# Patient Record
Sex: Female | Born: 1935
Health system: Southern US, Community
[De-identification: ages and names within clinical notes are randomized; demographics above are authoritative.]

## PROBLEM LIST (undated history)

## (undated) DIAGNOSIS — I4891 Unspecified atrial fibrillation: Secondary | ICD-10-CM

## (undated) DIAGNOSIS — M1990 Unspecified osteoarthritis, unspecified site: Secondary | ICD-10-CM

## (undated) DIAGNOSIS — I519 Heart disease, unspecified: Secondary | ICD-10-CM

## (undated) DIAGNOSIS — Z66 Do not resuscitate: Secondary | ICD-10-CM

## (undated) DIAGNOSIS — E119 Type 2 diabetes mellitus without complications: Secondary | ICD-10-CM

## (undated) DIAGNOSIS — R0600 Dyspnea, unspecified: Secondary | ICD-10-CM

## (undated) DIAGNOSIS — I2510 Atherosclerotic heart disease of native coronary artery without angina pectoris: Secondary | ICD-10-CM

## (undated) DIAGNOSIS — E039 Hypothyroidism, unspecified: Secondary | ICD-10-CM

## (undated) DIAGNOSIS — N189 Chronic kidney disease, unspecified: Secondary | ICD-10-CM

## (undated) DIAGNOSIS — K5792 Diverticulitis of intestine, part unspecified, without perforation or abscess without bleeding: Secondary | ICD-10-CM

## (undated) DIAGNOSIS — I499 Cardiac arrhythmia, unspecified: Secondary | ICD-10-CM

## (undated) DIAGNOSIS — I219 Acute myocardial infarction, unspecified: Secondary | ICD-10-CM

## (undated) DIAGNOSIS — I509 Heart failure, unspecified: Secondary | ICD-10-CM

## (undated) DIAGNOSIS — I10 Essential (primary) hypertension: Secondary | ICD-10-CM

## (undated) DIAGNOSIS — E079 Disorder of thyroid, unspecified: Secondary | ICD-10-CM

## (undated) DIAGNOSIS — Z515 Encounter for palliative care: Secondary | ICD-10-CM

## (undated) DIAGNOSIS — I480 Paroxysmal atrial fibrillation: Secondary | ICD-10-CM

## (undated) HISTORY — PX: TONSILLECTOMY: SUR1361

## (undated) HISTORY — DX: Heart failure, unspecified: I50.9

## (undated) HISTORY — DX: Diverticulitis of intestine, part unspecified, without perforation or abscess without bleeding: K57.92

## (undated) HISTORY — DX: Do not resuscitate: Z66

## (undated) HISTORY — DX: Encounter for palliative care: Z51.5

## (undated) HISTORY — DX: Type 2 diabetes mellitus without complications: E11.9

## (undated) HISTORY — DX: Essential (primary) hypertension: I10

## (undated) HISTORY — DX: Chronic kidney disease, unspecified: N18.9

## (undated) HISTORY — DX: Dyspnea, unspecified: R06.00

## (undated) HISTORY — DX: Disorder of thyroid, unspecified: E07.9

## (undated) HISTORY — DX: Unspecified atrial fibrillation: I48.91

## (undated) HISTORY — DX: Unspecified osteoarthritis, unspecified site: M19.90

---
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
## 2018-08-09 ENCOUNTER — Ambulatory Visit: Payer: Self-pay | Admitting: Family Medicine

## 2018-08-16 ENCOUNTER — Ambulatory Visit (INDEPENDENT_AMBULATORY_CARE_PROVIDER_SITE_OTHER): Payer: Medicare Other | Admitting: Family Medicine

## 2018-08-16 ENCOUNTER — Other Ambulatory Visit: Payer: Self-pay

## 2018-08-16 ENCOUNTER — Encounter: Payer: Self-pay | Admitting: General Practice

## 2018-08-16 ENCOUNTER — Encounter: Payer: Self-pay | Admitting: Family Medicine

## 2018-08-16 DIAGNOSIS — E785 Hyperlipidemia, unspecified: Secondary | ICD-10-CM

## 2018-08-16 DIAGNOSIS — I10 Essential (primary) hypertension: Secondary | ICD-10-CM

## 2018-08-16 DIAGNOSIS — I2510 Atherosclerotic heart disease of native coronary artery without angina pectoris: Secondary | ICD-10-CM

## 2018-08-16 DIAGNOSIS — E1159 Type 2 diabetes mellitus with other circulatory complications: Secondary | ICD-10-CM | POA: Insufficient documentation

## 2018-08-16 DIAGNOSIS — I252 Old myocardial infarction: Secondary | ICD-10-CM

## 2018-08-16 DIAGNOSIS — E1169 Type 2 diabetes mellitus with other specified complication: Secondary | ICD-10-CM

## 2018-08-16 DIAGNOSIS — E039 Hypothyroidism, unspecified: Secondary | ICD-10-CM

## 2018-08-16 HISTORY — DX: Hypothyroidism, unspecified: E03.9

## 2018-08-16 HISTORY — DX: Essential (primary) hypertension: I10

## 2018-08-16 HISTORY — DX: Old myocardial infarction: I25.2

## 2018-08-16 HISTORY — DX: Atherosclerotic heart disease of native coronary artery without angina pectoris: I25.10

## 2018-08-16 HISTORY — DX: Type 2 diabetes mellitus with other specified complication: E11.69

## 2018-08-16 HISTORY — DX: Type 2 diabetes mellitus with other circulatory complications: E11.59

## 2018-08-16 LAB — CBC WITH DIFFERENTIAL/PLATELET
Basophils Absolute: 0.1 10*3/uL (ref 0.0–0.1)
Basophils Relative: 0.8 % (ref 0.0–3.0)
Eosinophils Absolute: 0.2 10*3/uL (ref 0.0–0.7)
Eosinophils Relative: 3.1 % (ref 0.0–5.0)
HCT: 32 % — ABNORMAL LOW (ref 36.0–46.0)
Hemoglobin: 10.6 g/dL — ABNORMAL LOW (ref 12.0–15.0)
Lymphocytes Relative: 22.6 % (ref 12.0–46.0)
Lymphs Abs: 1.6 10*3/uL (ref 0.7–4.0)
MCHC: 33.2 g/dL (ref 30.0–36.0)
MCV: 91.6 fl (ref 78.0–100.0)
Monocytes Absolute: 0.6 10*3/uL (ref 0.1–1.0)
Monocytes Relative: 8.8 % (ref 3.0–12.0)
Neutro Abs: 4.6 10*3/uL (ref 1.4–7.7)
Neutrophils Relative %: 64.7 % (ref 43.0–77.0)
Platelets: 264 10*3/uL (ref 150.0–400.0)
RBC: 3.49 Mil/uL — ABNORMAL LOW (ref 3.87–5.11)
RDW: 13.4 % (ref 11.5–15.5)
WBC: 7.1 10*3/uL (ref 4.0–10.5)

## 2018-08-16 LAB — TSH: TSH: 2.18 u[IU]/mL (ref 0.35–4.50)

## 2018-08-16 LAB — HEMOGLOBIN A1C: Hgb A1c MFr Bld: 5.6 % (ref 4.6–6.5)

## 2018-08-16 NOTE — Progress Notes (Signed)
   Subjective:    Patient ID: Lauren Walker, female    DOB: 1929/10/12, 85 y.o.   MRN: [id number]  HPI New to establish.  In process of relocating from [name].  Hyperlipidemia- chronic problem, on Atorvastatin 40, Fenofibrate 160mg  daily.  Denies abd pain, N/V.  Hypothyroid- chronic problem, on Levothyroxine 163mcg daily.  Denies excessive fatigue, changes to skin/hair/nails.  DM- chronic problem, on Glimepiride 0.5 mg daily.  Last A1C was '5.6 or 5.7'.  Last done in March. No visual changes.  Denies symptomatic lows.  No numbness/tingling of hands/feet.  On ACE for renal protection.  UTD on eye exam (November)  HTN- chronic problem, on Coreg 6.25mg  BID, Lasix 20mg  daily, Hydralazine 50mg  BID, Imdur 30mg  daily, Quinapril 10mg  BID  Pt reports she has had multiple issues w/ finding the right combination of pills.  Denies CP, SOB above baseline, HAs, edema.  Hx of MI- pt is on ASA and eliquis.  On beta blocker, statin, ACE, Lasix.  Pt needs local Cards.   Review of Systems For ROS see HPI     Objective:   Physical Exam Vitals signs reviewed.  Constitutional:      General: She is not in acute distress.    Appearance: She is well-developed. She is obese.  HENT:     Head: Normocephalic and atraumatic.  Eyes:     Conjunctiva/sclera: Conjunctivae normal.     Pupils: Pupils are equal, round, and reactive to light.  Neck:     Musculoskeletal: Normal range of motion and neck supple.     Thyroid: No thyromegaly.  Cardiovascular:     Rate and Rhythm: Normal rate and regular rhythm.     Heart sounds: No murmur.     Comments: Distant heart sounds Pulmonary:     Effort: Pulmonary effort is normal. No respiratory distress.     Breath sounds: Normal breath sounds.  Abdominal:     General: There is no distension.     Palpations: Abdomen is soft.     Tenderness: There is no abdominal tenderness.  Musculoskeletal:     Right lower leg: Edema (1+ pitting edema) present.     Left lower leg: Edema  (1+ pitting edema) present.  Lymphadenopathy:     Cervical: No cervical adenopathy.  Skin:    General: Skin is warm and dry.  Neurological:     Mental Status: She is alert and oriented to person, place, and time.  Psychiatric:        Behavior: Behavior normal.           Assessment & Plan:

## 2018-08-16 NOTE — Assessment & Plan Note (Signed)
New to provider, ongoing for pt.  BP is adequately controlled today given her advanced age.  Check labs.  No anticipated med changes.  Will follow.

## 2018-08-16 NOTE — Patient Instructions (Signed)
Follow up in 3-4 months to recheck diabetes We'll notify you of your lab results and make any changes if needed No med changes at this time Continue to work on healthy diet- you can do it! We'll call you with your cardiology appt Call with any questions or concerns Welcome!  We're glad to have you! Stay Safe!!!

## 2018-08-16 NOTE — Assessment & Plan Note (Signed)
Chronic problem.  Pt is doing well on Lipitor 40mg  and Fenofibrate 160mg  daily.  Currently asymptomatic.  Check labs.  Adjust meds prn

## 2018-08-16 NOTE — Assessment & Plan Note (Signed)
New to provider, ongoing for pt.  Pt reports this is well controlled on 0.5mg  Glimepiride daily.  In fact, based on her reported A1C, she may not require meds at all.  UTD on eye exam.  On ACE for renal protection.  Foot exam done today.  Check labs.  Adjust meds prn

## 2018-08-16 NOTE — Assessment & Plan Note (Signed)
New to provider, ongoing for pt.  S/P MI.  On ASA and Eliquis.  On beta blocker and statin.  Needs local cards since she is relocating from [place].  Referral placed.  Pt currently asymptomatic.

## 2018-08-16 NOTE — Assessment & Plan Note (Signed)
New to provider, ongoing for pt.  Currently asymptomatic.  Check labs.  Adjust meds prn

## 2018-08-16 NOTE — Assessment & Plan Note (Signed)
New.  Pt is on ASA and Eliquis.  On beta blocker and statin.  Refer to local cards.  Pt expressed understanding and is in agreement w/ plan.

## 2018-08-17 LAB — BASIC METABOLIC PANEL
BUN: 55 mg/dL — ABNORMAL HIGH (ref 6–23)
CO2: 27 mEq/L (ref 19–32)
Calcium: 8.8 mg/dL (ref 8.4–10.5)
Chloride: 102 mEq/L (ref 96–112)
Creatinine, Ser: 2.23 mg/dL — ABNORMAL HIGH (ref 0.40–1.20)
GFR: 20.77 mL/min — ABNORMAL LOW (ref >60.00)
Glucose, Bld: 95 mg/dL (ref 70–99)
Potassium: 4.3 mEq/L (ref 3.5–5.1)
Sodium: 139 mEq/L (ref 135–145)

## 2018-08-17 LAB — HEPATIC FUNCTION PANEL
ALT: 12 U/L (ref 0–35)
AST: 19 U/L (ref 0–37)
Albumin: 3.6 g/dL (ref 3.5–5.2)
Alkaline Phosphatase: 20 U/L — ABNORMAL LOW (ref 39–117)
Bilirubin, Direct: 0.1 mg/dL (ref 0.0–0.3)
Total Bilirubin: 0.3 mg/dL (ref 0.2–1.2)
Total Protein: 5.8 g/dL — ABNORMAL LOW (ref 6.0–8.3)

## 2018-08-17 LAB — LIPID PANEL
Cholesterol: 107 mg/dL (ref 0–200)
HDL: 43 mg/dL (ref >39.00)
LDL Cholesterol: 40 mg/dL (ref 0–99)
NonHDL: 64.03
Total CHOL/HDL Ratio: 2
Triglycerides: 122 mg/dL (ref 0.0–149.0)
VLDL: 24.4 mg/dL (ref 0.0–40.0)

## 2018-08-17 NOTE — Progress Notes (Signed)
Called pt and lmovm to return call.    [place] for [facility] to Discuss results / PCP recommendations / Schedule patient.

## 2018-08-24 ENCOUNTER — Encounter: Payer: Self-pay | Admitting: General Practice

## 2018-08-24 ENCOUNTER — Other Ambulatory Visit: Payer: Self-pay | Admitting: Family Medicine

## 2018-08-24 DIAGNOSIS — R799 Abnormal finding of blood chemistry, unspecified: Secondary | ICD-10-CM

## 2018-08-24 DIAGNOSIS — R7989 Other specified abnormal findings of blood chemistry: Secondary | ICD-10-CM

## 2018-10-11 ENCOUNTER — Encounter: Payer: Self-pay | Admitting: Cardiology

## 2018-10-11 ENCOUNTER — Other Ambulatory Visit: Payer: Self-pay

## 2018-10-11 ENCOUNTER — Ambulatory Visit (INDEPENDENT_AMBULATORY_CARE_PROVIDER_SITE_OTHER): Payer: Medicare Other | Admitting: Cardiology

## 2018-10-11 VITALS — BP 130/74 | HR 55 | Ht 64.0 in | Wt 177.0 lb

## 2018-10-11 DIAGNOSIS — E1159 Type 2 diabetes mellitus with other circulatory complications: Secondary | ICD-10-CM | POA: Diagnosis not present

## 2018-10-11 DIAGNOSIS — I2510 Atherosclerotic heart disease of native coronary artery without angina pectoris: Secondary | ICD-10-CM | POA: Diagnosis not present

## 2018-10-11 DIAGNOSIS — I252 Old myocardial infarction: Secondary | ICD-10-CM

## 2018-10-11 DIAGNOSIS — I10 Essential (primary) hypertension: Secondary | ICD-10-CM | POA: Diagnosis not present

## 2018-10-11 DIAGNOSIS — I480 Paroxysmal atrial fibrillation: Secondary | ICD-10-CM

## 2018-10-11 NOTE — Progress Notes (Signed)
Cardiology Consultation:    Date:  10/11/2018   ID:  Lauren Walker, DOB March 05, 1929, MRN [id number]  PCP:  Midge Minium, MD  Cardiologist:  Jenne Campus, MD   Referring MD: Midge Minium, MD   Chief Complaint  Patient presents with  . History of MI    12/2014  . Coronary Artery Disease  . Edema    Bilateral legs, going on for years    History of Present Illness:    Lauren Walker is a 85 y.o. female who is being seen today for the evaluation of history of MI at the request of Midge Minium, MD.  In 2016 she went to vault for election she started having heartburn eventually few days later she went to her primary care physician she was told to have a heart attack, she was transferred to the hospital after that cardiac catheterization was done.  She was told to have occlusion of the 1 of the artery going to the bottom of his heart with collateralization.  No intervention was done since that time she seems to be doing fine however, she complained of having swelling of lower extremities.  No more chest pain/heartburn, does have some exertional shortness of breath.  Recently she relocated from [place] to area here.  She does have a dog to try to walk her dog on a regular basis.  She was also identified with kidney dysfunction.  She also some problem the kidney but now creatinine is more than quit smoking 30 years ago.  Does have hypertension does have diabetes as well as dyslipidemia.  Past Medical History:  Diagnosis Date  . Arrhythmia   . Arthritis   . Chronic kidney disease   . Diabetes mellitus without complication ([place])   . Diverticulitis   . Hypertension   . Thyroid disease     Past Surgical History:  Procedure Laterality Date  . APPENDECTOMY  1936    Current Medications: Current Meds  Medication Sig  . acetaminophen (TYLENOL) 650 MG CR tablet Take 650 mg by mouth every 8 (eight) hours as needed for pain.  Marland Kitchen apixaban (ELIQUIS) 2.5 MG TABS tablet  Take 2.5 mg by mouth 2 (two) times daily.  Marland Kitchen aspirin EC 81 MG tablet Take 81 mg by mouth daily.  Marland Kitchen atorvastatin (LIPITOR) 40 MG tablet Take 40 mg by mouth daily.  . Azelastine-Fluticasone 137-50 MCG/ACT SUSP Place 1 spray into the nose 2 (two) times daily as needed.  Marland Kitchen b complex vitamins capsule Take 1 capsule by mouth daily.  . Biotin 1 MG CAPS Take by mouth.  . carvedilol (COREG) 12.5 MG tablet Take 12.5 mg by mouth 2 (two) times daily with a meal.  . Cholecalciferol (VITAMIN D3) 50 MCG (2000 UT) TABS Take 1 capsule by mouth daily.  . fenofibrate 160 MG tablet Take 160 mg by mouth daily.  . Ferrous Sulfate (IRON) 325 (65 Fe) MG TABS Take 1 tablet by mouth daily.  . furosemide (LASIX) 20 MG tablet Take 20 mg by mouth.  . hydrALAZINE (APRESOLINE) 50 MG tablet Take 50 mg by mouth 2 (two) times a day.  . isosorbide mononitrate (IMDUR) 30 MG 24 hr tablet Take 30 mg by mouth daily.  Marland Kitchen levothyroxine (SYNTHROID) 150 MCG tablet Take 150 mcg by mouth daily before breakfast.  . magnesium oxide (MAG-OX) 400 MG tablet Take 400 mg by mouth daily.  . nitroGLYCERIN (NITROSTAT) 0.4 MG SL tablet Place 0.4 mg under the tongue every 5 (five) minutes  as needed for chest pain.  Marland Kitchen quinapril (ACCUPRIL) 10 MG tablet Take 10 mg by mouth 2 (two) times daily.  . vitamin C (ASCORBIC ACID) 500 MG tablet Take 500 mg by mouth daily.     Allergies:   Tape   Social History   Socioeconomic History  . Marital status: Married    Spouse name: Not on file  . Number of children: Not on file  . Years of education: Not on file  . Highest education level: Not on file  Occupational History  . Not on file  Social Needs  . Financial resource strain: Not on file  . Food insecurity    Worry: Not on file    Inability: Not on file  . Transportation needs    Medical: Not on file    Non-medical: Not on file  Tobacco Use  . Smoking status: Former Research scientist (life sciences)  . Smokeless tobacco: Never Used  Substance and Sexual Activity  .  Alcohol use: Yes    Comment: Very rare  . Drug use: Never  . Sexual activity: Not Currently  Lifestyle  . Physical activity    Days per week: Not on file    Minutes per session: Not on file  . Stress: Not on file  Relationships  . Social Herbalist on phone: Not on file    Gets together: Not on file    Attends religious service: Not on file    Active member of club or organization: Not on file    Attends meetings of clubs or organizations: Not on file    Relationship status: Not on file  Other Topics Concern  . Not on file  Social History Narrative  . Not on file     Family History: The patient's family history includes Arthritis in her daughter, father, mother, and son; Depression in her maternal aunt; Heart attack in her father; Heart disease in her father and mother; Hyperlipidemia in her maternal aunt; Hypertension in her maternal aunt and mother. ROS:   Please see the history of present illness.    All 14 point review of systems negative except as described per history of present illness.  EKGs/Labs/Other Studies Reviewed:    The following studies were reviewed today:     Recent Labs: 08/16/2018: ALT 12; BUN 55; Creatinine, Ser 2.23; Hemoglobin 10.6; Platelets 264.0; Potassium 4.3; Sodium 139; TSH 2.18  Recent Lipid Panel    Component Value Date/Time   CHOL 107 08/16/2018 1214   TRIG 122.0 08/16/2018 1214   HDL 43.00 08/16/2018 1214   CHOLHDL 2 08/16/2018 1214   VLDL 24.4 08/16/2018 1214   LDLCALC 40 08/16/2018 1214    Physical Exam:    VS:  BP 130/74   Pulse (!) 55   Ht 5\' 4"  (1.626 m)   Wt 177 lb (80.3 kg)   SpO2 98%   BMI 30.38 kg/m     Wt Readings from Last 3 Encounters:  10/11/18 177 lb (80.3 kg)  08/16/18 182 lb (82.6 kg)     GEN:  Well nourished, well developed in no acute distress HEENT: Normal NECK: No JVD; No carotid bruits LYMPHATICS: No lymphadenopathy CARDIAC: RRR, systolic ejection murmur grade 1/6 to 2/6 best heard at  the right upper portion of the sternum as well as holosystolic murmur best heard left border of the sternum grade 1/6 to 2/6., no rubs, no gallops RESPIRATORY:  Clear to auscultation without rales, wheezing or rhonchi  ABDOMEN: Soft, non-tender, non-distended  MUSCULOSKELETAL:  No edema; No deformity  SKIN: Warm and dry NEUROLOGIC:  Alert and oriented x 3 PSYCHIATRIC:  Normal affect   ASSESSMENT:    1. Coronary artery disease involving native coronary artery of native heart without angina pectoris   2. Essential hypertension   3. History of MI (myocardial infarction)   4. Type 2 diabetes mellitus with other circulatory complications (HCC)   5. Paroxysmal atrial fibrillation (HCC)    PLAN:    In order of problems listed above:  1. Coronary disease status post myocardial infarction 2016.  Will get EKG today, will do echocardiogram to assess left ventricle ejection fraction.  She is on appropriate medication which I will continue and change for now.  She is on aspirin.  And if she has no recent coronary event within last year aspirin is not indicated.  Will wait with decision regarding aspirin after we get echocardiogram.  I will not change any of her medication otherwise. 2. Essential hypertension blood pressure well controlled continue present management. 3. Type 2 diabetes recent hemoglobin A1c was less than 6.  Medication has been modified.  She is doing well. 4. Paroxysmal atrial fibrillation described to have rare palpitations.  She is anticoagulated which I will continue her chads 2 Vascor equals 4.  I will ask her to have an echocardiogram done to look also of the atrial size.  Based on that we will decide if we need to pursue a monitor to decide if we need to have some antiarrhythmic therapy.  However, overall she is not having any palpitations. 5. Heart murmur it looks like she does have aortic stenosis as well as mitral regurgitation.  Echocardiogram will be done to assess that    Medication Adjustments/Labs and Tests Ordered: Current medicines are reviewed at length with the patient today.  Concerns regarding medicines are outlined above.  No orders of the defined types were placed in this encounter.  No orders of the defined types were placed in this encounter.   Signed, Park Liter, MD, [facility]. 10/11/2018 11:06 AM    [place]

## 2018-10-11 NOTE — Patient Instructions (Signed)
Medication Instructions:  Your physician recommends that you continue on your current medications as directed. Please refer to the Current Medication list given to you today.  If you need a refill on your cardiac medications before your next appointment, please call your pharmacy.   Lab work: None.  If you have labs (blood work) drawn today and your tests are completely normal, you will receive your results only by: Marland Kitchen MyChart Message (if you have MyChart) OR . A paper copy in the mail If you have any lab test that is abnormal or we need to change your treatment, we will call you to review the results.  Testing/Procedures: Your physician has requested that you have an echocardiogram. Echocardiography is a painless test that uses sound waves to create images of your heart. It provides your doctor with information about the size and shape of your heart and how well your heart's chambers and valves are working. This procedure takes approximately one hour. There are no restrictions for this procedure.    Follow-Up: At [facility], you and your health needs are our priority.  As part of our continuing mission to provide you with exceptional heart care, we have created designated Provider Care Teams.  These Care Teams include your primary Cardiologist (physician) and Advanced Practice Providers (APPs -  Physician Assistants and Nurse Practitioners) who all work together to provide you with the care you need, when you need it. You will need a follow up appointment in 1 months.  Please call our office 2 months in advance to schedule this appointment.  You may see No primary care provider on file. or another member of our [employer] Provider Team in [place]: Shirlee More, MD . Jyl Heinz, MD  Any Other Special Instructions Will Be Listed Below (If Applicable).   Echocardiogram An echocardiogram is a procedure that uses painless sound waves (ultrasound) to produce an image of the  heart. Images from an echocardiogram can provide important information about:  Signs of coronary artery disease (CAD).  Aneurysm detection. An aneurysm is a weak or damaged part of an artery wall that bulges out from the normal force of blood pumping through the body.  Heart size and shape. Changes in the size or shape of the heart can be associated with certain conditions, including heart failure, aneurysm, and CAD.  Heart muscle function.  Heart valve function.  Signs of a past heart attack.  Fluid buildup around the heart.  Thickening of the heart muscle.  A tumor or infectious growth around the heart valves. Tell a health care provider about:  Any allergies you have.  All medicines you are taking, including vitamins, herbs, eye drops, creams, and over-the-counter medicines.  Any blood disorders you have.  Any surgeries you have had.  Any medical conditions you have.  Whether you are pregnant or may be pregnant. What are the risks? Generally, this is a safe procedure. However, problems may occur, including:  Allergic reaction to dye (contrast) that may be used during the procedure. What happens before the procedure? No specific preparation is needed. You may eat and drink normally. What happens during the procedure?   An IV tube may be inserted into one of your veins.  You may receive contrast through this tube. A contrast is an injection that improves the quality of the pictures from your heart.  A gel will be applied to your chest.  A wand-like tool (transducer) will be moved over your chest. The gel will help  to transmit the sound waves from the transducer.  The sound waves will harmlessly bounce off of your heart to allow the heart images to be captured in real-time motion. The images will be recorded on a computer. The procedure may vary among health care providers and hospitals. What happens after the procedure?  You may return to your normal, everyday  life, including diet, activities, and medicines, unless your health care provider tells you not to do that. Summary  An echocardiogram is a procedure that uses painless sound waves (ultrasound) to produce an image of the heart.  Images from an echocardiogram can provide important information about the size and shape of your heart, heart muscle function, heart valve function, and fluid buildup around your heart.  You do not need to do anything to prepare before this procedure. You may eat and drink normally.  After the echocardiogram is completed, you may return to your normal, everyday life, unless your health care provider tells you not to do that. This information is not intended to replace advice given to you by your health care provider. Make sure you discuss any questions you have with your health care provider. Document Released: 02/01/2000 Document Revised: 05/27/2018 Document Reviewed: 03/08/2016 Elsevier Patient Education  2020 [employer].  '

## 2018-10-12 ENCOUNTER — Ambulatory Visit (HOSPITAL_BASED_OUTPATIENT_CLINIC_OR_DEPARTMENT_OTHER)
Admission: RE | Admit: 2018-10-12 | Discharge: 2018-10-12 | Disposition: A | Discharge status: Home / Self Care | Payer: Medicare Other | Source: Ambulatory Visit | Attending: Cardiology | Admitting: Cardiology

## 2018-10-12 DIAGNOSIS — I480 Paroxysmal atrial fibrillation: Secondary | ICD-10-CM | POA: Insufficient documentation

## 2018-10-12 DIAGNOSIS — I252 Old myocardial infarction: Secondary | ICD-10-CM | POA: Diagnosis not present

## 2018-10-12 DIAGNOSIS — I2510 Atherosclerotic heart disease of native coronary artery without angina pectoris: Secondary | ICD-10-CM | POA: Insufficient documentation

## 2018-10-12 NOTE — Progress Notes (Signed)
  Echocardiogram 2D Echocardiogram has been performed.  Lauren Walker 10/12/2018, 11:55 AM

## 2018-11-04 ENCOUNTER — Ambulatory Visit (INDEPENDENT_AMBULATORY_CARE_PROVIDER_SITE_OTHER): Payer: Medicare Other | Admitting: Cardiology

## 2018-11-04 ENCOUNTER — Encounter: Payer: Self-pay | Admitting: Cardiology

## 2018-11-04 ENCOUNTER — Other Ambulatory Visit: Payer: Self-pay | Admitting: Nephrology

## 2018-11-04 ENCOUNTER — Other Ambulatory Visit: Payer: Self-pay

## 2018-11-04 VITALS — BP 170/62 | HR 63 | Ht 64.0 in | Wt 178.0 lb

## 2018-11-04 DIAGNOSIS — I340 Nonrheumatic mitral (valve) insufficiency: Secondary | ICD-10-CM

## 2018-11-04 DIAGNOSIS — I480 Paroxysmal atrial fibrillation: Secondary | ICD-10-CM | POA: Diagnosis not present

## 2018-11-04 DIAGNOSIS — I10 Essential (primary) hypertension: Secondary | ICD-10-CM | POA: Diagnosis not present

## 2018-11-04 DIAGNOSIS — N184 Chronic kidney disease, stage 4 (severe): Secondary | ICD-10-CM

## 2018-11-04 DIAGNOSIS — I2510 Atherosclerotic heart disease of native coronary artery without angina pectoris: Secondary | ICD-10-CM

## 2018-11-04 HISTORY — DX: Nonrheumatic mitral (valve) insufficiency: I34.0

## 2018-11-04 MED ORDER — HYDRALAZINE HCL 50 MG PO TABS: 50.0000 mg | ORAL_TABLET | Freq: Three times a day (TID) | ORAL | 2 refills | Status: DC

## 2018-11-04 NOTE — Patient Instructions (Signed)
Medication Instructions:  Your physician has recommended you make the following change in your medication:   Increase: Hydralazine to 50 mg three times daily.    If you need a refill on your cardiac medications before your next appointment, please call your pharmacy.   Lab work: None.  If you have labs (blood work) drawn today and your tests are completely normal, you will receive your results only by: Marland Kitchen MyChart Message (if you have MyChart) OR . A paper copy in the mail If you have any lab test that is abnormal or we need to change your treatment, we will call you to review the results.  Testing/Procedures: None  Follow-Up: At [facility], you and your health needs are our priority.  As part of our continuing mission to provide you with exceptional heart care, we have created designated Provider Care Teams.  These Care Teams include your primary Cardiologist (physician) and Advanced Practice Providers (APPs -  Physician Assistants and Nurse Practitioners) who all work together to provide you with the care you need, when you need it. You will need a follow up appointment in 3 months.  Please call our office 2 months in advance to schedule this appointment.  You may see No primary care provider on file. or another member of our [employer] Provider Team in [place]: Shirlee More, MD . Jyl Heinz, MD  Any Other Special Instructions Will Be Listed Below (If Applicable).

## 2018-11-04 NOTE — Progress Notes (Signed)
Cardiology Office Note:    Date:  11/04/2018   ID:  Lauren Walker, DOB May 04, 1929, MRN [id number]  PCP:  Midge Minium, MD  Cardiologist:  Jenne Campus, MD    Referring MD: Midge Minium, MD   Chief Complaint  Patient presents with  . Follow-up  Doing well  History of Present Illness:    Lauren Walker is a 85 y.o. female with history of coronary artery disease apparently years ago she did have a small non-STEMI cardiac catheterization was done she was told to have completely occluded artery with good collateralization no intervention has been done.  She is originally from [place].  She relocated to our area recently.  She would like to be established as a patient.  On last physical examination 2 heart murmurs were heard 1 aortic stenosis, mitral regurgitation.  Echocardiogram being done and she is following up today on results of this test.  Echocardiogram showed preserved left ventricle ejection fraction, she does have moderate to severe mitral regurgitation, also mild aortic stenosis.  Overall she seems to be doing well denies having any chest pain tightness squeezing pressure burning chest she does have a dog that she walks on a regular basis she has no difficulty doing it.  Past Medical History:  Diagnosis Date  . Arrhythmia   . Arthritis   . Chronic kidney disease   . Diabetes mellitus without complication ([place])   . Diverticulitis   . Hypertension   . Thyroid disease     Past Surgical History:  Procedure Laterality Date  . APPENDECTOMY  1936    Current Medications: Current Meds  Medication Sig  . acetaminophen (TYLENOL) 650 MG CR tablet Take 650 mg by mouth every 8 (eight) hours as needed for pain.  Marland Kitchen apixaban (ELIQUIS) 2.5 MG TABS tablet Take 2.5 mg by mouth 2 (two) times daily.  Marland Kitchen aspirin EC 81 MG tablet Take 81 mg by mouth daily.  Marland Kitchen atorvastatin (LIPITOR) 40 MG tablet Take 40 mg by mouth daily.  . Azelastine-Fluticasone 137-50 MCG/ACT SUSP Place 1  spray into the nose 2 (two) times daily as needed.  Marland Kitchen b complex vitamins capsule Take 1 capsule by mouth daily.  . Biotin 1 MG CAPS Take by mouth.  . carvedilol (COREG) 12.5 MG tablet Take 12.5 mg by mouth 2 (two) times daily with a meal.  . Cholecalciferol (VITAMIN D3) 50 MCG (2000 UT) TABS Take 1 capsule by mouth daily.  . fenofibrate 160 MG tablet Take 160 mg by mouth daily.  . Ferrous Sulfate (IRON) 325 (65 Fe) MG TABS Take 1 tablet by mouth daily.  . furosemide (LASIX) 20 MG tablet Take 20 mg by mouth.  . hydrALAZINE (APRESOLINE) 50 MG tablet Take 50 mg by mouth 2 (two) times a day.  . isosorbide mononitrate (IMDUR) 30 MG 24 hr tablet Take 30 mg by mouth daily.  Marland Kitchen levothyroxine (SYNTHROID) 150 MCG tablet Take 150 mcg by mouth daily before breakfast.  . magnesium oxide (MAG-OX) 400 MG tablet Take 400 mg by mouth daily.  . nitroGLYCERIN (NITROSTAT) 0.4 MG SL tablet Place 0.4 mg under the tongue every 5 (five) minutes as needed for chest pain.  Marland Kitchen quinapril (ACCUPRIL) 10 MG tablet Take 10 mg by mouth 2 (two) times daily.  . vitamin C (ASCORBIC ACID) 500 MG tablet Take 500 mg by mouth daily.     Allergies:   Tape   Social History   Socioeconomic History  . Marital status: Married  Spouse name: Not on file  . Number of children: Not on file  . Years of education: Not on file  . Highest education level: Not on file  Occupational History  . Not on file  Social Needs  . Financial resource strain: Not on file  . Food insecurity    Worry: Not on file    Inability: Not on file  . Transportation needs    Medical: Not on file    Non-medical: Not on file  Tobacco Use  . Smoking status: Former Research scientist (life sciences)  . Smokeless tobacco: Never Used  Substance and Sexual Activity  . Alcohol use: Yes    Comment: Very rare  . Drug use: Never  . Sexual activity: Not Currently  Lifestyle  . Physical activity    Days per week: Not on file    Minutes per session: Not on file  . Stress: Not on file   Relationships  . Social Herbalist on phone: Not on file    Gets together: Not on file    Attends religious service: Not on file    Active member of club or organization: Not on file    Attends meetings of clubs or organizations: Not on file    Relationship status: Not on file  Other Topics Concern  . Not on file  Social History Narrative  . Not on file     Family History: The patient's family history includes Arthritis in her daughter, father, mother, and son; Depression in her maternal aunt; Heart attack in her father; Heart disease in her father and mother; Hyperlipidemia in her maternal aunt; Hypertension in her maternal aunt and mother. ROS:   Please see the history of present illness.    All 14 point review of systems negative except as described per history of present illness  EKGs/Labs/Other Studies Reviewed:      Recent Labs: 08/16/2018: ALT 12; BUN 55; Creatinine, Ser 2.23; Hemoglobin 10.6; Platelets 264.0; Potassium 4.3; Sodium 139; TSH 2.18  Recent Lipid Panel    Component Value Date/Time   CHOL 107 08/16/2018 1214   TRIG 122.0 08/16/2018 1214   HDL 43.00 08/16/2018 1214   CHOLHDL 2 08/16/2018 1214   VLDL 24.4 08/16/2018 1214   LDLCALC 40 08/16/2018 1214    Physical Exam:    VS:  BP (!) 150/66   Pulse 63   Ht 5\' 4"  (1.626 m)   Wt 178 lb (80.7 kg)   SpO2 98%   BMI 30.55 kg/m     Wt Readings from Last 3 Encounters:  11/04/18 178 lb (80.7 kg)  10/11/18 177 lb (80.3 kg)  08/16/18 182 lb (82.6 kg)     GEN:  Well nourished, well developed in no acute distress HEENT: Normal NECK: No JVD; No carotid bruits LYMPHATICS: No lymphadenopathy CARDIAC: RRR, systolic ejection murmur grade 2/6 best heard at the right upper portion of the sternum, also holosystolic murmur grade 3/6 best heard at the apex, no rubs, no gallops RESPIRATORY:  Clear to auscultation without rales, wheezing or rhonchi  ABDOMEN: Soft, non-tender, non-distended MUSCULOSKELETAL:   No edema; No deformity  SKIN: Warm and dry LOWER EXTREMITIES: no swelling NEUROLOGIC:  Alert and oriented x 3 PSYCHIATRIC:  Normal affect   ASSESSMENT:    1. Coronary artery disease involving native coronary artery of native heart without angina pectoris   2. Essential hypertension   3. Paroxysmal atrial fibrillation (HCC)   4. Nonrheumatic mitral valve regurgitation    PLAN:  In order of problems listed above:  1. Coronary disease stable denies having any symptoms.  Echocardiogram showed preserved left ventricular ejection fraction. 2. Essential hypertension I will increase dose of hydralazine to 50 mg 3 times a day hoping that I will be able to reduce her blood pressure and reduce also mitral regurgitation. 3. Paroxysmal atrial fibrillation denies having any palpitations.  She is anticoagulated will continue. 4. Nonrheumatic mitral valve regurgitation which assessed to be moderate to severe.  She is shocked with those findings we had a long discussion about what to do with the situation she is reluctant to do any aggressive approach for now.  Therefore we will simply watch this carefully.  I see her back in my office in about 3 months at that time she may require another echocardiogram.   Medication Adjustments/Labs and Tests Ordered: Current medicines are reviewed at length with the patient today.  Concerns regarding medicines are outlined above.  No orders of the defined types were placed in this encounter.  Medication changes: No orders of the defined types were placed in this encounter.   Signed, Park Liter, MD, [facility] 11/04/2018 11:45 AM    [place]

## 2018-11-05 ENCOUNTER — Telehealth: Payer: Self-pay | Admitting: Family Medicine

## 2018-11-05 NOTE — Telephone Encounter (Signed)
Yes.  They need to get their Tdap from the pharmacy

## 2018-11-05 NOTE — Telephone Encounter (Signed)
Due to age and medicare injection needs to come from pharmacy correct

## 2018-11-05 NOTE — Telephone Encounter (Signed)
Pt has a new great grand baby she wanted to know if Dr. Birdie Riddle recommended she get the whooping cough vaccine. Pt is scheduled for her flu shot on Monday and she can be reached at the home #

## 2018-11-05 NOTE — Telephone Encounter (Signed)
Pt informed that she needs to get Tdap from pharmacy. Stated an understanding.

## 2018-11-08 ENCOUNTER — Ambulatory Visit (INDEPENDENT_AMBULATORY_CARE_PROVIDER_SITE_OTHER): Payer: Medicare Other

## 2018-11-08 ENCOUNTER — Other Ambulatory Visit: Payer: Self-pay

## 2018-11-08 DIAGNOSIS — Z23 Encounter for immunization: Secondary | ICD-10-CM | POA: Diagnosis not present

## 2018-11-12 ENCOUNTER — Telehealth: Payer: Self-pay | Admitting: Family Medicine

## 2018-11-12 MED ORDER — FLUTICASONE PROPIONATE 50 MCG/ACT NA SUSP: 2.0000 | Freq: Every day | NASAL | 6 refills | Status: DC

## 2018-11-12 NOTE — Telephone Encounter (Signed)
Medication filled to pharmacy as requested.  Pt was informed.

## 2018-11-12 NOTE — Telephone Encounter (Signed)
Ok for [facility]- 2 sprays each nostril daily.  Disp 1, 11 refills

## 2018-11-12 NOTE — Telephone Encounter (Signed)
Please advise 

## 2018-11-12 NOTE — Telephone Encounter (Signed)
Pt called in stating that she and Dr. Birdie Riddle talked about changing her nasal spray from the Azelastine Fluticasone to something else. Pt uses [facility] at [address]. Pt can be reached at the home #

## 2018-11-15 ENCOUNTER — Telehealth: Payer: Self-pay | Admitting: Family Medicine

## 2018-11-15 DIAGNOSIS — H269 Unspecified cataract: Secondary | ICD-10-CM

## 2018-11-15 NOTE — Addendum Note (Signed)
Addended by: Davis Gourd on: 11/15/2018 10:09 AM   Modules accepted: Orders

## 2018-11-15 NOTE — Telephone Encounter (Signed)
[facility] for referral.  No preference at this time

## 2018-11-15 NOTE — Telephone Encounter (Signed)
Patient would like a referral to a ophthalmologist for her Cataracts. Patient is new to the area.

## 2018-11-15 NOTE — Telephone Encounter (Signed)
This referral was placed today.  

## 2018-11-15 NOTE — Telephone Encounter (Signed)
[facility] for referral? And do you have a provider preference

## 2018-11-18 ENCOUNTER — Other Ambulatory Visit: Payer: Self-pay | Admitting: Family Medicine

## 2018-11-18 DIAGNOSIS — H269 Unspecified cataract: Secondary | ICD-10-CM

## 2018-11-23 ENCOUNTER — Ambulatory Visit
Admission: RE | Admit: 2018-11-23 | Discharge: 2018-11-23 | Disposition: A | Discharge status: Home / Self Care | Payer: Medicare Other | Source: Ambulatory Visit | Attending: Nephrology | Admitting: Nephrology

## 2018-11-23 DIAGNOSIS — N184 Chronic kidney disease, stage 4 (severe): Secondary | ICD-10-CM

## 2018-12-04 ENCOUNTER — Encounter (HOSPITAL_COMMUNITY)
Admission: RE | Disposition: A | Discharge status: Home / Self Care | Payer: Self-pay | Source: Ambulatory Visit | Attending: Ophthalmology

## 2018-12-04 ENCOUNTER — Inpatient Hospital Stay (HOSPITAL_COMMUNITY): Payer: Medicare Other | Admitting: Anesthesiology

## 2018-12-04 ENCOUNTER — Other Ambulatory Visit (HOSPITAL_COMMUNITY): Payer: Self-pay | Admitting: *Deleted

## 2018-12-04 ENCOUNTER — Ambulatory Visit (HOSPITAL_COMMUNITY)
Admission: RE | Admit: 2018-12-04 | Discharge: 2018-12-04 | Disposition: A | Discharge status: Home / Self Care | Payer: Medicare Other | Source: Ambulatory Visit | Attending: Ophthalmology | Admitting: Ophthalmology

## 2018-12-04 ENCOUNTER — Other Ambulatory Visit (HOSPITAL_COMMUNITY)
Admission: RE | Admit: 2018-12-04 | Discharge: 2018-12-04 | Disposition: A | Discharge status: Home / Self Care | Payer: Medicare Other | Source: Ambulatory Visit | Attending: Ophthalmology | Admitting: Ophthalmology

## 2018-12-04 ENCOUNTER — Encounter (HOSPITAL_COMMUNITY): Payer: Self-pay | Admitting: Anesthesiology

## 2018-12-04 DIAGNOSIS — I129 Hypertensive chronic kidney disease with stage 1 through stage 4 chronic kidney disease, or unspecified chronic kidney disease: Secondary | ICD-10-CM | POA: Insufficient documentation

## 2018-12-04 DIAGNOSIS — I252 Old myocardial infarction: Secondary | ICD-10-CM | POA: Insufficient documentation

## 2018-12-04 DIAGNOSIS — H4311 Vitreous hemorrhage, right eye: Secondary | ICD-10-CM | POA: Insufficient documentation

## 2018-12-04 DIAGNOSIS — E1122 Type 2 diabetes mellitus with diabetic chronic kidney disease: Secondary | ICD-10-CM | POA: Diagnosis not present

## 2018-12-04 DIAGNOSIS — N189 Chronic kidney disease, unspecified: Secondary | ICD-10-CM | POA: Diagnosis not present

## 2018-12-04 DIAGNOSIS — H3321 Serous retinal detachment, right eye: Secondary | ICD-10-CM | POA: Diagnosis not present

## 2018-12-04 DIAGNOSIS — I2510 Atherosclerotic heart disease of native coronary artery without angina pectoris: Secondary | ICD-10-CM | POA: Insufficient documentation

## 2018-12-04 DIAGNOSIS — Z794 Long term (current) use of insulin: Secondary | ICD-10-CM | POA: Insufficient documentation

## 2018-12-04 DIAGNOSIS — Z87891 Personal history of nicotine dependence: Secondary | ICD-10-CM | POA: Insufficient documentation

## 2018-12-04 DIAGNOSIS — Z20828 Contact with and (suspected) exposure to other viral communicable diseases: Secondary | ICD-10-CM | POA: Insufficient documentation

## 2018-12-04 HISTORY — DX: Acute myocardial infarction, unspecified: I21.9

## 2018-12-04 HISTORY — PX: PARS PLANA VITRECTOMY: SHX2166

## 2018-12-04 HISTORY — DX: Atherosclerotic heart disease of native coronary artery without angina pectoris: I25.10

## 2018-12-04 HISTORY — DX: Hypothyroidism, unspecified: E03.9

## 2018-12-04 LAB — POCT I-STAT, CHEM 8
BUN: 36 mg/dL — ABNORMAL HIGH (ref 8–23)
Calcium, Ion: 1.16 mmol/L (ref 1.15–1.40)
Chloride: 104 mmol/L (ref 98–111)
Creatinine, Ser: 2 mg/dL — ABNORMAL HIGH (ref 0.44–1.00)
Glucose, Bld: 141 mg/dL — ABNORMAL HIGH (ref 70–99)
HCT: 33 % — ABNORMAL LOW (ref 36.0–46.0)
Hemoglobin: 11.2 g/dL — ABNORMAL LOW (ref 12.0–15.0)
Potassium: 4.4 mmol/L (ref 3.5–5.1)
Sodium: 141 mmol/L (ref 135–145)
TCO2: 25 mmol/L (ref 22–32)

## 2018-12-04 LAB — SARS CORONAVIRUS 2 (TAT 6-24 HRS): SARS Coronavirus 2: NEGATIVE

## 2018-12-04 LAB — GLUCOSE, CAPILLARY: Glucose-Capillary: 135 mg/dL — ABNORMAL HIGH (ref 70–99)

## 2018-12-04 SURGERY — PARS PLANA VITRECTOMY 25 GAUGE FOR ENDOPHTHALMITIS
Anesthesia: Monitor Anesthesia Care | Site: Eye | Laterality: Right

## 2018-12-04 MED ORDER — BSS IO SOLN
INTRAOCULAR | Status: DC | PRN
  Administered 2018-12-04: 15 mL

## 2018-12-04 MED ORDER — FENTANYL CITRATE (PF) 250 MCG/5ML IJ SOLN
INTRAMUSCULAR | Status: AC
  Filled 2018-12-04: qty 5

## 2018-12-04 MED ORDER — TOBRAMYCIN-DEXAMETHASONE 0.3-0.1 % OP OINT
TOPICAL_OINTMENT | OPHTHALMIC | Status: DC | PRN
  Administered 2018-12-04: 1 via OPHTHALMIC

## 2018-12-04 MED ORDER — HYPROMELLOSE (GONIOSCOPIC) 2.5 % OP SOLN
OPHTHALMIC | Status: DC | PRN
  Administered 2018-12-04: 1 [drp] via OPHTHALMIC

## 2018-12-04 MED ORDER — CEFAZOLIN SUBCONJUNCTIVAL INJECTION 100 MG/0.5 ML
100.0000 mg | INJECTION | SUBCONJUNCTIVAL | Status: DC
  Filled 2018-12-04: qty 5

## 2018-12-04 MED ORDER — EPINEPHRINE PF 1 MG/ML IJ SOLN
INTRAOCULAR | Status: DC | PRN
  Administered 2018-12-04: .3 mL

## 2018-12-04 MED ORDER — LABETALOL HCL 5 MG/ML IV SOLN
INTRAVENOUS | Status: AC
  Filled 2018-12-04: qty 4

## 2018-12-04 MED ORDER — FENTANYL CITRATE (PF) 100 MCG/2ML IJ SOLN
INTRAMUSCULAR | Status: DC | PRN
  Administered 2018-12-04: 25 ug via INTRAVENOUS

## 2018-12-04 MED ORDER — LABETALOL HCL 5 MG/ML IV SOLN: 10.0000 mg | INTRAVENOUS | Status: DC | PRN

## 2018-12-04 MED ORDER — HYDROMORPHONE HCL 1 MG/ML IJ SOLN: 0.2500 mg | INTRAMUSCULAR | Status: DC | PRN

## 2018-12-04 MED ORDER — PROPOFOL 10 MG/ML IV BOLUS
INTRAVENOUS | Status: AC
  Filled 2018-12-04: qty 20

## 2018-12-04 MED ORDER — LABETALOL HCL 5 MG/ML IV SOLN
10.0000 mg | Freq: Once | INTRAVENOUS | Status: AC
  Administered 2018-12-04: 10 mg via INTRAVENOUS
  Filled 2018-12-04 (×2): qty 4

## 2018-12-04 MED ORDER — PHENYLEPHRINE HCL 2.5 % OP SOLN
1.0000 [drp] | OPHTHALMIC | Status: AC | PRN
  Administered 2018-12-04 (×3): 1 [drp] via OPHTHALMIC
  Filled 2018-12-04: qty 2

## 2018-12-04 MED ORDER — PROPARACAINE HCL 0.5 % OP SOLN
1.0000 [drp] | OPHTHALMIC | Status: AC | PRN
  Administered 2018-12-04 (×2): 1 [drp] via OPHTHALMIC
  Filled 2018-12-04: qty 15

## 2018-12-04 MED ORDER — ONDANSETRON HCL 4 MG/2ML IJ SOLN: 4.0000 mg | Freq: Once | INTRAMUSCULAR | Status: DC | PRN

## 2018-12-04 MED ORDER — DEXAMETHASONE SODIUM PHOSPHATE 10 MG/ML IJ SOLN
INTRAMUSCULAR | Status: AC
  Filled 2018-12-04: qty 1

## 2018-12-04 MED ORDER — GATIFLOXACIN 0.5 % OP SOLN
1.0000 [drp] | OPHTHALMIC | Status: AC | PRN
  Administered 2018-12-04 (×3): 1 [drp] via OPHTHALMIC
  Filled 2018-12-04: qty 2.5

## 2018-12-04 MED ORDER — PROPOFOL 10 MG/ML IV BOLUS
INTRAVENOUS | Status: DC | PRN
  Administered 2018-12-04: 30 mg via INTRAVENOUS

## 2018-12-04 MED ORDER — DEXAMETHASONE SODIUM PHOSPHATE 10 MG/ML IJ SOLN
INTRAMUSCULAR | Status: DC | PRN
  Administered 2018-12-04: 10 mg

## 2018-12-04 MED ORDER — ONDANSETRON HCL 4 MG/2ML IJ SOLN
INTRAMUSCULAR | Status: DC | PRN
  Administered 2018-12-04: 4 mg via INTRAVENOUS

## 2018-12-04 MED ORDER — CYCLOPENTOLATE HCL 1 % OP SOLN
1.0000 [drp] | OPHTHALMIC | Status: AC | PRN
  Administered 2018-12-04 (×3): 1 [drp] via OPHTHALMIC
  Filled 2018-12-04: qty 2

## 2018-12-04 MED ORDER — LABETALOL HCL 5 MG/ML IV SOLN
INTRAVENOUS | Status: DC | PRN
  Administered 2018-12-04 (×2): 5 mg via INTRAVENOUS

## 2018-12-04 MED ORDER — LIDOCAINE HCL 2 % IJ SOLN
INTRAMUSCULAR | Status: DC | PRN
  Administered 2018-12-04: 6 mL via RETROBULBAR

## 2018-12-04 MED ORDER — LIDOCAINE HCL (CARDIAC) PF 100 MG/5ML IV SOSY
PREFILLED_SYRINGE | INTRAVENOUS | Status: DC | PRN
  Administered 2018-12-04: 30 mg via INTRATRACHEAL

## 2018-12-04 MED ORDER — LABETALOL HCL 5 MG/ML IV SOLN
5.0000 mg | INTRAVENOUS | Status: DC | PRN
  Administered 2018-12-04: 5 mg via INTRAVENOUS

## 2018-12-04 MED ORDER — CEFAZOLIN SUBCONJUNCTIVAL INJECTION 100 MG/0.5 ML
INJECTION | SUBCONJUNCTIVAL | Status: DC | PRN
  Administered 2018-12-04: 100 mg via SUBCONJUNCTIVAL

## 2018-12-04 MED ORDER — HYDRALAZINE HCL 50 MG PO TABS
50.0000 mg | ORAL_TABLET | ORAL | Status: AC
  Administered 2018-12-04: 50 mg via ORAL
  Filled 2018-12-04: qty 1

## 2018-12-04 MED ORDER — MEPERIDINE HCL 25 MG/ML IJ SOLN: 6.2500 mg | INTRAMUSCULAR | Status: DC | PRN

## 2018-12-04 MED ORDER — SODIUM CHLORIDE 0.9 % IV SOLN
INTRAVENOUS | Status: DC
  Administered 2018-12-04 (×2): via INTRAVENOUS

## 2018-12-04 SURGICAL SUPPLY — 59 items
APPLICATOR COTTON TIP 6 STRL (MISCELLANEOUS) ×1 IMPLANT
APPLICATOR COTTON TIP 6IN STRL (MISCELLANEOUS) ×3
BAND WRIST GAS GREEN (MISCELLANEOUS) IMPLANT
BLADE MVR KNIFE 20G (BLADE) IMPLANT
CANNULA ANT CHAM MAIN (OPHTHALMIC RELATED) IMPLANT
CANNULA DUAL BORE 23G (CANNULA) IMPLANT
CANNULA DUALBORE 25G (CANNULA) IMPLANT
CANNULA VLV SOFT TIP 25GA (OPHTHALMIC) ×3 IMPLANT
CAUTERY EYE LOW TEMP 1300F FIN (OPHTHALMIC RELATED) IMPLANT
CLOSURE STERI-STRIP 1/2X4 (GAUZE/BANDAGES/DRESSINGS) ×1
CLSR STERI-STRIP ANTIMIC 1/2X4 (GAUZE/BANDAGES/DRESSINGS) ×2 IMPLANT
CORD BIPOLAR FORCEPS 12FT (ELECTRODE) IMPLANT
COVER MAYO STAND STRL (DRAPES) IMPLANT
COVER WAND RF STERILE (DRAPES) ×3 IMPLANT
DRAPE HALF SHEET 40X57 (DRAPES) ×3 IMPLANT
DRAPE INCISE 51X51 W/FILM STRL (DRAPES) IMPLANT
DRAPE RETRACTOR (MISCELLANEOUS) ×3 IMPLANT
ERASER HMR WETFIELD 23G BP (MISCELLANEOUS) IMPLANT
FORCEPS ECKARDT ILM 25G SERR (OPHTHALMIC RELATED) IMPLANT
FORCEPS GRIESHABER ILM 25G A (INSTRUMENTS) IMPLANT
GAS AUTO FILL CONSTEL (OPHTHALMIC) ×3
GAS AUTO FILL CONSTELLATION (OPHTHALMIC) ×1 IMPLANT
GAS WRIST BAND GREEN (MISCELLANEOUS)
GLOVE SURG SYN 7.5  E (GLOVE) ×4
GLOVE SURG SYN 7.5 E (GLOVE) ×2 IMPLANT
GOWN STRL REUS W/ TWL LRG LVL3 (GOWN DISPOSABLE) ×3 IMPLANT
GOWN STRL REUS W/TWL LRG LVL3 (GOWN DISPOSABLE) ×6
HANDLE PNEUMATIC FOR CONSTEL (OPHTHALMIC) IMPLANT
KIT BASIN OR (CUSTOM PROCEDURE TRAY) ×3 IMPLANT
KIT TURNOVER KIT B (KITS) ×3 IMPLANT
LENS BIOM SUPER VIEW SET DISP (MISCELLANEOUS) ×3 IMPLANT
MICROPICK 25G (MISCELLANEOUS)
NEEDLE 18GX1X1/2 (RX/OR ONLY) (NEEDLE) ×3 IMPLANT
NEEDLE 25GX 5/8IN NON SAFETY (NEEDLE) ×3 IMPLANT
NEEDLE FILTER BLUNT 18X 1/2SAF (NEEDLE) ×6
NEEDLE FILTER BLUNT 18X1 1/2 (NEEDLE) ×3 IMPLANT
NEEDLE HYPO 25GX1X1/2 BEV (NEEDLE) IMPLANT
NEEDLE HYPO 30X.5 LL (NEEDLE) ×12 IMPLANT
NEEDLE RETROBULBAR 25GX1.5 (NEEDLE) IMPLANT
NS IRRIG 1000ML POUR BTL (IV SOLUTION) ×3 IMPLANT
PACK VITRECTOMY CUSTOM (CUSTOM PROCEDURE TRAY) ×3 IMPLANT
PAD ARMBOARD 7.5X6 YLW CONV (MISCELLANEOUS) ×6 IMPLANT
PAK PIK VITRECTOMY CVS 25GA (OPHTHALMIC) ×3 IMPLANT
PENCIL BIPOLAR 25GA STR DISP (OPHTHALMIC RELATED) IMPLANT
PICK MICROPICK 25G (MISCELLANEOUS) IMPLANT
PROBE LASER ILLUM FLEX CVD 25G (OPHTHALMIC) IMPLANT
ROLLS DENTAL (MISCELLANEOUS) IMPLANT
SCRAPER DIAMOND 25GA (OPHTHALMIC RELATED) IMPLANT
SOL ANTI FOG 6CC (MISCELLANEOUS) ×1 IMPLANT
SOLUTION ANTI FOG 6CC (MISCELLANEOUS) ×2
STOPCOCK 4 WAY LG BORE MALE ST (IV SETS) IMPLANT
SUT VICRYL 7 0 TG140 8 (SUTURE) ×3 IMPLANT
SYR 10ML LL (SYRINGE) ×3 IMPLANT
SYR 20ML LL LF (SYRINGE) ×3 IMPLANT
SYR 5ML LL (SYRINGE) ×3 IMPLANT
SYR TB 1ML LUER SLIP (SYRINGE) ×9 IMPLANT
TOWEL GREEN STERILE FF (TOWEL DISPOSABLE) ×3 IMPLANT
VITCUTTER PED ULTRA 25G SHORT (OPHTHALMIC) ×3 IMPLANT
WATER STERILE IRR 1000ML POUR (IV SOLUTION) ×3 IMPLANT

## 2018-12-04 NOTE — Anesthesia Procedure Notes (Signed)
Procedure Name: MAC Date/Time: 12/04/2018 4:31 PM Performed by: Eligha Bridegroom, CRNA Pre-anesthesia Checklist: Patient identified, Emergency Drugs available, Suction available, Patient being monitored and Timeout performed Patient Re-evaluated:Patient Re-evaluated prior to induction Oxygen Delivery Method: Nasal cannula Preoxygenation: Pre-oxygenation with 100% oxygen

## 2018-12-04 NOTE — Transfer of Care (Signed)
Immediate Anesthesia Transfer of Care Note  Patient: Lauren Walker  Procedure(s) Performed: RETINAL DETACHMENT REPAIR PPV 25 GAUGE WITH ENDO LASER AIR/FLUID EXCHANGE SF6 GAS INJECTION (Right Eye)  Patient Location: PACU  Anesthesia Type:MAC  Level of Consciousness: awake, alert  and oriented  Airway & Oxygen Therapy: Patient Spontanous Breathing  Post-op Assessment: Report given to RN and Post -op Vital signs reviewed and stable  Post vital signs: Reviewed and stable  Last Vitals:  Vitals Value Taken Time  BP 207/91 12/04/18 1743  Temp    Pulse 65 12/04/18 1751  Resp 14 12/04/18 1751  SpO2 96 % 12/04/18 1751  Vitals shown include unvalidated device data.  Last Pain:  Vitals:   12/04/18 1522  TempSrc: Oral  PainSc: 0-No pain      Patients Stated Pain Goal: 3 (95/18/84 1660)  Complications: No apparent anesthesia complications

## 2018-12-04 NOTE — Discharge Instructions (Signed)
DO NOT SLEEP ON BACK, THE EYE PRESSURE CAN GO UP AND CAUSE VISION LOSS   SLEEP ON SIDE WITH NOSE TO PILLOW  DURING DAY KEEP FACE DOWN.  15 MINUTES EVERY 2 HOURS IT IS OK TO LOOK STRAIGHT AHEAD (USE BATHROOM, EAT, WALK, ETC.)  

## 2018-12-04 NOTE — Op Note (Signed)
Lilla Callejo 12/04/2018 Diagnosis: Retinal detachment with vitreous hemorrhage right eye  Procedure: [place] Vitrectomy, Endolaser, Fluid Gas Exchange and SF6 gas tamponade for Retinal Detachment repair right eye Operative Eye:  right eye  Surgeon: Royston Cowper Estimated Blood Loss: minimal Specimens for Pathology:  None Complications: none   The  patient was prepped and draped in the usual fashion for ocular surgery on the  right eye .  A lid speculum was placed.  Infusion line and trocar was placed at the 8 o'clock position approximately 3.5 mm from the surgical limbus.   The infusion line was allowed to run and then clamped when placed at the cannula opening. The line was inserted and secured to the drape with an adhesive strip.   Active trocars/cannula were placed at the 10 and 2 o'clock positions approximately 3.5 mm from the surgical limbus. The cannula was visualized in the vitreous cavity.  The light pipe and vitreous cutter were inserted into the vitreous cavity and a core vitrectomy was performed.  Care taken to remove the vitreous up to the vitreous base for 360 degrees.   There was notable hemorrhage along the posterior pole and a posterior vitreous detachment was noted. The hemorrhage was overlying the macula.  Hemorrhage was also noted temporally.  With careful peripheral vitrectomy a shallow temporal retinal detachment was noted with a single horseshoe tear at 9 o'clock.  Additionally small hemorrhages were noted in the periphery.    3 rows of endolaser were applied 360 degrees to the periphery with the aid of scleral depression.  Care was taken to apply laser around the horseshoe tear and peripheral hemorrhages.    A complete air-fluid exchange was performed.  The superior cannulas were sequentially removed with concommitant tamponade using a cotton tipped applicator and noted to be air tight.  The infusion line and trocar were removed and the sclerotomy was noted to  be air tight with normal intraocular pressure by digital palpapation.  Subconjunctival injections of Ancef and Dexamethasone 4mg /52ml was placed in the infero-medial quadrant.   The speculum and drapes were removed and the eye was patched with Polymixin/Bacitracin ophthalmic ointment. An eye shield was placed and the patient was transferred alert and conversant with stable vital signs to the post operative recovery area.  The patient tolerated the procedure well and no complications were noted.  Royston Cowper MD

## 2018-12-04 NOTE — Progress Notes (Signed)
Dr. Conrad [place] made aware of BP on arrival.

## 2018-12-04 NOTE — Anesthesia Preprocedure Evaluation (Signed)
Anesthesia Evaluation  Patient identified by MRN, date of birth, ID band Patient awake    Reviewed: Allergy & Precautions, NPO status , Patient's Chart, lab work & pertinent test results  Airway Mallampati: I  TM Distance: >3 FB Neck ROM: Full    Dental   Pulmonary former smoker,    Pulmonary exam normal        Cardiovascular hypertension, Pt. on medications + CAD and + Past MI  Normal cardiovascular exam     Neuro/Psych    GI/Hepatic   Endo/Other  diabetes, Type 2, Insulin Dependent  Renal/GU      Musculoskeletal   Abdominal   Peds  Hematology   Anesthesia Other Findings   Reproductive/Obstetrics                             Anesthesia Physical Anesthesia Plan  ASA: III  Anesthesia Plan: MAC   Post-op Pain Management:    Induction: Intravenous  PONV Risk Score and Plan: 2  Airway Management Planned: Nasal Cannula  Additional Equipment:   Intra-op Plan:   Post-operative Plan:   Informed Consent: I have reviewed the patients History and Physical, chart, labs and discussed the procedure including the risks, benefits and alternatives for the proposed anesthesia with the patient or authorized representative who has indicated his/her understanding and acceptance.       Plan Discussed with: Surgeon and CRNA  Anesthesia Plan Comments:         Anesthesia Quick Evaluation

## 2018-12-04 NOTE — Brief Op Note (Signed)
12/04/2018  5:40 PM  PATIENT:  Lauren Walker  85 y.o. female  PRE-OPERATIVE DIAGNOSIS:  VITREOUS HEMORRHAGE, OD  POST-OPERATIVE DIAGNOSIS:  Retinal detachment right eye with VITREOUS HEMORRHAGE, right eye  PROCEDURE:  Procedure(s): RETINAL DETACHMENT REPAIR WITH VITRECTOMY 25 GAUGE WITH ENDOLASER (Right), AND GAS TAMPONADE (SF6)  SURGEON:  Surgeon(s) and Role:    * Jalene Mullet, MD - Primary  PHYSICIAN ASSISTANT:   ASSISTANTS: none   ANESTHESIA:   local and MAC  EBL:  Minimal   BLOOD ADMINISTERED:none  DRAINS: none   LOCAL MEDICATIONS USED:  MARCAINE    and LIDOCAINE   SPECIMEN:  No Specimen  DISPOSITION OF SPECIMEN:  PATHOLOGY  COUNTS:  YES  TOURNIQUET:  * No tourniquets in log *  DICTATION: .Note written in EPIC  PLAN OF CARE: Discharge to home after PACU  PATIENT DISPOSITION:  PACU - hemodynamically stable.   Delay start of Pharmacological VTE agent (>24hrs) due to surgical blood loss or risk of bleeding: not applicable

## 2018-12-04 NOTE — H&P (Signed)
Date of examination:  12/03/18  Indication for surgery: Vitreous hemorrhage right eye  Pertinent past medical history:  Past Medical History:  Diagnosis Date  . Arrhythmia   . Arthritis   . Chronic kidney disease   . Coronary artery disease   . Diabetes mellitus without complication ([place])   . Diverticulitis   . Hypertension   . Hypothyroidism   . Myocardial infarction ([place])   . Thyroid disease     Pertinent ocular history:  Hypertensive retinopathy OS  Pertinent family history:  Family History  Problem Relation Age of Onset  . Arthritis Mother   . Heart disease Mother   . Hypertension Mother   . Arthritis Father   . Heart attack Father   . Heart disease Father   . Arthritis Daughter   . Arthritis Son   . Depression Maternal Aunt   . Hyperlipidemia Maternal Aunt   . Hypertension Maternal Aunt     General:  Healthy appearing patient in no distress.    Eyes:    Acuity OD 20/100  OS 20/25  cc  External: Within normal limits   *  Anterior segment: Within normal limits     Motility:     Fundus: Vitreous hemorrhage OD, copper wiring OS with AV nicking    Impression:Vitreous Hemorrahge right eye  Plan: Vitrectomy with endolaser

## 2018-12-06 ENCOUNTER — Encounter (HOSPITAL_COMMUNITY): Payer: Self-pay | Admitting: Ophthalmology

## 2018-12-06 NOTE — Anesthesia Postprocedure Evaluation (Addendum)
Anesthesia Post Note  Patient: Lauren Walker  Procedure(s) Performed: RETINAL DETACHMENT REPAIR PPV 25 GAUGE WITH ENDO LASER AIR/FLUID EXCHANGE SF6 GAS INJECTION (Right Eye)     Patient location during evaluation: PACU Anesthesia Type: MAC Level of consciousness: awake and alert Pain management: pain level controlled Vital Signs Assessment: post-procedure vital signs reviewed and stable Respiratory status: spontaneous breathing, nonlabored ventilation, respiratory function stable and patient connected to nasal cannula oxygen Cardiovascular status: stable and blood pressure returned to baseline Postop Assessment: no apparent nausea or vomiting Anesthetic complications: no    Last Vitals:  Vitals:   12/04/18 1800 12/04/18 1815  BP: (!) 177/38 (!) 161/46  Pulse: (!) 57 62  Resp: 20 17  Temp:    SpO2: 94% 95%    Last Pain:  Vitals:   12/04/18 1815  TempSrc:   PainSc: 0-No pain                 Kilah Drahos DAVID

## 2018-12-06 NOTE — Addendum Note (Signed)
Addendum  created 12/06/18 0806 by Lillia Abed, MD   Clinical Note Signed

## 2018-12-08 NOTE — Discharge Summary (Signed)
Lauren Walker                                                                               12/08/2018                                              Ophthalmology Discharge Summary                                           HPI: Recent surgery with need for overnight stay due to respiratory decline  Pertinent Medical History:   Active Ambulatory Problems    Diagnosis Date Noted  . Hyperlipidemia associated with type 2 diabetes mellitus ([name]) 08/16/2018  . Type 2 diabetes mellitus with other circulatory complications ([name]) 80/04/4915  . Hypothyroid 08/16/2018  . HTN (hypertension) 08/16/2018  . CAD (coronary artery disease) 08/16/2018  . History of MI (myocardial infarction) 08/16/2018  . Paroxysmal atrial fibrillation ([name]) 10/11/2018  . Mitral regurgitation moderate to severe based on echocardiogram in summer 2020 11/04/2018   Resolved Ambulatory Problems    Diagnosis Date Noted  . No Resolved Ambulatory Problems   Past Medical History:  Diagnosis Date  . Arrhythmia   . Arthritis   . Chronic kidney disease   . Coronary artery disease   . Diabetes mellitus without complication ([name])   . Diverticulitis   . Hypertension   . Hypothyroidism   . Myocardial infarction ([name])   . Thyroid disease     The patient was kept overnight for observation and discharged to the nursing home after retinal detachment repair requiring removal of gas and placement of silicone oil due to the patient's inability to position.    The patient is to continue Pred Forte QID and Ofloxacin QID.  We will see her for post-operative exam in our office.  She is to resume all meds prior to surgery at the nursing home.  Jalene Mullet

## 2018-12-10 ENCOUNTER — Other Ambulatory Visit: Payer: Self-pay | Admitting: General Practice

## 2018-12-10 MED ORDER — FUROSEMIDE 20 MG PO TABS: 20.0000 mg | ORAL_TABLET | Freq: Every day | ORAL | 0 refills | Status: DC

## 2018-12-16 ENCOUNTER — Ambulatory Visit: Payer: Medicare Other | Admitting: Family Medicine

## 2018-12-21 ENCOUNTER — Other Ambulatory Visit: Payer: Self-pay | Admitting: General Practice

## 2018-12-21 MED ORDER — FENOFIBRATE 160 MG PO TABS: 160.0000 mg | ORAL_TABLET | Freq: Every day | ORAL | 1 refills | Status: DC

## 2018-12-22 LAB — HM DIABETES EYE EXAM

## 2018-12-29 ENCOUNTER — Ambulatory Visit (INDEPENDENT_AMBULATORY_CARE_PROVIDER_SITE_OTHER): Payer: Medicare Other | Admitting: Family Medicine

## 2018-12-29 ENCOUNTER — Encounter: Payer: Self-pay | Admitting: Family Medicine

## 2018-12-29 ENCOUNTER — Other Ambulatory Visit: Payer: Self-pay

## 2018-12-29 VITALS — BP 121/78 | HR 78 | Temp 97.1°F | Resp 16 | Ht 64.0 in | Wt 173.5 lb

## 2018-12-29 DIAGNOSIS — E039 Hypothyroidism, unspecified: Secondary | ICD-10-CM | POA: Diagnosis not present

## 2018-12-29 DIAGNOSIS — N289 Disorder of kidney and ureter, unspecified: Secondary | ICD-10-CM | POA: Insufficient documentation

## 2018-12-29 DIAGNOSIS — E1169 Type 2 diabetes mellitus with other specified complication: Secondary | ICD-10-CM

## 2018-12-29 DIAGNOSIS — I2510 Atherosclerotic heart disease of native coronary artery without angina pectoris: Secondary | ICD-10-CM | POA: Diagnosis not present

## 2018-12-29 DIAGNOSIS — E785 Hyperlipidemia, unspecified: Secondary | ICD-10-CM

## 2018-12-29 DIAGNOSIS — I10 Essential (primary) hypertension: Secondary | ICD-10-CM | POA: Diagnosis not present

## 2018-12-29 DIAGNOSIS — E1159 Type 2 diabetes mellitus with other circulatory complications: Secondary | ICD-10-CM

## 2018-12-29 DIAGNOSIS — Z1231 Encounter for screening mammogram for malignant neoplasm of breast: Secondary | ICD-10-CM | POA: Diagnosis not present

## 2018-12-29 DIAGNOSIS — I480 Paroxysmal atrial fibrillation: Secondary | ICD-10-CM

## 2018-12-29 LAB — LIPID PANEL
Cholesterol: 109 mg/dL (ref 0–200)
HDL: 48.4 mg/dL (ref >39.00)
LDL Cholesterol: 30 mg/dL (ref 0–99)
NonHDL: 60.58
Total CHOL/HDL Ratio: 2
Triglycerides: 154 mg/dL — ABNORMAL HIGH (ref 0.0–149.0)
VLDL: 30.8 mg/dL (ref 0.0–40.0)

## 2018-12-29 LAB — HEPATIC FUNCTION PANEL
ALT: 12 U/L (ref 0–35)
AST: 20 U/L (ref 0–37)
Albumin: 3.7 g/dL (ref 3.5–5.2)
Alkaline Phosphatase: 23 U/L — ABNORMAL LOW (ref 39–117)
Bilirubin, Direct: 0.2 mg/dL (ref 0.0–0.3)
Total Bilirubin: 0.5 mg/dL (ref 0.2–1.2)
Total Protein: 6 g/dL (ref 6.0–8.3)

## 2018-12-29 LAB — CBC WITH DIFFERENTIAL/PLATELET
Basophils Absolute: 0 10*3/uL (ref 0.0–0.1)
Basophils Relative: 0.7 % (ref 0.0–3.0)
Eosinophils Absolute: 0.3 10*3/uL (ref 0.0–0.7)
Eosinophils Relative: 3.8 % (ref 0.0–5.0)
HCT: 31.3 % — ABNORMAL LOW (ref 36.0–46.0)
Hemoglobin: 10.6 g/dL — ABNORMAL LOW (ref 12.0–15.0)
Lymphocytes Relative: 20.4 % (ref 12.0–46.0)
Lymphs Abs: 1.4 10*3/uL (ref 0.7–4.0)
MCHC: 33.7 g/dL (ref 30.0–36.0)
MCV: 91.3 fl (ref 78.0–100.0)
Monocytes Absolute: 0.6 10*3/uL (ref 0.1–1.0)
Monocytes Relative: 7.9 % (ref 3.0–12.0)
Neutro Abs: 4.7 10*3/uL (ref 1.4–7.7)
Neutrophils Relative %: 67.2 % (ref 43.0–77.0)
Platelets: 246 10*3/uL (ref 150.0–400.0)
RBC: 3.43 Mil/uL — ABNORMAL LOW (ref 3.87–5.11)
RDW: 13.8 % (ref 11.5–15.5)
WBC: 7 10*3/uL (ref 4.0–10.5)

## 2018-12-29 LAB — BASIC METABOLIC PANEL
BUN: 54 mg/dL — ABNORMAL HIGH (ref 6–23)
CO2: 30 mEq/L (ref 19–32)
Calcium: 9.2 mg/dL (ref 8.4–10.5)
Chloride: 99 mEq/L (ref 96–112)
Creatinine, Ser: 2.13 mg/dL — ABNORMAL HIGH (ref 0.40–1.20)
GFR: 21.88 mL/min — ABNORMAL LOW (ref >60.00)
Glucose, Bld: 194 mg/dL — ABNORMAL HIGH (ref 70–99)
Potassium: 4.1 mEq/L (ref 3.5–5.1)
Sodium: 137 mEq/L (ref 135–145)

## 2018-12-29 LAB — HEMOGLOBIN A1C: Hgb A1c MFr Bld: 6.2 % (ref 4.6–6.5)

## 2018-12-29 LAB — TSH: TSH: 1.35 u[IU]/mL (ref 0.35–4.50)

## 2018-12-29 NOTE — Assessment & Plan Note (Signed)
Chronic problem.  Tolerating statin w/o difficulty.  Check labs.  Adjust meds prn  

## 2018-12-29 NOTE — Assessment & Plan Note (Signed)
Chronic problem.  Currently diet controlled.  UTD on foot exam, eye exam, and on ACE for renal protection.  Currently asymptomatic.  Check labs and adjust tx plan prn.

## 2018-12-29 NOTE — Assessment & Plan Note (Signed)
Chronic problem.  Well controlled.  Currently asymptomatic.  Check labs.  No anticipated med changes. 

## 2018-12-29 NOTE — Assessment & Plan Note (Signed)
Anticoagulated on Eliquis and rate controlled on Coreg.  Currently asymptomatic.

## 2018-12-29 NOTE — Assessment & Plan Note (Signed)
Chronic problem.  Currently asymptomatic.  Check labs.  Adjust meds prn  

## 2018-12-29 NOTE — Patient Instructions (Addendum)
Follow up in 6 months to recheck diabetes, BP, cholesterol We'll notify you of your lab results and make any changes if needed Keep up the good work!  You look great! We'll call you with your mammogram appt Call with any questions or concerns Stay Safe!  Stay Healthy!   Preventive Care 83 Years and Older, Female Preventive care refers to lifestyle choices and visits with your health care provider that can promote health and wellness. This includes:  A yearly physical exam. This is also called an annual well check.  Regular dental and eye exams.  Immunizations.  Screening for certain conditions.  Healthy lifestyle choices, such as diet and exercise. What can I expect for my preventive care visit? Physical exam Your health care provider will check:  Height and weight. These may be used to calculate body mass index (BMI), which is a measurement that tells if you are at a healthy weight.  Heart rate and blood pressure.  Your skin for abnormal spots. Counseling Your health care provider may ask you questions about:  Alcohol, tobacco, and drug use.  Emotional well-being.  Home and relationship well-being.  Sexual activity.  Eating habits.  History of falls.  Memory and ability to understand (cognition).  Work and work Statistician.  Pregnancy and menstrual history. What immunizations do I need?  Influenza (flu) vaccine  This is recommended every year. Tetanus, diphtheria, and pertussis (Tdap) vaccine  You may need a Td booster every 10 years. Varicella (chickenpox) vaccine  You may need this vaccine if you have not already been vaccinated. Zoster (shingles) vaccine  You may need this after age 833. Pneumococcal conjugate (PCV13) vaccine  One dose is recommended after age 83. Pneumococcal polysaccharide (PPSV23) vaccine  One dose is recommended after age 833. Measles, mumps, and rubella (MMR) vaccine  You may need at least one dose of MMR if you were born  in 1957 or later. You may also need a second dose. Meningococcal conjugate (MenACWY) vaccine  You may need this if you have certain conditions. Hepatitis A vaccine  You may need this if you have certain conditions or if you travel or work in places where you may be exposed to hepatitis A. Hepatitis B vaccine  You may need this if you have certain conditions or if you travel or work in places where you may be exposed to hepatitis B. Haemophilus influenzae type b (Hib) vaccine  You may need this if you have certain conditions. You may receive vaccines as individual doses or as more than one vaccine together in one shot (combination vaccines). Talk with your health care provider about the risks and benefits of combination vaccines. What tests do I need? Blood tests  Lipid and cholesterol levels. These may be checked every 5 years, or more frequently depending on your overall health.  Hepatitis C test.  Hepatitis B test. Screening  Lung cancer screening. You may have this screening every year starting at age 83 if you have a 30-pack-year history of smoking and currently smoke or have quit within the past 15 years.  Colorectal cancer screening. All adults should have this screening starting at age 83 and continuing until age 83. Your health care provider may recommend screening at age 83 if you are at increased risk. You will have tests every 1-10 years, depending on your results and the type of screening test.  Diabetes screening. This is done by checking your blood sugar (glucose) after you have not eaten for a while (fasting).  You may have this done every 1-3 years.  Mammogram. This may be done every 1-2 years. Talk with your health care provider about how often you should have regular mammograms.  BRCA-related cancer screening. This may be done if you have a family history of breast, ovarian, tubal, or peritoneal cancers. Other tests  Sexually transmitted disease (STD)  testing.  Bone density scan. This is done to screen for osteoporosis. You may have this done starting at age 83. Follow these instructions at home: Eating and drinking  Eat a diet that includes fresh fruits and vegetables, whole grains, lean protein, and low-fat dairy products. Limit your intake of foods with high amounts of sugar, saturated fats, and salt.  Take vitamin and mineral supplements as recommended by your health care provider.  Do not drink alcohol if your health care provider tells you not to drink.  If you drink alcohol: ? Limit how much you have to 0-1 drink a day. ? Be aware of how much alcohol is in your drink. In the [place] one drink equals one 12 oz bottle of beer (355 mL), one 5 oz glass of wine (148 mL), or one 1 oz glass of hard liquor (44 mL). Lifestyle  Take daily care of your teeth and gums.  Stay active. Exercise for at least 30 minutes on 5 or more days each week.  Do not use any products that contain nicotine or tobacco, such as cigarettes, e-cigarettes, and chewing tobacco. If you need help quitting, ask your health care provider.  If you are sexually active, practice safe sex. Use a condom or other form of protection in order to prevent STIs (sexually transmitted infections).  Talk with your health care provider about taking a low-dose aspirin or statin. What's next?  Go to your health care provider once a year for a well check visit.  Ask your health care provider how often you should have your eyes and teeth checked.  Stay up to date on all vaccines. This information is not intended to replace advice given to you by your health care provider. Make sure you discuss any questions you have with your health care provider. Document Released: 03/02/2015 Document Revised: 01/28/2018 Document Reviewed: 01/28/2018 Elsevier Patient Education  2020 [employer].

## 2018-12-29 NOTE — Assessment & Plan Note (Signed)
Chronic problem.  Following w/ Cardiology.  On ASA, beta blocker, ACE, statin.  Currently asymptomatic.  Will continue to follow.

## 2018-12-29 NOTE — Assessment & Plan Note (Signed)
Chronic problem.  Following w/ Dr Hollie Salk.  Will follow along and assist as able.

## 2018-12-29 NOTE — Progress Notes (Signed)
   Subjective:    Patient ID: Lauren Walker, female    DOB: December 25, 1929, 85 y.o.   MRN: [id number]  HPI Here today for MWV.  Risk Factors: CAD- chronic problem, on ASA, statin, beta blocker, Imdur. HTN- chronic problem, on Coreg 12.5mg  BID, Lasix 20mg  daily, Hydralazine 50mg  TID, Quinapril 20mg .  No CP, SOB above baseline, HAs, edema above baseline.. Hyperlipidemia- chronic problem, on Lipitor 40mg  daily and Fenofibrate 160mg  daily.  Denies abd pain, N/V. Hypothyroid- chronic problem, on Levothyroxine 150mcg daily.  Denies excessive fatigue.  No changes to skin/hair/nails. Afib- chronic problem, on Eliquis and rate controlled on Coreg.  Denies palpitations w/ rare exception. DM- chronic problem, UTD on foot exam, eye exam, and on ACE.  Denies symptomatic lows, numbness/tingling of hands/feet Renal insufficiency- following w/ Renal (Dr Hollie Salk) Physical Activity: walking regularly Fall Risk: low but using a cane to steady herself Depression: denies Hearing: normal to conversational tones, decreased to whispered voice ADL's: independent Cognitive: normal linear thought process, memory and attention intact Home Safety: safe at home, lives w/ husband.  Daughter lives locally Height, Weight, BMI, Visual Acuity: see vitals, vision 20/60 after cataract surgery Counseling: no need for colonoscopy (last done 2019), UTD on flu shot, pneumonia vaccines.  Due for mammo- order placed Labs Ordered: See A&P Care Plan: See A&P   Patient Care Team    Relationship Specialty Notifications Start End  Midge Minium, MD PCP - General Family Medicine  08/16/18   Park Liter, MD Consulting Physician Cardiology  12/29/18   Jalene Mullet, MD Consulting Physician Ophthalmology  12/29/18   Madelon Lips, MD Consulting Physician Nephrology  12/29/18     Review of Systems For ROS see HPI     Objective:   Physical Exam Vitals signs reviewed.  Constitutional:      General: She is not in  acute distress.    Appearance: Normal appearance. She is well-developed.  HENT:     Head: Normocephalic and atraumatic.  Eyes:     Conjunctiva/sclera: Conjunctivae normal.     Pupils: Pupils are equal, round, and reactive to light.  Neck:     Musculoskeletal: Normal range of motion and neck supple.     Thyroid: No thyromegaly.  Cardiovascular:     Rate and Rhythm: Normal rate and regular rhythm.     Heart sounds: Murmur (II-III/VI SEM) present.  Pulmonary:     Effort: Pulmonary effort is normal. No respiratory distress.     Breath sounds: Normal breath sounds.  Abdominal:     General: There is no distension.     Palpations: Abdomen is soft.     Tenderness: There is no abdominal tenderness.  Musculoskeletal:     Right lower leg: Edema (1+ pitting edema) present.     Left lower leg: Edema (1+ pitting edema) present.  Lymphadenopathy:     Cervical: No cervical adenopathy.  Skin:    General: Skin is warm and dry.  Neurological:     Mental Status: She is alert and oriented to person, place, and time.  Psychiatric:        Behavior: Behavior normal.           Assessment & Plan:

## 2018-12-30 ENCOUNTER — Encounter: Payer: Self-pay | Admitting: General Practice

## 2019-01-05 ENCOUNTER — Ambulatory Visit (INDEPENDENT_AMBULATORY_CARE_PROVIDER_SITE_OTHER): Payer: Medicare Other | Admitting: Podiatry

## 2019-01-05 ENCOUNTER — Encounter: Payer: Self-pay | Admitting: Podiatry

## 2019-01-05 ENCOUNTER — Other Ambulatory Visit: Payer: Self-pay

## 2019-01-05 VITALS — BP 161/63 | HR 67

## 2019-01-05 DIAGNOSIS — B351 Tinea unguium: Secondary | ICD-10-CM

## 2019-01-05 DIAGNOSIS — E119 Type 2 diabetes mellitus without complications: Secondary | ICD-10-CM | POA: Diagnosis not present

## 2019-01-05 DIAGNOSIS — M79675 Pain in left toe(s): Secondary | ICD-10-CM

## 2019-01-05 DIAGNOSIS — M2041 Other hammer toe(s) (acquired), right foot: Secondary | ICD-10-CM

## 2019-01-05 DIAGNOSIS — M2042 Other hammer toe(s) (acquired), left foot: Secondary | ICD-10-CM

## 2019-01-05 DIAGNOSIS — L84 Corns and callosities: Secondary | ICD-10-CM | POA: Diagnosis not present

## 2019-01-05 DIAGNOSIS — M2011 Hallux valgus (acquired), right foot: Secondary | ICD-10-CM | POA: Diagnosis not present

## 2019-01-05 DIAGNOSIS — M79674 Pain in right toe(s): Secondary | ICD-10-CM | POA: Diagnosis not present

## 2019-01-05 DIAGNOSIS — M2012 Hallux valgus (acquired), left foot: Secondary | ICD-10-CM

## 2019-01-05 NOTE — Patient Instructions (Signed)
Diabetes Mellitus and Foot Care Foot care is an important part of your health, especially when you have diabetes. Diabetes may cause you to have problems because of poor blood flow (circulation) to your feet and legs, which can cause your skin to:  Become thinner and drier.  Break more easily.  Heal more slowly.  Peel and crack. You may also have nerve damage (neuropathy) in your legs and feet, causing decreased feeling in them. This means that you may not notice minor injuries to your feet that could lead to more serious problems. Noticing and addressing any potential problems early is the best way to prevent future foot problems. How to care for your feet Foot hygiene  Wash your feet daily with warm water and mild soap. Do not use hot water. Then, pat your feet and the areas between your toes until they are completely dry. Do not soak your feet as this can dry your skin.  Trim your toenails straight across. Do not dig under them or around the cuticle. File the edges of your nails with an emery board or nail file.  Apply a moisturizing lotion or petroleum jelly to the skin on your feet and to dry, brittle toenails. Use lotion that does not contain alcohol and is unscented. Do not apply lotion between your toes. Shoes and socks  Wear clean socks or stockings every day. Make sure they are not too tight. Do not wear knee-high stockings since they may decrease blood flow to your legs.  Wear shoes that fit properly and have enough cushioning. Always look in your shoes before you put them on to be sure there are no objects inside.  To break in new shoes, wear them for just a few hours a day. This prevents injuries on your feet. Wounds, scrapes, corns, and calluses  Check your feet daily for blisters, cuts, bruises, sores, and redness. If you cannot see the bottom of your feet, use a mirror or ask someone for help.  Do not cut corns or calluses or try to remove them with medicine.  If you  find a minor scrape, cut, or break in the skin on your feet, keep it and the skin around it clean and dry. You may clean these areas with mild soap and water. Do not clean the area with peroxide, alcohol, or iodine.  If you have a wound, scrape, corn, or callus on your foot, look at it several times a day to make sure it is healing and not infected. Check for: ? Redness, swelling, or pain. ? Fluid or blood. ? Warmth. ? Pus or a bad smell. General instructions  Do not cross your legs. This may decrease blood flow to your feet.  Do not use heating pads or hot water bottles on your feet. They may burn your skin. If you have lost feeling in your feet or legs, you may not know this is happening until it is too late.  Protect your feet from hot and cold by wearing shoes, such as at the beach or on hot pavement.  Schedule a complete foot exam at least once a year (annually) or more often if you have foot problems. If you have foot problems, report any cuts, sores, or bruises to your health care provider immediately. Contact a health care provider if:  You have a medical condition that increases your risk of infection and you have any cuts, sores, or bruises on your feet.  You have an injury that is not   healing.  You have redness on your legs or feet.  You feel burning or tingling in your legs or feet.  You have pain or cramps in your legs and feet.  Your legs or feet are numb.  Your feet always feel cold.  You have pain around a toenail. Get help right away if:  You have a wound, scrape, corn, or callus on your foot and: ? You have pain, swelling, or redness that gets worse. ? You have fluid or blood coming from the wound, scrape, corn, or callus. ? Your wound, scrape, corn, or callus feels warm to the touch. ? You have pus or a bad smell coming from the wound, scrape, corn, or callus. ? You have a fever. ? You have a red line going up your leg. Summary  Check your feet every day  for cuts, sores, red spots, swelling, and blisters.  Moisturize feet and legs daily.  Wear shoes that fit properly and have enough cushioning.  If you have foot problems, report any cuts, sores, or bruises to your health care provider immediately.  Schedule a complete foot exam at least once a year (annually) or more often if you have foot problems. This information is not intended to replace advice given to you by your health care provider. Make sure you discuss any questions you have with your health care provider. Document Released: 02/01/2000 Document Revised: 03/18/2017 Document Reviewed: 03/07/2016 Elsevier Patient Education  2020 Elsevier Inc[employer]  

## 2019-01-10 ENCOUNTER — Other Ambulatory Visit: Payer: Self-pay

## 2019-01-10 ENCOUNTER — Ambulatory Visit (HOSPITAL_BASED_OUTPATIENT_CLINIC_OR_DEPARTMENT_OTHER)
Admission: RE | Admit: 2019-01-10 | Discharge: 2019-01-10 | Disposition: A | Discharge status: Home / Self Care | Payer: Medicare Other | Source: Ambulatory Visit | Attending: Family Medicine | Admitting: Family Medicine

## 2019-01-10 DIAGNOSIS — Z1231 Encounter for screening mammogram for malignant neoplasm of breast: Secondary | ICD-10-CM

## 2019-01-12 NOTE — Progress Notes (Signed)
Subjective: Lauren Walker presents today referred by Midge Minium, MD for diabetic foot evaluation.  Patient relates 25 year history of diabetes.  Patient denies any history of foot wounds.  Patient denies any history of numbness, tingling, burning, pins/needles sensations.  Today, patient c/o of painful, discolored, thick toenails which interfere with daily activities.  Pain is aggravated when wearing enclosed shoe gear.   Past Medical History:  Diagnosis Date  . Arrhythmia   . Arthritis   . Chronic kidney disease   . Coronary artery disease   . Diabetes mellitus without complication ([place])   . Diverticulitis   . Hypertension   . Hypothyroidism   . Myocardial infarction ([place])   . Thyroid disease     Patient Active Problem List   Diagnosis Date Noted  . Renal insufficiency 12/29/2018  . Mitral regurgitation moderate to severe based on echocardiogram in summer 2020 11/04/2018  . Paroxysmal atrial fibrillation ([place]) 10/11/2018  . Hyperlipidemia associated with type 2 diabetes mellitus ([place]) 08/16/2018  . Type 2 diabetes mellitus with other circulatory complications ([place]) 26/33/3545  . Hypothyroid 08/16/2018  . HTN (hypertension) 08/16/2018  . CAD (coronary artery disease) 08/16/2018  . History of MI (myocardial infarction) 08/16/2018    Past Surgical History:  Procedure Laterality Date  . APPENDECTOMY  1936  . PARS PLANA VITRECTOMY Right 12/04/2018   Procedure: RETINAL DETACHMENT REPAIR PPV 25 GAUGE WITH ENDO LASER AIR/FLUID EXCHANGE SF6 GAS INJECTION;  Surgeon: Jalene Mullet, MD;  Location: [facility];  Service: Ophthalmology;  Laterality: Right;  . TONSILLECTOMY      Current Outpatient Medications on File Prior to Visit  Medication Sig Dispense Refill  . acetaminophen (TYLENOL) 650 MG CR tablet Take 650 mg by mouth every 8 (eight) hours as needed for pain.    Marland Kitchen apixaban (ELIQUIS) 2.5 MG TABS tablet Take 2.5 mg by mouth 2 (two) times daily.    Marland Kitchen aspirin EC 81 MG  tablet Take 81 mg by mouth daily.    Marland Kitchen atorvastatin (LIPITOR) 40 MG tablet Take 40 mg by mouth daily.    . Azelastine-Fluticasone 137-50 MCG/ACT SUSP Place 1 spray into the nose 2 (two) times daily as needed.    Marland Kitchen b complex vitamins capsule Take 1 capsule by mouth daily.    . Biotin 1 MG CAPS Take by mouth.    . carvedilol (COREG) 12.5 MG tablet Take 12.5 mg by mouth 2 (two) times daily with a meal.    . Cholecalciferol (VITAMIN D3) 50 MCG (2000 UT) TABS Take 1 capsule by mouth daily.    . fenofibrate 160 MG tablet Take 1 tablet (160 mg total) by mouth daily. 90 tablet 1  . Ferrous Sulfate (IRON) 325 (65 Fe) MG TABS Take 1 tablet by mouth daily.    . fluticasone (FLONASE) 50 MCG/ACT nasal spray Place 2 sprays into both nostrils daily. 16 g 6  . furosemide (LASIX) 20 MG tablet Take 1 tablet (20 mg total) by mouth daily. 90 tablet 0  . hydrALAZINE (APRESOLINE) 50 MG tablet Take 1 tablet (50 mg total) by mouth 3 (three) times daily. 90 tablet 2  . isosorbide mononitrate (IMDUR) 30 MG 24 hr tablet Take 30 mg by mouth daily.    Marland Kitchen levothyroxine (SYNTHROID) 150 MCG tablet Take 150 mcg by mouth daily before breakfast.    . magnesium oxide (MAG-OX) 400 MG tablet Take 400 mg by mouth daily.    . nitroGLYCERIN (NITROSTAT) 0.4 MG SL tablet Place 0.4 mg under  the tongue every 5 (five) minutes as needed for chest pain.    . prednisoLONE acetate (PRED FORTE) 1 % ophthalmic suspension     . quinapril (ACCUPRIL) 20 MG tablet     . vitamin C (ASCORBIC ACID) 500 MG tablet Take 500 mg by mouth daily.     No current facility-administered medications on file prior to visit.      Allergies  Allergen Reactions  . Tape Itching and Rash    Social History   Occupational History  . Not on file  Tobacco Use  . Smoking status: Former Research scientist (life sciences)  . Smokeless tobacco: Never Used  Substance and Sexual Activity  . Alcohol use: Yes    Comment: Very rare  . Drug use: Never  . Sexual activity: Not Currently     Family History  Problem Relation Age of Onset  . Arthritis Mother   . Heart disease Mother   . Hypertension Mother   . Arthritis Father   . Heart attack Father   . Heart disease Father   . Arthritis Daughter   . Arthritis Son   . Depression Maternal Aunt   . Hyperlipidemia Maternal Aunt   . Hypertension Maternal Aunt     Immunization History  Administered Date(s) Administered  . Fluad Quad(high Dose 65+) 11/08/2018  . Pneumococcal Conjugate-13 12/29/2014  . Pneumococcal Polysaccharide-23 12/29/2015    Review of systems: Positive Findings in bold print.  Constitutional:  chills, fatigue, fever, sweats, weight change Communication: Optometrist, sign Ecologist, hand writing, iPad/Android device Head: headaches, head injury Eyes: changes in vision, eye pain, glaucoma, cataracts, macular degeneration, diplopia, glare,  light sensitivity, eyeglasses or contacts, blindness Ears nose mouth throat: hearing impaired, hearing aids,  ringing in ears, deaf, sign language,  vertigo, nosebleeds,  rhinitis,  cold sores, snoring, swollen glands Cardiovascular: HTN, edema, arrhythmia, pacemaker in place, defibrillator in place, chest pain/tightness, chronic anticoagulation, blood clot, heart failure, MI Peripheral Vascular: leg cramps, varicose veins, blood clots, lymphedema, varicosities Respiratory:  difficulty breathing, denies congestion, SOB, wheezing, cough, emphysema Gastrointestinal: change in appetite or weight, abdominal pain, constipation, diarrhea, nausea, vomiting, vomiting blood, change in bowel habits, abdominal pain, jaundice, rectal bleeding, hemorrhoids, GERD Genitourinary:  nocturia,  pain on urination, polyuria,  blood in urine, Foley catheter, urinary urgency, ESRD on hemodialysis Musculoskeletal: amputation, cramping, stiff joints, painful joints, decreased joint motion, fractures, OA, gout, hemiplegia, paraplegia, uses cane, wheelchair bound, uses walker, uses  rollator Skin: +changes in toenails, color change, dryness, itching, mole changes,  rash, wound(s) Neurological: headaches, numbness in feet, paresthesias in feet, burning in feet, fainting,  seizures, change in speech,  headaches, memory problems/poor historian, cerebral palsy, weakness, paralysis, CVA, TIA Endocrine: diabetes, hypothyroidism, hyperthyroidism,  goiter, dry mouth, flushing, heat intolerance,  cold intolerance,  excessive thirst, denies polyuria,  nocturia Hematological:  easy bleeding, excessive bleeding, easy bruising, enlarged lymph nodes, on long term blood thinner, history of past transusions Allergy/immunological:  hives, eczema, frequent infections, multiple drug allergies, seasonal allergies, transplant recipient, multiple food allergies Psychiatric:  anxiety, depression, mood disorder, suicidal ideations, hallucinations, insomnia  Objective: Vitals:   01/05/19 1602  BP: (!) 161/63  Pulse: 67   Vascular Examination: Capillary refill time immediate x 10 digits.  Dorsalis pedis pulses pulses 2/4 b/l.  Posterior tibial pulses 1/4 b/l.   Digital hair sparse x 10 digits.  Skin temperature gradient WNL b/l.  Edema noted b/l ankles. No increased warmth. No open wounds.   Dermatological Examination: Skin with normal turgor, texture and  tone b/l.  Toenails 1-5 b/l discolored, thick, dystrophic with subungual debris and pain with palpation to nailbeds due to thickness of nails.  Hyperkeratotic lesion medial aspect right 2nd digit. No erythema, no edema, no drainage, no flocculence noted.  Musculoskeletal: Muscle strength 5/5 to all LE muscle groups b/l.  Hammertoes 2-5 b/l.  HAV with bunion b/l.   Neurological: Sensation intact 5/5 b/l with 10 gram monofilament.  Vibratory sensation intact b/l.   Assessment: 1. Painful onychomycosis toenails 1-5 b/l  2. Corn right 2nd digit 3. HAV with bunion b/l 4. Hammertoes 2-5 b/l 5. NIDDM  Plan: 1. Discussed  diabetic foot care principles. Literature dispensed on today. 2. Toenails 1-5 b/l were debrided in length and girth without iatrogenic bleeding. 3. Corn right 2nd digit pared utilizing sterile scalpel blade without incident. 4. Patient to continue soft, supportive shoe gear b/l. 5. Patient to report any pedal injuries to medical professional immediately. 6. Follow up 3 months.  7. Patient/POA to call should there be a concern in the interim.

## 2019-01-20 ENCOUNTER — Other Ambulatory Visit: Payer: Self-pay | Admitting: Family Medicine

## 2019-01-20 DIAGNOSIS — R928 Other abnormal and inconclusive findings on diagnostic imaging of breast: Secondary | ICD-10-CM

## 2019-01-26 ENCOUNTER — Other Ambulatory Visit: Payer: Self-pay

## 2019-01-26 ENCOUNTER — Ambulatory Visit
Admission: RE | Admit: 2019-01-26 | Discharge: 2019-01-26 | Disposition: A | Discharge status: Home / Self Care | Payer: Medicare Other | Source: Ambulatory Visit | Attending: Family Medicine | Admitting: Family Medicine

## 2019-01-26 ENCOUNTER — Other Ambulatory Visit: Payer: Self-pay | Admitting: Family Medicine

## 2019-01-26 DIAGNOSIS — R928 Other abnormal and inconclusive findings on diagnostic imaging of breast: Secondary | ICD-10-CM

## 2019-01-26 DIAGNOSIS — N6320 Unspecified lump in the left breast, unspecified quadrant: Secondary | ICD-10-CM

## 2019-02-14 ENCOUNTER — Encounter: Payer: Self-pay | Admitting: Cardiology

## 2019-02-14 ENCOUNTER — Ambulatory Visit (INDEPENDENT_AMBULATORY_CARE_PROVIDER_SITE_OTHER): Payer: Medicare Other | Admitting: Cardiology

## 2019-02-14 ENCOUNTER — Other Ambulatory Visit: Payer: Self-pay

## 2019-02-14 VITALS — BP 160/56 | HR 62 | Ht 64.0 in | Wt 173.8 lb

## 2019-02-14 DIAGNOSIS — I340 Nonrheumatic mitral (valve) insufficiency: Secondary | ICD-10-CM

## 2019-02-14 DIAGNOSIS — I2510 Atherosclerotic heart disease of native coronary artery without angina pectoris: Secondary | ICD-10-CM

## 2019-02-14 DIAGNOSIS — I480 Paroxysmal atrial fibrillation: Secondary | ICD-10-CM | POA: Diagnosis not present

## 2019-02-14 DIAGNOSIS — I10 Essential (primary) hypertension: Secondary | ICD-10-CM

## 2019-02-14 DIAGNOSIS — E1159 Type 2 diabetes mellitus with other circulatory complications: Secondary | ICD-10-CM

## 2019-02-14 DIAGNOSIS — I350 Nonrheumatic aortic (valve) stenosis: Secondary | ICD-10-CM

## 2019-02-14 DIAGNOSIS — I252 Old myocardial infarction: Secondary | ICD-10-CM

## 2019-02-14 HISTORY — DX: Nonrheumatic aortic (valve) stenosis: I35.0

## 2019-02-14 MED ORDER — HYDRALAZINE HCL 50 MG PO TABS: 50.0000 mg | ORAL_TABLET | Freq: Three times a day (TID) | ORAL | 3 refills | Status: DC

## 2019-02-14 NOTE — Patient Instructions (Addendum)
Medication Instructions:  Your physician recommends that you continue on your current medications as directed. Please refer to the Current Medication list given to you today.  *If you need a refill on your cardiac medications before your next appointment, please call your pharmacy*  Lab Work: None today If you have labs (blood work) drawn today and your tests are completely normal, you will receive your results only by: Marland Kitchen MyChart Message (if you have MyChart) OR . A paper copy in the mail If you have any lab test that is abnormal or we need to change your treatment, we will call you to review the results.  Testing/Procedures: None today  Follow-Up: At [facility], you and your health needs are our priority.  As part of our continuing mission to provide you with exceptional heart care, we have created designated Provider Care Teams.  These Care Teams include your primary Cardiologist (physician) and Advanced Practice Providers (APPs -  Physician Assistants and Nurse Practitioners) who all work together to provide you with the care you need, when you need it.  Your next appointment:   1 week(s)  The format for your next appointment:   In Person  Provider:   You may see Dr. Agustin Cree or the following Advanced Practice Provider on your designated Care Team:    Laurann Montana, FNP    Other Instructions This visit will be for a follow up EKG.

## 2019-02-14 NOTE — Progress Notes (Signed)
Cardiology Office Note:    Date:  02/14/2019   ID:  Lauren Walker, DOB 05-May-1929, MRN [id number]  PCP:  Midge Minium, MD  Cardiologist:  Jenne Campus, MD    Referring MD: Midge Minium, MD   No chief complaint on file. Doing well  History of Present Illness:    Lauren Walker is a 85 y.o. female with history of aortic stenosis which is mild, also moderate to severe mitral regurgitation, remote coronary artery disease with myocardial infarction, essential hypertension.  Comes today to my office for follow-up overall seems to be doing well she walks around with a cane because of some dizziness.  No passing out.  Denies have any chest pain tightness squeezing pressure burning chest no more shortness of breath than usual.  Still have some swelling of lower extremities like before.  Past Medical History:  Diagnosis Date  . Arrhythmia   . Arthritis   . Chronic kidney disease   . Coronary artery disease   . Diabetes mellitus without complication ([place])   . Diverticulitis   . Hypertension   . Hypothyroidism   . Myocardial infarction ([place])   . Thyroid disease     Past Surgical History:  Procedure Laterality Date  . APPENDECTOMY  1936  . PARS PLANA VITRECTOMY Right 12/04/2018   Procedure: RETINAL DETACHMENT REPAIR PPV 25 GAUGE WITH ENDO LASER AIR/FLUID EXCHANGE SF6 GAS INJECTION;  Surgeon: Jalene Mullet, MD;  Location: [facility];  Service: Ophthalmology;  Laterality: Right;  . TONSILLECTOMY      Current Medications: Current Meds  Medication Sig  . acetaminophen (TYLENOL) 650 MG CR tablet Take 650 mg by mouth every 8 (eight) hours as needed for pain.  Marland Kitchen apixaban (ELIQUIS) 2.5 MG TABS tablet Take 2.5 mg by mouth 2 (two) times daily.  Marland Kitchen aspirin EC 81 MG tablet Take 81 mg by mouth daily.  Marland Kitchen atorvastatin (LIPITOR) 40 MG tablet Take 40 mg by mouth daily.  . Azelastine-Fluticasone 137-50 MCG/ACT SUSP Place 1 spray into the nose 2 (two) times daily as needed.  Marland Kitchen b  complex vitamins capsule Take 1 capsule by mouth daily.  . Biotin 1 MG CAPS Take by mouth.  . carvedilol (COREG) 12.5 MG tablet Take 12.5 mg by mouth 2 (two) times daily with a meal.  . Cholecalciferol (VITAMIN D3) 50 MCG (2000 UT) TABS Take 1 capsule by mouth daily.  . fenofibrate 160 MG tablet Take 1 tablet (160 mg total) by mouth daily.  . Ferrous Sulfate (IRON) 325 (65 Fe) MG TABS Take 1 tablet by mouth daily.  . fluticasone (FLONASE) 50 MCG/ACT nasal spray Place 2 sprays into both nostrils daily.  . furosemide (LASIX) 20 MG tablet Take 1 tablet (20 mg total) by mouth daily.  . hydrALAZINE (APRESOLINE) 50 MG tablet Take 1 tablet (50 mg total) by mouth 3 (three) times daily.  . isosorbide mononitrate (IMDUR) 30 MG 24 hr tablet Take 30 mg by mouth daily.  Marland Kitchen levothyroxine (SYNTHROID) 150 MCG tablet Take 150 mcg by mouth daily before breakfast.  . magnesium oxide (MAG-OX) 400 MG tablet Take 400 mg by mouth daily.  . nitroGLYCERIN (NITROSTAT) 0.4 MG SL tablet Place 0.4 mg under the tongue every 5 (five) minutes as needed for chest pain.  . prednisoLONE acetate (PRED FORTE) 1 % ophthalmic suspension   . quinapril (ACCUPRIL) 20 MG tablet   . vitamin C (ASCORBIC ACID) 500 MG tablet Take 500 mg by mouth daily.     Allergies:  Tape   Social History   Socioeconomic History  . Marital status: Married    Spouse name: Not on file  . Number of children: Not on file  . Years of education: Not on file  . Highest education level: Not on file  Occupational History  . Not on file  Tobacco Use  . Smoking status: Former Research scientist (life sciences)  . Smokeless tobacco: Never Used  Substance and Sexual Activity  . Alcohol use: Yes    Comment: Very rare  . Drug use: Never  . Sexual activity: Not Currently  Other Topics Concern  . Not on file  Social History Narrative  . Not on file   Social Determinants of Health   Financial Resource Strain:   . Difficulty of Paying Living Expenses: Not on file  Food  Insecurity:   . Worried About Charity fundraiser in the Last Year: Not on file  . Ran Out of Food in the Last Year: Not on file  Transportation Needs:   . Lack of Transportation (Medical): Not on file  . Lack of Transportation (Non-Medical): Not on file  Physical Activity:   . Days of Exercise per Week: Not on file  . Minutes of Exercise per Session: Not on file  Stress:   . Feeling of Stress : Not on file  Social Connections:   . Frequency of Communication with Friends and Family: Not on file  . Frequency of Social Gatherings with Friends and Family: Not on file  . Attends Religious Services: Not on file  . Active Member of Clubs or Organizations: Not on file  . Attends Archivist Meetings: Not on file  . Marital Status: Not on file     Family History: The patient's family history includes Arthritis in her daughter, father, mother, and son; Depression in her maternal aunt; Heart attack in her father; Heart disease in her father and mother; Hyperlipidemia in her maternal aunt; Hypertension in her maternal aunt and mother. ROS:   Please see the history of present illness.    All 14 point review of systems negative except as described per history of present illness  EKGs/Labs/Other Studies Reviewed:     EKG done today showed atrial fibrillation with ventricular rate of 116.  Some nonspecific ST segment changes. Recent Labs: 12/29/2018: ALT 12; BUN 54; Creatinine, Ser 2.13; Hemoglobin 10.6; Platelets 246.0; Potassium 4.1; Sodium 137; TSH 1.35  Recent Lipid Panel    Component Value Date/Time   CHOL 109 12/29/2018 1415   TRIG 154.0 (H) 12/29/2018 1415   HDL 48.40 12/29/2018 1415   CHOLHDL 2 12/29/2018 1415   VLDL 30.8 12/29/2018 1415   LDLCALC 30 12/29/2018 1415    Physical Exam:    VS:  BP (!) 160/56   Pulse 62   Ht 5\' 4"  (1.626 m)   Wt 173 lb 12.8 oz (78.8 kg)   SpO2 98%   BMI 29.83 kg/m     Wt Readings from Last 3 Encounters:  02/14/19 173 lb 12.8 oz  (78.8 kg)  12/29/18 173 lb 8 oz (78.7 kg)  11/04/18 178 lb (80.7 kg)     GEN:  Well nourished, well developed in no acute distress HEENT: Normal NECK: No JVD; No carotid bruits LYMPHATICS: No lymphadenopathy CARDIAC: Tachycardic, RRR, holosystolic murmur of mitral regurgitation which is 2/6 best heard at the apex, also systolic ejection murmur grade 1/6 best heard at the right upper portion of the sternum, no rubs, no gallops RESPIRATORY:  Clear to  auscultation without rales, wheezing or rhonchi  ABDOMEN: Soft, non-tender, non-distended MUSCULOSKELETAL:  No edema; No deformity  SKIN: Warm and dry LOWER EXTREMITIES: no swelling NEUROLOGIC:  Alert and oriented x 3 PSYCHIATRIC:  Normal affect   ASSESSMENT:    1. Nonrheumatic mitral valve regurgitation   2. Paroxysmal atrial fibrillation (HCC)   3. Type 2 diabetes mellitus with other circulatory complications ([place])   4. Essential hypertension   5. Coronary artery disease involving native coronary artery of native heart without angina pectoris   6. History of MI (myocardial infarction)   7. Nonrheumatic aortic valve stenosis    PLAN:    In order of problems listed above:  1. No rheumatic mitral valve regurgitation.  Last time and explained to her what the problem is she was surprised by this finding.  Will schedule her to have another echocardiogram so can look at the degree of mitral regurgitation and also try to determine the etiology of this phenomenon.  That will help [place] to judge what would be the best approach to it.  Today her blood pressure is elevated but she said at home many times after she takes medication she will feel very sleepy and drowsy.  Sometimes she tells me when she take blood pressure measurements during this time her blood pressure is 90/50.  Therefore, I will not increase any of her medications right now.  Again will await results of her echocardiogram.  I am concerned about swelling of lower extremities which is  about 1+.  But she said it is always that when she is not worried about it. 2. Paroxysmal atrial fibrillation: Today she is in tachycardia.  Will do EKG to confirm the rhythm.  We will continue Eliquis. 3. Essential hypertension elevation today.  But please look at discussion above.  I suspect she may have whitecoat hypertension and at home usually blood pressures on the lower side. 4. Coronary artery disease with remote history of MI.  No new findings.  No new symptoms. 5. Nonrheumatic aortic stenosis only mild.  6.  EKG done today show atrial fibrillation with ventricular rate of 116.  I will bring her back to the office next week to have another EKG done if EKG will still show atrial fibrillation with talk about potentially cardioverting her to sinus rhythm. Medication Adjustments/Labs and Tests Ordered: Current medicines are reviewed at length with the patient today.  Concerns regarding medicines are outlined above.  No orders of the defined types were placed in this encounter.  Medication changes: No orders of the defined types were placed in this encounter.   Signed, Park Liter, MD, [facility] 02/14/2019 2:11 PM    [place]

## 2019-02-16 ENCOUNTER — Other Ambulatory Visit: Payer: Self-pay

## 2019-02-16 ENCOUNTER — Ambulatory Visit (HOSPITAL_BASED_OUTPATIENT_CLINIC_OR_DEPARTMENT_OTHER)
Admission: RE | Admit: 2019-02-16 | Discharge: 2019-02-16 | Disposition: A | Discharge status: Home / Self Care | Payer: Medicare Other | Source: Ambulatory Visit | Attending: Cardiology | Admitting: Cardiology

## 2019-02-16 DIAGNOSIS — I480 Paroxysmal atrial fibrillation: Secondary | ICD-10-CM | POA: Diagnosis present

## 2019-02-16 NOTE — Progress Notes (Signed)
  Echocardiogram 2D Echocardiogram has been performed.  Lauren Walker 02/16/2019, 3:25 PM

## 2019-02-23 ENCOUNTER — Ambulatory Visit
Admission: RE | Admit: 2019-02-23 | Discharge: 2019-02-23 | Disposition: A | Discharge status: Home / Self Care | Payer: Medicare Other | Source: Ambulatory Visit | Attending: Family Medicine | Admitting: Family Medicine

## 2019-02-23 ENCOUNTER — Other Ambulatory Visit: Payer: Self-pay

## 2019-02-23 DIAGNOSIS — N6320 Unspecified lump in the left breast, unspecified quadrant: Secondary | ICD-10-CM

## 2019-02-25 ENCOUNTER — Ambulatory Visit: Payer: Medicare Other | Admitting: Cardiology

## 2019-02-25 ENCOUNTER — Ambulatory Visit: Payer: Medicare Other | Admitting: Family

## 2019-03-01 ENCOUNTER — Other Ambulatory Visit: Payer: Self-pay | Admitting: Family Medicine

## 2019-03-02 ENCOUNTER — Other Ambulatory Visit: Payer: Self-pay | Admitting: General Practice

## 2019-03-02 ENCOUNTER — Encounter: Payer: Self-pay | Admitting: Adult Health

## 2019-03-02 DIAGNOSIS — Z170 Estrogen receptor positive status [ER+]: Secondary | ICD-10-CM | POA: Insufficient documentation

## 2019-03-02 DIAGNOSIS — C50412 Malignant neoplasm of upper-outer quadrant of left female breast: Secondary | ICD-10-CM

## 2019-03-02 HISTORY — DX: Malignant neoplasm of upper-outer quadrant of left female breast: C50.412

## 2019-03-02 HISTORY — DX: Estrogen receptor positive status (ER+): Z17.0

## 2019-03-02 MED ORDER — CARVEDILOL 12.5 MG PO TABS: 12.5000 mg | ORAL_TABLET | Freq: Two times a day (BID) | ORAL | 1 refills | Status: DC

## 2019-03-02 MED ORDER — LEVOTHYROXINE SODIUM 150 MCG PO TABS: 150.0000 ug | ORAL_TABLET | Freq: Every day | ORAL | 1 refills | Status: DC

## 2019-03-02 MED ORDER — ISOSORBIDE MONONITRATE ER 30 MG PO TB24: 30.0000 mg | ORAL_TABLET | Freq: Every day | ORAL | 1 refills | Status: DC

## 2019-03-02 MED ORDER — ATORVASTATIN CALCIUM 40 MG PO TABS: 40.0000 mg | ORAL_TABLET | Freq: Every day | ORAL | 1 refills | Status: DC

## 2019-03-04 ENCOUNTER — Encounter: Payer: Self-pay | Admitting: Oncology

## 2019-03-04 ENCOUNTER — Other Ambulatory Visit: Payer: Self-pay | Admitting: Family Medicine

## 2019-03-04 ENCOUNTER — Telehealth: Payer: Self-pay | Admitting: Radiation Oncology

## 2019-03-04 ENCOUNTER — Telehealth: Payer: Self-pay | Admitting: Oncology

## 2019-03-04 NOTE — Telephone Encounter (Signed)
New Message:   LVM for patient to call back to schedule appt from referral received.

## 2019-03-04 NOTE — Telephone Encounter (Signed)
Received a new patient referral from Dr. Marlou Starks for a new dx of breast cancer. Lauren Walker has been cld and scheduled to see Dr. Jana Hakim on 1/25 at 4pm w/labs at 330pm. Pt has been made aware to arrive 15 minutes early. Letter mailed.

## 2019-03-07 ENCOUNTER — Telehealth: Payer: Self-pay | Admitting: Radiation Oncology

## 2019-03-07 NOTE — Telephone Encounter (Signed)
New message: ° ° °LVM for patient to return call to schedule appt from referral received. °

## 2019-03-10 ENCOUNTER — Encounter: Payer: Self-pay | Admitting: Cardiology

## 2019-03-10 ENCOUNTER — Ambulatory Visit (INDEPENDENT_AMBULATORY_CARE_PROVIDER_SITE_OTHER): Payer: Medicare Other | Admitting: Cardiology

## 2019-03-10 ENCOUNTER — Telehealth: Payer: Self-pay | Admitting: Radiation Oncology

## 2019-03-10 ENCOUNTER — Other Ambulatory Visit: Payer: Self-pay

## 2019-03-10 VITALS — BP 160/54 | HR 63 | Ht 64.0 in | Wt 180.0 lb

## 2019-03-10 DIAGNOSIS — I350 Nonrheumatic aortic (valve) stenosis: Secondary | ICD-10-CM

## 2019-03-10 DIAGNOSIS — I2510 Atherosclerotic heart disease of native coronary artery without angina pectoris: Secondary | ICD-10-CM

## 2019-03-10 DIAGNOSIS — E785 Hyperlipidemia, unspecified: Secondary | ICD-10-CM

## 2019-03-10 DIAGNOSIS — E1169 Type 2 diabetes mellitus with other specified complication: Secondary | ICD-10-CM | POA: Diagnosis not present

## 2019-03-10 DIAGNOSIS — I480 Paroxysmal atrial fibrillation: Secondary | ICD-10-CM

## 2019-03-10 DIAGNOSIS — I340 Nonrheumatic mitral (valve) insufficiency: Secondary | ICD-10-CM | POA: Diagnosis not present

## 2019-03-10 DIAGNOSIS — Z170 Estrogen receptor positive status [ER+]: Secondary | ICD-10-CM

## 2019-03-10 DIAGNOSIS — C50412 Malignant neoplasm of upper-outer quadrant of left female breast: Secondary | ICD-10-CM

## 2019-03-10 MED ORDER — APIXABAN 2.5 MG PO TABS: 2.5000 mg | ORAL_TABLET | Freq: Two times a day (BID) | ORAL | 3 refills | Status: DC

## 2019-03-10 NOTE — Telephone Encounter (Signed)
New message:   LVM with patient's spouse to return call to schedule appt from referral received.

## 2019-03-10 NOTE — Patient Instructions (Signed)
Medication Instructions:  Continue Eliquis *If you need a refill on your cardiac medications before your next appointment, please call your pharmacy*  Lab Work: none If you have labs (blood work) drawn today and your tests are completely normal, you will receive your results only by: Marland Kitchen MyChart Message (if you have MyChart) OR . A paper copy in the mail If you have any lab test that is abnormal or we need to change your treatment, we will call you to review the results.  Testing/Procedures: none  Follow-Up: At [facility], you and your health needs are our priority.  As part of our continuing mission to provide you with exceptional heart care, we have created designated Provider Care Teams.  These Care Teams include your primary Cardiologist (physician) and Advanced Practice Providers (APPs -  Physician Assistants and Nurse Practitioners) who all work together to provide you with the care you need, when you need it.  Your next appointment:   3 week(s)  The format for your next appointment:   Either In Person or Virtual  Provider:   Jenne Campus, MD  Other Instructions  Stay Well

## 2019-03-10 NOTE — Progress Notes (Signed)
Cardiology Office Note:    Date:  03/10/2019   ID:  Lauren Walker, DOB 11-14-1929, MRN [id number]  PCP:  Midge Minium, MD  Cardiologist:  Jenne Campus, MD    Referring MD: Midge Minium, MD   Chief Complaint  Patient presents with  . Follow-up    History of Present Illness:    Lauren Walker is a 86 y.o. female with past medical history significant for diabetes, remote coronary artery disease, mitral regurgitation which appears to be severe, mild aortic insufficiency, she comes today to my office to talk about her valve problem.  Overall she complained of having shortness of breath while walking there is no proximal nocturnal dyspnea.  She is rather living sedentary lifestyle.  She walks with a cane.  She did not notice any decrease recently in tolerance for exercise.  What complicate the situation a lot is the fact that she was discovered to have estrogen sensitive breast cancer and surgery is contemplated.  She comes today to my office to talk about it.  Past Medical History:  Diagnosis Date  . Arrhythmia   . Arthritis   . Chronic kidney disease   . Coronary artery disease   . Diabetes mellitus without complication ([place])   . Diverticulitis   . Hypertension   . Hypothyroidism   . Myocardial infarction ([place])   . Thyroid disease     Past Surgical History:  Procedure Laterality Date  . APPENDECTOMY  1936  . PARS PLANA VITRECTOMY Right 12/04/2018   Procedure: RETINAL DETACHMENT REPAIR PPV 25 GAUGE WITH ENDO LASER AIR/FLUID EXCHANGE SF6 GAS INJECTION;  Surgeon: Jalene Mullet, MD;  Location: [facility];  Service: Ophthalmology;  Laterality: Right;  . TONSILLECTOMY      Current Medications: Current Meds  Medication Sig  . acetaminophen (TYLENOL) 650 MG CR tablet Take 650 mg by mouth every 8 (eight) hours as needed for pain.  Marland Kitchen aspirin EC 81 MG tablet Take 81 mg by mouth daily.  Marland Kitchen atorvastatin (LIPITOR) 40 MG tablet Take 1 tablet (40 mg total) by mouth daily.  .  Azelastine-Fluticasone 137-50 MCG/ACT SUSP Place 1 spray into the nose 2 (two) times daily as needed.  Marland Kitchen b complex vitamins capsule Take 1 capsule by mouth daily.  . Biotin 1 MG CAPS Take by mouth.  . carvedilol (COREG) 12.5 MG tablet Take 1 tablet (12.5 mg total) by mouth 2 (two) times daily with a meal.  . Cholecalciferol (VITAMIN D3) 50 MCG (2000 UT) TABS Take 1 capsule by mouth daily.  . fenofibrate 160 MG tablet Take 1 tablet (160 mg total) by mouth daily.  . Ferrous Sulfate (IRON) 325 (65 Fe) MG TABS Take 1 tablet by mouth daily.  . fluticasone (FLONASE) 50 MCG/ACT nasal spray SPRAY 2 SPRAYS INTO EACH NOSTRIL EVERY DAY  . furosemide (LASIX) 20 MG tablet TAKE 1 TABLET BY MOUTH EVERY DAY  . hydrALAZINE (APRESOLINE) 50 MG tablet Take 1 tablet (50 mg total) by mouth 3 (three) times daily.  . isosorbide mononitrate (IMDUR) 30 MG 24 hr tablet Take 1 tablet (30 mg total) by mouth daily.  Marland Kitchen levothyroxine (SYNTHROID) 150 MCG tablet Take 1 tablet (150 mcg total) by mouth daily before breakfast.  . magnesium oxide (MAG-OX) 400 MG tablet Take 400 mg by mouth daily.  . nitroGLYCERIN (NITROSTAT) 0.4 MG SL tablet Place 0.4 mg under the tongue every 5 (five) minutes as needed for chest pain.  . prednisoLONE acetate (PRED FORTE) 1 % ophthalmic suspension   .  quinapril (ACCUPRIL) 20 MG tablet   . vitamin C (ASCORBIC ACID) 500 MG tablet Take 500 mg by mouth daily.  . [DISCONTINUED] apixaban (ELIQUIS) 2.5 MG TABS tablet Take 2.5 mg by mouth 2 (two) times daily.     Allergies:   Tape   Social History   Socioeconomic History  . Marital status: Married    Spouse name: Not on file  . Number of children: Not on file  . Years of education: Not on file  . Highest education level: Not on file  Occupational History  . Not on file  Tobacco Use  . Smoking status: Former Research scientist (life sciences)  . Smokeless tobacco: Never Used  Substance and Sexual Activity  . Alcohol use: Yes    Comment: Very rare  . Drug use: Never  .  Sexual activity: Not Currently  Other Topics Concern  . Not on file  Social History Narrative  . Not on file   Social Determinants of Health   Financial Resource Strain:   . Difficulty of Paying Living Expenses: Not on file  Food Insecurity:   . Worried About Charity fundraiser in the Last Year: Not on file  . Ran Out of Food in the Last Year: Not on file  Transportation Needs:   . Lack of Transportation (Medical): Not on file  . Lack of Transportation (Non-Medical): Not on file  Physical Activity:   . Days of Exercise per Week: Not on file  . Minutes of Exercise per Session: Not on file  Stress:   . Feeling of Stress : Not on file  Social Connections:   . Frequency of Communication with Friends and Family: Not on file  . Frequency of Social Gatherings with Friends and Family: Not on file  . Attends Religious Services: Not on file  . Active Member of Clubs or Organizations: Not on file  . Attends Archivist Meetings: Not on file  . Marital Status: Not on file     Family History: The patient's family history includes Arthritis in her daughter, father, mother, and son; Depression in her maternal aunt; Heart attack in her father; Heart disease in her father and mother; Hyperlipidemia in her maternal aunt; Hypertension in her maternal aunt and mother. ROS:   Please see the history of present illness.    All 14 point review of systems negative except as described per history of present illness  EKGs/Labs/Other Studies Reviewed:      Recent Labs: 12/29/2018: ALT 12; BUN 54; Creatinine, Ser 2.13; Hemoglobin 10.6; Platelets 246.0; Potassium 4.1; Sodium 137; TSH 1.35  Recent Lipid Panel    Component Value Date/Time   CHOL 109 12/29/2018 1415   TRIG 154.0 (H) 12/29/2018 1415   HDL 48.40 12/29/2018 1415   CHOLHDL 2 12/29/2018 1415   VLDL 30.8 12/29/2018 1415   LDLCALC 30 12/29/2018 1415    Physical Exam:    VS:  BP (!) 160/54 (BP Location: Left Arm, Patient  Position: Sitting, Cuff Size: Normal)   Pulse 63   Ht 5\' 4"  (1.626 m)   Wt 180 lb (81.6 kg)   SpO2 96%   BMI 30.90 kg/m     Wt Readings from Last 3 Encounters:  03/10/19 180 lb (81.6 kg)  02/14/19 173 lb 12.8 oz (78.8 kg)  12/29/18 173 lb 8 oz (78.7 kg)     GEN:  Well nourished, well developed in no acute distress HEENT: Normal NECK: No JVD; No carotid bruits LYMPHATICS: No lymphadenopathy CARDIAC: RRR,  systolic ejection murmur grade 2/6 best heard right upper portion of the sternum, there is also holosystolic murmur grade 2/6 best heard at left border of the sternum, no rubs, no gallops RESPIRATORY:  Clear to auscultation without rales, wheezing or rhonchi  ABDOMEN: Soft, non-tender, non-distended MUSCULOSKELETAL:  No edema; No deformity  SKIN: Warm and dry LOWER EXTREMITIES: no swelling NEUROLOGIC:  Alert and oriented x 3 PSYCHIATRIC:  Normal affect   ASSESSMENT:    1. Nonrheumatic mitral valve regurgitation   2. Hyperlipidemia associated with type 2 diabetes mellitus (HCC)   3. Paroxysmal atrial fibrillation ([place])   4. Coronary artery disease involving native coronary artery of native heart without angina pectoris   5. Nonrheumatic aortic valve stenosis   6. Malignant neoplasm of upper-outer quadrant of left breast in female, estrogen receptor positive ([place])    PLAN:    In order of problems listed above:  1. Nonrheumatic mitral valve regurgitation which appears to be severe.,  Left ventricle end-systolic diameter is only 3.2, left ventricle ejection fraction is normal, we were not able to assess pulmonary artery pressure secondary to lack of TR.  Her [place] Heart Association is difficult to judge because of her relatively sedentary lifestyle I guess she is probably 3.  We had a long discussion about what to do with the situation.  I talked to her about potentially surgery, I also talked to her about potentially mitral valve clip.  She said she would like to think it  over. 2. Breast cancer with some consideration for surgery.  Overall breast surgery is considered low risk surgery, second issue is type of anesthesia will get a use for the surgery.  If surgery will be done under local anesthesia with some sedation I think we can proceed with surgery with relatively acceptable risk.  However, if we talked about doing general spinal anesthesia she will be probably moderate risk from cardiac standpoint of view.  My understanding is this is estrogen sensitive breast cancer with her being 86 years old question I have is hormonal therapy option for her and what will be prognosis in this situation.  I will wait for response from oncology team. 3. History of coronary artery disease stable from that point review no new problems. 4. Type 2 diabetes followed by internal medicine team. 5. Dyslipidemia.  Stable   Medication Adjustments/Labs and Tests Ordered: Current medicines are reviewed at length with the patient today.  Concerns regarding medicines are outlined above.  No orders of the defined types were placed in this encounter.  Medication changes:  Meds ordered this encounter  Medications  . apixaban (ELIQUIS) 2.5 MG TABS tablet    Sig: Take 1 tablet (2.5 mg total) by mouth 2 (two) times daily.    Dispense:  180 tablet    Refill:  3    Signed, Park Liter, MD, [facility] 03/10/2019 1:51 PM    [place]

## 2019-03-11 ENCOUNTER — Telehealth: Payer: Self-pay | Admitting: Oncology

## 2019-03-11 ENCOUNTER — Encounter: Payer: Self-pay | Admitting: *Deleted

## 2019-03-11 NOTE — Progress Notes (Signed)
From: Lauren Walker  Sent: 03/11/2019  9:22 AM EST  To: Mauro Kaufmann, RN  Subject: AI vs surgery                   Pts cardiologist said she is high risk for cardiac event but cleared her to stop Eliquis. He asked about just putting her on AI due to heart and her age. Pts appt with Dr Jana Hakim is Monday. I'm holding her surgery orders until I hear back from you about which way she decides to go.   Thanks

## 2019-03-11 NOTE — Telephone Encounter (Signed)
Mrs. Kopecky cld wanting to r/s her new pt w/Dr. Jana Hakim from 1/25. Pt has been cld and rescheduled to see Dr. Jana Hakim on 1/27 at 4pm w/labs at 3:30pm. She's been made aware to arrive 15 minutes early.

## 2019-03-14 ENCOUNTER — Other Ambulatory Visit: Payer: Medicare Other

## 2019-03-14 ENCOUNTER — Ambulatory Visit: Payer: Medicare Other | Admitting: Oncology

## 2019-03-15 ENCOUNTER — Other Ambulatory Visit: Payer: Self-pay | Admitting: *Deleted

## 2019-03-15 DIAGNOSIS — Z170 Estrogen receptor positive status [ER+]: Secondary | ICD-10-CM

## 2019-03-15 DIAGNOSIS — C50412 Malignant neoplasm of upper-outer quadrant of left female breast: Secondary | ICD-10-CM

## 2019-03-15 NOTE — Progress Notes (Signed)
Lauren Walker  Telephone:([phone] Fax:([phone]     ID: MOKSHA DORGAN DOB: 84-Aug-1931  MR#: [id number]  HWT#:[id number]  Patient Care Team: Midge Minium, MD as PCP - General (Family Medicine) Park Liter, MD as Consulting Physician (Cardiology) Jalene Mullet, MD as Consulting Physician (Ophthalmology) Madelon Lips, MD as Consulting Physician (Nephrology) Mauro Kaufmann, RN as Oncology Nurse Navigator Rockwell Germany, RN as Oncology Nurse Navigator Phi Avans, Virgie Dad, MD as Consulting Physician (Oncology) Jovita Kussmaul, MD as Consulting Physician (General Surgery) Chauncey Cruel, MD OTHER MD:  CHIEF COMPLAINT: estrogen receptor positive breast cancer  CURRENT TREATMENT: Definitive surgery pending   HISTORY OF CURRENT ILLNESS: [name] had routine screening mammography on 01/10/2019 showing a possible abnormality in the left breast. She underwent left diagnostic mammography with tomography and left breast ultrasonography at [facility] on 01/26/2019 showing: breast density category B; there was a 1.1 cm left breast mass at 2 o'clock 8 cm from the nipple with irregular mixed echogenicity and internal blood flow.  There were two adjacent, oval masses measuring 1 cm and 1.2 cm; taking all 3 masses together totaled approximately 2 cm.  Left axilla is negative for adenopathy.  Accordingly on 02/23/2019 she proceeded to biopsy of the 2:00 o'clock left breast area in question. The pathology from this procedure ([id number]) showed: invasive ductal carcinoma with extracellular mucin, grade 2, with focal ductal carcinoma in situ. Prognostic indicators significant for: estrogen receptor, 100% positive and progesterone receptor, 100% positive, both with strong staining intensity. Proliferation marker Ki67 at 5%. HER2 negative by immunohistochemistry (1+).  The patient's subsequent history is as detailed below.   INTERVAL HISTORY: Lauren Walker was  evaluated in the breast cancer clinic on 03/16/2019 accompanied by her daughter Nunzio Cory. Her case was also presented at the multidisciplinary breast cancer conference 03/02/2019. At that time a preliminary plan was proposed: Breast conserving surgery with no sentinel lymph node sampling, no chemotherapy, consideration of adjuvant radiation, antiestrogens  The patient met with Dr. Marlou Starks on 03/09/2019.  He is waiting on cardiac clearance to proceed with surgery.  He suggested if there would be any delay we could start antiestrogens preop.   REVIEW OF SYSTEMS: There were no specific symptoms leading to the original mammogram, which was routinely scheduled. The patient denies unusual headaches, visual changes, nausea, vomiting, stiff neck, dizziness, or gait imbalance.  There have been no recent falls.  There has been no cough, phlegm production, or pleurisy, no chest pain or pressure, and no change in bowel or bladder habits. The patient denies fever, rash, bleeding, unexplained fatigue or unexplained weight loss.  She does housework but does not otherwise exercise regularly.  A detailed review of systems was otherwise entirely negative.   PAST MEDICAL HISTORY: Past Medical History:  Diagnosis Date  . Arrhythmia   . Arthritis   . Chronic kidney disease   . Coronary artery disease   . Diabetes mellitus without complication ([place])   . Diverticulitis   . Hypertension   . Hypothyroidism   . Myocardial infarction ([place])   . Thyroid disease     PAST SURGICAL HISTORY: Past Surgical History:  Procedure Laterality Date  . APPENDECTOMY  1936  . PARS PLANA VITRECTOMY Right 12/04/2018   Procedure: RETINAL DETACHMENT REPAIR PPV 25 GAUGE WITH ENDO LASER AIR/FLUID EXCHANGE SF6 GAS INJECTION;  Surgeon: Jalene Mullet, MD;  Location: [facility];  Service: Ophthalmology;  Laterality: Right;  . TONSILLECTOMY      FAMILY HISTORY: Family  History  Problem Relation Age of Onset  . Arthritis Mother   . Heart  disease Mother   . Hypertension Mother   . Arthritis Father   . Heart attack Father   . Heart disease Father   . Arthritis Daughter   . Arthritis Son   . Depression Maternal Aunt   . Hyperlipidemia Maternal Aunt   . Hypertension Maternal Aunt    The patient's father died at age 77 from cardiac problems.  The patient's mother died at age 103 from sepsis.  The patient had no brothers or sisters.  1 maternal cousin was diagnosed with breast cancer in her 52s.  There is no other breast ovarian pancreatic or prostate cancer in the family to the patient's knowledge  GYNECOLOGIC HISTORY:  No LMP recorded. Patient is postmenopausal. Menarche: 86 years old Age at first live birth: 86 years old [place] P 2 LMP age 54 Contraceptive oral contraceptives for approximately 2 years remotely, without complications HRT no  Hysterectomy? no BSO?  No   SOCIAL HISTORY: (updated 02/2019)  Gearlene worked as a Recruitment consultant.  Her husband Pilar Plate was a Psychiatrist.  They are both retired.  They have been married 31 years 84 as of January 2021.  At home is just the 2 of them.  They moved to this area from [place] to be closer to their daughter Nunzio Cory who works as a Environmental consultant for [employer].  Son Olivia Mackie lives in [place] and works in Land.  The patient has 3 grandchildren and 1 great grandchild.  She identifies herself as Presbyterian    ADVANCED DIRECTIVES: In the absence of any documents to the contrary her husband is her healthcare power of attorney   HEALTH MAINTENANCE: Social History   Tobacco Use  . Smoking status: Former Research scientist (life sciences)  . Smokeless tobacco: Never Used  Substance Use Topics  . Alcohol use: Yes    Comment: Very rare  . Drug use: Never     Colonoscopy: 2019  PAP: Remote  Bone density: Remote; "normal" by patient's account   Allergies  Allergen Reactions  . Tape Itching and Rash    Current Outpatient Medications  Medication Sig Dispense Refill  .  acetaminophen (TYLENOL) 650 MG CR tablet Take 650 mg by mouth every 8 (eight) hours as needed for pain.    Marland Kitchen apixaban (ELIQUIS) 2.5 MG TABS tablet Take 1 tablet (2.5 mg total) by mouth 2 (two) times daily. 180 tablet 3  . aspirin EC 81 MG tablet Take 81 mg by mouth daily.    Marland Kitchen atorvastatin (LIPITOR) 40 MG tablet Take 1 tablet (40 mg total) by mouth daily. 90 tablet 1  . Azelastine-Fluticasone 137-50 MCG/ACT SUSP Place 1 spray into the nose 2 (two) times daily as needed.    Marland Kitchen b complex vitamins capsule Take 1 capsule by mouth daily.    . Biotin 1 MG CAPS Take by mouth.    . carvedilol (COREG) 12.5 MG tablet Take 1 tablet (12.5 mg total) by mouth 2 (two) times daily with a meal. 180 tablet 1  . Cholecalciferol (VITAMIN D3) 50 MCG (2000 UT) TABS Take 1 capsule by mouth daily.    . fenofibrate 160 MG tablet Take 1 tablet (160 mg total) by mouth daily. 90 tablet 1  . Ferrous Sulfate (IRON) 325 (65 Fe) MG TABS Take 1 tablet by mouth daily.    . fluticasone (FLONASE) 50 MCG/ACT nasal spray SPRAY 2 SPRAYS INTO EACH NOSTRIL EVERY DAY 48 mL 2  .  furosemide (LASIX) 20 MG tablet TAKE 1 TABLET BY MOUTH EVERY DAY 90 tablet 0  . hydrALAZINE (APRESOLINE) 50 MG tablet Take 1 tablet (50 mg total) by mouth 3 (three) times daily. 270 tablet 3  . levothyroxine (SYNTHROID) 150 MCG tablet Take 1 tablet (150 mcg total) by mouth daily before breakfast. 90 tablet 1  . magnesium oxide (MAG-OX) 400 MG tablet Take 400 mg by mouth daily.    . nitroGLYCERIN (NITROSTAT) 0.4 MG SL tablet Place 0.4 mg under the tongue every 5 (five) minutes as needed for chest pain.    . prednisoLONE acetate (PRED FORTE) 1 % ophthalmic suspension     . quinapril (ACCUPRIL) 20 MG tablet     . vitamin C (ASCORBIC ACID) 500 MG tablet Take 500 mg by mouth daily.    . isosorbide mononitrate (IMDUR) 30 MG 24 hr tablet Take 1 tablet (30 mg total) by mouth daily. 90 tablet 1   No current facility-administered medications for this visit.     OBJECTIVE: Older white woman who appears stated age  20:   03/16/19 1530  BP: (!) 159/57  Pulse: 72  Resp: 17  Temp: 98.8 F (37.1 C)  SpO2: 96%     Body mass index is 30.86 kg/m.   Wt Readings from Last 3 Encounters:  03/16/19 179 lb 12.8 oz (81.6 kg)  03/10/19 180 lb (81.6 kg)  02/14/19 173 lb 12.8 oz (78.8 kg)      ECOG FS:1 - Symptomatic but completely ambulatory  Ocular: Sclerae unicteric, pupils round and equal Ear-nose-throat: Wearing a mask Lymphatic: No cervical or supraclavicular adenopathy Lungs no rales or rhonchi Heart regular rate and rhythm; 1/6 systolic murmur Abd soft, nontender, positive bowel sounds, no masses palpated MSK no focal spinal tenderness, no joint edema Neuro: non-focal, well-oriented, appropriate affect Breasts: The right breast is unremarkable.  The left breast is status post biopsy with a moderate ecchymosis but no palpable mass.  Both axillae are benign   LAB RESULTS:  CMP     Component Value Date/Time   NA 137 03/16/2019 1509   K 4.0 03/16/2019 1509   CL 101 03/16/2019 1509   CO2 28 03/16/2019 1509   GLUCOSE 162 (H) 03/16/2019 1509   BUN 43 (H) 03/16/2019 1509   CREATININE 2.07 (H) 03/16/2019 1509   CALCIUM 8.7 (L) 03/16/2019 1509   PROT 6.2 (L) 03/16/2019 1509   ALBUMIN 3.5 03/16/2019 1509   AST 22 03/16/2019 1509   ALT 11 03/16/2019 1509   ALKPHOS 28 (L) 03/16/2019 1509   BILITOT 0.4 03/16/2019 1509   GFRNONAA 21 (L) 03/16/2019 1509   GFRAA 24 (L) 03/16/2019 1509    No results found for: TOTALPROTELP, ALBUMINELP, A1GS, A2GS, BETS, BETA2SER, GAMS, MSPIKE, SPEI  Lab Results  Component Value Date   WBC 9.0 03/16/2019   NEUTROABS 6.1 03/16/2019   HGB 9.8 (L) 03/16/2019   HCT 30.6 (L) 03/16/2019   MCV 93.0 03/16/2019   PLT 280 03/16/2019    No results found for: LABCA2  No components found for: MPNTIR443  No results for input(s): INR in the last 168 hours.  No results found for: LABCA2  No results  found for: XVQ008  No results found for: QPY195  No results found for: KDT267  No results found for: CA2729  No components found for: HGQUANT  No results found for: CEA1 / No results found for: CEA1   No results found for: AFPTUMOR  No results found for: [place]  No  results found for: KPAFRELGTCHN, LAMBDASER, KAPLAMBRATIO (kappa/lambda light chains)  No results found for: HGBA, HGBA2QUANT, HGBFQUANT, HGBSQUAN (Hemoglobinopathy evaluation)   No results found for: LDH  No results found for: IRON, TIBC, IRONPCTSAT (Iron and TIBC)  No results found for: FERRITIN  Urinalysis No results found for: COLORURINE, APPEARANCEUR, LABSPEC, PHURINE, GLUCOSEU, HGBUR, BILIRUBINUR, KETONESUR, PROTEINUR, UROBILINOGEN, NITRITE, LEUKOCYTESUR   STUDIES: ECHOCARDIOGRAM COMPLETE  Result Date: 02/16/2019   ECHOCARDIOGRAM REPORT   Patient Name:   KEMORA PINARD Date of Exam: 02/16/2019 Medical Rec #:  [id number]      Height:       64.0 in Accession #:    [id number]     Weight:       173.8 lb Date of Birth:  25-Apr-1929      BSA:          1.84 m Patient Age:    9 years       BP:           160/56 mmHg Patient Gender: F              HR:           94 bpm. Exam Location:  [facility] Procedure: 2D Echo, Cardiac Doppler and Color Doppler Indications:    Cardiomyopathy-ischemic  History:        Patient has prior history of Echocardiogram examinations, most                 recent 10/12/2018. CAD and Previous Myocardial Infarction,                 Abnormal ECG; Risk Factors:Diabetes, Hypertension and                 Dyslipidemia.  Sonographer:    Cardell Peach RDCS (AE) Referring Phys: Gregory  1. Left ventricular ejection fraction, by visual estimation, is 60 to 65%. is mildly increased left ventricular hypertrophy.  2. Left ventricular diastolic parameters are indeterminate.  3. Mildly dilated left ventricular internal cavity size.  4. Global right ventricle has normal systolic  function.The right ventricular size is normal. No increase in right ventricular wall thickness.  5. Left atrial size was severely dilated.  6. Right atrial size was normal.  7. Mild mitral valve prolapse.  8. Severe mitral valve regurgitation. No evidence of mitral stenosis.  9. The tricuspid valve is normal in structure. 10. The aortic valve is normal in structure. Aortic valve regurgitation is not visualized. Mild aortic valve stenosis. 11. The pulmonic valve was normal in structure. Pulmonic valve regurgitation is not visualized. 12. There is dilatation of the ascending aorta measuring 38 mm. 13. Mildly elevated pulmonary artery systolic pressure. 14. The inferior vena cava is normal in size with greater than 50% respiratory variability, suggesting right atrial pressure of 3 mmHg. 15. Severe MR with jet directed posteriorly. Minimal prolaps of the middle segment of the anterior leaflet of the mitral valve noted A2. FINDINGS  Left Ventricle: Left ventricular ejection fraction, by visual estimation, is 60 to 65%. The left ventricle has normal function. The left ventricle has no regional wall motion abnormalities. The left ventricular internal cavity size was mildly dilated left ventricle. There is mildly increased left ventricular hypertrophy. Left ventricular diastolic parameters are indeterminate. Normal left atrial pressure. Right Ventricle: The right ventricular size is normal. No increase in right ventricular wall thickness. Global RV systolic function is has normal systolic function. The tricuspid regurgitant velocity is 2.94 m/s, and  with an assumed right atrial pressure  of 3 mmHg, the estimated right ventricular systolic pressure is mildly elevated at 37.6 mmHg. Left Atrium: Left atrial size was severely dilated. Right Atrium: Right atrial size was normal in size Pericardium: There is no evidence of pericardial effusion. Mitral Valve: The mitral valve is normal in structure. There is mild holosystolic  prolapse of of the mitral valve. Severe mitral valve regurgitation. No evidence of mitral valve stenosis by observation. Severe MR with jet directed posteriorly. Minimal prolaps of the middle segment of the anterior leaflet of the mitral valve noted A2. Tricuspid Valve: The tricuspid valve is normal in structure. Tricuspid valve regurgitation is not demonstrated. Aortic Valve: The aortic valve is normal in structure. Aortic valve regurgitation is not visualized. Mild aortic stenosis is present. Aortic valve mean gradient measures 13.6 mmHg. Aortic valve peak gradient measures 24.2 mmHg. Pulmonic Valve: The pulmonic valve was normal in structure. Pulmonic valve regurgitation is not visualized. Pulmonic regurgitation is not visualized. Aorta: The aortic root, ascending aorta and aortic arch are all structurally normal, with no evidence of dilitation or obstruction. There is dilatation of the ascending aorta measuring 38 mm. Venous: The inferior vena cava is normal in size with greater than 50% respiratory variability, suggesting right atrial pressure of 3 mmHg. IAS/Shunts: No atrial level shunt detected by color flow Doppler. There is no evidence of a patent foramen ovale. No ventricular septal defect is seen or detected. There is no evidence of an atrial septal defect.  LEFT VENTRICLE PLAX 2D LVIDd:         5.40 cm Diastology LVIDs:         3.21 cm LV e' lateral:   7.83 cm/s LV PW:         1.22 cm LV E/e' lateral: 13.9 LV IVS:        1.40 cm LV e' medial:    5.33 cm/s LV SV:         100 ml  LV E/e' medial:  20.5 LV SV Index:   52.32  RIGHT VENTRICLE             IVC RV Basal diam:  3.92 cm     IVC diam: 1.70 cm RV S prime:     19.00 cm/s TAPSE (M-mode): 2.6 cm LEFT ATRIUM           Index       RIGHT ATRIUM           Index LA diam:      4.30 cm 2.33 cm/m  RA Area:     24.50 cm LA Vol (A2C): 88.6 ml 48.07 ml/m RA Volume:   73.00 ml  39.61 ml/m  AORTIC VALVE AV Vmax:           246.20 cm/s AV Vmean:          172.400  cm/s AV VTI:            0.645 m AV Peak Grad:      24.2 mmHg AV Mean Grad:      13.6 mmHg LVOT Vmax:         59.60 cm/s LVOT Vmean:        45.800 cm/s LVOT VTI:          0.177 m LVOT/AV VTI ratio: 0.27  AORTA Ao Root diam: 3.00 cm Ao Asc diam:  3.80 cm MITRAL VALVE  TRICUSPID VALVE MV Area (PHT): 2.91 cm              TR Peak grad:   34.6 mmHg MV PHT:        75.69 msec            TR Vmax:        295.00 cm/s MV Decel Time: 261 msec MR Peak grad:    178.0 mmHg          SHUNTS MR Mean grad:    117.0 mmHg          Systemic VTI: 0.18 m MR Vmax:         667.00 cm/s MR Vmean:        515.0 cm/s MR PISA:         7.60 cm MR PISA Eff ROA: 115 mm MR PISA Radius:  1.10 cm MV E velocity: 109.00 cm/s 103 cm/s MV A velocity: 45.30 cm/s  70.3 cm/s MV E/A ratio:  2.41        1.5  Jenne Campus MD Electronically signed by Jenne Campus MD Signature Date/Time: 02/16/2019/9:40:53 PM    Final    MM CLIP PLACEMENT LEFT  Result Date: 02/23/2019 CLINICAL DATA:  Status post ultrasound-guided core biopsy of LEFT breast mass. EXAM: DIAGNOSTIC LEFT MAMMOGRAM POST ULTRASOUND BIOPSY COMPARISON:  01/26/2019 and earlier FINDINGS: Mammographic images were obtained following ultrasound guided biopsy of mass in the 2 o'clock location of the LEFT breast and placement of a ribbon shaped clip. The biopsy marking clip is in expected position at the site of biopsy. IMPRESSION: Appropriate positioning of the ribbon shaped biopsy marking clip at the site of biopsy in the UPPER-OUTER QUADRANT LEFT breast. Final Assessment: Post Procedure Mammograms for Marker Placement Electronically Signed   By: Nolon Nations M.D.   On: 02/23/2019 15:55   [place] LT BREAST BX W LOC DEV 1ST LESION IMG BX SPEC US GUIDE  Addendum Date: 02/24/2019   ADDENDUM REPORT: 02/24/2019 14:32 ADDENDUM: Pathology revealed GRADE II INVASIVE DUCTAL CARCINOMA WITH EXTRA CELLULAR MUCIN, FOCAL DUCTAL CARCINOMA IN SITU of the LEFT breast, 2 o'clock, ribbon  clip. This was found to be concordant by Dr. Nolon Nations. Pathology results were discussed with the patient by telephone. The patient reported doing well after the biopsy with tenderness at the site. Post biopsy instructions and care were reviewed and questions were answered. The patient was encouraged to call [facility] for any additional concerns. Surgical consultation has been arranged with Dr. Autumn Messing at [facility] on March 02, 2019. Pathology results reported by Stacie Acres, RN on 02/24/2019. Electronically Signed   By: Nolon Nations M.D.   On: 02/24/2019 14:32   Result Date: 02/24/2019 CLINICAL DATA:  Patient returns for ultrasound-guided core biopsy of LEFT breast mass. EXAM: ULTRASOUND GUIDED LEFT BREAST CORE NEEDLE BIOPSY COMPARISON:  Previous exam(s). FINDINGS: I met with the patient and we discussed the procedure of ultrasound-guided biopsy, including benefits and alternatives. We discussed the high likelihood of a successful procedure. We discussed the risks of the procedure, including infection, bleeding, tissue injury, clip migration, and inadequate sampling. Informed written consent was given. The usual time-out protocol was performed immediately prior to the procedure. Lesion quadrant: UPPER-OUTER QUADRANT LEFT breast Using sterile technique and 1% Lidocaine as local anesthetic, under direct ultrasound visualization, a 12 gauge spring-loaded device was used to perform biopsy of serpiginous mass in the 2 o'clock location of the LEFT breast 8 centimeters from  the nipple using a LATERAL to MEDIAL approach. Both components of the lesion were sampled as a single lesion. At the conclusion of the procedure a ribbon shaped tissue marker clip was deployed into the biopsy cavity. Follow up 2 view mammogram was performed and dictated separately. IMPRESSION: Ultrasound guided biopsy of LEFT breast mass. No apparent complications. Electronically Signed: By:  Nolon Nations M.D. On: 02/23/2019 15:42     ELIGIBLE FOR AVAILABLE RESEARCH PROTOCOL:no  ASSESSMENT: 86 y.o. [place] woman status post left breast biopsy for a clinical T1c N0, stage IA invasive ductal carcinoma, with extracellular mucin, grade 2, estrogen and progesterone receptor positive, HER-2 not amplified, with an MIB-1 of 5%  (1) anastrozole started 03/16/2019 given possible surgical delays  (2) breast conserving surgery with no sentinel lymph node sampling planned  (3) consider adjuvant radiation  PLAN: I met today with Chessica to review her new diagnosis. Specifically we discussed the biology of her breast cancer, its diagnosis, staging, treatment  options and prognosis. We first reviewed the fact that cancer is not one disease but more than 100 different diseases and that it is important to keep them separate-- otherwise when friends and relatives discuss their own cancer experiences with [place] confusion can result. Similarly we explained that if breast cancer spreads to the bone or liver, the patient would not have bone cancer or liver cancer, but breast cancer in the bone and breast cancer in the liver: one cancer in three places-- not 3 different cancers which otherwise would have to be treated in 3 different ways.  We discussed the difference between local and systemic therapy. In terms of loco-regional treatment, lumpectomy plus radiation is equivalent to mastectomy as far as survival is concerned. For this reason, and because the cosmetic results are generally superior, we recommend breast conserving surgery.  In addition because of her age and heart issues it would be ideal if this surgery could be done under local which should these days of course also includes a regional block.  She has been discussing this with Dr. Marlou Starks her surgeon  We then went over the rationale for systemic therapy. There is some risk that this cancer may have already spread to other parts of her  body. Patients frequently ask at this point about bone scans, CAT scans and PET scans to find out if they have occult breast cancer somewhere else. The problem is that in early stage disease we are much more likely to find false positives then true cancers and this would expose the patient to unnecessary procedures as well as unnecessary radiation. Scans cannot answer the question the patient really would like to know, which is whether she has microscopic disease elsewhere in her body. For those reasons we do not recommend them.  Of course we would proceed to aggressive evaluation of any symptoms that might suggest metastatic disease, but that is not the case here.  Next we  the options for systemic therapy which are anti-estrogens, anti-HER-2 immunotherapy, and chemotherapy. Harvest does not meet criteria for anti-HER-2 immunotherapy. She is a good candidate for anti-estrogens.  The question of chemotherapy is more complicated. Chemotherapy is most effective in rapidly growing, aggressive tumors. It is much less effective in mucinous, slow growing cancers, like Georgie 's. For that reason we are going to forego Oncotype testing and not plan on sentinel lymph node sampling as it would not affect the choice of systemic treatment.  Janeen is aware that if surgery is successful and she takes antiestrogens  for 5 years there is no survival disadvantage to avoiding radiation.  Because she wishes to postpone surgery until after her second dose of the [place] vaccine which will not be until 04/04/2019, we are starting anastrozole now.  This will also give her an indication of how she may tolerate this medication.  We did discuss the possible toxicities side effects and complications in detail.  She understands this drug does not cause any clotting or bleeding problems as compared with placebo.    Cherlyn has a good understanding of the overall plan. She agrees with it. She knows the goal of treatment in her case  is cure. She will call with any problems that may develop before her next visit here.  Total encounter time 65 minutes.Chauncey Cruel, MD   03/16/2019 4:44 PM Medical Oncology and Hematology [facility] [place], [place] [address] Tel. [phone]    Fax. [phone]   This document serves as a record of services personally performed by Lurline Del, MD. It was created on his behalf by Wilburn Mylar, a trained medical scribe. The creation of this record is based on the scribe's personal observations and the provider's statements to them.   I, Lurline Del MD, have reviewed the above documentation for accuracy and completeness, and I agree with the above.    *Total Encounter Time as defined by the Centers for Medicare and Medicaid Services includes, in addition to the face-to-face time of a patient visit (documented in the note above) non-face-to-face time: obtaining and reviewing outside history, ordering and reviewing medications, tests or procedures, care coordination (communications with other health care professionals or caregivers) and documentation in the medical record.

## 2019-03-16 ENCOUNTER — Other Ambulatory Visit: Payer: Self-pay

## 2019-03-16 ENCOUNTER — Encounter: Payer: Self-pay | Admitting: *Deleted

## 2019-03-16 ENCOUNTER — Inpatient Hospital Stay: Payer: Medicare Other | Attending: Oncology | Admitting: Oncology

## 2019-03-16 ENCOUNTER — Inpatient Hospital Stay: Payer: Medicare Other

## 2019-03-16 VITALS — BP 159/57 | HR 72 | Temp 98.8°F | Resp 17 | Ht 64.0 in | Wt 179.8 lb

## 2019-03-16 DIAGNOSIS — I340 Nonrheumatic mitral (valve) insufficiency: Secondary | ICD-10-CM | POA: Diagnosis not present

## 2019-03-16 DIAGNOSIS — N289 Disorder of kidney and ureter, unspecified: Secondary | ICD-10-CM

## 2019-03-16 DIAGNOSIS — I2510 Atherosclerotic heart disease of native coronary artery without angina pectoris: Secondary | ICD-10-CM

## 2019-03-16 DIAGNOSIS — E785 Hyperlipidemia, unspecified: Secondary | ICD-10-CM | POA: Diagnosis not present

## 2019-03-16 DIAGNOSIS — I480 Paroxysmal atrial fibrillation: Secondary | ICD-10-CM | POA: Insufficient documentation

## 2019-03-16 DIAGNOSIS — Z170 Estrogen receptor positive status [ER+]: Secondary | ICD-10-CM | POA: Insufficient documentation

## 2019-03-16 DIAGNOSIS — E038 Other specified hypothyroidism: Secondary | ICD-10-CM

## 2019-03-16 DIAGNOSIS — E1169 Type 2 diabetes mellitus with other specified complication: Secondary | ICD-10-CM

## 2019-03-16 DIAGNOSIS — N189 Chronic kidney disease, unspecified: Secondary | ICD-10-CM | POA: Diagnosis not present

## 2019-03-16 DIAGNOSIS — I252 Old myocardial infarction: Secondary | ICD-10-CM | POA: Diagnosis not present

## 2019-03-16 DIAGNOSIS — I158 Other secondary hypertension: Secondary | ICD-10-CM | POA: Insufficient documentation

## 2019-03-16 DIAGNOSIS — C50412 Malignant neoplasm of upper-outer quadrant of left female breast: Secondary | ICD-10-CM

## 2019-03-16 DIAGNOSIS — E1159 Type 2 diabetes mellitus with other circulatory complications: Secondary | ICD-10-CM

## 2019-03-16 LAB — CMP (CANCER CENTER ONLY)
ALT: 11 U/L (ref 0–44)
AST: 22 U/L (ref 15–41)
Albumin: 3.5 g/dL (ref 3.5–5.0)
Alkaline Phosphatase: 28 U/L — ABNORMAL LOW (ref 38–126)
Anion gap: 8 (ref 5–15)
BUN: 43 mg/dL — ABNORMAL HIGH (ref 8–23)
CO2: 28 mmol/L (ref 22–32)
Calcium: 8.7 mg/dL — ABNORMAL LOW (ref 8.9–10.3)
Chloride: 101 mmol/L (ref 98–111)
Creatinine: 2.07 mg/dL — ABNORMAL HIGH (ref 0.44–1.00)
GFR, Est AFR Am: 24 mL/min — ABNORMAL LOW (ref >60)
GFR, Estimated: 21 mL/min — ABNORMAL LOW (ref >60)
Glucose, Bld: 162 mg/dL — ABNORMAL HIGH (ref 70–99)
Potassium: 4 mmol/L (ref 3.5–5.1)
Sodium: 137 mmol/L (ref 135–145)
Total Bilirubin: 0.4 mg/dL (ref 0.3–1.2)
Total Protein: 6.2 g/dL — ABNORMAL LOW (ref 6.5–8.1)

## 2019-03-16 LAB — CBC WITH DIFFERENTIAL (CANCER CENTER ONLY)
Abs Immature Granulocytes: 0.03 10*3/uL (ref 0.00–0.07)
Basophils Absolute: 0.1 10*3/uL (ref 0.0–0.1)
Basophils Relative: 1 %
Eosinophils Absolute: 0.2 10*3/uL (ref 0.0–0.5)
Eosinophils Relative: 2 %
HCT: 30.6 % — ABNORMAL LOW (ref 36.0–46.0)
Hemoglobin: 9.8 g/dL — ABNORMAL LOW (ref 12.0–15.0)
Immature Granulocytes: 0 %
Lymphocytes Relative: 21 %
Lymphs Abs: 1.9 10*3/uL (ref 0.7–4.0)
MCH: 29.8 pg (ref 26.0–34.0)
MCHC: 32 g/dL (ref 30.0–36.0)
MCV: 93 fL (ref 80.0–100.0)
Monocytes Absolute: 0.8 10*3/uL (ref 0.1–1.0)
Monocytes Relative: 9 %
Neutro Abs: 6.1 10*3/uL (ref 1.7–7.7)
Neutrophils Relative %: 67 %
Platelet Count: 280 10*3/uL (ref 150–400)
RBC: 3.29 MIL/uL — ABNORMAL LOW (ref 3.87–5.11)
RDW: 14.2 % (ref 11.5–15.5)
WBC Count: 9 10*3/uL (ref 4.0–10.5)
nRBC: 0 % (ref 0.0–0.2)

## 2019-03-17 ENCOUNTER — Telehealth: Payer: Self-pay | Admitting: Oncology

## 2019-03-17 NOTE — Telephone Encounter (Signed)
I left a message regarding schedule  

## 2019-03-18 ENCOUNTER — Other Ambulatory Visit: Payer: Self-pay | Admitting: *Deleted

## 2019-03-18 MED ORDER — ANASTROZOLE 1 MG PO TABS: 1.0000 mg | ORAL_TABLET | Freq: Every day | ORAL | 3 refills | Status: DC

## 2019-03-26 ENCOUNTER — Other Ambulatory Visit: Payer: Self-pay | Admitting: Oncology

## 2019-04-07 ENCOUNTER — Encounter: Payer: Self-pay | Admitting: Cardiology

## 2019-04-07 ENCOUNTER — Telehealth (INDEPENDENT_AMBULATORY_CARE_PROVIDER_SITE_OTHER): Payer: Medicare Other | Admitting: Cardiology

## 2019-04-07 ENCOUNTER — Telehealth: Payer: Self-pay | Admitting: *Deleted

## 2019-04-07 ENCOUNTER — Other Ambulatory Visit: Payer: Self-pay

## 2019-04-07 VITALS — BP 100/57 | HR 50 | Ht 64.0 in | Wt 174.0 lb

## 2019-04-07 DIAGNOSIS — I340 Nonrheumatic mitral (valve) insufficiency: Secondary | ICD-10-CM | POA: Diagnosis not present

## 2019-04-07 DIAGNOSIS — I480 Paroxysmal atrial fibrillation: Secondary | ICD-10-CM

## 2019-04-07 DIAGNOSIS — I10 Essential (primary) hypertension: Secondary | ICD-10-CM

## 2019-04-07 DIAGNOSIS — I2510 Atherosclerotic heart disease of native coronary artery without angina pectoris: Secondary | ICD-10-CM

## 2019-04-07 DIAGNOSIS — E1159 Type 2 diabetes mellitus with other circulatory complications: Secondary | ICD-10-CM

## 2019-04-07 NOTE — Progress Notes (Signed)
Virtual Visit via Video Note   This visit type was conducted due to national recommendations for restrictions regarding the COVID-19 Pandemic (e.g. social distancing) in an effort to limit this patient's exposure and mitigate transmission in our community.  Due to her co-morbid illnesses, this patient is at least at moderate risk for complications without adequate follow up.  This format is felt to be most appropriate for this patient at this time.  All issues noted in this document were discussed and addressed.  A limited physical exam was performed with this format.  Please refer to the patient's chart for her consent to telehealth for [facility].  Evaluation Performed:  Follow-up visit  This visit type was conducted due to national recommendations for restrictions regarding the COVID-19 Pandemic (e.g. social distancing).  This format is felt to be most appropriate for this patient at this time.  All issues noted in this document were discussed and addressed.  No physical exam was performed (except for noted visual exam findings with Video Visits).  Please refer to the patient's chart (MyChart message for video visits and phone note for telephone visits) for the patient's consent to telehealth for [facility].  Date:  04/07/2019  ID: Frutoso Schatz, DOB [birth year], MRN [id number]   Patient Location: [place] [address]   Provider location:   [place] [facility]  PCP:  Midge Minium, MD  Cardiologist:  Jenne Campus, MD     Chief Complaint: Doing well  History of Present Illness:    Lauren Walker is a 86 y.o. female  who presents via audio/video conferencing for a telehealth visit today.  Past medical history significant for remote coronary artery disease, mitral regurgitation related to posterior leaflet mitral valve prolapse which is being assessed as severe, mild aortic stenosis, diabetes.  Recently she was diagnosed with malignant breast  cancer surgery is contemplated then she was sent to [place] for evaluation before the surgery.  Overall she is doing fair she says she is able to walk around and do things that she wants.  Described to have some shortness of breath but there is no significant changes within last year.  Also described to have some swelling of lower extremities but no significant changes within the year.  Denies having any palpitation, no tightness no pressure no squeezing no burning in the chest.  She does not have paroxysmal nocturnal dyspnea.   The patient does not have symptoms concerning for COVID-19 infection (fever, chills, cough, or new SHORTNESS OF BREATH).    Prior CV studies:   The following studies were reviewed today:  Echocardiogram from February 16, 2019 showed: 1. Left ventricular ejection fraction, by visual estimation, is 60 to  65%. is mildly increased left ventricular hypertrophy.  2. Left ventricular diastolic parameters are indeterminate.  3. Mildly dilated left ventricular internal cavity size.  4. Global right ventricle has normal systolic function.The right  ventricular size is normal. No increase in right ventricular wall  thickness.  5. Left atrial size was severely dilated.  6. Right atrial size was normal.  7. Mild mitral valve prolapse.  8. Severe mitral valve regurgitation. No evidence of mitral stenosis.  9. The tricuspid valve is normal in structure.  10. The aortic valve is normal in structure. Aortic valve regurgitation is  not visualized. Mild aortic valve stenosis.  11. The pulmonic valve was normal in structure. Pulmonic valve  regurgitation is not visualized.  12. There is dilatation of  the ascending aorta measuring 38 mm.  13. Mildly elevated pulmonary artery systolic pressure.  14. The inferior vena cava is normal in size with greater than 50%  respiratory variability, suggesting right atrial pressure of 3 mmHg.  15. Severe MR with jet directed posteriorly.  Minimal prolaps of the middle  segment of the anterior leaflet of the mitral valve noted A2.      Past Medical History:  Diagnosis Date  . Arrhythmia   . Arthritis   . Chronic kidney disease   . Coronary artery disease   . Diabetes mellitus without complication ([place])   . Diverticulitis   . Hypertension   . Hypothyroidism   . Myocardial infarction ([place])   . Thyroid disease     Past Surgical History:  Procedure Laterality Date  . APPENDECTOMY  1936  . PARS PLANA VITRECTOMY Right 12/04/2018   Procedure: RETINAL DETACHMENT REPAIR PPV 25 GAUGE WITH ENDO LASER AIR/FLUID EXCHANGE SF6 GAS INJECTION;  Surgeon: Jalene Mullet, MD;  Location: [facility];  Service: Ophthalmology;  Laterality: Right;  . TONSILLECTOMY       Current Meds  Medication Sig  . acetaminophen (TYLENOL) 650 MG CR tablet Take 650 mg by mouth every 8 (eight) hours as needed for pain.  Marland Kitchen anastrozole (ARIMIDEX) 1 MG tablet Take 1 tablet (1 mg total) by mouth daily.  Marland Kitchen apixaban (ELIQUIS) 2.5 MG TABS tablet Take 1 tablet (2.5 mg total) by mouth 2 (two) times daily.  Marland Kitchen aspirin EC 81 MG tablet Take 81 mg by mouth daily.  Marland Kitchen atorvastatin (LIPITOR) 40 MG tablet Take 1 tablet (40 mg total) by mouth daily.  . Azelastine-Fluticasone 137-50 MCG/ACT SUSP Place 1 spray into the nose 2 (two) times daily as needed.  Marland Kitchen b complex vitamins capsule Take 1 capsule by mouth daily.  . Biotin 1 MG CAPS Take by mouth.  . carvedilol (COREG) 12.5 MG tablet Take 1 tablet (12.5 mg total) by mouth 2 (two) times daily with a meal.  . Cholecalciferol (VITAMIN D3) 50 MCG (2000 UT) TABS Take 1 capsule by mouth daily.  . fenofibrate 160 MG tablet Take 1 tablet (160 mg total) by mouth daily.  . Ferrous Sulfate (IRON) 325 (65 Fe) MG TABS Take 1 tablet by mouth daily.  . fluticasone (FLONASE) 50 MCG/ACT nasal spray SPRAY 2 SPRAYS INTO EACH NOSTRIL EVERY DAY  . furosemide (LASIX) 20 MG tablet TAKE 1 TABLET BY MOUTH EVERY DAY  . hydrALAZINE (APRESOLINE) 50  MG tablet Take 1 tablet (50 mg total) by mouth 3 (three) times daily.  . isosorbide mononitrate (IMDUR) 30 MG 24 hr tablet Take 1 tablet (30 mg total) by mouth daily.  Marland Kitchen levothyroxine (SYNTHROID) 150 MCG tablet Take 1 tablet (150 mcg total) by mouth daily before breakfast.  . magnesium oxide (MAG-OX) 400 MG tablet Take 400 mg by mouth daily.  . nitroGLYCERIN (NITROSTAT) 0.4 MG SL tablet Place 0.4 mg under the tongue every 5 (five) minutes as needed for chest pain.  Marland Kitchen quinapril (ACCUPRIL) 20 MG tablet Take 20 mg by mouth daily.   . vitamin C (ASCORBIC ACID) 500 MG tablet Take 500 mg by mouth daily.      Family History: The patient's family history includes Arthritis in her daughter, father, mother, and son; Depression in her maternal aunt; Heart attack in her father; Heart disease in her father and mother; Hyperlipidemia in her maternal aunt; Hypertension in her maternal aunt and mother.   ROS:   Please see the history of  present illness.     All other systems reviewed and are negative.   Labs/Other Tests and Data Reviewed:     Recent Labs: 12/29/2018: TSH 1.35 03/16/2019: ALT 11; BUN 43; Creatinine 2.07; Hemoglobin 9.8; Platelet Count 280; Potassium 4.0; Sodium 137  Recent Lipid Panel    Component Value Date/Time   CHOL 109 12/29/2018 1415   TRIG 154.0 (H) 12/29/2018 1415   HDL 48.40 12/29/2018 1415   CHOLHDL 2 12/29/2018 1415   VLDL 30.8 12/29/2018 1415   LDLCALC 30 12/29/2018 1415      Exam:    Vital Signs:  BP (!) 100/57   Pulse (!) 50   Ht 5\' 4"  (1.626 m)   Wt 174 lb (78.9 kg)   BMI 29.87 kg/m     Wt Readings from Last 3 Encounters:  04/07/19 174 lb (78.9 kg)  03/16/19 179 lb 12.8 oz (81.6 kg)  03/10/19 180 lb (81.6 kg)     Well nourished, well developed in no acute distress. We talking over the video link.  She is at home and I am home in [place].  There is an icy weather today and that is why our office is closed.  Overall she seems to be doing well.  She  is cheerful and happy to be able to talk to me.  Denies have any symptoms.  Diagnosis for this visit:   1. Nonrheumatic mitral valve regurgitation   2. Essential hypertension   3. Type 2 diabetes mellitus with other circulatory complications ([place])   4. Coronary artery disease involving native coronary artery of native heart without angina pectoris   5. Paroxysmal atrial fibrillation ([facility])      ASSESSMENT & PLAN:    1.  Nonrheumatic mitral valve regurgitation moderate to severe based on latest estimation.  The mechanism is most likely posterior leaflet mitral valve prolapse, mild elevation of the pulmonary artery pressure of 37.6 mmHg.  Her [place] Heart Association is difficult to judge to look like she is 2/3.  We talked about options for this situation options include surgical intervention including open heart surgery, as well as potentially mitral valve clip.  She said she wants to think about this.  And she is more concerned about her breast cancer and mitral valve issue.  She said nothing change in her clinical status within the last 6 to 12 months, therefore she will not postpone any potential intervention until breast surgery will be done. 2.  Essential hypertension.  Blood pressure controlled continue present management.  She actually tells me that her blood pressure slightly low today.  110/60. 3.  Type 2 diabetes stable followed by antimedicine team. 4.  Coronary artery disease remote no recent problems. 5.  Paroxysmal atrial fibrillation anticoagulated which I will continue.  Her chads 2 vascular equals 5 6.  Preop evaluation for breast surgery.  It looks like surgery is planned under regional block, that should be acceptable from cardiac standpoint of view.  Obviously we need to pay special meticulous attention to her hemodynamics.  In terms of anticoagulation, anticoagulation can be held before surgery for about 24 to 48 hours.  Should be restarted as soon as possible  thereafter.Marland Kitchen  COVID-19 Education: The signs and symptoms of COVID-19 were discussed with the patient and how to seek care for testing (follow up with PCP or arrange E-visit).  The importance of social distancing was discussed today.  Patient Risk:   After full review of this patients clinical status, I feel that  they are at least moderate risk at this time.  Time:   Today, I have spent 5 minutes with the patient with telehealth technology discussing pt health issues.  I spent 22 minutes reviewing her chart before the visit.  Visit was finished at 11:30 AM.    Medication Adjustments/Labs and Tests Ordered: Current medicines are reviewed at length with the patient today.  Concerns regarding medicines are outlined above.  No orders of the defined types were placed in this encounter.  Medication changes: No orders of the defined types were placed in this encounter.    Disposition: Follow-up 3 months  Signed, Park Liter, MD, [facility] 04/07/2019 11:24 AM    [place]

## 2019-04-07 NOTE — Telephone Encounter (Signed)

## 2019-04-07 NOTE — Patient Instructions (Signed)

## 2019-04-08 ENCOUNTER — Ambulatory Visit: Payer: Medicare Other | Admitting: Podiatry

## 2019-04-11 ENCOUNTER — Encounter: Payer: Self-pay | Admitting: *Deleted

## 2019-04-21 ENCOUNTER — Encounter: Payer: Self-pay | Admitting: *Deleted

## 2019-04-29 ENCOUNTER — Ambulatory Visit (INDEPENDENT_AMBULATORY_CARE_PROVIDER_SITE_OTHER): Payer: Medicare Other | Admitting: Family Medicine

## 2019-04-29 ENCOUNTER — Encounter: Payer: Self-pay | Admitting: Family Medicine

## 2019-04-29 ENCOUNTER — Other Ambulatory Visit: Payer: Self-pay

## 2019-04-29 DIAGNOSIS — C50412 Malignant neoplasm of upper-outer quadrant of left female breast: Secondary | ICD-10-CM | POA: Diagnosis not present

## 2019-04-29 DIAGNOSIS — Z170 Estrogen receptor positive status [ER+]: Secondary | ICD-10-CM

## 2019-04-29 DIAGNOSIS — I2510 Atherosclerotic heart disease of native coronary artery without angina pectoris: Secondary | ICD-10-CM

## 2019-04-29 NOTE — Progress Notes (Signed)
I have discussed the procedure for the virtual visit with the patient who has given consent to proceed with assessment and treatment.   Pt unable to obtain vitals.   Jessica L Brodmerkel, CMA     

## 2019-04-29 NOTE — Progress Notes (Signed)
Virtual Visit via Video   I connected with patient on 04/29/19 at  3:00 PM EST by a video enabled telemedicine application and verified that I am speaking with the correct person using two identifiers.  Location patient: Home Location provider: [facility], Office Persons participating in the virtual visit: Patient, Provider, [place] (Jess B)  I discussed the limitations of evaluation and management by telemedicine and the availability of in person appointments. The patient expressed understanding and agreed to proceed.  Subjective:   HPI:   Surgery discussion- pt was dx'd w/ breast cancer in January.  She spoke to both the surgeon and the oncologist and they both felt that her breast cancer could be managed w/ anti-estrogen medication.  Cardiology feels that breast surgery would need to be done under local anesthesia.  Pt just wants to talk about the pros and cons of each option b/c she's not sure how to proceed  ROS:   See pertinent positives and negatives per HPI.  Patient Active Problem List   Diagnosis Date Noted  . Malignant neoplasm of upper-outer quadrant of left breast in female, estrogen receptor positive ([place]) 03/02/2019  . Aortic stenosis 02/14/2019  . Renal insufficiency 12/29/2018  . Mitral regurgitation moderate to severe based on echocardiogram in summer 2020 11/04/2018  . Paroxysmal atrial fibrillation ([place]) 10/11/2018  . Hyperlipidemia associated with type 2 diabetes mellitus ([place]) 08/16/2018  . Type 2 diabetes mellitus with other circulatory complications ([place]) 59/56/3875  . Hypothyroid 08/16/2018  . HTN (hypertension) 08/16/2018  . CAD (coronary artery disease) 08/16/2018  . History of MI (myocardial infarction) 08/16/2018    Social History   Tobacco Use  . Smoking status: Former Research scientist (life sciences)  . Smokeless tobacco: Never Used  Substance Use Topics  . Alcohol use: Yes    Comment: Very rare    Current Outpatient Medications:  .  acetaminophen (TYLENOL)  650 MG CR tablet, Take 650 mg by mouth every 8 (eight) hours as needed for pain., Disp: , Rfl:  .  anastrozole (ARIMIDEX) 1 MG tablet, Take 1 tablet (1 mg total) by mouth daily., Disp: 30 tablet, Rfl: 3 .  apixaban (ELIQUIS) 2.5 MG TABS tablet, Take 1 tablet (2.5 mg total) by mouth 2 (two) times daily., Disp: 180 tablet, Rfl: 3 .  aspirin EC 81 MG tablet, Take 81 mg by mouth daily., Disp: , Rfl:  .  atorvastatin (LIPITOR) 40 MG tablet, Take 1 tablet (40 mg total) by mouth daily., Disp: 90 tablet, Rfl: 1 .  Azelastine-Fluticasone 137-50 MCG/ACT SUSP, Place 1 spray into the nose 2 (two) times daily as needed., Disp: , Rfl:  .  b complex vitamins capsule, Take 1 capsule by mouth daily., Disp: , Rfl:  .  Biotin 1 MG CAPS, Take by mouth., Disp: , Rfl:  .  carvedilol (COREG) 12.5 MG tablet, Take 1 tablet (12.5 mg total) by mouth 2 (two) times daily with a meal., Disp: 180 tablet, Rfl: 1 .  Cholecalciferol (VITAMIN D3) 50 MCG (2000 UT) TABS, Take 1 capsule by mouth daily., Disp: , Rfl:  .  fenofibrate 160 MG tablet, Take 1 tablet (160 mg total) by mouth daily., Disp: 90 tablet, Rfl: 1 .  Ferrous Sulfate (IRON) 325 (65 Fe) MG TABS, Take 1 tablet by mouth daily., Disp: , Rfl:  .  fluticasone (FLONASE) 50 MCG/ACT nasal spray, SPRAY 2 SPRAYS INTO EACH NOSTRIL EVERY DAY, Disp: 48 mL, Rfl: 2 .  furosemide (LASIX) 20 MG tablet, TAKE 1 TABLET BY MOUTH EVERY  DAY, Disp: 90 tablet, Rfl: 0 .  hydrALAZINE (APRESOLINE) 50 MG tablet, Take 1 tablet (50 mg total) by mouth 3 (three) times daily., Disp: 270 tablet, Rfl: 3 .  isosorbide mononitrate (IMDUR) 30 MG 24 hr tablet, Take 1 tablet (30 mg total) by mouth daily., Disp: 90 tablet, Rfl: 1 .  levothyroxine (SYNTHROID) 150 MCG tablet, Take 1 tablet (150 mcg total) by mouth daily before breakfast., Disp: 90 tablet, Rfl: 1 .  magnesium oxide (MAG-OX) 400 MG tablet, Take 400 mg by mouth daily., Disp: , Rfl:  .  nitroGLYCERIN (NITROSTAT) 0.4 MG SL tablet, Place 0.4 mg under  the tongue every 5 (five) minutes as needed for chest pain., Disp: , Rfl:  .  quinapril (ACCUPRIL) 20 MG tablet, Take 20 mg by mouth daily. , Disp: , Rfl:  .  vitamin C (ASCORBIC ACID) 500 MG tablet, Take 500 mg by mouth daily., Disp: , Rfl:   Allergies  Allergen Reactions  . Tape Itching and Rash    Objective:   There were no vitals taken for this visit.  AAOx3, NAD NCAT, EOMI No obvious CN deficits Coloring WNL Pt is able to speak clearly, coherently without shortness of breath or increased work of breathing.  Thought process is linear.  Mood is appropriate.   Assessment and Plan:   Breast cancer- had long talk w/ pt and husband regarding her recent dx of breast cancer.  She states that oncology told her she could take an anti-estrogen medication and this would likely allow multiple years.  Given her multiple medical issues, if she has surgery it would have to be done under local anesthesia.  I told her that in order to make an informed decision she would want to know how they intended to sedate her, subsequently manage her pain, and ultimately what would her restrictions be and for how long.  We discussed the importance of quality over quantity of years at this point and she whole heartedly agreed.  I told her that if the surgery was going to pose unnecessary risk or impose restrictions on what she is able to do with her family, that medical management alone would be my recommendation.  Also, if medical management is able to get her 5 good years, that will take her to 77.  Pt is going to ask surgeon the questions above to move forward with her decision.  Offered to assist again in any way.  Total time spent w/ pt, 35 minutes and >50% spent counseling    Annye Asa, MD 04/29/2019

## 2019-05-16 ENCOUNTER — Other Ambulatory Visit: Payer: Self-pay | Admitting: *Deleted

## 2019-05-16 DIAGNOSIS — C50412 Malignant neoplasm of upper-outer quadrant of left female breast: Secondary | ICD-10-CM

## 2019-05-16 NOTE — Progress Notes (Signed)
[place]  [phone] [phone]     ID: Lauren Walker DOB: 1930/02/07  MR#: [id number]  SLP#:[id number]  Patient Care Team: Midge Minium, MD as PCP - General (Family Medicine) Walker Liter, MD as Consulting Physician (Cardiology) Jalene Mullet, MD as Consulting Physician (Ophthalmology) Madelon Lips, MD as Consulting Physician (Nephrology) Mauro Kaufmann, RN as Oncology Nurse Navigator Rockwell Germany, RN as Oncology Nurse Navigator Zeplin Aleshire, Virgie Dad, MD as Consulting Physician (Oncology) Jovita Kussmaul, MD as Consulting Physician (General Surgery) Chauncey Cruel, MD OTHER MD:  CHIEF COMPLAINT: estrogen receptor positive breast cancer  CURRENT TREATMENT: anastrozole; definitive surgery pending   INTERVAL HISTORY: Lauren Walker returns today for follow up of her estrogen receptor positive breast cancer accompanied by her granddaughter Lauren Walker.. She was evaluated in the breast cancer clinic on 03/16/2019.  She was started on anastrozole at that time due to anticipated surgical delays.  In fact the patient tells me that she would like to delay surgery as long as it is possible to do that.  Meanwhile she is tolerating tamoxifen with no hot flashes vaginal dryness or other concerns.  She obtains it under good price.   REVIEW OF SYSTEMS: Lauren Walker has had her Covid shots and did very well with them.  She is delighted with her first great grandchild, Lauren Walker's 43-monthold daughter.  The patient has felt more short of breath lately and more fatigued.  She has not had chest pain and has not fallen or fainted.  She is not aware of any bleeding though she has significant bruising (senile purpura).  A detailed review of systems was otherwise stable.   HISTORY OF CURRENT ILLNESS: From the original intake note:  [name] routine screening mammography on 01/10/2019 showing a possible abnormality in the left breast. She underwent left  diagnostic mammography with tomography and [facility] showing: breast density category B; there was a 1.1 cm left breast mass at 2 o'clock 8 cm from the nipple with irregular mixed echogenicity and internal blood flow.  There were two adjacent, oval masses measuring 1 cm and 1.2 cm; taking all 3 masses together totaled approximately 2 cm.  Left axilla is negative for adenopathy.  Accordingly on 02/23/2019 she proceeded to biopsy of the 2:00 o'clock left breast area in question. The pathology from this procedure ([id number]) showed: invasive ductal carcinoma with extracellular mucin, grade 2, with focal ductal carcinoma in situ. Prognostic indicators significant for: estrogen receptor, 100% positive and progesterone receptor, 100% positive, both with strong staining intensity. Proliferation marker Ki67 at 5%. HER2 negative by immunohistochemistry (1+).  The patient's subsequent history is as detailed below.   PAST MEDICAL HISTORY: Past Medical History:  Diagnosis Date  . Arrhythmia   . Arthritis   . Chronic kidney disease   . Coronary artery disease   . Diabetes mellitus without complication ([facility]   . Diverticulitis   . Hypertension   . Hypothyroidism   . [place]ocardial infarction ([facility]   . Thyroid disease     PAST SURGICAL HISTORY: Past Surgical History:  Procedure Laterality Date  . APPENDECTOMY  1936  . PARS PLANA VITRECTOMY Right 12/04/2018   Procedure: RETINAL DETACHMENT REPAIR PPV 25 GAUGE WITH ENDO LASER AIR/FLUID EXCHANGE SF6 GAS INJECTION;  Surgeon: PJalene Mullet MD;  [facility]  Service: Ophthalmology;  Laterality: Right;  . TONSILLECTOMY      FAMILY HISTORY: Family History  Problem Relation Age of Onset  .  Arthritis Mother   . Heart disease Mother   . Hypertension Mother   . Arthritis Father   . Heart attack Father   . Heart disease Father   . Arthritis Daughter   . Arthritis Son   . Depression Maternal Aunt   .  Hyperlipidemia Maternal Aunt   . Hypertension Maternal Aunt    The patient's father died at age 2 from cardiac problems.  The patient's mother died at age 56 from sepsis.  The patient had no brothers or sisters.  1 maternal cousin was diagnosed with breast cancer in her 63s.  There is no other breast ovarian pancreatic or prostate cancer in the family to the patient's knowledge   GYNECOLOGIC HISTORY:  No LMP recorded. Patient is postmenopausal. Menarche: 86 years old Age at first live birth: 86 years old [place] P 2 LMP age 74 Contraceptive oral contraceptives for approximately 2 years remotely, without complications HRT no  Hysterectomy? no BSO?  No   SOCIAL HISTORY: (updated 02/2019)  [name] worked as a Recruitment consultant.  Her husband Lauren Walker was a Psychiatrist.  They are both retired.  They have been married 9 years (as of January 2021).  At home is just the 2 of them.  They moved to this area from [place] to be closer to their daughter Lauren Walker who works as a Environmental consultant for [employer].  Son Lauren Walker lives in [place] and works in Land.  The patient has 3 grandchildren and 1 great grandchild.  She identifies herself as Lauren Walker    ADVANCED DIRECTIVES: In the absence of any documents to the contrary her husband is her healthcare power of attorney   HEALTH MAINTENANCE: Social History   Tobacco Use  . Smoking status: Former Research scientist (life sciences)  . Smokeless tobacco: Never Used  Substance Use Topics  . Alcohol use: Yes    Comment: Very rare  . Drug use: Never     Colonoscopy: 2019  PAP: Remote  Bone density: Remote; "normal" by patient's account   Allergies  Allergen Reactions  . Tape Itching and Rash    Current Outpatient Medications  Medication Sig Dispense Refill  . acetaminophen (TYLENOL) 650 MG CR tablet Take 650 mg by mouth every 8 (eight) hours as needed for pain.    Marland Kitchen anastrozole (ARIMIDEX) 1 MG tablet Take 1 tablet (1 mg total) by mouth daily. 30  tablet 3  . apixaban (ELIQUIS) 2.5 MG TABS tablet Take 1 tablet (2.5 mg total) by mouth 2 (two) times daily. 180 tablet 3  . aspirin EC 81 MG tablet Take 81 mg by mouth daily.    Marland Kitchen atorvastatin (LIPITOR) 40 MG tablet Take 1 tablet (40 mg total) by mouth daily. 90 tablet 1  . Azelastine-Fluticasone 137-50 MCG/ACT SUSP Place 1 spray into the nose 2 (two) times daily as needed.    Marland Kitchen b complex vitamins capsule Take 1 capsule by mouth daily.    . Biotin 1 MG CAPS Take by mouth.    . carvedilol (COREG) 12.5 MG tablet Take 1 tablet (12.5 mg total) by mouth 2 (two) times daily with a meal. 180 tablet 1  . Cholecalciferol (VITAMIN D3) 50 MCG (2000 UT) TABS Take 1 capsule by mouth daily.    . fenofibrate 160 MG tablet Take 1 tablet (160 mg total) by mouth daily. 90 tablet 1  . Ferrous Sulfate (IRON) 325 (65 Fe) MG TABS Take 1 tablet by mouth daily.    . fluticasone (FLONASE) 50 MCG/ACT nasal spray  SPRAY 2 SPRAYS INTO EACH NOSTRIL EVERY DAY 48 mL 2  . furosemide (LASIX) 20 MG tablet TAKE 1 TABLET BY MOUTH EVERY DAY 90 tablet 0  . hydrALAZINE (APRESOLINE) 50 MG tablet Take 1 tablet (50 mg total) by mouth 3 (three) times daily. 270 tablet 3  . isosorbide mononitrate (IMDUR) 30 MG 24 hr tablet Take 1 tablet (30 mg total) by mouth daily. 90 tablet 1  . levothyroxine (SYNTHROID) 150 MCG tablet Take 1 tablet (150 mcg total) by mouth daily before breakfast. 90 tablet 1  . magnesium oxide (MAG-OX) 400 MG tablet Take 400 mg by mouth daily.    . nitroGLYCERIN (NITROSTAT) 0.4 MG SL tablet Place 0.4 mg under the tongue every 5 (five) minutes as needed for chest pain.    Marland Kitchen quinapril (ACCUPRIL) 20 MG tablet Take 20 mg by mouth daily.     . vitamin C (ASCORBIC ACID) 500 MG tablet Take 500 mg by mouth daily.     No current facility-administered medications for this visit.    OBJECTIVE: white woman who appears stated age  78:   05/17/19 1524  BP: (!) 132/95  Pulse: 76  Resp: 18  Temp: 98.6 F (37 C)  SpO2:  96%     Body mass index is 30.11 kg/m.   Wt Readings from Last 3 Encounters:  05/17/19 175 lb 6.4 oz (79.6 kg)  04/07/19 174 lb (78.9 kg)  03/16/19 179 lb 12.8 oz (81.6 kg)      ECOG FS:1 - Symptomatic but completely ambulatory  Sclerae unicteric, EOMs intact Wearing a mask No cervical or supraclavicular adenopathy Lungs no rales or rhonchi Heart regular rate and rhythm Abd soft, nontender, positive bowel sounds MSK mild kyphosis but no focal spinal tenderness, no upper extremity lymphedema Neuro: nonfocal, well oriented, appropriate affect Breasts: I do not palpate a mass in either breast.  There are no nipple or skin changes of concern.  Both axillae are benign.   LAB RESULTS:  CMP     Component Value Date/Time   NA 138 05/17/2019 1453   K 4.2 05/17/2019 1453   CL 104 05/17/2019 1453   CO2 30 05/17/2019 1453   GLUCOSE 178 (H) 05/17/2019 1453   BUN 43 (H) 05/17/2019 1453   CREATININE 2.47 (H) 05/17/2019 1453   CALCIUM 8.8 (L) 05/17/2019 1453   PROT 5.9 (L) 05/17/2019 1453   ALBUMIN 3.3 (L) 05/17/2019 1453   AST 22 05/17/2019 1453   ALT 14 05/17/2019 1453   ALKPHOS 23 (L) 05/17/2019 1453   BILITOT 0.5 05/17/2019 1453   GFRNONAA 17 (L) 05/17/2019 1453   GFRAA 20 (L) 05/17/2019 1453    No results found for: TOTALPROTELP, ALBUMINELP, A1GS, A2GS, BETS, BETA2SER, GAMS, MSPIKE, SPEI  Lab Results  Component Value Date   WBC 6.6 05/17/2019   NEUTROABS 4.3 05/17/2019   HGB 9.4 (L) 05/17/2019   HCT 30.8 (L) 05/17/2019   MCV 97.5 05/17/2019   PLT 269 05/17/2019    No results found for: LABCA2  No components found for: TOIZTI458  No results for input(s): INR in the last 168 hours.  No results found for: LABCA2  No results found for: KDX833  No results found for: ASN053  No results found for: ZJQ734  No results found for: CA2729  No components found for: HGQUANT  No results found for: CEA1 / No results found for: CEA1  No results found for:  AFPTUMOR  No results found for: [place]  No results found for:  HGBA, HGBA2QUANT, HGBFQUANT, HGBSQUAN (Hemoglobinopathy evaluation)   No results found for: LDH  No results found for: IRON, TIBC, IRONPCTSAT (Iron and TIBC)  No results found for: FERRITIN  Urinalysis No results found for: COLORURINE, APPEARANCEUR, LABSPEC, PHURINE, GLUCOSEU, HGBUR, BILIRUBINUR, KETONESUR, PROTEINUR, UROBILINOGEN, NITRITE, LEUKOCYTESUR   STUDIES: No results found.   ELIGIBLE FOR AVAILABLE RESEARCH PROTOCOL:no  ASSESSMENT: 86 y.o. [place] woman status post left breast biopsy 02/23/2019 for a clinical T1c N0, stage IA invasive ductal carcinoma, with extracellular mucin, grade 2, estrogen and progesterone receptor positive, HER-2 not amplified, with an MIB-1 of 5%  (1) anastrozole started 03/16/2019   (2) breast conserving surgery if and when disease progression documented  (3) consider adjuvant radiation  (4) anemia of renal insufficiency  (a) to start Aranesp or bio similar 05/27/2019, repeated every 21 days   PLAN: Sundy is tolerating anastrozole remarkably well.  She would prefer to avoid surgery if at all possible.  She understands the only way we know of curing breast cancer does include surgery.  We also discussed the fact that in my experience patients initially do respond to antiestrogens but after a period of time the tumor will progress.  If and when she has surgery at that time she will be older than now and perhaps not in as good shape.  On the other hand she could have surgery under nerve block and local anesthesia quite safely.  After much discussion she is most interested in continuing on anastrozole and following the cancer in her breast.  She will have a left breast ultrasound mid June and see me shortly thereafter.  Quite aside from this her hemoglobin has been steadily dropping.  At this point she is symptomatic, not only fatigued but short of breath with minimal  exertion.  We discussed her chronic kidney failure which is currently stage IV.  That is doubtless the reason for her anemia.  She is agreeable to eat both supplementation.  I have written for this to start 05/27/2019 since her granddaughter can drive her here on Friday afternoons.  We will repeat this every 21 days and reassess when she returns to see me for breast cancer follow-up in June.  She understands the goal is not to normalize the hemoglobin but to bring it to between 10 and 11, at which level she should not have symptoms related to the hemoglobin.  She understands if the hemoglobin is raised much beyond 12 there is a possibility of increasing death which is the reason we do not do that.  She has a good understanding of this plan and agrees with it.  I am adding basic anemia labs to her first EPO dose on 04/09 and we will also check her iron stores and reticulocyte count serially.    She knows to call for any other issue that may develop before her return visit here.  Total encounter time 40 minutes.Chauncey Cruel, MD   05/17/2019 6:03 PM Medical Oncology and Hematology [facility] [address], [place] [address] Tel. [phone]    Fax. [phone]   This document serves as a record of services personally performed by Lurline Del, MD. It was created on his behalf by Wilburn Mylar, a trained medical scribe. The creation of this record is based on the scribe's personal observations and the provider's statements to them.   I, Lurline Del MD, have reviewed the above documentation for accuracy and completeness, and I agree with the above.   *  Total Encounter Time as defined by the Centers for Medicare and Medicaid Services includes, in addition to the face-to-face time of a patient visit (documented in the note above) non-face-to-face time: obtaining and reviewing outside history, ordering and reviewing medications, tests or procedures, care  coordination (communications with other health care professionals or caregivers) and documentation in the medical record.

## 2019-05-17 ENCOUNTER — Inpatient Hospital Stay: Payer: Medicare Other

## 2019-05-17 ENCOUNTER — Inpatient Hospital Stay: Payer: Medicare Other | Attending: Oncology | Admitting: Oncology

## 2019-05-17 ENCOUNTER — Other Ambulatory Visit: Payer: Self-pay

## 2019-05-17 VITALS — BP 132/95 | HR 76 | Temp 98.6°F | Resp 18 | Ht 64.0 in | Wt 175.4 lb

## 2019-05-17 DIAGNOSIS — Z87891 Personal history of nicotine dependence: Secondary | ICD-10-CM | POA: Insufficient documentation

## 2019-05-17 DIAGNOSIS — E1159 Type 2 diabetes mellitus with other circulatory complications: Secondary | ICD-10-CM

## 2019-05-17 DIAGNOSIS — Z170 Estrogen receptor positive status [ER+]: Secondary | ICD-10-CM

## 2019-05-17 DIAGNOSIS — Z79899 Other long term (current) drug therapy: Secondary | ICD-10-CM | POA: Diagnosis not present

## 2019-05-17 DIAGNOSIS — Z79811 Long term (current) use of aromatase inhibitors: Secondary | ICD-10-CM | POA: Insufficient documentation

## 2019-05-17 DIAGNOSIS — C50412 Malignant neoplasm of upper-outer quadrant of left female breast: Secondary | ICD-10-CM | POA: Diagnosis present

## 2019-05-17 DIAGNOSIS — E1122 Type 2 diabetes mellitus with diabetic chronic kidney disease: Secondary | ICD-10-CM | POA: Diagnosis not present

## 2019-05-17 DIAGNOSIS — N184 Chronic kidney disease, stage 4 (severe): Secondary | ICD-10-CM

## 2019-05-17 DIAGNOSIS — I129 Hypertensive chronic kidney disease with stage 1 through stage 4 chronic kidney disease, or unspecified chronic kidney disease: Secondary | ICD-10-CM | POA: Insufficient documentation

## 2019-05-17 DIAGNOSIS — I350 Nonrheumatic aortic (valve) stenosis: Secondary | ICD-10-CM

## 2019-05-17 DIAGNOSIS — E038 Other specified hypothyroidism: Secondary | ICD-10-CM | POA: Diagnosis not present

## 2019-05-17 DIAGNOSIS — E1169 Type 2 diabetes mellitus with other specified complication: Secondary | ICD-10-CM

## 2019-05-17 DIAGNOSIS — N189 Chronic kidney disease, unspecified: Secondary | ICD-10-CM | POA: Insufficient documentation

## 2019-05-17 DIAGNOSIS — D631 Anemia in chronic kidney disease: Secondary | ICD-10-CM

## 2019-05-17 DIAGNOSIS — N179 Acute kidney failure, unspecified: Secondary | ICD-10-CM

## 2019-05-17 DIAGNOSIS — E039 Hypothyroidism, unspecified: Secondary | ICD-10-CM | POA: Diagnosis not present

## 2019-05-17 DIAGNOSIS — Z7901 Long term (current) use of anticoagulants: Secondary | ICD-10-CM | POA: Insufficient documentation

## 2019-05-17 DIAGNOSIS — I252 Old myocardial infarction: Secondary | ICD-10-CM | POA: Diagnosis not present

## 2019-05-17 DIAGNOSIS — E785 Hyperlipidemia, unspecified: Secondary | ICD-10-CM

## 2019-05-17 DIAGNOSIS — I2510 Atherosclerotic heart disease of native coronary artery without angina pectoris: Secondary | ICD-10-CM | POA: Diagnosis not present

## 2019-05-17 HISTORY — DX: Chronic kidney disease, stage 4 (severe): N18.4

## 2019-05-17 HISTORY — DX: Acute kidney failure, unspecified: N18.9

## 2019-05-17 HISTORY — DX: Anemia in chronic kidney disease: D63.1

## 2019-05-17 HISTORY — DX: Acute kidney failure, unspecified: N17.9

## 2019-05-17 LAB — CBC WITH DIFFERENTIAL (CANCER CENTER ONLY)
Abs Immature Granulocytes: 0.02 10*3/uL (ref 0.00–0.07)
Basophils Absolute: 0 10*3/uL (ref 0.0–0.1)
Basophils Relative: 1 %
Eosinophils Absolute: 0.2 10*3/uL (ref 0.0–0.5)
Eosinophils Relative: 2 %
HCT: 30.8 % — ABNORMAL LOW (ref 36.0–46.0)
Hemoglobin: 9.4 g/dL — ABNORMAL LOW (ref 12.0–15.0)
Immature Granulocytes: 0 %
Lymphocytes Relative: 21 %
Lymphs Abs: 1.4 10*3/uL (ref 0.7–4.0)
MCH: 29.7 pg (ref 26.0–34.0)
MCHC: 30.5 g/dL (ref 30.0–36.0)
MCV: 97.5 fL (ref 80.0–100.0)
Monocytes Absolute: 0.6 10*3/uL (ref 0.1–1.0)
Monocytes Relative: 10 %
Neutro Abs: 4.3 10*3/uL (ref 1.7–7.7)
Neutrophils Relative %: 66 %
Platelet Count: 269 10*3/uL (ref 150–400)
RBC: 3.16 MIL/uL — ABNORMAL LOW (ref 3.87–5.11)
RDW: 14.3 % (ref 11.5–15.5)
WBC Count: 6.6 10*3/uL (ref 4.0–10.5)
nRBC: 0 % (ref 0.0–0.2)

## 2019-05-17 LAB — CMP (CANCER CENTER ONLY)
ALT: 14 U/L (ref 0–44)
AST: 22 U/L (ref 15–41)
Albumin: 3.3 g/dL — ABNORMAL LOW (ref 3.5–5.0)
Alkaline Phosphatase: 23 U/L — ABNORMAL LOW (ref 38–126)
Anion gap: 4 — ABNORMAL LOW (ref 5–15)
BUN: 43 mg/dL — ABNORMAL HIGH (ref 8–23)
CO2: 30 mmol/L (ref 22–32)
Calcium: 8.8 mg/dL — ABNORMAL LOW (ref 8.9–10.3)
Chloride: 104 mmol/L (ref 98–111)
Creatinine: 2.47 mg/dL — ABNORMAL HIGH (ref 0.44–1.00)
GFR, Est AFR Am: 20 mL/min — ABNORMAL LOW (ref >60)
GFR, Estimated: 17 mL/min — ABNORMAL LOW (ref >60)
Glucose, Bld: 178 mg/dL — ABNORMAL HIGH (ref 70–99)
Potassium: 4.2 mmol/L (ref 3.5–5.1)
Sodium: 138 mmol/L (ref 135–145)
Total Bilirubin: 0.5 mg/dL (ref 0.3–1.2)
Total Protein: 5.9 g/dL — ABNORMAL LOW (ref 6.5–8.1)

## 2019-05-18 ENCOUNTER — Telehealth: Payer: Self-pay | Admitting: Oncology

## 2019-05-18 ENCOUNTER — Encounter: Payer: Self-pay | Admitting: *Deleted

## 2019-05-18 NOTE — Telephone Encounter (Signed)
Scheduled appts per 3/30 los. Left voicemail with next appt date and time.

## 2019-05-24 ENCOUNTER — Encounter (HOSPITAL_COMMUNITY): Payer: Self-pay

## 2019-05-24 ENCOUNTER — Emergency Department (HOSPITAL_COMMUNITY): Payer: Medicare Other

## 2019-05-24 ENCOUNTER — Other Ambulatory Visit: Payer: Self-pay

## 2019-05-24 ENCOUNTER — Inpatient Hospital Stay (HOSPITAL_COMMUNITY)
Admission: EM | Admit: 2019-05-24 | Discharge: 2019-05-31 | DRG: 291 | Disposition: A | Discharge status: Home / Self Care | Payer: Medicare Other | Attending: Internal Medicine | Admitting: Internal Medicine

## 2019-05-24 DIAGNOSIS — R778 Other specified abnormalities of plasma proteins: Secondary | ICD-10-CM

## 2019-05-24 DIAGNOSIS — I130 Hypertensive heart and chronic kidney disease with heart failure and stage 1 through stage 4 chronic kidney disease, or unspecified chronic kidney disease: Principal | ICD-10-CM | POA: Diagnosis present

## 2019-05-24 DIAGNOSIS — J90 Pleural effusion, not elsewhere classified: Secondary | ICD-10-CM

## 2019-05-24 DIAGNOSIS — R7989 Other specified abnormal findings of blood chemistry: Secondary | ICD-10-CM

## 2019-05-24 DIAGNOSIS — I340 Nonrheumatic mitral (valve) insufficiency: Secondary | ICD-10-CM | POA: Diagnosis present

## 2019-05-24 DIAGNOSIS — E1122 Type 2 diabetes mellitus with diabetic chronic kidney disease: Secondary | ICD-10-CM | POA: Diagnosis present

## 2019-05-24 DIAGNOSIS — Z8249 Family history of ischemic heart disease and other diseases of the circulatory system: Secondary | ICD-10-CM

## 2019-05-24 DIAGNOSIS — I5043 Acute on chronic combined systolic (congestive) and diastolic (congestive) heart failure: Secondary | ICD-10-CM | POA: Diagnosis present

## 2019-05-24 DIAGNOSIS — Z79811 Long term (current) use of aromatase inhibitors: Secondary | ICD-10-CM

## 2019-05-24 DIAGNOSIS — Z79890 Hormone replacement therapy (postmenopausal): Secondary | ICD-10-CM

## 2019-05-24 DIAGNOSIS — I509 Heart failure, unspecified: Secondary | ICD-10-CM

## 2019-05-24 DIAGNOSIS — Z818 Family history of other mental and behavioral disorders: Secondary | ICD-10-CM

## 2019-05-24 DIAGNOSIS — E039 Hypothyroidism, unspecified: Secondary | ICD-10-CM | POA: Diagnosis present

## 2019-05-24 DIAGNOSIS — Z9889 Other specified postprocedural states: Secondary | ICD-10-CM

## 2019-05-24 DIAGNOSIS — I495 Sick sinus syndrome: Secondary | ICD-10-CM | POA: Diagnosis present

## 2019-05-24 DIAGNOSIS — I5031 Acute diastolic (congestive) heart failure: Secondary | ICD-10-CM

## 2019-05-24 DIAGNOSIS — N189 Chronic kidney disease, unspecified: Secondary | ICD-10-CM | POA: Diagnosis present

## 2019-05-24 DIAGNOSIS — E876 Hypokalemia: Secondary | ICD-10-CM | POA: Diagnosis present

## 2019-05-24 DIAGNOSIS — J918 Pleural effusion in other conditions classified elsewhere: Secondary | ICD-10-CM | POA: Diagnosis present

## 2019-05-24 DIAGNOSIS — R9431 Abnormal electrocardiogram [ECG] [EKG]: Secondary | ICD-10-CM | POA: Diagnosis present

## 2019-05-24 DIAGNOSIS — I252 Old myocardial infarction: Secondary | ICD-10-CM

## 2019-05-24 DIAGNOSIS — D631 Anemia in chronic kidney disease: Secondary | ICD-10-CM | POA: Diagnosis present

## 2019-05-24 DIAGNOSIS — N184 Chronic kidney disease, stage 4 (severe): Secondary | ICD-10-CM | POA: Diagnosis present

## 2019-05-24 DIAGNOSIS — C50412 Malignant neoplasm of upper-outer quadrant of left female breast: Secondary | ICD-10-CM

## 2019-05-24 DIAGNOSIS — Z87891 Personal history of nicotine dependence: Secondary | ICD-10-CM

## 2019-05-24 DIAGNOSIS — E785 Hyperlipidemia, unspecified: Secondary | ICD-10-CM | POA: Diagnosis present

## 2019-05-24 DIAGNOSIS — R600 Localized edema: Secondary | ICD-10-CM

## 2019-05-24 DIAGNOSIS — N179 Acute kidney failure, unspecified: Secondary | ICD-10-CM | POA: Diagnosis present

## 2019-05-24 DIAGNOSIS — Z7982 Long term (current) use of aspirin: Secondary | ICD-10-CM

## 2019-05-24 DIAGNOSIS — Z66 Do not resuscitate: Secondary | ICD-10-CM | POA: Diagnosis present

## 2019-05-24 DIAGNOSIS — Z8261 Family history of arthritis: Secondary | ICD-10-CM

## 2019-05-24 DIAGNOSIS — I480 Paroxysmal atrial fibrillation: Secondary | ICD-10-CM | POA: Diagnosis present

## 2019-05-24 DIAGNOSIS — I2510 Atherosclerotic heart disease of native coronary artery without angina pectoris: Secondary | ICD-10-CM | POA: Diagnosis present

## 2019-05-24 DIAGNOSIS — I214 Non-ST elevation (NSTEMI) myocardial infarction: Secondary | ICD-10-CM | POA: Diagnosis present

## 2019-05-24 DIAGNOSIS — R0602 Shortness of breath: Secondary | ICD-10-CM | POA: Diagnosis not present

## 2019-05-24 DIAGNOSIS — I10 Essential (primary) hypertension: Secondary | ICD-10-CM | POA: Diagnosis present

## 2019-05-24 DIAGNOSIS — Z20822 Contact with and (suspected) exposure to covid-19: Secondary | ICD-10-CM | POA: Diagnosis present

## 2019-05-24 DIAGNOSIS — R609 Edema, unspecified: Secondary | ICD-10-CM

## 2019-05-24 DIAGNOSIS — Z170 Estrogen receptor positive status [ER+]: Secondary | ICD-10-CM

## 2019-05-24 DIAGNOSIS — Z79899 Other long term (current) drug therapy: Secondary | ICD-10-CM

## 2019-05-24 DIAGNOSIS — I080 Rheumatic disorders of both mitral and aortic valves: Secondary | ICD-10-CM | POA: Diagnosis present

## 2019-05-24 DIAGNOSIS — Z7901 Long term (current) use of anticoagulants: Secondary | ICD-10-CM

## 2019-05-24 HISTORY — DX: Heart disease, unspecified: I51.9

## 2019-05-24 HISTORY — DX: Paroxysmal atrial fibrillation: I48.0

## 2019-05-24 LAB — CBC
HCT: 31.1 % — ABNORMAL LOW (ref 36.0–46.0)
Hemoglobin: 9.5 g/dL — ABNORMAL LOW (ref 12.0–15.0)
MCH: 30.1 pg (ref 26.0–34.0)
MCHC: 30.5 g/dL (ref 30.0–36.0)
MCV: 98.4 fL (ref 80.0–100.0)
Platelets: 267 10*3/uL (ref 150–400)
RBC: 3.16 MIL/uL — ABNORMAL LOW (ref 3.87–5.11)
RDW: 14 % (ref 11.5–15.5)
WBC: 7.2 10*3/uL (ref 4.0–10.5)
nRBC: 0 % (ref 0.0–0.2)

## 2019-05-24 LAB — BASIC METABOLIC PANEL
Anion gap: 6 (ref 5–15)
BUN: 53 mg/dL — ABNORMAL HIGH (ref 8–23)
CO2: 26 mmol/L (ref 22–32)
Calcium: 8.7 mg/dL — ABNORMAL LOW (ref 8.9–10.3)
Chloride: 106 mmol/L (ref 98–111)
Creatinine, Ser: 2.08 mg/dL — ABNORMAL HIGH (ref 0.44–1.00)
GFR calc Af Amer: 24 mL/min — ABNORMAL LOW (ref >60)
GFR calc non Af Amer: 21 mL/min — ABNORMAL LOW (ref >60)
Glucose, Bld: 171 mg/dL — ABNORMAL HIGH (ref 70–99)
Potassium: 4 mmol/L (ref 3.5–5.1)
Sodium: 138 mmol/L (ref 135–145)

## 2019-05-24 LAB — TROPONIN I (HIGH SENSITIVITY): Troponin I (High Sensitivity): 764 ng/L (ref <18)

## 2019-05-24 MED ORDER — SODIUM CHLORIDE 0.9% FLUSH
3.0000 mL | Freq: Once | INTRAVENOUS | Status: AC
  Administered 2019-05-25: 02:00:00 3 mL via INTRAVENOUS

## 2019-05-24 NOTE — ED Notes (Signed)
Date and time results received: 05/24/19  (use smartphrase ".now" to insert current time)  Test: Trop Critical Value: 764 Name of Provider Notified:

## 2019-05-24 NOTE — ED Triage Notes (Signed)
Pt arrives to ED w/ c/o SOB x 1 day. Pt denies chest pain.

## 2019-05-25 ENCOUNTER — Inpatient Hospital Stay (HOSPITAL_COMMUNITY): Payer: Medicare Other

## 2019-05-25 ENCOUNTER — Encounter (HOSPITAL_COMMUNITY): Payer: Self-pay | Admitting: Internal Medicine

## 2019-05-25 DIAGNOSIS — I480 Paroxysmal atrial fibrillation: Secondary | ICD-10-CM | POA: Diagnosis present

## 2019-05-25 DIAGNOSIS — I5031 Acute diastolic (congestive) heart failure: Secondary | ICD-10-CM

## 2019-05-25 DIAGNOSIS — J918 Pleural effusion in other conditions classified elsewhere: Secondary | ICD-10-CM | POA: Diagnosis present

## 2019-05-25 DIAGNOSIS — E039 Hypothyroidism, unspecified: Secondary | ICD-10-CM | POA: Diagnosis present

## 2019-05-25 DIAGNOSIS — I2510 Atherosclerotic heart disease of native coronary artery without angina pectoris: Secondary | ICD-10-CM | POA: Diagnosis present

## 2019-05-25 DIAGNOSIS — Z8249 Family history of ischemic heart disease and other diseases of the circulatory system: Secondary | ICD-10-CM | POA: Diagnosis not present

## 2019-05-25 DIAGNOSIS — R778 Other specified abnormalities of plasma proteins: Secondary | ICD-10-CM | POA: Diagnosis not present

## 2019-05-25 DIAGNOSIS — I080 Rheumatic disorders of both mitral and aortic valves: Secondary | ICD-10-CM | POA: Diagnosis present

## 2019-05-25 DIAGNOSIS — R0602 Shortness of breath: Secondary | ICD-10-CM | POA: Diagnosis present

## 2019-05-25 DIAGNOSIS — Z79890 Hormone replacement therapy (postmenopausal): Secondary | ICD-10-CM | POA: Diagnosis not present

## 2019-05-25 DIAGNOSIS — I25119 Atherosclerotic heart disease of native coronary artery with unspecified angina pectoris: Secondary | ICD-10-CM | POA: Diagnosis not present

## 2019-05-25 DIAGNOSIS — I214 Non-ST elevation (NSTEMI) myocardial infarction: Secondary | ICD-10-CM

## 2019-05-25 DIAGNOSIS — I5033 Acute on chronic diastolic (congestive) heart failure: Secondary | ICD-10-CM | POA: Diagnosis not present

## 2019-05-25 DIAGNOSIS — Z8261 Family history of arthritis: Secondary | ICD-10-CM | POA: Diagnosis not present

## 2019-05-25 DIAGNOSIS — I252 Old myocardial infarction: Secondary | ICD-10-CM | POA: Diagnosis not present

## 2019-05-25 DIAGNOSIS — N184 Chronic kidney disease, stage 4 (severe): Secondary | ICD-10-CM | POA: Diagnosis present

## 2019-05-25 DIAGNOSIS — D631 Anemia in chronic kidney disease: Secondary | ICD-10-CM | POA: Diagnosis present

## 2019-05-25 DIAGNOSIS — E785 Hyperlipidemia, unspecified: Secondary | ICD-10-CM | POA: Diagnosis present

## 2019-05-25 DIAGNOSIS — I495 Sick sinus syndrome: Secondary | ICD-10-CM | POA: Diagnosis not present

## 2019-05-25 DIAGNOSIS — Z818 Family history of other mental and behavioral disorders: Secondary | ICD-10-CM | POA: Diagnosis not present

## 2019-05-25 DIAGNOSIS — Z170 Estrogen receptor positive status [ER+]: Secondary | ICD-10-CM | POA: Diagnosis not present

## 2019-05-25 DIAGNOSIS — I5041 Acute combined systolic (congestive) and diastolic (congestive) heart failure: Secondary | ICD-10-CM | POA: Diagnosis not present

## 2019-05-25 DIAGNOSIS — Z87891 Personal history of nicotine dependence: Secondary | ICD-10-CM | POA: Diagnosis not present

## 2019-05-25 DIAGNOSIS — I5043 Acute on chronic combined systolic (congestive) and diastolic (congestive) heart failure: Secondary | ICD-10-CM | POA: Diagnosis present

## 2019-05-25 DIAGNOSIS — I509 Heart failure, unspecified: Secondary | ICD-10-CM | POA: Insufficient documentation

## 2019-05-25 DIAGNOSIS — Z7901 Long term (current) use of anticoagulants: Secondary | ICD-10-CM | POA: Diagnosis not present

## 2019-05-25 DIAGNOSIS — R9431 Abnormal electrocardiogram [ECG] [EKG]: Secondary | ICD-10-CM | POA: Diagnosis present

## 2019-05-25 DIAGNOSIS — E876 Hypokalemia: Secondary | ICD-10-CM | POA: Diagnosis present

## 2019-05-25 DIAGNOSIS — I10 Essential (primary) hypertension: Secondary | ICD-10-CM | POA: Diagnosis not present

## 2019-05-25 DIAGNOSIS — Z20822 Contact with and (suspected) exposure to covid-19: Secondary | ICD-10-CM | POA: Diagnosis present

## 2019-05-25 DIAGNOSIS — C50412 Malignant neoplasm of upper-outer quadrant of left female breast: Secondary | ICD-10-CM | POA: Diagnosis present

## 2019-05-25 DIAGNOSIS — Z66 Do not resuscitate: Secondary | ICD-10-CM | POA: Diagnosis present

## 2019-05-25 DIAGNOSIS — Z7982 Long term (current) use of aspirin: Secondary | ICD-10-CM | POA: Diagnosis not present

## 2019-05-25 DIAGNOSIS — I5021 Acute systolic (congestive) heart failure: Secondary | ICD-10-CM | POA: Diagnosis not present

## 2019-05-25 DIAGNOSIS — I130 Hypertensive heart and chronic kidney disease with heart failure and stage 1 through stage 4 chronic kidney disease, or unspecified chronic kidney disease: Secondary | ICD-10-CM | POA: Diagnosis present

## 2019-05-25 HISTORY — DX: Acute diastolic (congestive) heart failure: I50.31

## 2019-05-25 HISTORY — DX: Heart failure, unspecified: I50.9

## 2019-05-25 LAB — CBC WITH DIFFERENTIAL/PLATELET
Abs Immature Granulocytes: 0.02 10*3/uL (ref 0.00–0.07)
Basophils Absolute: 0 10*3/uL (ref 0.0–0.1)
Basophils Relative: 1 %
Eosinophils Absolute: 0.2 10*3/uL (ref 0.0–0.5)
Eosinophils Relative: 2 %
HCT: 30 % — ABNORMAL LOW (ref 36.0–46.0)
Hemoglobin: 9.4 g/dL — ABNORMAL LOW (ref 12.0–15.0)
Immature Granulocytes: 0 %
Lymphocytes Relative: 16 %
Lymphs Abs: 1.2 10*3/uL (ref 0.7–4.0)
MCH: 30.7 pg (ref 26.0–34.0)
MCHC: 31.3 g/dL (ref 30.0–36.0)
MCV: 98 fL (ref 80.0–100.0)
Monocytes Absolute: 0.7 10*3/uL (ref 0.1–1.0)
Monocytes Relative: 9 %
Neutro Abs: 5.4 10*3/uL (ref 1.7–7.7)
Neutrophils Relative %: 72 %
Platelets: 233 10*3/uL (ref 150–400)
RBC: 3.06 MIL/uL — ABNORMAL LOW (ref 3.87–5.11)
RDW: 14.1 % (ref 11.5–15.5)
WBC: 7.5 10*3/uL (ref 4.0–10.5)
nRBC: 0 % (ref 0.0–0.2)

## 2019-05-25 LAB — ECHOCARDIOGRAM COMPLETE
Height: 64 in
Weight: 2792 oz

## 2019-05-25 LAB — COMPREHENSIVE METABOLIC PANEL
ALT: 14 U/L (ref 0–44)
AST: 27 U/L (ref 15–41)
Albumin: 3.1 g/dL — ABNORMAL LOW (ref 3.5–5.0)
Alkaline Phosphatase: 18 U/L — ABNORMAL LOW (ref 38–126)
Anion gap: 12 (ref 5–15)
BUN: 51 mg/dL — ABNORMAL HIGH (ref 8–23)
CO2: 26 mmol/L (ref 22–32)
Calcium: 8.7 mg/dL — ABNORMAL LOW (ref 8.9–10.3)
Chloride: 103 mmol/L (ref 98–111)
Creatinine, Ser: 2.04 mg/dL — ABNORMAL HIGH (ref 0.44–1.00)
GFR calc Af Amer: 25 mL/min — ABNORMAL LOW (ref >60)
GFR calc non Af Amer: 21 mL/min — ABNORMAL LOW (ref >60)
Glucose, Bld: 130 mg/dL — ABNORMAL HIGH (ref 70–99)
Potassium: 4 mmol/L (ref 3.5–5.1)
Sodium: 141 mmol/L (ref 135–145)
Total Bilirubin: 0.5 mg/dL (ref 0.3–1.2)
Total Protein: 5.5 g/dL — ABNORMAL LOW (ref 6.5–8.1)

## 2019-05-25 LAB — TSH: TSH: 1.13 u[IU]/mL (ref 0.350–4.500)

## 2019-05-25 LAB — TROPONIN I (HIGH SENSITIVITY)
Troponin I (High Sensitivity): 858 ng/L (ref <18)
Troponin I (High Sensitivity): 702 ng/L (ref <18)
Troponin I (High Sensitivity): 637 ng/L (ref <18)
Troponin I (High Sensitivity): 576 ng/L (ref <18)

## 2019-05-25 LAB — CBG MONITORING, ED
Glucose-Capillary: 121 mg/dL — ABNORMAL HIGH (ref 70–99)
Glucose-Capillary: 142 mg/dL — ABNORMAL HIGH (ref 70–99)

## 2019-05-25 LAB — GLUCOSE, CAPILLARY
Glucose-Capillary: 108 mg/dL — ABNORMAL HIGH (ref 70–99)
Glucose-Capillary: 158 mg/dL — ABNORMAL HIGH (ref 70–99)

## 2019-05-25 LAB — SARS CORONAVIRUS 2 (TAT 6-24 HRS): SARS Coronavirus 2: NEGATIVE

## 2019-05-25 LAB — BRAIN NATRIURETIC PEPTIDE: B Natriuretic Peptide: 2812.1 pg/mL — ABNORMAL HIGH (ref 0.0–100.0)

## 2019-05-25 MED ORDER — FUROSEMIDE 10 MG/ML IJ SOLN
20.0000 mg | Freq: Two times a day (BID) | INTRAMUSCULAR | Status: DC
  Administered 2019-05-25 – 2019-05-26 (×3): 20 mg via INTRAVENOUS
  Filled 2019-05-25 (×3): qty 2

## 2019-05-25 MED ORDER — HYDRALAZINE HCL 50 MG PO TABS
50.0000 mg | ORAL_TABLET | Freq: Three times a day (TID) | ORAL | Status: DC
  Administered 2019-05-25 – 2019-05-28 (×10): 50 mg via ORAL
  Filled 2019-05-25 (×10): qty 1
  Filled 2019-05-25: qty 2

## 2019-05-25 MED ORDER — NITROGLYCERIN 2 % TD OINT
1.0000 [in_us] | TOPICAL_OINTMENT | Freq: Once | TRANSDERMAL | Status: AC
  Administered 2019-05-25: 1 [in_us] via TOPICAL
  Filled 2019-05-25: qty 1

## 2019-05-25 MED ORDER — ONDANSETRON HCL 4 MG PO TABS: 4.0000 mg | ORAL_TABLET | Freq: Four times a day (QID) | ORAL | Status: DC | PRN

## 2019-05-25 MED ORDER — ATORVASTATIN CALCIUM 40 MG PO TABS
40.0000 mg | ORAL_TABLET | Freq: Every day | ORAL | Status: DC
  Administered 2019-05-25 – 2019-05-30 (×6): 40 mg via ORAL
  Filled 2019-05-25 (×6): qty 1

## 2019-05-25 MED ORDER — ACETAMINOPHEN 650 MG RE SUPP: 650.0000 mg | Freq: Four times a day (QID) | RECTAL | Status: DC | PRN

## 2019-05-25 MED ORDER — CARVEDILOL 12.5 MG PO TABS
12.5000 mg | ORAL_TABLET | Freq: Two times a day (BID) | ORAL | Status: DC
  Administered 2019-05-25: 09:00:00 12.5 mg via ORAL
  Filled 2019-05-25: qty 1

## 2019-05-25 MED ORDER — ISOSORBIDE MONONITRATE ER 30 MG PO TB24
30.0000 mg | ORAL_TABLET | Freq: Every day | ORAL | Status: DC
  Administered 2019-05-25 – 2019-05-30 (×6): 30 mg via ORAL
  Filled 2019-05-25 (×6): qty 1

## 2019-05-25 MED ORDER — LEVOTHYROXINE SODIUM 75 MCG PO TABS
150.0000 ug | ORAL_TABLET | Freq: Every day | ORAL | Status: DC
  Administered 2019-05-25 – 2019-05-31 (×7): 150 ug via ORAL
  Filled 2019-05-25 (×6): qty 2

## 2019-05-25 MED ORDER — ASPIRIN EC 81 MG PO TBEC
81.0000 mg | DELAYED_RELEASE_TABLET | Freq: Every day | ORAL | Status: DC
  Administered 2019-05-25 – 2019-05-31 (×6): 81 mg via ORAL
  Filled 2019-05-25 (×6): qty 1

## 2019-05-25 MED ORDER — ANASTROZOLE 1 MG PO TABS
1.0000 mg | ORAL_TABLET | Freq: Every day | ORAL | Status: DC
  Administered 2019-05-25 – 2019-05-31 (×7): 1 mg via ORAL
  Filled 2019-05-25 (×8): qty 1

## 2019-05-25 MED ORDER — ACETAMINOPHEN 325 MG PO TABS
650.0000 mg | ORAL_TABLET | Freq: Four times a day (QID) | ORAL | Status: DC | PRN
  Administered 2019-05-29: 11:00:00 650 mg via ORAL
  Filled 2019-05-25: qty 2

## 2019-05-25 MED ORDER — APIXABAN 2.5 MG PO TABS
2.5000 mg | ORAL_TABLET | Freq: Two times a day (BID) | ORAL | Status: DC
  Administered 2019-05-25 – 2019-05-29 (×9): 2.5 mg via ORAL
  Filled 2019-05-25 (×10): qty 1

## 2019-05-25 MED ORDER — ASCORBIC ACID 500 MG PO TABS
500.0000 mg | ORAL_TABLET | Freq: Every day | ORAL | Status: DC
  Administered 2019-05-25 – 2019-05-31 (×7): 500 mg via ORAL
  Filled 2019-05-25 (×7): qty 1

## 2019-05-25 MED ORDER — NITROGLYCERIN 0.4 MG SL SUBL
0.4000 mg | SUBLINGUAL_TABLET | SUBLINGUAL | Status: DC | PRN
  Administered 2019-05-29 (×2): 0.4 mg via SUBLINGUAL
  Filled 2019-05-25 (×2): qty 1

## 2019-05-25 MED ORDER — FERROUS SULFATE 325 (65 FE) MG PO TABS
325.0000 mg | ORAL_TABLET | Freq: Every day | ORAL | Status: DC
  Administered 2019-05-25 – 2019-05-31 (×6): 325 mg via ORAL
  Filled 2019-05-25 (×6): qty 1

## 2019-05-25 MED ORDER — ONDANSETRON HCL 4 MG/2ML IJ SOLN: 4.0000 mg | Freq: Four times a day (QID) | INTRAMUSCULAR | Status: DC | PRN

## 2019-05-25 MED ORDER — FENOFIBRATE 160 MG PO TABS
160.0000 mg | ORAL_TABLET | Freq: Every day | ORAL | Status: DC
  Administered 2019-05-25 – 2019-05-29 (×5): 160 mg via ORAL
  Filled 2019-05-25 (×5): qty 1

## 2019-05-25 MED ORDER — BIOTIN 1 MG PO CAPS: 1.0000 mg | ORAL_CAPSULE | Freq: Every day | ORAL | Status: DC

## 2019-05-25 MED ORDER — ASPIRIN 81 MG PO CHEW
324.0000 mg | CHEWABLE_TABLET | Freq: Once | ORAL | Status: AC
  Administered 2019-05-25: 324 mg via ORAL
  Filled 2019-05-25: qty 4

## 2019-05-25 MED ORDER — INSULIN ASPART 100 UNIT/ML ~~LOC~~ SOLN
0.0000 [IU] | Freq: Three times a day (TID) | SUBCUTANEOUS | Status: DC
  Administered 2019-05-25 – 2019-05-26 (×3): 1 [IU] via SUBCUTANEOUS
  Administered 2019-05-26: 2 [IU] via SUBCUTANEOUS
  Administered 2019-05-26 – 2019-05-29 (×6): 1 [IU] via SUBCUTANEOUS

## 2019-05-25 MED ORDER — MAGNESIUM OXIDE 400 (241.3 MG) MG PO TABS
400.0000 mg | ORAL_TABLET | Freq: Every day | ORAL | Status: DC
  Administered 2019-05-25 – 2019-05-31 (×7): 400 mg via ORAL
  Filled 2019-05-25 (×7): qty 1

## 2019-05-25 MED ORDER — FUROSEMIDE 10 MG/ML IJ SOLN
40.0000 mg | Freq: Once | INTRAMUSCULAR | Status: AC
  Administered 2019-05-25: 40 mg via INTRAVENOUS
  Filled 2019-05-25: qty 4

## 2019-05-25 NOTE — Consult Note (Signed)
[facility] Consult Note   Primary Physician:   Midge Minium, MD           Primary Cardiologist:    Park Liter, MD  Reason for Consultation:  Shortness of breath  HPI:    Lauren Walker is an 86 year old female with a past medical history significant for breast cancer, HFpEF, remote CAD (cath 2016 - occluded vessel with collaterals, no intervention), paroxysmal atrial fibrillation, severe mitral regurgitation, mild AS, CKD, type 2 diabetes mellitus, hypertension, hyperlipidemia, and hypothyroidism who presents to the hospital with complaints of exertional shortness of breath.  She has also been noticing increasing swelling in both her legs.  She denies any fevers or chills.  She does not have any chest pain. She was recently started on Arimidex for her breast CA.  The labs were as follows: Potassium 4.0, glucose 171, creatinine 2.08, WBC 7.2, hemoglobin 9.5, hematocrit 31.1 and platelets 267.  The BNP was 2812.  The high-sensitivity troponins were 764 and 858.  The chest x-ray showed a moderate left-sided pleural effusion with suggested underlying atelectasis/infilterate.  The ECG showed normal sinus rhythm, ventricular rate 67 bpm, occasional PVCs, LVH and nonspecific T wave flattening.   Previous cardiac testing: Echocardiogram 02/16/2019 IMPRESSIONS  1. Left ventricular ejection fraction, by visual estimation, is 60 to  65%. is mildly increased left ventricular hypertrophy.  2. Left ventricular diastolic parameters are indeterminate.  3. Mildly dilated left ventricular internal cavity size.  4. Global right ventricle has normal systolic function.The right  ventricular size is normal. No increase in right ventricular wall  thickness.  5. Left atrial size was severely dilated.  6. Right atrial size was normal.  7. Mild mitral valve prolapse.  8. Severe mitral valve regurgitation. No evidence of mitral stenosis.  9. The tricuspid valve is normal in  structure.  10. The aortic valve is normal in structure. Aortic valve regurgitation is  not visualized. Mild aortic valve stenosis.  11. The pulmonic valve was normal in structure. Pulmonic valve  regurgitation is not visualized.  12. There is dilatation of the ascending aorta measuring 38 mm.  13. Mildly elevated pulmonary artery systolic pressure.  14. The inferior vena cava is normal in size with greater than 50%  respiratory variability, suggesting right atrial pressure of 3 mmHg.  15. Severe MR with jet directed posteriorly. Minimal prolaps of the middle  segment of the anterior leaflet of the mitral valve noted A2.    Home Medications Prior to Admission medications   Medication Sig Start Date End Date Taking? Authorizing Provider  acetaminophen (TYLENOL) 650 MG CR tablet Take 650 mg by mouth every 8 (eight) hours as needed for pain.    [provider]  anastrozole (ARIMIDEX) 1 MG tablet Take 1 tablet (1 mg total) by mouth daily. 03/18/19   Magrinat, Virgie Dad, MD  apixaban (ELIQUIS) 2.5 MG TABS tablet Take 1 tablet (2.5 mg total) by mouth 2 (two) times daily. 03/10/19   Loel Dubonnet, NP  aspirin EC 81 MG tablet Take 81 mg by mouth daily.    [provider]  atorvastatin (LIPITOR) 40 MG tablet Take 1 tablet (40 mg total) by mouth daily. 03/02/19   Midge Minium, MD  Azelastine-Fluticasone [phone] MCG/ACT SUSP Place 1 spray into the nose 2 (two) times daily as needed.    [provider]  b complex vitamins capsule Take 1 capsule by mouth daily.    [provider]  Biotin  1 MG CAPS Take by mouth.    [provider]  carvedilol (COREG) 12.5 MG tablet Take 1 tablet (12.5 mg total) by mouth 2 (two) times daily with a meal. 03/02/19   Midge Minium, MD  Cholecalciferol (VITAMIN D3) 50 MCG (2000 UT) TABS Take 1 capsule by mouth daily.    [provider]  fenofibrate 160 MG tablet Take 1 tablet (160 mg total) by mouth daily.  12/21/18   Midge Minium, MD  Ferrous Sulfate (IRON) 325 (65 Fe) MG TABS Take 1 tablet by mouth daily.    [provider]  fluticasone (FLONASE) 50 MCG/ACT nasal spray SPRAY 2 SPRAYS INTO EACH NOSTRIL EVERY DAY 03/04/19   Midge Minium, MD  furosemide (LASIX) 20 MG tablet TAKE 1 TABLET BY MOUTH EVERY DAY 03/01/19   Midge Minium, MD  hydrALAZINE (APRESOLINE) 50 MG tablet Take 1 tablet (50 mg total) by mouth 3 (three) times daily. 02/14/19   Park Liter, MD  isosorbide mononitrate (IMDUR) 30 MG 24 hr tablet Take 1 tablet (30 mg total) by mouth daily. 03/02/19   Midge Minium, MD  levothyroxine (SYNTHROID) 150 MCG tablet Take 1 tablet (150 mcg total) by mouth daily before breakfast. 03/02/19   Midge Minium, MD  magnesium oxide (MAG-OX) 400 MG tablet Take 400 mg by mouth daily.    [provider]  nitroGLYCERIN (NITROSTAT) 0.4 MG SL tablet Place 0.4 mg under the tongue every 5 (five) minutes as needed for chest pain.    [provider]  quinapril (ACCUPRIL) 20 MG tablet Take 20 mg by mouth daily.  12/23/18   [provider]  vitamin C (ASCORBIC ACID) 500 MG tablet Take 500 mg by mouth daily.    [provider]    Past Medical History: Past Medical History:  Diagnosis Date  . Arrhythmia   . Arthritis   . Chronic kidney disease   . Coronary artery disease   . Diabetes mellitus without complication ([place])   . Diverticulitis   . Hypertension   . Hypothyroidism   . Myocardial infarction ([place])   . Thyroid disease     Past Surgical History: Past Surgical History:  Procedure Laterality Date  . APPENDECTOMY  1936  . PARS PLANA VITRECTOMY Right 12/04/2018   Procedure: RETINAL DETACHMENT REPAIR PPV 25 GAUGE WITH ENDO LASER AIR/FLUID EXCHANGE SF6 GAS INJECTION;  Surgeon: Jalene Mullet, MD;  Location: [facility];  Service: Ophthalmology;  Laterality: Right;  . TONSILLECTOMY      Family History: Family History  Problem  Relation Age of Onset  . Arthritis Mother   . Heart disease Mother   . Hypertension Mother   . Arthritis Father   . Heart attack Father   . Heart disease Father   . Arthritis Daughter   . Arthritis Son   . Depression Maternal Aunt   . Hyperlipidemia Maternal Aunt   . Hypertension Maternal Aunt     Social History: Social History   Socioeconomic History  . Marital status: Married    Spouse name: Not on file  . Number of children: Not on file  . Years of education: Not on file  . Highest education level: Not on file  Occupational History  . Not on file  Tobacco Use  . Smoking status: Former Research scientist (life sciences)  . Smokeless tobacco: Never Used  Substance and Sexual Activity  . Alcohol use: Yes    Comment: Very rare  . Drug use: Never  .  Sexual activity: Not Currently  Other Topics Concern  . Not on file  Social History Narrative  . Not on file   Social Determinants of Health   Financial Resource Strain:   . Difficulty of Paying Living Expenses:   Food Insecurity:   . Worried About Charity fundraiser in the Last Year:   . Arboriculturist in the Last Year:   Transportation Needs:   . Film/video editor (Medical):   Marland Kitchen Lack of Transportation (Non-Medical):   Physical Activity:   . Days of Exercise per Week:   . Minutes of Exercise per Session:   Stress:   . Feeling of Stress :   Social Connections:   . Frequency of Communication with Friends and Family:   . Frequency of Social Gatherings with Friends and Family:   . Attends Religious Services:   . Active Member of Clubs or Organizations:   . Attends Archivist Meetings:   Marland Kitchen Marital Status:     Allergies:  Allergies  Allergen Reactions  . Tape Itching and Rash     Review of Systems: [y] = yes, [ ]  = no   . General: Weight gain [ ] ; Weight loss [ ] ; Anorexia [ ] ; Fatigue [ ] ; Fever [ ] ; Chills [ ] ; Weakness [ ]   . Cardiac: Chest pain/pressure [ ] ; Resting SOB [Y]; Exertional SOB [Y]; Orthopnea [ ] ;  Pedal Edema [Y]; Palpitations [ ] ; Syncope [ ] ; Presyncope [ ] ; Paroxysmal nocturnal dyspnea[ ]   . Pulmonary: Cough [ ] ; Wheezing[ ] ; Hemoptysis[ ] ; Sputum [ ] ; Snoring [ ]   . GI: Vomiting[ ] ; Dysphagia[ ] ; Melena[ ] ; Hematochezia [ ] ; Heartburn[ ] ; Abdominal pain [ ] ; Constipation [ ] ; Diarrhea [ ] ; BRBPR [ ]   . GU: Hematuria[ ] ; Dysuria [ ] ; Nocturia[ ]   . Vascular: Pain in legs with walking [ ] ; Pain in feet with lying flat [ ] ; Non-healing sores [ ] ; Stroke [ ] ; TIA [ ] ; Slurred speech [ ] ;  . Neuro: Headaches[ ] ; Vertigo[ ] ; Seizures[ ] ; Paresthesias[ ] ;Blurred vision [ ] ; Diplopia [ ] ; Vision changes [ ]   . Ortho/Skin: Arthritis [ ] ; Joint pain [ ] ; Muscle pain [ ] ; Joint swelling [ ] ; Back Pain [ ] ; Rash [ ]   . Psych: Depression[ ] ; Anxiety[ ]   . Heme: Bleeding problems [ ] ; Clotting disorders [ ] ; Anemia [ ]   . Endocrine: Diabetes [ ] ; Thyroid dysfunction[ ]      Objective:    Vital Signs:   Temp:  [97.9 F (36.6 C)] 97.9 F (36.6 C) (04/06 2034) Pulse Rate:  [64-73] 66 (04/07 0130) Resp:  [18-26] 26 (04/07 0130) BP: (171-200)/(65-113) 200/65 (04/07 0130) SpO2:  [90 %-99 %] 99 % (04/07 0130)    Weight change: There were no vitals filed for this visit.  Intake/Output:  No intake or output data in the 24 hours ending 05/25/19 0159    Physical Exam    General:  Well appearing. No resp difficulty HEENT: normal Neck: supple. JVP . Carotids 2+ bilat; no bruits. No lymphadenopathy or thyromegaly appreciated. Cor: PMI nondisplaced. Regular rate & rhythm. No rubs, gallops or murmurs. Lungs: Bilateral crackles Abdomen: soft, nontender, nondistended. No hepatosplenomegaly. No bruits or masses. Good bowel sounds. Extremities: 3+ Pitting edema of bilateral lower extremities Neuro: alert & orientedx3, cranial nerves grossly intact. moves all 4 extremities w/o difficulty.  Affect pleasant    EKG     The ECG (that I have reviewed personally) showed  normal sinus rhythm,  ventricular rate 67 bpm, occasional PVCs, LVH and nonspecific T wave flattening.  Labs   Basic Metabolic Panel: Recent Labs  Lab 05/24/19 2036  NA 138  K 4.0  CL 106  CO2 26  GLUCOSE 171*  BUN 53*  CREATININE 2.08*  CALCIUM 8.7*    Liver Function Tests: No results for input(s): AST, ALT, ALKPHOS, BILITOT, PROT, ALBUMIN in the last 168 hours. No results for input(s): LIPASE, AMYLASE in the last 168 hours. No results for input(s): AMMONIA in the last 168 hours.  CBC: Recent Labs  Lab 05/24/19 2036  WBC 7.2  HGB 9.5*  HCT 31.1*  MCV 98.4  PLT 267    Cardiac Enzymes: No results for input(s): CKTOTAL, CKMB, CKMBINDEX, TROPONINI in the last 168 hours.  BNP: BNP (last 3 results) No results for input(s): BNP in the last 8760 hours.  ProBNP (last 3 results) No results for input(s): PROBNP in the last 8760 hours.   CBG: No results for input(s): GLUCAP in the last 168 hours.  Coagulation Studies: No results for input(s): LABPROT, INR in the last 72 hours.   Imaging   DG Chest 2 View  Result Date: 05/24/2019 CLINICAL DATA:  Shortness of breath. EXAM: CHEST - 2 VIEW COMPARISON:  None. FINDINGS: Mild cardiomegaly is noted. Atherosclerosis of thoracic aorta is noted. No pneumothorax is noted. Right lung is clear. Moderate left pleural effusion is noted with probable underlying atelectasis or infiltrate. Bony thorax is unremarkable. IMPRESSION: Moderate left pleural effusion with probable underlying atelectasis or infiltrate. Aortic Atherosclerosis (ICD10-I70.0). Electronically Signed   By: Marijo Conception M.D.   On: 05/24/2019 20:55       Assessment/Plan   1. Shortness of breath / HFpEF Patient has had worsening symptoms of shortness of breath with increased leg swelling.  Her ECG does not show any ischemic changes.  The chest x-ray documents a left-sided pleural effusion.  The high-sensitivity troponin and BNP are elevated.  Clinically the patient appears to be  volume overloaded on exam.  -Daily weights -Strict I and Os -IV boluses of loop diuretics as needed -Maintain serum potassium >4.0 and magnesium >2.0 -Continue Hydralazine 50mg  TID -Continue Imdur 30mg  daily -Continue carvedilol 12.5 mg twice daily  2. Elevated troponin Likely due to demand ischemia from elevated LV filling pressures  -Serial ECGs -Continue ASA 81 mg daily -Continue atorvastatin 40 mg nightly -Carvedilol 12.5 mg BID -No IV heparin. Patient is on Eliquis  3. Paroxysmal atrial fibrillation  -Continue Eliquis   Meade Maw, MD  05/25/2019, 1:59 AM  Cardiology Overnight Team Please contact [facility] for night-coverage after hours (4p -7a ) and weekends on [email or web address]

## 2019-05-25 NOTE — ED Provider Notes (Signed)
[facility] EMERGENCY DEPARTMENT Provider Note   CSN: [id number] Arrival date & time: 05/24/19  2008     History Chief Complaint  Patient presents with  . Shortness of Breath    Lauren Walker is a 86 y.o. female.  Patient with history of DM, HTN, MI, CAD, PAF, mitral regurg, to ED with onset SOB earlier today that has been constant since it started. No chest pain, nausea, diaphoresis. The SOB is worse when walking or lying down. She reports this has happened episodically in the past but usually resolves quickly. She denies use of home oxygen, albuterol. She has a history of MI and reports today's symptoms are dissimilar to previous event. She has also recently been started on Arimidex for treatment of breast CA (02/2019, Magrinat). No fever, cough, vomiting, diarrhea.   The history is provided by the patient. No language interpreter was used.  Shortness of Breath Associated symptoms: no abdominal pain, no chest pain, no cough, no diaphoresis and no fever        Past Medical History:  Diagnosis Date  . Arrhythmia   . Arthritis   . Chronic kidney disease   . Coronary artery disease   . Diabetes mellitus without complication ([place])   . Diverticulitis   . Hypertension   . Hypothyroidism   . Myocardial infarction ([place])   . Thyroid disease     Patient Active Problem List   Diagnosis Date Noted  . CKD stage G4/A1, GFR 15-29 and albumin creatinine ratio <30 mg/g (H[facility]) 05/17/2019  . Anemia associated with stage 4 chronic renal failure ([place]) 05/17/2019  . Malignant neoplasm of upper-outer quadrant of left breast in female, estrogen receptor positive ([place]) 03/02/2019  . Aortic stenosis 02/14/2019  . Renal insufficiency 12/29/2018  . Mitral regurgitation moderate to severe based on echocardiogram in summer 2020 11/04/2018  . Paroxysmal atrial fibrillation ([place]) 10/11/2018  . Hyperlipidemia associated with type 2 diabetes mellitus ([place]) 08/16/2018  . Type 2  diabetes mellitus with other circulatory complications ([place]) 94/70/9628  . Hypothyroid 08/16/2018  . HTN (hypertension) 08/16/2018  . CAD (coronary artery disease) 08/16/2018  . History of MI (myocardial infarction) 08/16/2018    Past Surgical History:  Procedure Laterality Date  . APPENDECTOMY  1936  . PARS PLANA VITRECTOMY Right 12/04/2018   Procedure: RETINAL DETACHMENT REPAIR PPV 25 GAUGE WITH ENDO LASER AIR/FLUID EXCHANGE SF6 GAS INJECTION;  Surgeon: Jalene Mullet, MD;  Location: [facility];  Service: Ophthalmology;  Laterality: Right;  . TONSILLECTOMY       OB History   No obstetric history on file.     Family History  Problem Relation Age of Onset  . Arthritis Mother   . Heart disease Mother   . Hypertension Mother   . Arthritis Father   . Heart attack Father   . Heart disease Father   . Arthritis Daughter   . Arthritis Son   . Depression Maternal Aunt   . Hyperlipidemia Maternal Aunt   . Hypertension Maternal Aunt     Social History   Tobacco Use  . Smoking status: Former Research scientist (life sciences)  . Smokeless tobacco: Never Used  Substance Use Topics  . Alcohol use: Yes    Comment: Very rare  . Drug use: Never    Home Medications Prior to Admission medications   Medication Sig Start Date End Date Taking? Authorizing Provider  acetaminophen (TYLENOL) 650 MG CR tablet Take 650 mg by mouth every 8 (eight) hours as needed for pain.  [provider]  anastrozole (ARIMIDEX) 1 MG tablet Take 1 tablet (1 mg total) by mouth daily. 03/18/19   Magrinat, Virgie Dad, MD  apixaban (ELIQUIS) 2.5 MG TABS tablet Take 1 tablet (2.5 mg total) by mouth 2 (two) times daily. 03/10/19   Loel Dubonnet, NP  aspirin EC 81 MG tablet Take 81 mg by mouth daily.    [provider]  atorvastatin (LIPITOR) 40 MG tablet Take 1 tablet (40 mg total) by mouth daily. 03/02/19   Midge Minium, MD  Azelastine-Fluticasone [phone] MCG/ACT SUSP Place 1 spray into the nose 2 (two) times daily  as needed.    [provider]  b complex vitamins capsule Take 1 capsule by mouth daily.    [provider]  Biotin 1 MG CAPS Take by mouth.    [provider]  carvedilol (COREG) 12.5 MG tablet Take 1 tablet (12.5 mg total) by mouth 2 (two) times daily with a meal. 03/02/19   Midge Minium, MD  Cholecalciferol (VITAMIN D3) 50 MCG (2000 UT) TABS Take 1 capsule by mouth daily.    [provider]  fenofibrate 160 MG tablet Take 1 tablet (160 mg total) by mouth daily. 12/21/18   Midge Minium, MD  Ferrous Sulfate (IRON) 325 (65 Fe) MG TABS Take 1 tablet by mouth daily.    [provider]  fluticasone (FLONASE) 50 MCG/ACT nasal spray SPRAY 2 SPRAYS INTO EACH NOSTRIL EVERY DAY 03/04/19   Midge Minium, MD  furosemide (LASIX) 20 MG tablet TAKE 1 TABLET BY MOUTH EVERY DAY 03/01/19   Midge Minium, MD  hydrALAZINE (APRESOLINE) 50 MG tablet Take 1 tablet (50 mg total) by mouth 3 (three) times daily. 02/14/19   Park Liter, MD  isosorbide mononitrate (IMDUR) 30 MG 24 hr tablet Take 1 tablet (30 mg total) by mouth daily. 03/02/19   Midge Minium, MD  levothyroxine (SYNTHROID) 150 MCG tablet Take 1 tablet (150 mcg total) by mouth daily before breakfast. 03/02/19   Midge Minium, MD  magnesium oxide (MAG-OX) 400 MG tablet Take 400 mg by mouth daily.    [provider]  nitroGLYCERIN (NITROSTAT) 0.4 MG SL tablet Place 0.4 mg under the tongue every 5 (five) minutes as needed for chest pain.    [provider]  quinapril (ACCUPRIL) 20 MG tablet Take 20 mg by mouth daily.  12/23/18   [provider]  vitamin C (ASCORBIC ACID) 500 MG tablet Take 500 mg by mouth daily.    [provider]    Allergies    Tape  Review of Systems   Review of Systems  Constitutional: Negative for chills, diaphoresis and fever.  HENT: Negative.   Respiratory: Positive for shortness of breath. Negative for cough.     Cardiovascular: Positive for leg swelling. Negative for chest pain.  Gastrointestinal: Negative.  Negative for abdominal pain and nausea.  Musculoskeletal: Negative.  Negative for myalgias.  Skin: Negative.   Neurological: Negative.  Negative for weakness.    Physical Exam Updated Vital Signs BP (!) 200/65   Pulse 66   Temp 97.9 F (36.6 C) (Oral)   Resp (!) 26   SpO2 99%   Physical Exam Vitals and nursing note reviewed.  Constitutional:      General: She is not in acute distress.    Appearance: She is well-developed.  HENT:     Head: Normocephalic.  Cardiovascular:     Rate and Rhythm: Normal rate  and regular rhythm.  Pulmonary:     Effort: Pulmonary effort is normal. No tachypnea.     Breath sounds: Rales present. No wheezing.     Comments: Appears dyspneic. Difficult to complete sentences but in no respiratory distress. Overall decreased air movement. Chest:     Chest wall: No tenderness.  Abdominal:     General: Bowel sounds are normal.     Palpations: Abdomen is soft.     Tenderness: There is no abdominal tenderness. There is no guarding or rebound.  Musculoskeletal:        General: Normal range of motion.     Cervical back: Normal range of motion and neck supple.     Comments: Significant bilateral LE pitting edema with changes of venous stasis.   Skin:    General: Skin is warm and dry.     Findings: No rash.  Neurological:     Mental Status: She is alert and oriented to person, place, and time.     ED Results / Procedures / Treatments   Labs (all labs ordered are listed, but only abnormal results are displayed) Labs Reviewed  BASIC METABOLIC PANEL - Abnormal; Notable for the following components:      Result Value   Glucose, Bld 171 (*)    BUN 53 (*)    Creatinine, Ser 2.08 (*)    Calcium 8.7 (*)    GFR calc non Af Amer 21 (*)    GFR calc Af Amer 24 (*)    All other components within normal limits  CBC - Abnormal; Notable for the following  components:   RBC 3.16 (*)    Hemoglobin 9.5 (*)    HCT 31.1 (*)    All other components within normal limits  TROPONIN I (HIGH SENSITIVITY) - Abnormal; Notable for the following components:   Troponin I (High Sensitivity) 764 (*)    All other components within normal limits  TROPONIN I (HIGH SENSITIVITY) - Abnormal; Notable for the following components:   Troponin I (High Sensitivity) 858 (*)    All other components within normal limits  SARS CORONAVIRUS 2 (TAT 6-24 HRS)  BRAIN NATRIURETIC PEPTIDE    EKG EKG Interpretation  Date/Time:  Tuesday May 24 2019 20:32:21 EDT Ventricular Rate:  67 PR Interval:  138 QRS Duration: 110 QT Interval:  444 QTC Calculation: 469 R Axis:   13 Text Interpretation: Normal sinus rhythm Premature ventricular complexes Left ventricular hypertrophy with repolarization abnormality ( Cornell product ) Possible Lateral infarct , age undetermined Abnormal ECG No old tracing to compare Confirmed by Delora Fuel ([id number]) on 05/24/2019 11:33:21 PM   Radiology DG Chest 2 View  Result Date: 05/24/2019 CLINICAL DATA:  Shortness of breath. EXAM: CHEST - 2 VIEW COMPARISON:  None. FINDINGS: Mild cardiomegaly is noted. Atherosclerosis of thoracic aorta is noted. No pneumothorax is noted. Right lung is clear. Moderate left pleural effusion is noted with probable underlying atelectasis or infiltrate. Bony thorax is unremarkable. IMPRESSION: Moderate left pleural effusion with probable underlying atelectasis or infiltrate. Aortic Atherosclerosis (ICD10-I70.0). Electronically Signed   By: Marijo Conception M.D.   On: 05/24/2019 20:55    Procedures Procedures (including critical care time) CRITICAL CARE Performed by: Dewaine Oats   Total critical care time: 45 minutes  Critical care time was exclusive of separately billable procedures and treating other patients.  Critical care was necessary to treat or prevent imminent or life-threatening  deterioration.  Critical care was time spent personally by me on  the following activities: development of treatment plan with patient and/or surrogate as well as nursing, discussions with consultants, evaluation of patient's response to treatment, examination of patient, obtaining history from patient or surrogate, ordering and performing treatments and interventions, ordering and review of laboratory studies, ordering and review of radiographic studies, pulse oximetry and re-evaluation of patient's condition.  Medications Ordered in ED Medications  sodium chloride flush (NS) 0.9 % injection 3 mL (3 mLs Intravenous Given 05/25/19 0142)  furosemide (LASIX) injection 40 mg (40 mg Intravenous Given 05/25/19 0143)  aspirin chewable tablet 324 mg (324 mg Oral Given 05/25/19 0140)  nitroGLYCERIN (NITROGLYN) 2 % ointment 1 inch (1 inch Topical Given 05/25/19 0140)    ED Course  I have reviewed the triage vital signs and the nursing notes.  Pertinent labs & imaging results that were available during my care of the patient were reviewed by me and considered in my medical decision making (see chart for details).  Clinical Course as of May 24 144  Wed May 25, 2019  0040 Troponin I (High Sensitivity)(!!): 764 [SP]    Clinical Course User Index [SP] Alfredia Client, PA-C   MDM Rules/Calculators/A&P                      Patient to ED with SOB constant since earlier today. History of same.   Chart reviewed. Initial troponin 764 with uptrending delta of 882. All other resulted labs are c/w previous. EKG abnormal without available comparisons. Reviewed by Dr. Roxanne Mins and does not have evidence of ischemic changes. Cardiology consulted and feels the causes for elevated troponin are multifactorial. They will consult, advises admit to medicine.    The patient appears significantly fluid overloaded. O2 saturation 90% on room air, 99% on 2 L O2 via [place]. She is on 20 mg Lasix daily. IV 40 mg ordered. She is hypertensive  as well, reporting she has not taken her night time medications. Will place an inch of nitro paste, provide aspirin as well. Will continue to observe.   Hospitalist paged for admission.   Discussed with Dr. Hal Hope who accepts the patient to his service.   Final Clinical Impression(s) / ED Diagnoses Final diagnoses:  None   1. Elevated troponin 2. Dyspnea 3. Peripheral edema  Rx / DC Orders ED Discharge Orders    None       Dennie Bible 87/86/76 [id number]    Delora Fuel, MD 47/09/62 0730

## 2019-05-25 NOTE — ED Notes (Signed)
Lunch Tray Ordered @ 1053.  

## 2019-05-25 NOTE — Progress Notes (Signed)
PHARMACIST - PHYSICIAN ORDER COMMUNICATION  CONCERNING: P&T Medication Policy on Herbal Medications  DESCRIPTION:  This patient's order for:  Biotin  has been noted.  This product(s) is classified as an "herbal" or natural product. Due to a lack of definitive safety studies or FDA approval, nonstandard manufacturing practices, plus the potential risk of unknown drug-drug interactions while on inpatient medications, the Pharmacy and Therapeutics Committee does not permit the use of "herbal" or natural products of this type within [facility].   ACTION TAKEN: The pharmacy department is unable to verify this order at this time and your patient has been informed of this safety policy. Please reevaluate patient's clinical condition at discharge and address if the herbal or natural product(s) should be resumed at that time.  Sherlon Handing, PharmD, BCPS Please see amion for complete clinical pharmacist phone list 05/25/2019 5:37 AM

## 2019-05-25 NOTE — Progress Notes (Addendum)
Progress Note  Patient Name: Lauren Walker Date of Encounter: 05/25/2019  Primary Cardiologist: Jenne Campus, MD  Subjective   Reports excellent UOP thus far. Breathing slightly improved. Legs appear edematous which she reports looks better than yesterday. This all came about abruptly - on Monday she was babysitting her 66 month old great grandchild. Denies any chest pain. Reports this felt like a bandlike sensation around her chest restricting her breathing.  Inpatient Medications    Scheduled Meds: . anastrozole  1 mg Oral Daily  . apixaban  2.5 mg Oral BID  . vitamin C  500 mg Oral Daily  . aspirin EC  81 mg Oral Daily  . atorvastatin  40 mg Oral q1800  . fenofibrate  160 mg Oral Daily  . ferrous sulfate  325 mg Oral Daily  . furosemide  20 mg Intravenous Q12H  . hydrALAZINE  50 mg Oral TID  . insulin aspart  0-9 Units Subcutaneous TID WC  . isosorbide mononitrate  30 mg Oral Daily  . levothyroxine  150 mcg Oral QAC breakfast  . magnesium oxide  400 mg Oral Daily   Continuous Infusions:  PRN Meds: acetaminophen **OR** acetaminophen, nitroGLYCERIN, ondansetron **OR** ondansetron (ZOFRAN) IV   Vital Signs    Vitals:   05/25/19 1100 05/25/19 1115 05/25/19 1130 05/25/19 1150  BP: (!) 100/35   (!) 125/38  Pulse: (!) 42 (!) 44 (!) 43   Resp: 20 18 (!) 22   Temp:      TempSrc:      SpO2: 96% 93% 93%   Weight:      Height:        Intake/Output Summary (Last 24 hours) at 05/25/2019 1232 Last data filed at 05/25/2019 1030 Gross per 24 hour  Intake 120 ml  Output 400 ml  Net -280 ml   Last 3 Weights 05/25/2019 05/17/2019 04/07/2019  Weight (lbs) 175 lb 6.4 oz 175 lb 6.4 oz 174 lb  Weight (kg) 79.561 kg 79.561 kg 78.926 kg     Telemetry    NSR/SB with occasional PACs/PVCs, with brief sinus brady down to the upper 30s-low 40s around 11  - Personally Reviewed  ECG    10:44am - sinus bradycardia (P waves best seen in V1), one PVC, nonspecific STT changes -  Personally Reviewed  Physical Exam   GEN: No acute distress.  HEENT: Normocephalic, atraumatic, sclera non-icteric. Neck: No JVD or bruits. Cardiac: RRR 2/6 SEM at RUSB and also apex. No rubs or gallops.  Radials/DP/PT 1+ and equal bilaterally.  Respiratory: DIffusely diminished throughout, no wheezes, rales or rhonchi. Breathing is unlabored. GI: Soft, nontender, non-distended, BS +x 4. MS: kyphotic posture. Extremities: No clubbing or cyanosis. 2+ shiny bilateral pitting edema. Distal pedal pulses are 2+ and equal bilaterally. Neuro:  AAOx3. Follows commands. Psych:  Responds to questions appropriately with a normal affect.  Labs    High Sensitivity Troponin:   Recent Labs  Lab 05/24/19 2036 05/24/19 2231 05/25/19 0617 05/25/19 0918  TROPONINIHS 764* 858* 702* 637*      Cardiac EnzymesNo results for input(s): TROPONINI in the last 168 hours. No results for input(s): TROPIPOC in the last 168 hours.   Chemistry Recent Labs  Lab 05/24/19 2036 05/25/19 0617  NA 138 141  K 4.0 4.0  CL 106 103  CO2 26 26  GLUCOSE 171* 130*  BUN 53* 51*  CREATININE 2.08* 2.04*  CALCIUM 8.7* 8.7*  PROT  --  5.5*  ALBUMIN  --  3.1*  AST  --  27  ALT  --  14  ALKPHOS  --  18*  BILITOT  --  0.5  GFRNONAA 21* 21*  GFRAA 24* 25*  ANIONGAP 6 12     Hematology Recent Labs  Lab 05/24/19 2036 05/25/19 0617  WBC 7.2 7.5  RBC 3.16* 3.06*  HGB 9.5* 9.4*  HCT 31.1* 30.0*  MCV 98.4 98.0  MCH 30.1 30.7  MCHC 30.5 31.3  RDW 14.0 14.1  PLT 267 233    BNP Recent Labs  Lab 05/25/19 0135  BNP 2,812.1*     DDimer No results for input(s): DDIMER in the last 168 hours.   Radiology    DG Chest 2 View  Result Date: 05/24/2019 CLINICAL DATA:  Shortness of breath. EXAM: CHEST - 2 VIEW COMPARISON:  None. FINDINGS: Mild cardiomegaly is noted. Atherosclerosis of thoracic aorta is noted. No pneumothorax is noted. Right lung is clear. Moderate left pleural effusion is noted with probable  underlying atelectasis or infiltrate. Bony thorax is unremarkable. IMPRESSION: Moderate left pleural effusion with probable underlying atelectasis or infiltrate. Aortic Atherosclerosis (ICD10-I70.0). Electronically Signed   By: Marijo Conception M.D.   On: 05/24/2019 20:55    Cardiac Studies   1. No cath report available  2. 2D echo 02/16/19 1. Left ventricular ejection fraction, by visual estimation, is 60 to  65%. is mildly increased left ventricular hypertrophy.  2. Left ventricular diastolic parameters are indeterminate.  3. Mildly dilated left ventricular internal cavity size.  4. Global right ventricle has normal systolic function.The right  ventricular size is normal. No increase in right ventricular wall  thickness.  5. Left atrial size was severely dilated.  6. Right atrial size was normal.  7. Mild mitral valve prolapse.  8. Severe mitral valve regurgitation. No evidence of mitral stenosis.  9. The tricuspid valve is normal in structure.  10. The aortic valve is normal in structure. Aortic valve regurgitation is  not visualized. Mild aortic valve stenosis.  11. The pulmonic valve was normal in structure. Pulmonic valve  regurgitation is not visualized.  12. There is dilatation of the ascending aorta measuring 38 mm.  13. Mildly elevated pulmonary artery systolic pressure.  14. The inferior vena cava is normal in size with greater than 50%  respiratory variability, suggesting right atrial pressure of 3 mmHg.  15. Severe MR with jet directed posteriorly. Minimal prolaps of the middle  segment of the anterior leaflet of the mitral valve noted A2.   3. 2D echo pending this admission  Patient Profile     86 y.o. female with history of CAD (cath 2016 elsewhere, no report available, was told 1 occluded artery with collateral managed medically), paroxysmal atrial fibrillation, chronic diastolic CHF, severe mitral regurgitation, mild AS, CKD stage IV, anemia, type 2  diabetes mellitus, hypertension, hyperlipidemia, hypothyroidism, breast CA. She was seen by Dr. Agustin Cree via telemedicine in 03/2019 who discussed options for surgery for her mitral valve disease including open heart surgery vs MitraClip. She had recently been diagnosed with breast CA so wanted to postpone any intervention until breast cancer plan. Per oncology she has wished to defer surgery and utilize anastrazole instead. She has also recently been followed for anemia felt due to renal disease. She presented to [facility] with increasing SOB and edema. She was found to have elevated BNP, left sided pleural effusion and elevated troponins.  Assessment & Plan    1. Acute on chronic diastolic CHF with left pleural effusion -  appears volume overloaded on exam. She reports good UOP thus far. I/O's not accurate as she had issue with purewick leaking overnight. Would continue Lasix at present dosing and monitor renal function - will keep on conservative dose as BP allows given softer BP earlier today.  2. Severe mitral regurgitation with mild aortic stenosis - repeat echo planned, will discuss with MD.  3. Elevated troponin with known CAD by cath 2016 - initially felt due to demand ischemia from elevated LV filling pressures. Cannot exclude underlying ACS given degree of troponemia but trend is fairly flat. Patient is not ideal candidate for immediately pursuing invasive evaluation due to her renal insufficiency and anemia. She is on ASA, BB, statin and Imdur. Will review on context of the above with MD - for now she remains on Eliquis instead of heparin.  3. Paroxysmal atrial fibrillation, maintaining NSR but with sinus bradycardia in the upper 30s-low 40s this morning - she has received carvedilol this morning. She was asymptomatic with the bradycardia per her report. HR is presently in the 50s-60s with BP 125/38. Will hold further carvedilol dosing for now and trend.  4. Medical issues to include CKD stage IV,  anemia, breast cancer. Cr appears within baseline.  For questions or updates, please contact [place] Please consult [email or web address] for contact info under Cardiology/STEMI.  Signed, Charlie Pitter, PA-C 05/25/2019, 12:32 PM    Patient seen and examined with Melina Copa PA-C.  Agree as above, with the following exceptions and changes as noted below. Good response to lasix, feeling slightly better. Gen: NAD, CV: RRR, holosystolic murmur at apex and axilla, 2/6 SEM, Lungs: clear, Abd: soft, Extrem: Warm, well perfused, 2+ edema, Neuro/Psych: alert and oriented x 3, normal mood and affect. All available labs, radiology testing, previous records reviewed. Continue diuresis. Repeat echo ordered by primary service will be reviewed. Will consider TEE with structural team cardiologist next week after diuresis is optimized for evaluation of mitral valve regurgitation if needed for severity of MR and possibility of mtiral repair. Will discuss with patient what her wishes are in this regard.  [name]

## 2019-05-25 NOTE — ED Notes (Signed)
Adhikari MD made aware of pt. Heart rate in the 40s.

## 2019-05-25 NOTE — H&P (Signed)
History and Physical    Lauren Walker IRW:[id number] DOB: 02-17-1930 DOA: 05/24/2019  PCP: Midge Minium, MD  Patient coming from: Home.  Chief Complaint: Shortness of breath.  HPI: Lauren Walker is a 86 y.o. female with history of severe mitral regurgitation paroxysmal atrial fibrillation CAD diabetes mellitus chronic kidney disease stage IV hypothyroidism breast cancer anemia presents to the ER with complaint of shortness of breath.  Patient states she has been having increasing lower extremity edema over the last few weeks and became acutely short of breath yesterday.  Denies chest pain fever chills or productive cough.  Shortness of breath was present at rest increased on exertion.  ED Course: In the ER x-rays show moderate left pleural effusion with possible infiltrates.  High sensitive troponin was 760 4 repeat 1 was 858 BNP was 2800 hemoglobin 9.5 appears to be at baseline EKG shows normal sinus rhythm with PVCs Covid test was negative creatinine at baseline of 2.08.  Cardiology was consulted and patient was given Lasix 40 mg IV admitted for acute CHF with possible non-ST MI.  Review of Systems: As per HPI, rest all negative.   Past Medical History:  Diagnosis Date  . Arrhythmia   . Arthritis   . Chronic kidney disease   . Coronary artery disease   . Diabetes mellitus without complication ([place])   . Diverticulitis   . Hypertension   . Hypothyroidism   . Myocardial infarction ([place])   . Thyroid disease     Past Surgical History:  Procedure Laterality Date  . APPENDECTOMY  1936  . PARS PLANA VITRECTOMY Right 12/04/2018   Procedure: RETINAL DETACHMENT REPAIR PPV 25 GAUGE WITH ENDO LASER AIR/FLUID EXCHANGE SF6 GAS INJECTION;  Surgeon: Jalene Mullet, MD;  Location: [facility];  Service: Ophthalmology;  Laterality: Right;  . TONSILLECTOMY       reports that she has quit smoking. She has never used smokeless tobacco. She reports current alcohol use. She reports that she does  not use drugs.  Allergies  Allergen Reactions  . Tape Itching and Rash    Surgical tape from cyst removed    Family History  Problem Relation Age of Onset  . Arthritis Mother   . Heart disease Mother   . Hypertension Mother   . Arthritis Father   . Heart attack Father   . Heart disease Father   . Arthritis Daughter   . Arthritis Son   . Depression Maternal Aunt   . Hyperlipidemia Maternal Aunt   . Hypertension Maternal Aunt     Prior to Admission medications   Medication Sig Start Date End Date Taking? Authorizing Provider  acetaminophen (TYLENOL) 650 MG CR tablet Take 650 mg by mouth every 8 (eight) hours as needed for pain.   Yes [provider]  anastrozole (ARIMIDEX) 1 MG tablet Take 1 tablet (1 mg total) by mouth daily. 03/18/19  Yes Magrinat, Virgie Dad, MD  apixaban (ELIQUIS) 2.5 MG TABS tablet Take 1 tablet (2.5 mg total) by mouth 2 (two) times daily. 03/10/19  Yes Loel Dubonnet, NP  aspirin EC 81 MG tablet Take 81 mg by mouth daily.   Yes [provider]  atorvastatin (LIPITOR) 40 MG tablet Take 1 tablet (40 mg total) by mouth daily. 03/02/19  Yes Midge Minium, MD  Azelastine-Fluticasone [phone] MCG/ACT SUSP Place 1 spray into both nostrils 2 (two) times daily.    Yes [provider]  b complex vitamins capsule Take 1 capsule by  mouth daily.   Yes [provider]  Biotin 1 MG CAPS Take 1 mg by mouth daily.    Yes [provider]  carvedilol (COREG) 12.5 MG tablet Take 1 tablet (12.5 mg total) by mouth 2 (two) times daily with a meal. 03/02/19  Yes Midge Minium, MD  Cholecalciferol (VITAMIN D3) 50 MCG (2000 UT) TABS Take 1 capsule by mouth daily.   Yes [provider]  fenofibrate 160 MG tablet Take 1 tablet (160 mg total) by mouth daily. 12/21/18  Yes Midge Minium, MD  Ferrous Sulfate (IRON) 325 (65 Fe) MG TABS Take 1 tablet by mouth daily.   Yes [provider]  furosemide (LASIX) 20 MG  tablet TAKE 1 TABLET BY MOUTH EVERY DAY Patient taking differently: Take 20 mg by mouth daily.  03/01/19  Yes Midge Minium, MD  hydrALAZINE (APRESOLINE) 50 MG tablet Take 1 tablet (50 mg total) by mouth 3 (three) times daily. 02/14/19  Yes Park Liter, MD  isosorbide mononitrate (IMDUR) 30 MG 24 hr tablet Take 1 tablet (30 mg total) by mouth daily. 03/02/19  Yes Midge Minium, MD  levothyroxine (SYNTHROID) 150 MCG tablet Take 1 tablet (150 mcg total) by mouth daily before breakfast. 03/02/19  Yes Midge Minium, MD  magnesium oxide (MAG-OX) 400 MG tablet Take 400 mg by mouth daily.   Yes [provider]  nitroGLYCERIN (NITROSTAT) 0.4 MG SL tablet Place 0.4 mg under the tongue every 5 (five) minutes as needed for chest pain.   Yes [provider]  quinapril (ACCUPRIL) 20 MG tablet Take 20 mg by mouth daily.  12/23/18  Yes [provider]  vitamin C (ASCORBIC ACID) 500 MG tablet Take 500 mg by mouth daily.   Yes [provider]  fluticasone (FLONASE) 50 MCG/ACT nasal spray SPRAY 2 SPRAYS INTO EACH NOSTRIL EVERY DAY Patient not taking: Reported on 05/25/2019 03/04/19   Midge Minium, MD    Physical Exam: Constitutional: Moderately built and nourished. Vitals:   05/25/19 0230 05/25/19 0300 05/25/19 0400 05/25/19 0415  BP: (!) 188/58 (!) 182/69 (!) 166/64 (!) 167/26  Pulse: (!) 54 64 (!) 59 (!) 58  Resp: (!) 23 (!) 26 (!) 21 (!) 21  Temp:      TempSrc:      SpO2: 99% 98% 99% 99%   Eyes: Nonicteric no pallor. ENMT: No discharge from the ears eyes nose or mouth. Neck: No mass or.  No neck rigidity.  JVD elevated. Respiratory: No rhonchi or crepitations. Cardiovascular: S1-S2 heard. Abdomen: Soft nontender bowel sound present. Musculoskeletal: No edema. Skin: No rash.  Chronic skin changes. Neurologic: Alert awake oriented time place and person.  Moves all extremities. Psychiatric: Appears normal per normal affect.   Labs on  Admission: I have personally reviewed following labs and imaging studies  CBC: Recent Labs  Lab 05/24/19 2036  WBC 7.2  HGB 9.5*  HCT 31.1*  MCV 98.4  PLT 945   Basic Metabolic Panel: Recent Labs  Lab 05/24/19 2036  NA 138  K 4.0  CL 106  CO2 26  GLUCOSE 171*  BUN 53*  CREATININE 2.08*  CALCIUM 8.7*   GFR: Estimated Creatinine Clearance: 19.1 mL/min (A) (by C-G formula based on SCr of 2.08 mg/dL (H)). Liver Function Tests: No results for input(s): AST, ALT, ALKPHOS, BILITOT, PROT, ALBUMIN in the last 168 hours. No results for input(s): LIPASE, AMYLASE in the last 168 hours. No results for input(s): AMMONIA in  the last 168 hours. Coagulation Profile: No results for input(s): INR, PROTIME in the last 168 hours. Cardiac Enzymes: No results for input(s): CKTOTAL, CKMB, CKMBINDEX, TROPONINI in the last 168 hours. BNP (last 3 results) No results for input(s): PROBNP in the last 8760 hours. HbA1C: No results for input(s): HGBA1C in the last 72 hours. CBG: No results for input(s): GLUCAP in the last 168 hours. Lipid Profile: No results for input(s): CHOL, HDL, LDLCALC, TRIG, CHOLHDL, LDLDIRECT in the last 72 hours. Thyroid Function Tests: No results for input(s): TSH, T4TOTAL, FREET4, T3FREE, THYROIDAB in the last 72 hours. Anemia Panel: No results for input(s): VITAMINB12, FOLATE, FERRITIN, TIBC, IRON, RETICCTPCT in the last 72 hours. Urine analysis: No results found for: COLORURINE, APPEARANCEUR, LABSPEC, PHURINE, GLUCOSEU, HGBUR, BILIRUBINUR, KETONESUR, PROTEINUR, UROBILINOGEN, NITRITE, LEUKOCYTESUR Sepsis Labs: @LABRCNTIP (procalcitonin:4,lacticidven:4) )No results found for this or any previous visit (from the past 240 hour(s)).   Radiological Exams on Admission: DG Chest 2 View  Result Date: 05/24/2019 CLINICAL DATA:  Shortness of breath. EXAM: CHEST - 2 VIEW COMPARISON:  None. FINDINGS: Mild cardiomegaly is noted. Atherosclerosis of thoracic aorta is noted. No  pneumothorax is noted. Right lung is clear. Moderate left pleural effusion is noted with probable underlying atelectasis or infiltrate. Bony thorax is unremarkable. IMPRESSION: Moderate left pleural effusion with probable underlying atelectasis or infiltrate. Aortic Atherosclerosis (ICD10-I70.0). Electronically Signed   By: Marijo Conception M.D.   On: 05/24/2019 20:55    EKG: Independently reviewed.  Normal sinus rhythm with PVCs.  Assessment/Plan Principal Problem:   Acute CHF (congestive heart failure) (HCC) Active Problems:   Hypothyroid   HTN (hypertension)   CAD (coronary artery disease)   Mitral regurgitation moderate to severe based on echocardiogram in summer 2020   Malignant neoplasm of upper-outer quadrant of left breast in female, estrogen receptor positive (HCC)   CKD stage G4/A1, GFR 15-29 and albumin creatinine ratio <30 mg/g (HCC)   Anemia associated with stage 4 chronic renal failure (HCC)   Non-ST elevation MI (NSTEMI) ([place])    1. Acute CHF likely diastolic with history of severe mitral regurgitation last EF measured was in December 2020 over 60 to 65%.  Patient received 1 dose of Lasix 40 mg IV I have placed patient on 20 mg IV every 12.  Patient is Imdur and hydralazine.  Closely follow intake output metabolic panel. 2. Elevated troponin likely demand as per the cardiology.  Will trend cardiac markers patient is on antiplatelet agents Eliquis carvedilol and statins. 3. Hypertension uncontrolled likely contributing to symptoms.  Patient is presently on hydralazine and Imdur.  In addition patient is also on Coreg.  Follow blood pressure trends. 4. Diabetes mellitus type 2 we will keep patient on sliding scale coverage. 5. Breast cancer on anastrozole. 6. Hypothyroidism on Synthroid check TSH. 7. Chronic kidney disease stage IV creatinine appears to be at baseline. 8. Chronic anemia likely from renal disease appears to be at baseline.  Given that patient has acute CHF with  severe mitral vegetation with elevated troponin and chronic kidney disease will need close monitoring for any further deterioration of worsening and will need inpatient status.   DVT prophylaxis: Apixaban. Code Status: DNR as confirmed with patient. Family Communication: Discussed with patient. Disposition Plan: Home. Consults called: Cardiology. Admission status: Inpatient.   Rise Patience MD [facility] Pager [phone].  If 7PM-7AM, please contact night-coverage [email or web address] Password [email or web address]  05/25/2019, 5:30 AM

## 2019-05-25 NOTE — Evaluation (Signed)
Physical Therapy Evaluation Patient Details Name: Lauren Walker MRN: [id number] DOB: [birth year] Today's Date: 05/25/2019   History of Present Illness  Pt is 86 yo female who presented to [facility] with increasing SOB and edema. She was found to have elevated BNP, left sided pleural effusion and elevated troponins(which are trending downward).  Admitted for acute CHF with pleural effusion. PMHx: DMT2, Afib, CAD, CKD stage IV, Breast CA.  Clinical Impression  Pt admitted with above diagnosis. Pt presenting with mild deficits in mobility and endurance and strength.  She was able to ambulate and transfer with supervision but fatigued easily and cued for pursed lip breathing.  Her O2 sats did decrease with activity but remained >93% with short distance ambulation. Pt currently with functional limitations due to the deficits listed below (see PT Problem List). Pt will benefit from skilled PT to increase their independence and safety with mobility to allow discharge to the venue listed below.       Follow Up Recommendations No PT follow up    Equipment Recommendations  None recommended by PT    Recommendations for Other Services       Precautions / Restrictions Precautions Precautions: Other (comment) Precaution Comments: on RA watch O2 with activity Restrictions Weight Bearing Restrictions: No      Mobility  Bed Mobility Overal bed mobility: Modified Independent             General bed mobility comments: in chair at arrival  Transfers Overall transfer level: Needs assistance Equipment used: None Transfers: Sit to/from Stand Sit to Stand: Supervision            Ambulation/Gait Ambulation/Gait assistance: Supervision Gait Distance (Feet): 100 Feet Assistive device: Rolling walker (2 wheeled) Gait Pattern/deviations: Decreased stride length;Decreased stance time - right;Trendelenburg     General Gait Details: Pt reports R LE shorter than L LE and this is her typical gait  pattern other than fatigues easily  Stairs            Wheelchair Mobility    Modified Rankin (Stroke Patients Only)       Balance Overall balance assessment: Needs assistance   Sitting balance-Leahy Scale: Normal       Standing balance-Leahy Scale: Good                               Pertinent Vitals/Pain Pain Assessment: No/denies pain    Home Living Family/patient expects to be discharged to:: Private residence Living Arrangements: Spouse/significant other Available Help at Discharge: Family Type of Home: Apartment Home Access: Level entry     Home Layout: One level Home Equipment: Cane - quad      Prior Function Level of Independence: Independent with assistive device(s)         Comments: Pt takes quad cane out into community; ambulates with no AD; reports independence with ADL and spouse assists with IADL household chores.     Hand Dominance   Dominant Hand: Right    Extremity/Trunk Assessment   Upper Extremity Assessment Upper Extremity Assessment: Defer to OT evaluation    Lower Extremity Assessment Lower Extremity Assessment: Generalized weakness    Cervical / Trunk Assessment Cervical / Trunk Assessment: Normal  Communication   Communication: No difficulties  Cognition Arousal/Alertness: Awake/alert Behavior During Therapy: WFL for tasks assessed/performed Overall Cognitive Status: Within Functional Limits for tasks assessed  General Comments General comments (skin integrity, edema, etc.): On RA with sat97% rest and down to 93% with ambulation.  Pt was cued for pursed lip breathing.    Exercises     Assessment/Plan    PT Assessment Patient needs continued PT services  PT Problem List Decreased strength;Decreased mobility;Decreased activity tolerance;Decreased balance;Decreased knowledge of use of DME;Cardiopulmonary status limiting activity       PT  Treatment Interventions DME instruction;Therapeutic activities;Gait training;Therapeutic exercise;Patient/family education;Balance training;Functional mobility training    PT Goals (Current goals can be found in the Care Plan section)  Acute Rehab PT Goals Patient Stated Goal: to go home PT Goal Formulation: With patient/family Time For Goal Achievement: 06/08/19 Potential to Achieve Goals: Good    Frequency Min 3X/week   Barriers to discharge        Co-evaluation               AM-PAC PT "6 Clicks" Mobility  Outcome Measure Help needed turning from your back to your side while in a flat bed without using bedrails?: None Help needed moving from lying on your back to sitting on the side of a flat bed without using bedrails?: None Help needed moving to and from a bed to a chair (including a wheelchair)?: None Help needed standing up from a chair using your arms (e.g., wheelchair or bedside chair)?: None Help needed to walk in hospital room?: None Help needed climbing 3-5 steps with a railing? : A Little 6 Click Score: 23    End of Session Equipment Utilized During Treatment: Gait belt Activity Tolerance: Patient tolerated treatment well Patient left: in chair;with call bell/phone within reach;with family/visitor present(discussed elevating feet after she eats) Nurse Communication: Mobility status PT Visit Diagnosis: Other abnormalities of gait and mobility (R26.89)    Time: 1715-1735 PT Time Calculation (min) (ACUTE ONLY): 20 min   Charges:   PT Evaluation $PT Eval Low Complexity: 1 Low          Maggie Font, PT Acute Rehab Services Pager [phone] [facility] [phone] [facility] [place] 05/25/2019, 5:44 PM

## 2019-05-25 NOTE — ED Notes (Signed)
Attempted report 

## 2019-05-25 NOTE — Evaluation (Signed)
Occupational Therapy Evaluation Patient Details Name: Lauren Walker MRN: [id number] DOB: March 16, 1929 Today's Date: 05/25/2019    History of Present Illness Pt is an 86 yo female s/p SOB with BLE edema, moderate L pleural effusion adn elevated troponin. PMHx: DMT2, Afib, CAD, CKD stage IV, Breast CA.   Clinical Impression   Pt PTA: living at home with spouse and independent. Pt currently requiring no physical assist for ADL or mobility. Pt's O2 level desats to 89-90% on RA after 40' ambulation exertion. Pt performing pursed lip breathing >90% O2 and within 2 minutes 98% O2. RN aware. Pt's spouse appears supportive and can assist as needed. Pt does not require continued OT skilled services as pt at her functional baseline. No further OT skilled services required at this time. OT signing off.    Follow Up Recommendations  No OT follow up    Equipment Recommendations  None recommended by OT    Recommendations for Other Services       Precautions / Restrictions Precautions Precautions: Other (comment) Precaution Comments: on RA watch O2 with activity Restrictions Weight Bearing Restrictions: No      Mobility Bed Mobility Overal bed mobility: Modified Independent                Transfers Overall transfer level: Modified independent Equipment used: None                  Balance Overall balance assessment: Modified Independent                                         ADL either performed or assessed with clinical judgement   ADL Overall ADL's : At baseline                                       General ADL Comments: Pt performing own grooming at sink in standing; pt reports independently performing own toilet hygiene and ambulatory in room and hallway with no AD and no LOB episodes     Vision Baseline Vision/History: No visual deficits Patient Visual Report: No change from baseline Vision Assessment?: No apparent visual  deficits     Perception     Praxis      Pertinent Vitals/Pain       Hand Dominance Right   Extremity/Trunk Assessment Upper Extremity Assessment Upper Extremity Assessment: Generalized weakness       Cervical / Trunk Assessment Cervical / Trunk Assessment: Normal   Communication Communication Communication: No difficulties   Cognition Arousal/Alertness: Awake/alert Behavior During Therapy: WFL for tasks assessed/performed Overall Cognitive Status: Within Functional Limits for tasks assessed                                     General Comments  Pt's O2 level desats to 89-90% on RA after 40' ambulation exertion. Pt performing pursed lip breathing >90% O2 and within 2 minutes 98% O2.    Exercises     Shoulder Instructions      Home Living Family/patient expects to be discharged to:: Private residence Living Arrangements: Spouse/significant other Available Help at Discharge: Family Type of Home: Apartment Home Access: Level entry     Home Layout: One level     Bathroom  Shower/Tub: Teacher, early years/pre: Handicapped height     Home Equipment: Cane - quad          Prior Functioning/Environment Level of Independence: Independent with assistive device(s)        Comments: Pt takes quad cane out into community; ambulates with no AD; reports independence with ADL and spouse assists with IADL household chores.        OT Problem List:        OT Treatment/Interventions:      OT Goals(Current goals can be found in the care plan section) Acute Rehab OT Goals Patient Stated Goal: to go home  OT Frequency:     Barriers to D/C:            Co-evaluation              AM-PAC OT "6 Clicks" Daily Activity     Outcome Measure Help from another person eating meals?: None Help from another person taking care of personal grooming?: None Help from another person toileting, which includes using toliet, bedpan, or urinal?:  None Help from another person bathing (including washing, rinsing, drying)?: None Help from another person to put on and taking off regular upper body clothing?: None Help from another person to put on and taking off regular lower body clothing?: None 6 Click Score: 24   End of Session Equipment Utilized During Treatment: Gait belt Nurse Communication: Mobility status  Activity Tolerance: Patient tolerated treatment well Patient left: in chair;with call bell/phone within reach;with family/visitor present  OT Visit Diagnosis: Unsteadiness on feet (R26.81);Muscle weakness (generalized) (M62.81)                Time: 2035-5974 OT Time Calculation (min): 21 min Charges:  OT General Charges $OT Visit: 1 Visit OT Evaluation $OT Eval Moderate Complexity: 1 Mod  Jefferey Pica, OTR/L Acute Rehabilitation Services Pager: [phone] Office: [phone]   Eugenie Harewood C 05/25/2019, 4:52 PM

## 2019-05-25 NOTE — Progress Notes (Signed)
Patient is 86 year old female with history of severe mitral regurgitation, paroxysmal A. fib, coronary artery disease, CKD stage IV, diabetes type 2, hypothyroidism, breast cancer, chronic anemia who presents to the emergency room with complaint of shortness of breath.  She was also having increased bilateral lower extremity edema over few weeks and became acutely short of breath a day before admission.  On presentation, x-ray of the chest showed moderate left pleural effusion with possible infiltrates likely due to pulmonary edema.  Troponin was elevated.  Elevated BNP.  Cardiology consult for evaluation of acute congestive heart failure with possible non-STEMI. She was given a dose of IV Lasix 40 mg once and has been on 20 mg IV every 12 hours.  She has elevated troponin which could be from supply demand ischemia for acute CHF ,we will follow-up echocardiogram.  Her last echocardiogram on 01/2019 showed normal ejection fraction. EKG did not show any new ischemic changes.She is on anastrozole for breast cancer and follows with oncology,Dr Magrinat.  Her kidney function is at baseline. Home medications hydralazine, Imdur, aspirin and carvedilol restarted.  She is on Eliquis for her history of A. Fib. Cardiology following her. Patient seen and examined at the bedside this morning.  She appeared very comfortable, was not in any kind of distress. She was mildly bradycardiac,in Afib. Denies any chest pain or shortness of breath.  She has severe bilateral lower extremity edema.We have consulted PT/OT. Patient seen by Dr. Hal Hope this morning.  I agree with assessment and plan. I called her daughter on phone and discussed in detail on 05/25/2019

## 2019-05-25 NOTE — Progress Notes (Signed)
  Echocardiogram 2D Echocardiogram has been performed.  Jennette Dubin 05/25/2019, 4:06 PM

## 2019-05-25 NOTE — ED Notes (Signed)
Report given to [employer]

## 2019-05-26 ENCOUNTER — Telehealth: Payer: Self-pay | Admitting: Oncology

## 2019-05-26 DIAGNOSIS — I5041 Acute combined systolic (congestive) and diastolic (congestive) heart failure: Secondary | ICD-10-CM

## 2019-05-26 LAB — GLUCOSE, CAPILLARY
Glucose-Capillary: 123 mg/dL — ABNORMAL HIGH (ref 70–99)
Glucose-Capillary: 126 mg/dL — ABNORMAL HIGH (ref 70–99)
Glucose-Capillary: 182 mg/dL — ABNORMAL HIGH (ref 70–99)

## 2019-05-26 LAB — HEMOGLOBIN A1C
Hgb A1c MFr Bld: 5.3 % (ref 4.8–5.6)
Mean Plasma Glucose: 105 mg/dL

## 2019-05-26 LAB — BASIC METABOLIC PANEL
Anion gap: 10 (ref 5–15)
BUN: 54 mg/dL — ABNORMAL HIGH (ref 8–23)
CO2: 28 mmol/L (ref 22–32)
Calcium: 8.5 mg/dL — ABNORMAL LOW (ref 8.9–10.3)
Chloride: 102 mmol/L (ref 98–111)
Creatinine, Ser: 2.22 mg/dL — ABNORMAL HIGH (ref 0.44–1.00)
GFR calc Af Amer: 22 mL/min — ABNORMAL LOW (ref >60)
GFR calc non Af Amer: 19 mL/min — ABNORMAL LOW (ref >60)
Glucose, Bld: 112 mg/dL — ABNORMAL HIGH (ref 70–99)
Potassium: 4 mmol/L (ref 3.5–5.1)
Sodium: 140 mmol/L (ref 135–145)

## 2019-05-26 MED ORDER — FUROSEMIDE 10 MG/ML IJ SOLN
40.0000 mg | Freq: Two times a day (BID) | INTRAMUSCULAR | Status: DC
  Administered 2019-05-26 – 2019-05-29 (×6): 40 mg via INTRAVENOUS
  Filled 2019-05-26 (×6): qty 4

## 2019-05-26 NOTE — Progress Notes (Signed)
PROGRESS NOTE    Lauren Walker  UMP:[id number] DOB: 02/24/1929 DOA: 05/24/2019 PCP: Midge Minium, MD   Brief Narrative: Patient is 86 year old female with history of severe mitral regurgitation, paroxysmal A. fib, coronary artery disease, CKD stage IV, diabetes type 2, hypothyroidism, breast cancer, chronic anemia who presents to the emergency room with complaint of shortness of breath.  She was also having increased bilateral lower extremity edema over few weeks and became acutely short of breath a day before admission.  On presentation, x-ray of the chest showed moderate left pleural effusion with possible infiltrates likely due to pulmonary edema.  Troponin was elevated.  Elevated BNP.  Cardiology consulted for evaluation of acute congestive heart failure with possible non-STEMI. She was started on IV Lasix.  Assessment & Plan:   Principal Problem:   Acute CHF (congestive heart failure) (HCC) Active Problems:   Hypothyroid   HTN (hypertension)   CAD (coronary artery disease)   Mitral regurgitation moderate to severe based on echocardiogram in summer 2020   Malignant neoplasm of upper-outer quadrant of left breast in female, estrogen receptor positive ([place])   CKD stage G4/A1, GFR 15-29 and albumin creatinine ratio <30 mg/g (HCC)   Anemia associated with stage 4 chronic renal failure (HCC)   Non-ST elevation MI (NSTEMI) (HCC)   Acute systolic CHF: Presented with volume overload, bilateral lower extremity edema, shortness of breath, chest pain.  Echocardiogram done here showed ejection fraction of 40 to 45%, regional wall motion abnormality in the left ventricle, moderate LVH. Currently on IV Lasix 40 mg IV twice daily.  Cardiology following.  Monitor input/output, daily weight  Moderate/severe mitral regurgitation: As per echo.  Management as per cardiology.  Chest pain/elevated troponin/history of coronary artery disease: Chest pain has resolved.  EKG did not show any ischemic  changes.  Elevated troponin could be associated with congestive heart failure but echo has shown regional wall motion abnormality in the left ventricle.   On aspirin at home.  CKD stage IIIb: Baseline creatinine ranges from 2-2.5.  Currently kidney function at baseline.  Monitor closely.  Hypertension: Currently blood pressure stable.  Continue current medications.  She is on hydralazine, Imdur, carvedilol.  Paroxysmal A. fib: On Eliquis for anticoagulation.  Currently rate is controlled.  On carvedilol.  Left breast cancer: Estrogen receptor positive.  Follows with Dr. Jana Hakim.  On anastrozole.  Hyperlipidemia: On Lipitor  Hypothyroidism: On Synthyroid  Normocytic anemia: Most likely associated with CKD.  Currently hemoglobin stable.                 DVT prophylaxis:Eliquis Code Status: DNR Family Communication: Discussed with daughter on phone on 05/25/2019 Disposition Plan: Patient is from home.  Needs cardiology clearance/PT evaluation before discharge. On IV lasix.  Expect discharge planning to home when she is ready   Consultants: Cardiology  Procedures: None  Antimicrobials:  Anti-infectives (From admission, onward)   None      Subjective: Patient seen and examined at the bedside this morning.  Hemodynamically stable.  Comfortable.  Sitting on the chair.Has severe bilateral lower extremity edema.  Denies any shortness of breath or chest pain.  Objective: Vitals:   05/25/19 2012 05/26/19 0050 05/26/19 0500 05/26/19 0525  BP: (!) 152/47 (!) 114/46  (!) 135/51  Pulse: 62 78  72  Resp: 20 19  18   Temp: 98.2 F (36.8 C) 97.7 F (36.5 C)  98.1 F (36.7 C)  TempSrc: Oral Oral  Oral  SpO2: 95% 95%  94%  Weight:   79.2 kg   Height:        Intake/Output Summary (Last 24 hours) at 05/26/2019 0723 Last data filed at 05/26/2019 0523 Gross per 24 hour  Intake 840 ml  Output 1100 ml  Net -260 ml   Filed Weights   05/25/19 0600 05/25/19 1451 05/26/19 0500    Weight: 79.6 kg 79.2 kg 79.2 kg    Examination:  General exam: Very pleasant elderly female  Respiratory system: Mildly diminished air entry in bilateral bases, no crackles or wheezes Cardiovascular system: S1 & S2 heard, RRR.  Pansystolic murmur Gastrointestinal system: Abdomen is nondistended, soft and nontender. No organomegaly or masses felt. Normal bowel sounds heard. Central nervous system: Alert and oriented. No focal neurological deficits. Extremities: 3-4+ bilateral lower extremity edema, no clubbing ,no cyanosis Skin: No rashes, lesions or ulcers,no icterus ,no pallor  Data Reviewed: I have personally reviewed following labs and imaging studies  CBC: Recent Labs  Lab 05/24/19 2036 05/25/19 0617  WBC 7.2 7.5  NEUTROABS  --  5.4  HGB 9.5* 9.4*  HCT 31.1* 30.0*  MCV 98.4 98.0  PLT 267 562   Basic Metabolic Panel: Recent Labs  Lab 05/24/19 2036 05/25/19 0617 05/26/19 0457  NA 138 141 140  K 4.0 4.0 4.0  CL 106 103 102  CO2 26 26 28   GLUCOSE 171* 130* 112*  BUN 53* 51* 54*  CREATININE 2.08* 2.04* 2.22*  CALCIUM 8.7* 8.7* 8.5*   GFR: Estimated Creatinine Clearance: 17.8 mL/min (A) (by C-G formula based on SCr of 2.22 mg/dL (H)). Liver Function Tests: Recent Labs  Lab 05/25/19 0617  AST 27  ALT 14  ALKPHOS 18*  BILITOT 0.5  PROT 5.5*  ALBUMIN 3.1*   No results for input(s): LIPASE, AMYLASE in the last 168 hours. No results for input(s): AMMONIA in the last 168 hours. Coagulation Profile: No results for input(s): INR, PROTIME in the last 168 hours. Cardiac Enzymes: No results for input(s): CKTOTAL, CKMB, CKMBINDEX, TROPONINI in the last 168 hours. BNP (last 3 results) No results for input(s): PROBNP in the last 8760 hours. HbA1C: Recent Labs    05/25/19 0617  HGBA1C 5.3   CBG: Recent Labs  Lab 05/25/19 0809 05/25/19 1337 05/25/19 1632 05/25/19 2109 05/26/19 0529  GLUCAP 121* 142* 108* 158* 123*   Lipid Profile: No results for  input(s): CHOL, HDL, LDLCALC, TRIG, CHOLHDL, LDLDIRECT in the last 72 hours. Thyroid Function Tests: Recent Labs    05/25/19 0617  TSH 1.130   Anemia Panel: No results for input(s): VITAMINB12, FOLATE, FERRITIN, TIBC, IRON, RETICCTPCT in the last 72 hours. Sepsis Labs: No results for input(s): PROCALCITON, LATICACIDVEN in the last 168 hours.  Recent Results (from the past 240 hour(s))  SARS CORONAVIRUS 2 (TAT 6-24 HRS) Nasopharyngeal Nasopharyngeal Swab     Status: None   Collection Time: 05/25/19  1:35 AM   Specimen: Nasopharyngeal Swab  Result Value Ref Range Status   SARS Coronavirus 2 NEGATIVE NEGATIVE Final    Comment: (NOTE) SARS-CoV-2 target nucleic acids are NOT DETECTED. The SARS-CoV-2 RNA is generally detectable in upper and lower respiratory specimens during the acute phase of infection. Negative results do not preclude SARS-CoV-2 infection, do not rule out co-infections with other pathogens, and should not be used as the sole basis for treatment or other patient management decisions. Negative results must be combined with clinical observations, patient history, and epidemiological information. The expected result is Negative. Fact Sheet for Patients: SugarRoll.be Fact Sheet for  Healthcare Providers: https://www.woods-mathews.com/ This test is not yet approved or cleared by the [place] and  has been authorized for detection and/or diagnosis of SARS-CoV-2 by FDA under an Emergency Use Authorization (EUA). This EUA will remain  in effect (meaning this test can be used) for the duration of the COVID-19 declaration under Section 56 4(b)(1) of the Act, 21 U.S.C. section 360bbb-3(b)(1), unless the authorization is terminated or revoked sooner. Performed at [facility], [place] [address], [place], [place] [address]          Radiology Studies: DG Chest 2 View  Result Date: 05/24/2019 CLINICAL DATA:  Shortness  of breath. EXAM: CHEST - 2 VIEW COMPARISON:  None. FINDINGS: Mild cardiomegaly is noted. Atherosclerosis of thoracic aorta is noted. No pneumothorax is noted. Right lung is clear. Moderate left pleural effusion is noted with probable underlying atelectasis or infiltrate. Bony thorax is unremarkable. IMPRESSION: Moderate left pleural effusion with probable underlying atelectasis or infiltrate. Aortic Atherosclerosis (ICD10-I70.0). Electronically Signed   By: Marijo Conception M.D.   On: 05/24/2019 20:55   ECHOCARDIOGRAM COMPLETE  Result Date: 05/25/2019    ECHOCARDIOGRAM REPORT   Patient Name:   Frutoso Schatz Date of Exam: 05/25/2019 Medical Rec #:  [id number]      Height:       64.0 in Accession #:    [id number]     Weight:       175.4 lb Date of Birth:  1930/02/17      BSA:          1.850 m Patient Age:    67 years       BP:           165/58 mmHg Patient Gender: F              HR:           59 bpm. Exam Location:  Inpatient Procedure: 2D Echo Indications:    Congestive Heart Failure I50.31  History:        Patient has prior history of Echocardiogram examinations, most                 recent 02/16/2019.  Sonographer:    Mikki Santee RDCS (AE) Referring Phys: [id number] Meshach Perry IMPRESSIONS  1. Left ventricular ejection fraction, by estimation, is 45 to 50%. The left ventricle has mildly decreased function. The left ventricle demonstrates regional wall motion abnormalities (see scoring diagram/findings for description). There is moderate left ventricular hypertrophy. Left ventricular diastolic parameters are indeterminate. There is moderate hypokinesis of the left ventricular, entire inferior wall.  2. Right ventricular systolic function is normal. The right ventricular size is normal. There is normal pulmonary artery systolic pressure.  3. Left atrial size was severely dilated.  4. Large pleural effusion in the left lateral region.  5. The mitral valve is grossly normal. Moderate to severe mitral valve  regurgitation.  6. The aortic valve is tricuspid. Aortic valve regurgitation is not visualized. Mild to moderate aortic valve stenosis. Aortic valve area, by VTI measures 1.49 cm. Aortic valve mean gradient measures 10.4 mmHg. Aortic valve Vmax measures 2.17 m/s.  7. The inferior vena cava is dilated in size with >50% respiratory variability, suggesting right atrial pressure of 8 mmHg. Comparison(s): 02/16/19: LVEF 60-65%, severe MR, severe LAE. FINDINGS  Left Ventricle: Left ventricular ejection fraction, by estimation, is 45 to 50%. The left ventricle has mildly decreased function. The left ventricle demonstrates regional wall motion abnormalities. Moderate hypokinesis  of the left ventricular, entire inferior wall. The left ventricular internal cavity size was normal in size. There is moderate left ventricular hypertrophy. Left ventricular diastolic parameters are indeterminate. Right Ventricle: The right ventricular size is normal. No increase in right ventricular wall thickness. Right ventricular systolic function is normal. There is normal pulmonary artery systolic pressure. The tricuspid regurgitant velocity is 2.29 m/s, and  with an assumed right atrial pressure of 8 mmHg, the estimated right ventricular systolic pressure is 53.6 mmHg. Left Atrium: Left atrial size was severely dilated. Right Atrium: Right atrial size was normal in size. Pericardium: There is no evidence of pericardial effusion. Mitral Valve: The mitral valve is grossly normal. Moderate to severe mitral valve regurgitation, with posteriorly-directed jet. Tricuspid Valve: The tricuspid valve is grossly normal. Tricuspid valve regurgitation is trivial. Aortic Valve: The aortic valve is tricuspid. Aortic valve regurgitation is not visualized. Mild to moderate aortic stenosis is present. Aortic valve mean gradient measures 10.4 mmHg. Aortic valve peak gradient measures 18.8 mmHg. Aortic valve area, by VTI measures 1.49 cm. Pulmonic Valve: The  pulmonic valve was grossly normal. Pulmonic valve regurgitation is trivial. Aorta: The aortic root and ascending aorta are structurally normal, with no evidence of dilitation. Venous: The inferior vena cava is dilated in size with greater than 50% respiratory variability, suggesting right atrial pressure of 8 mmHg. IAS/Shunts: No atrial level shunt detected by color flow Doppler. Additional Comments: There is a large pleural effusion in the left lateral region.  LEFT VENTRICLE PLAX 2D LVIDd:         4.60 cm  Diastology LVIDs:         3.80 cm  LV e' lateral:   6.97 cm/s LV PW:         1.30 cm  LV E/e' lateral: 12.6 LV IVS:        1.30 cm  LV e' medial:    4.79 cm/s LVOT diam:     2.30 cm  LV E/e' medial:  18.3 LV SV:         85 LV SV Index:   46 LVOT Area:     4.15 cm  RIGHT VENTRICLE RV S prime:     14.50 cm/s TAPSE (M-mode): 1.8 cm LEFT ATRIUM              Index       RIGHT ATRIUM           Index LA diam:        4.00 cm  2.16 cm/m  RA Area:     17.60 cm LA Vol (A2C):   114.0 ml 61.61 ml/m RA Volume:   40.30 ml  21.78 ml/m LA Vol (A4C):   67.8 ml  36.64 ml/m LA Biplane Vol: 94.1 ml  50.86 ml/m  AORTIC VALVE AV Area (Vmax):    1.36 cm AV Area (Vmean):   1.33 cm AV Area (VTI):     1.49 cm AV Vmax:           216.80 cm/s AV Vmean:          151.600 cm/s AV VTI:            0.571 m AV Peak Grad:      18.8 mmHg AV Mean Grad:      10.4 mmHg LVOT Vmax:         70.80 cm/s LVOT Vmean:        48.400 cm/s LVOT VTI:  0.204 m LVOT/AV VTI ratio: 0.36  AORTA Ao Root diam: 3.20 cm MITRAL VALVE               TRICUSPID VALVE MV Area (PHT): 3.65 cm    TR Peak grad:   21.0 mmHg MV Decel Time: 208 msec    TR Vmax:        229.00 cm/s MR Peak grad: 103.2 mmHg MR Vmax:      508.00 cm/s  SHUNTS MV E velocity: 87.50 cm/s  Systemic VTI:  0.20 m MV A velocity: 33.40 cm/s  Systemic Diam: 2.30 cm MV E/A ratio:  2.62 Lyman Bishop MD Electronically signed by Lyman Bishop MD Signature Date/Time: 05/25/2019/4:51:06 PM    Final          Scheduled Meds: . anastrozole  1 mg Oral Daily  . apixaban  2.5 mg Oral BID  . vitamin C  500 mg Oral Daily  . aspirin EC  81 mg Oral Daily  . atorvastatin  40 mg Oral q1800  . fenofibrate  160 mg Oral Daily  . ferrous sulfate  325 mg Oral Daily  . furosemide  20 mg Intravenous Q12H  . hydrALAZINE  50 mg Oral TID  . insulin aspart  0-9 Units Subcutaneous TID WC  . isosorbide mononitrate  30 mg Oral Daily  . levothyroxine  150 mcg Oral QAC breakfast  . magnesium oxide  400 mg Oral Daily   Continuous Infusions:   LOS: 1 day    Time spent:35 mins. More than 50% of that time was spent in counseling and/or coordination of care.      Shelly Coss, MD [facility] P4/09/2019, 7:23 AM

## 2019-05-26 NOTE — Telephone Encounter (Signed)
Cancelled and called pt per 4/8 sch msg to reschedule appt

## 2019-05-26 NOTE — Progress Notes (Addendum)
Progress Note  Patient Name: Lauren Walker Date of Encounter: 05/26/2019  Primary Cardiologist: Jenne Campus, MD  Subjective   Doing well today. Reports good UO despite poor documentation. Continues to have 3+ pitting BLE. BP improved today therefore will challenge with increased IV Lasix dosing. Need to watch creatinine closely   Inpatient Medications    Scheduled Meds:  anastrozole  1 mg Oral Daily   apixaban  2.5 mg Oral BID   vitamin C  500 mg Oral Daily   aspirin EC  81 mg Oral Daily   atorvastatin  40 mg Oral q1800   fenofibrate  160 mg Oral Daily   ferrous sulfate  325 mg Oral Daily   furosemide  20 mg Intravenous Q12H   hydrALAZINE  50 mg Oral TID   insulin aspart  0-9 Units Subcutaneous TID WC   isosorbide mononitrate  30 mg Oral Daily   levothyroxine  150 mcg Oral QAC breakfast   magnesium oxide  400 mg Oral Daily   Continuous Infusions:  PRN Meds: acetaminophen **OR** acetaminophen, nitroGLYCERIN, ondansetron **OR** ondansetron (ZOFRAN) IV   Vital Signs    Vitals:   05/26/19 0500 05/26/19 0525 05/26/19 0853 05/26/19 0919  BP:  (!) 135/51 (!) 136/44 (!) 151/39  Pulse:  72 60 (!) 59  Resp:  18 20 16   Temp:  98.1 F (36.7 C) 98.4 F (36.9 C) 98.5 F (36.9 C)  TempSrc:  Oral Oral Oral  SpO2:  94% 97% 98%  Weight: 79.2 kg     Height:        Intake/Output Summary (Last 24 hours) at 05/26/2019 1004 Last data filed at 05/26/2019 0523 Gross per 24 hour  Intake 840 ml  Output 1100 ml  Net -260 ml   Filed Weights   05/25/19 0600 05/25/19 1451 05/26/19 0500  Weight: 79.6 kg 79.2 kg 79.2 kg    Physical Exam   General: Well developed, well nourished, NAD Neck: Negative for carotid bruits. No JVD Lungs: Diminished in LLL. No wheezes, rales, or rhonchi. Breathing is unlabored. Cardiovascular: RRR with S1 S2. + murmur Abdomen: Soft, non-tender, non-distended. No obvious abdominal masses. Extremities: 3+BLE  edema. Radial pulses 2+  bilaterally Neuro: Alert and oriented. No focal deficits. No facial asymmetry. MAE spontaneously. Psych: Responds to questions appropriately with normal affect.    Labs    Chemistry Recent Labs  Lab 05/24/19 2036 05/25/19 0617 05/26/19 0457  NA 138 141 140  K 4.0 4.0 4.0  CL 106 103 102  CO2 26 26 28   GLUCOSE 171* 130* 112*  BUN 53* 51* 54*  CREATININE 2.08* 2.04* 2.22*  CALCIUM 8.7* 8.7* 8.5*  PROT  --  5.5*  --   ALBUMIN  --  3.1*  --   AST  --  27  --   ALT  --  14  --   ALKPHOS  --  18*  --   BILITOT  --  0.5  --   GFRNONAA 21* 21* 19*  GFRAA 24* 25* 22*  ANIONGAP 6 12 10      Hematology Recent Labs  Lab 05/24/19 2036 05/25/19 0617  WBC 7.2 7.5  RBC 3.16* 3.06*  HGB 9.5* 9.4*  HCT 31.1* 30.0*  MCV 98.4 98.0  MCH 30.1 30.7  MCHC 30.5 31.3  RDW 14.0 14.1  PLT 267 233    Cardiac EnzymesNo results for input(s): TROPONINI in the last 168 hours. No results for input(s): TROPIPOC in the last 168  hours.   BNP Recent Labs  Lab 05/25/19 0135  BNP 2,812.1*     DDimer No results for input(s): DDIMER in the last 168 hours.   Radiology    DG Chest 2 View  Result Date: 05/24/2019 CLINICAL DATA:  Shortness of breath. EXAM: CHEST - 2 VIEW COMPARISON:  None. FINDINGS: Mild cardiomegaly is noted. Atherosclerosis of thoracic aorta is noted. No pneumothorax is noted. Right lung is clear. Moderate left pleural effusion is noted with probable underlying atelectasis or infiltrate. Bony thorax is unremarkable. IMPRESSION: Moderate left pleural effusion with probable underlying atelectasis or infiltrate. Aortic Atherosclerosis (ICD10-I70.0). Electronically Signed   By: Marijo Conception M.D.   On: 05/24/2019 20:55   ECHOCARDIOGRAM COMPLETE  Result Date: 05/25/2019    ECHOCARDIOGRAM REPORT   Patient Name:   Lauren Walker Date of Exam: 05/25/2019 Medical Rec #:  [id number]      Height:       64.0 in Accession #:    [id number]     Weight:       175.4 lb Date of Birth:  [birth year]       BSA:          1.850 m Patient Age:    86 years       BP:           165/58 mmHg Patient Gender: F              HR:           59 bpm. Exam Location:  Inpatient Procedure: 2D Echo Indications:    Congestive Heart Failure I50.31  History:        Patient has prior history of Echocardiogram examinations, most                 recent 02/16/2019.  Sonographer:    Mikki Santee RDCS (AE) Referring Phys: [id number] AMRIT ADHIKARI IMPRESSIONS  1. Left ventricular ejection fraction, by estimation, is 45 to 50%. The left ventricle has mildly decreased function. The left ventricle demonstrates regional wall motion abnormalities (see scoring diagram/findings for description). There is moderate left ventricular hypertrophy. Left ventricular diastolic parameters are indeterminate. There is moderate hypokinesis of the left ventricular, entire inferior wall.  2. Right ventricular systolic function is normal. The right ventricular size is normal. There is normal pulmonary artery systolic pressure.  3. Left atrial size was severely dilated.  4. Large pleural effusion in the left lateral region.  5. The mitral valve is grossly normal. Moderate to severe mitral valve regurgitation.  6. The aortic valve is tricuspid. Aortic valve regurgitation is not visualized. Mild to moderate aortic valve stenosis. Aortic valve area, by VTI measures 1.49 cm. Aortic valve mean gradient measures 10.4 mmHg. Aortic valve Vmax measures 2.17 m/s.  7. The inferior vena cava is dilated in size with >50% respiratory variability, suggesting right atrial pressure of 8 mmHg. Comparison(s): 02/16/19: LVEF 60-65%, severe MR, severe LAE. FINDINGS  Left Ventricle: Left ventricular ejection fraction, by estimation, is 45 to 50%. The left ventricle has mildly decreased function. The left ventricle demonstrates regional wall motion abnormalities. Moderate hypokinesis of the left ventricular, entire inferior wall. The left ventricular internal cavity size was normal in  size. There is moderate left ventricular hypertrophy. Left ventricular diastolic parameters are indeterminate. Right Ventricle: The right ventricular size is normal. No increase in right ventricular wall thickness. Right ventricular systolic function is normal. There is normal pulmonary artery systolic pressure. The tricuspid regurgitant velocity is 2.29 m/s,  and  with an assumed right atrial pressure of 8 mmHg, the estimated right ventricular systolic pressure is 02.4 mmHg. Left Atrium: Left atrial size was severely dilated. Right Atrium: Right atrial size was normal in size. Pericardium: There is no evidence of pericardial effusion. Mitral Valve: The mitral valve is grossly normal. Moderate to severe mitral valve regurgitation, with posteriorly-directed jet. Tricuspid Valve: The tricuspid valve is grossly normal. Tricuspid valve regurgitation is trivial. Aortic Valve: The aortic valve is tricuspid. Aortic valve regurgitation is not visualized. Mild to moderate aortic stenosis is present. Aortic valve mean gradient measures 10.4 mmHg. Aortic valve peak gradient measures 18.8 mmHg. Aortic valve area, by VTI measures 1.49 cm. Pulmonic Valve: The pulmonic valve was grossly normal. Pulmonic valve regurgitation is trivial. Aorta: The aortic root and ascending aorta are structurally normal, with no evidence of dilitation. Venous: The inferior vena cava is dilated in size with greater than 50% respiratory variability, suggesting right atrial pressure of 8 mmHg. IAS/Shunts: No atrial level shunt detected by color flow Doppler. Additional Comments: There is a large pleural effusion in the left lateral region.  LEFT VENTRICLE PLAX 2D LVIDd:         4.60 cm  Diastology LVIDs:         3.80 cm  LV e' lateral:   6.97 cm/s LV PW:         1.30 cm  LV E/e' lateral: 12.6 LV IVS:        1.30 cm  LV e' medial:    4.79 cm/s LVOT diam:     2.30 cm  LV E/e' medial:  18.3 LV SV:         85 LV SV Index:   46 LVOT Area:     4.15 cm  RIGHT  VENTRICLE RV S prime:     14.50 cm/s TAPSE (M-mode): 1.8 cm LEFT ATRIUM              Index       RIGHT ATRIUM           Index LA diam:        4.00 cm  2.16 cm/m  RA Area:     17.60 cm LA Vol (A2C):   114.0 ml 61.61 ml/m RA Volume:   40.30 ml  21.78 ml/m LA Vol (A4C):   67.8 ml  36.64 ml/m LA Biplane Vol: 94.1 ml  50.86 ml/m  AORTIC VALVE AV Area (Vmax):    1.36 cm AV Area (Vmean):   1.33 cm AV Area (VTI):     1.49 cm AV Vmax:           216.80 cm/s AV Vmean:          151.600 cm/s AV VTI:            0.571 m AV Peak Grad:      18.8 mmHg AV Mean Grad:      10.4 mmHg LVOT Vmax:         70.80 cm/s LVOT Vmean:        48.400 cm/s LVOT VTI:          0.204 m LVOT/AV VTI ratio: 0.36  AORTA Ao Root diam: 3.20 cm MITRAL VALVE               TRICUSPID VALVE MV Area (PHT): 3.65 cm    TR Peak grad:   21.0 mmHg MV Decel Time: 208 msec    TR Vmax:  229.00 cm/s MR Peak grad: 103.2 mmHg MR Vmax:      508.00 cm/s  SHUNTS MV E velocity: 87.50 cm/s  Systemic VTI:  0.20 m MV A velocity: 33.40 cm/s  Systemic Diam: 2.30 cm MV E/A ratio:  2.62 Lyman Bishop MD Electronically signed by Lyman Bishop MD Signature Date/Time: 05/25/2019/4:51:06 PM    Final    Telemetry    05/26/19 NSR with episodes of ST, SB - Personally Reviewed  ECG    No new tracing as of 05/26/19- Personally Reviewed  Cardiac Studies   Echo 05/25/19:  1. Left ventricular ejection fraction, by estimation, is 45 to 50%. The  left ventricle has mildly decreased function. The left ventricle  demonstrates regional wall motion abnormalities (see scoring  diagram/findings for description). There is moderate  left ventricular hypertrophy. Left ventricular diastolic parameters are  indeterminate. There is moderate hypokinesis of the left ventricular,  entire inferior wall.  2. Right ventricular systolic function is normal. The right ventricular  size is normal. There is normal pulmonary artery systolic pressure.  3. Left atrial size was severely  dilated.  4. Large pleural effusion in the left lateral region.  5. The mitral valve is grossly normal. Moderate to severe mitral valve  regurgitation.  6. The aortic valve is tricuspid. Aortic valve regurgitation is not  visualized. Mild to moderate aortic valve stenosis. Aortic valve area, by  VTI measures 1.49 cm. Aortic valve mean gradient measures 10.4 mmHg.  Aortic valve Vmax measures 2.17 m/s.  7. The inferior vena cava is dilated in size with >50% respiratory  variability, suggesting right atrial pressure of 8 mmHg.   Comparison(s): 02/16/19: LVEF 60-65%, severe MR, severe LAE.   Echo 02/16/2019:  1. Left ventricular ejection fraction, by visual estimation, is 60 to  65%. is mildly increased left ventricular hypertrophy.  2. Left ventricular diastolic parameters are indeterminate.  3. Mildly dilated left ventricular internal cavity size.  4. Global right ventricle has normal systolic function.The right  ventricular size is normal. No increase in right ventricular wall  thickness.  5. Left atrial size was severely dilated.  6. Right atrial size was normal.  7. Mild mitral valve prolapse.  8. Severe mitral valve regurgitation. No evidence of mitral stenosis.  9. The tricuspid valve is normal in structure.  10. The aortic valve is normal in structure. Aortic valve regurgitation is  not visualized. Mild aortic valve stenosis.  11. The pulmonic valve was normal in structure. Pulmonic valve  regurgitation is not visualized.  12. There is dilatation of the ascending aorta measuring 38 mm.  13. Mildly elevated pulmonary artery systolic pressure.  14. The inferior vena cava is normal in size with greater than 50%  respiratory variability, suggesting right atrial pressure of 3 mmHg.  15. Severe MR with jet directed posteriorly. Minimal prolaps of the middle  segment of the anterior leaflet of the mitral valve noted A2.   Patient Profile     86 y.o. female with  history of CAD (cath 2016 elsewhere, no report available, was told 1 occluded artery with collateral managed medically),paroxysmal atrial fibrillation, chronic diastolic CHF,severe mitral regurgitation,mild AS,CKD stage IV, anemia,type 2 diabetes mellitus,hypertension, hyperlipidemia, hypothyroidism, breast CA. She was seen by Dr. Agustin Cree via telemedicine in 03/2019 who discussed options for surgery for her mitral valve disease including open heart surgery vs MitraClip. She had recently been diagnosed with breast CA so wanted to postpone any intervention until breast cancer plan. Per oncology she has wished to defer  surgery and utilize anastrazole instead. She has also recently been followed for anemia felt due to renal disease. She presented to [facility] with increasing SOB and edema. She was found to have elevated BNP, left sided pleural effusion and elevated troponins.  Assessment & Plan    1. Acute on chronic diastolic CHF with left pleural effusion: -Continues to have significant evidence of fluid volume overload on exam  -Will increase Lasix today given stable BP and 3+ BLE on exam  -Weight, 174lb today>>only down 1lb since admission  -I&O, net negative 293ml>>appear to not be very accurate  -Denies SOB, lungs diminished in LLL   2. Severe mitral regurgitation with mild aortic stenosis: -Repeat echocardiogram 05/25/19 with moderate to severe MR, mild to moderate AI -Plan is to consider TEE with structural team cardiologist after diuresis is optimized for evaluation of mitral valve regurgitation with potential MitraClip   3. Elevated troponin with known CAD: -Initial elevation felt to be secondary to demand ischemia from elevated LV filling pressures however cannot exclude underlying ACS given degree of elevation>>trend is however flat ([id number]) -Continue ASA, BB, statin and Imdur -On Eliquis, not on heparin gtt  -No chest pain   3. Paroxysmal atrial fibrillation with episodes  of PACs/PVCs: -Carvedilol held yesterday in the setting of SB>>continues to have some episodes of ST -Continue to hold carvedilol dosing for now and trend>>to discuss with MD   4, CKD Stage IV: -Creatinine today, 2.22 up from 2.04 yesterday  -Will increase Lasix in the setting of significant fluid volume overload. If creatinine continues to rise, will back down on dosing  -BMET in AM    Other hospital issues per primary team include: -Anemia, breast cancer  Signed, Kathyrn Drown NP-C [place] Pager: [phone] 05/26/2019, 10:04 AM     For questions or updates, please contact   Please consult [email or web address] for contact info under Cardiology/STEMI.  Patient seen and examined with Kathyrn Drown NP-C.  Agree as above, with the following exceptions and changes as noted below.  Discussed her care in detail with her daughter and husband present in the room.  All questions answered to the best my ability. Gen: NAD, CV: RRR, loud 3/6 holosystolic murmur across the precordium, probable concomitant 2/6 systolic ejection quality murmur heard better at the upper portion of the right and left sternal border, though somewhat obscured by the holosystolic murmur lungs: Bibasilar crackles, Abd: soft, Extrem: Warm, well perfused, 3+ bilateral edema to the knee, Neuro/Psych: alert and oriented x 3, normal mood and affect. All available labs, radiology testing, previous records reviewed.  We discussed continued diuresis with a relative Lasix challenge today.  We will watch her renal function closely.  We also discussed after diuresis reevaluating the mitral regurgitation for severity and any further work-up.  If she feels well, continued work-up of mitral valve can be done as an outpatient with a structural consult.  [name], MD 05/26/19 11:49 AM

## 2019-05-26 NOTE — Plan of Care (Signed)
  Problem: Education: Goal: Knowledge of General Education information will improve Description: Including pain rating scale, medication(s)/side effects and non-pharmacologic comfort measures Outcome: Progressing   Problem: Education: Goal: Ability to demonstrate management of disease process will improve Outcome: Progressing Goal: Ability to verbalize understanding of medication therapies will improve Outcome: Progressing Goal: Individualized Educational Video(s) Outcome: Progressing   Problem: Activity: Goal: Capacity to carry out activities will improve Outcome: Progressing   Problem: Cardiac: Goal: Ability to achieve and maintain adequate cardiopulmonary perfusion will improve Outcome: Progressing   Problem: Education: Goal: Knowledge of General Education information will improve Description: Including pain rating scale, medication(s)/side effects and non-pharmacologic comfort measures Outcome: Progressing   Problem: Health Behavior/Discharge Planning: Goal: Ability to manage health-related needs will improve Outcome: Progressing   Problem: Clinical Measurements: Goal: Ability to maintain clinical measurements within normal limits will improve Outcome: Progressing Goal: Will remain free from infection Outcome: Progressing Goal: Diagnostic test results will improve Outcome: Progressing Goal: Respiratory complications will improve Outcome: Progressing Goal: Cardiovascular complication will be avoided Outcome: Progressing   Problem: Activity: Goal: Risk for activity intolerance will decrease Outcome: Progressing   Problem: Nutrition: Goal: Adequate nutrition will be maintained Outcome: Progressing   Problem: Coping: Goal: Level of anxiety will decrease Outcome: Progressing   Problem: Elimination: Goal: Will not experience complications related to bowel motility Outcome: Progressing Goal: Will not experience complications related to urinary retention Outcome:  Progressing   Problem: Pain Managment: Goal: General experience of comfort will improve Outcome: Progressing   Problem: Safety: Goal: Ability to remain free from injury will improve Outcome: Progressing   Problem: Skin Integrity: Goal: Risk for impaired skin integrity will decrease Outcome: Progressing

## 2019-05-27 ENCOUNTER — Inpatient Hospital Stay: Payer: Medicare Other

## 2019-05-27 ENCOUNTER — Inpatient Hospital Stay (HOSPITAL_COMMUNITY): Payer: Medicare Other

## 2019-05-27 HISTORY — PX: IR THORACENTESIS ASP PLEURAL SPACE W/IMG GUIDE: IMG5380

## 2019-05-27 LAB — CBC WITH DIFFERENTIAL/PLATELET
Abs Immature Granulocytes: 0.02 10*3/uL (ref 0.00–0.07)
Basophils Absolute: 0 10*3/uL (ref 0.0–0.1)
Basophils Relative: 1 %
Eosinophils Absolute: 0.2 10*3/uL (ref 0.0–0.5)
Eosinophils Relative: 3 %
HCT: 27.2 % — ABNORMAL LOW (ref 36.0–46.0)
Hemoglobin: 8.6 g/dL — ABNORMAL LOW (ref 12.0–15.0)
Immature Granulocytes: 0 %
Lymphocytes Relative: 25 %
Lymphs Abs: 1.6 10*3/uL (ref 0.7–4.0)
MCH: 30.3 pg (ref 26.0–34.0)
MCHC: 31.6 g/dL (ref 30.0–36.0)
MCV: 95.8 fL (ref 80.0–100.0)
Monocytes Absolute: 0.7 10*3/uL (ref 0.1–1.0)
Monocytes Relative: 11 %
Neutro Abs: 3.9 10*3/uL (ref 1.7–7.7)
Neutrophils Relative %: 60 %
Platelets: 250 10*3/uL (ref 150–400)
RBC: 2.84 MIL/uL — ABNORMAL LOW (ref 3.87–5.11)
RDW: 14 % (ref 11.5–15.5)
WBC: 6.3 10*3/uL (ref 4.0–10.5)
nRBC: 0 % (ref 0.0–0.2)

## 2019-05-27 LAB — BASIC METABOLIC PANEL
Anion gap: 11 (ref 5–15)
BUN: 55 mg/dL — ABNORMAL HIGH (ref 8–23)
CO2: 29 mmol/L (ref 22–32)
Calcium: 8.7 mg/dL — ABNORMAL LOW (ref 8.9–10.3)
Chloride: 100 mmol/L (ref 98–111)
Creatinine, Ser: 2.22 mg/dL — ABNORMAL HIGH (ref 0.44–1.00)
GFR calc Af Amer: 22 mL/min — ABNORMAL LOW (ref >60)
GFR calc non Af Amer: 19 mL/min — ABNORMAL LOW (ref >60)
Glucose, Bld: 123 mg/dL — ABNORMAL HIGH (ref 70–99)
Potassium: 3.8 mmol/L (ref 3.5–5.1)
Sodium: 140 mmol/L (ref 135–145)

## 2019-05-27 LAB — GLUCOSE, CAPILLARY
Glucose-Capillary: 121 mg/dL — ABNORMAL HIGH (ref 70–99)
Glucose-Capillary: 111 mg/dL — ABNORMAL HIGH (ref 70–99)
Glucose-Capillary: 217 mg/dL — ABNORMAL HIGH (ref 70–99)
Glucose-Capillary: 138 mg/dL — ABNORMAL HIGH (ref 70–99)
Glucose-Capillary: 136 mg/dL — ABNORMAL HIGH (ref 70–99)

## 2019-05-27 LAB — TROPONIN I (HIGH SENSITIVITY)
Troponin I (High Sensitivity): 235 ng/L (ref <18)
Troponin I (High Sensitivity): 254 ng/L (ref <18)

## 2019-05-27 MED ORDER — LIDOCAINE HCL 1 % IJ SOLN
INTRAMUSCULAR | Status: DC | PRN
  Administered 2019-05-27: 10 mL

## 2019-05-27 MED ORDER — LIDOCAINE HCL 1 % IJ SOLN
INTRAMUSCULAR | Status: AC
  Filled 2019-05-27: qty 20

## 2019-05-27 NOTE — Progress Notes (Signed)
PT Cancellation Note  Patient Details Name: Lauren Walker MRN: [id number] DOB: 11-27-1929   Cancelled Treatment:    Reason Eval/Treat Not Completed: Other (comment) Pt just returned from thoracentesis.  She declined PT at this time.  Encouraged pt to walk with nursing when able and PT would f/u as able. Maggie Font, PT Acute Rehab Services Pager [phone] [facility] [phone]   Karlton Lemon 05/27/2019, 2:12 PM

## 2019-05-27 NOTE — Procedures (Signed)
PROCEDURE SUMMARY:  Successful US guided therapeutic left thoracentesis. Yielded 550 mL of yellow fluid. Pt tolerated procedure well. No immediate complications.  Specimen was not sent for labs. CXR ordered.  EBL < 5 mL  Docia Barrier PA-C 05/27/2019 1:30 PM

## 2019-05-27 NOTE — Progress Notes (Signed)
PROGRESS NOTE    KENDRA GRISSETT  ZOX:[id number] DOB: Sep 04, 1929 DOA: 05/24/2019 PCP: Midge Minium, MD   Brief Narrative: Patient is 86 year old female with history of severe mitral regurgitation, paroxysmal A. fib, coronary artery disease, CKD stage IV, diabetes type 2, hypothyroidism, breast cancer, chronic anemia who presents to the emergency room with complaint of shortness of breath.  She was also having increased bilateral lower extremity edema over few weeks and became acutely short of breath a day before admission.  On presentation, x-ray of the chest showed moderate left pleural effusion with possible infiltrates likely due to pulmonary edema.  Troponin was elevated.  Elevated BNP.  Cardiology consulted for evaluation of acute congestive heart failure with possible non-STEMI.  She has been started on IV Lasix.  Cardiology planning for TEE, possible  ischemic work-up.  Assessment & Plan:   Principal Problem:   Acute CHF (congestive heart failure) (HCC) Active Problems:   Hypothyroid   HTN (hypertension)   CAD (coronary artery disease)   Mitral regurgitation moderate to severe based on echocardiogram in summer 2020   Malignant neoplasm of upper-outer quadrant of left breast in female, estrogen receptor positive ([place])   CKD stage G4/A1, GFR 15-29 and albumin creatinine ratio <30 mg/g (HCC)   Anemia associated with stage 4 chronic renal failure (HCC)   Non-ST elevation MI (NSTEMI) (HCC)   Acute systolic CHF: Presented with volume overload, bilateral lower extremity edema, shortness of breath, chest pain.  Echocardiogram done here showed ejection fraction of 40 to 45%, regional wall motion abnormality in the left ventricle, moderate LVH. Currently on IV Lasix 40 mg IV twice daily.  Cardiology following.  Monitor input/output, daily weight.  Moderate/severe mitral regurgitation: As per echo.  .  Cardiology planning for TEE.  Chest pain/elevated troponin/history of coronary  artery disease: EKG did not show any ischemic changes.  Elevated troponin could be associated with congestive heart failure but echo has shown regional wall motion abnormality in the left ventricle.   On aspirin at home.  Left-sided pleural effusion :she is complaining of lower chest tightness on both sides today.  Chest x-ray had shown moderate left-sided pleural effusion.  I have requested for US thoracentesis.  CKD stage IIIb: Baseline creatinine ranges from 2-2.5.  Currently kidney function at baseline.  Monitor closely.  Hypertension: Currently blood pressure stable.  Continue current medications.  She is on hydralazine, Imdur, carvedilol.  Paroxysmal A. fib: On Eliquis for anticoagulation.  Currently rate is controlled.  On carvedilol.  Left breast cancer: Estrogen receptor positive.  Follows with Dr. Jana Hakim.  On anastrozole.  Hyperlipidemia: On Lipitor  Hypothyroidism: On Synthyroid  Normocytic anemia: Most likely associated with CKD.  Currently hemoglobin stable.                 DVT prophylaxis:Eliquis Code Status: DNR Family Communication: Discussed with husband at bedside on  05/27/2019 Disposition Plan: Patient is from home.  Needs cardiology clearance before discharge.  Cardiology planning for TEE/possible ischemic work-up.   Consultants: Cardiology  Procedures: None  Antimicrobials:  Anti-infectives (From admission, onward)   None      Subjective: Patient seen and examined at the bedside this morning.  Sitting in the chair.  She is uncomfortable today because she was experiencing lower chest /upper abdominal tightness which extends to both sides.  Lungs were clear to auscultation.  Lower extremity edema has improved.  Objective: Vitals:   05/26/19 2100 05/27/19 0104 05/27/19 0413 05/27/19 0755  BP:  Marland Kitchen)  136/49 (!) 105/38 (!) 156/47  Pulse:  72 70 73  Resp:  18 19 17   Temp:  98.1 F (36.7 C) 98.6 F (37 C) 97.7 F (36.5 C)  TempSrc:  Oral Oral  Oral  SpO2: 98% 100% 98% 97%  Weight:  78.7 kg    Height:        Intake/Output Summary (Last 24 hours) at 05/27/2019 1146 Last data filed at 05/27/2019 0905 Gross per 24 hour  Intake 840 ml  Output 1900 ml  Net -1060 ml   Filed Weights   05/25/19 1451 05/26/19 0500 05/27/19 0104  Weight: 79.2 kg 79.2 kg 78.7 kg    Examination:   General exam: Not in apparent distress, slightly uncomfortable due to lower chest discomfort. Respiratory system: Bilateral equal air entry, normal vesicular breath sounds, no wheezes or crackles  Cardiovascular system: S1 & S2 heard, RRR. No JVD, murmurs, rubs, gallops or clicks. Gastrointestinal system: Abdomen is nondistended, soft and nontender. No organomegaly or masses felt. Normal bowel sounds heard. Central nervous system: Alert and oriented. No focal neurological deficits. Extremities: No edema, no clubbing ,no cyanosis Skin: No rashes, lesions or ulcers,no icterus ,no pallor    Data Reviewed: I have personally reviewed following labs and imaging studies  CBC: Recent Labs  Lab 05/24/19 2036 05/25/19 0617 05/27/19 0421  WBC 7.2 7.5 6.3  NEUTROABS  --  5.4 3.9  HGB 9.5* 9.4* 8.6*  HCT 31.1* 30.0* 27.2*  MCV 98.4 98.0 95.8  PLT 267 233 425   Basic Metabolic Panel: Recent Labs  Lab 05/24/19 2036 05/25/19 0617 05/26/19 0457 05/27/19 0421  NA 138 141 140 140  K 4.0 4.0 4.0 3.8  CL 106 103 102 100  CO2 26 26 28 29   GLUCOSE 171* 130* 112* 123*  BUN 53* 51* 54* 55*  CREATININE 2.08* 2.04* 2.22* 2.22*  CALCIUM 8.7* 8.7* 8.5* 8.7*   GFR: Estimated Creatinine Clearance: 17.8 mL/min (A) (by C-G formula based on SCr of 2.22 mg/dL (H)). Liver Function Tests: Recent Labs  Lab 05/25/19 0617  AST 27  ALT 14  ALKPHOS 18*  BILITOT 0.5  PROT 5.5*  ALBUMIN 3.1*   No results for input(s): LIPASE, AMYLASE in the last 168 hours. No results for input(s): AMMONIA in the last 168 hours. Coagulation Profile: No results for input(s): INR,  PROTIME in the last 168 hours. Cardiac Enzymes: No results for input(s): CKTOTAL, CKMB, CKMBINDEX, TROPONINI in the last 168 hours. BNP (last 3 results) No results for input(s): PROBNP in the last 8760 hours. HbA1C: Recent Labs    05/25/19 0617  HGBA1C 5.3   CBG: Recent Labs  Lab 05/26/19 0529 05/26/19 1158 05/26/19 1633 05/26/19 2119 05/27/19 0607  GLUCAP 123* 126* 182* 121* 111*   Lipid Profile: No results for input(s): CHOL, HDL, LDLCALC, TRIG, CHOLHDL, LDLDIRECT in the last 72 hours. Thyroid Function Tests: Recent Labs    05/25/19 0617  TSH 1.130   Anemia Panel: No results for input(s): VITAMINB12, FOLATE, FERRITIN, TIBC, IRON, RETICCTPCT in the last 72 hours. Sepsis Labs: No results for input(s): PROCALCITON, LATICACIDVEN in the last 168 hours.  Recent Results (from the past 240 hour(s))  SARS CORONAVIRUS 2 (TAT 6-24 HRS) Nasopharyngeal Nasopharyngeal Swab     Status: None   Collection Time: 05/25/19  1:35 AM   Specimen: Nasopharyngeal Swab  Result Value Ref Range Status   SARS Coronavirus 2 NEGATIVE NEGATIVE Final    Comment: (NOTE) SARS-CoV-2 target nucleic acids are NOT DETECTED. The  SARS-CoV-2 RNA is generally detectable in upper and lower respiratory specimens during the acute phase of infection. Negative results do not preclude SARS-CoV-2 infection, do not rule out co-infections with other pathogens, and should not be used as the sole basis for treatment or other patient management decisions. Negative results must be combined with clinical observations, patient history, and epidemiological information. The expected result is Negative. Fact Sheet for Patients: SugarRoll.be Fact Sheet for Healthcare Providers: https://www.woods-mathews.com/ This test is not yet approved or cleared by the [place] FDA and  has been authorized for detection and/or diagnosis of SARS-CoV-2 by FDA under an Emergency Use  Authorization (EUA). This EUA will remain  in effect (meaning this test can be used) for the duration of the COVID-19 declaration under Section 56 4(b)(1) of the Act, 21 U.S.C. section 360bbb-3(b)(1), unless the authorization is terminated or revoked sooner. Performed at [facility], [place] [address], [place], [place] [address]          Radiology Studies: ECHOCARDIOGRAM COMPLETE  Result Date: 05/25/2019    ECHOCARDIOGRAM REPORT   Patient Name:   Frutoso Schatz Date of Exam: 05/25/2019 Medical Rec #:  [id number]      Height:       64.0 in Accession #:    [id number]     Weight:       175.4 lb Date of Birth:  Apr 19, 1929      BSA:          1.850 m Patient Age:    25 years       BP:           165/58 mmHg Patient Gender: F              HR:           59 bpm. Exam Location:  Inpatient Procedure: 2D Echo Indications:    Congestive Heart Failure I50.31  History:        Patient has prior history of Echocardiogram examinations, most                 recent 02/16/2019.  Sonographer:    Mikki Santee RDCS (AE) Referring Phys: [id number] Zakya Halabi IMPRESSIONS  1. Left ventricular ejection fraction, by estimation, is 45 to 50%. The left ventricle has mildly decreased function. The left ventricle demonstrates regional wall motion abnormalities (see scoring diagram/findings for description). There is moderate left ventricular hypertrophy. Left ventricular diastolic parameters are indeterminate. There is moderate hypokinesis of the left ventricular, entire inferior wall.  2. Right ventricular systolic function is normal. The right ventricular size is normal. There is normal pulmonary artery systolic pressure.  3. Left atrial size was severely dilated.  4. Large pleural effusion in the left lateral region.  5. The mitral valve is grossly normal. Moderate to severe mitral valve regurgitation.  6. The aortic valve is tricuspid. Aortic valve regurgitation is not visualized. Mild to moderate aortic valve stenosis.  Aortic valve area, by VTI measures 1.49 cm. Aortic valve mean gradient measures 10.4 mmHg. Aortic valve Vmax measures 2.17 m/s.  7. The inferior vena cava is dilated in size with >50% respiratory variability, suggesting right atrial pressure of 8 mmHg. Comparison(s): 02/16/19: LVEF 60-65%, severe MR, severe LAE. FINDINGS  Left Ventricle: Left ventricular ejection fraction, by estimation, is 45 to 50%. The left ventricle has mildly decreased function. The left ventricle demonstrates regional wall motion abnormalities. Moderate hypokinesis of the left ventricular, entire inferior wall. The left ventricular internal cavity size was  normal in size. There is moderate left ventricular hypertrophy. Left ventricular diastolic parameters are indeterminate. Right Ventricle: The right ventricular size is normal. No increase in right ventricular wall thickness. Right ventricular systolic function is normal. There is normal pulmonary artery systolic pressure. The tricuspid regurgitant velocity is 2.29 m/s, and  with an assumed right atrial pressure of 8 mmHg, the estimated right ventricular systolic pressure is 63.0 mmHg. Left Atrium: Left atrial size was severely dilated. Right Atrium: Right atrial size was normal in size. Pericardium: There is no evidence of pericardial effusion. Mitral Valve: The mitral valve is grossly normal. Moderate to severe mitral valve regurgitation, with posteriorly-directed jet. Tricuspid Valve: The tricuspid valve is grossly normal. Tricuspid valve regurgitation is trivial. Aortic Valve: The aortic valve is tricuspid. Aortic valve regurgitation is not visualized. Mild to moderate aortic stenosis is present. Aortic valve mean gradient measures 10.4 mmHg. Aortic valve peak gradient measures 18.8 mmHg. Aortic valve area, by VTI measures 1.49 cm. Pulmonic Valve: The pulmonic valve was grossly normal. Pulmonic valve regurgitation is trivial. Aorta: The aortic root and ascending aorta are structurally  normal, with no evidence of dilitation. Venous: The inferior vena cava is dilated in size with greater than 50% respiratory variability, suggesting right atrial pressure of 8 mmHg. IAS/Shunts: No atrial level shunt detected by color flow Doppler. Additional Comments: There is a large pleural effusion in the left lateral region.  LEFT VENTRICLE PLAX 2D LVIDd:         4.60 cm  Diastology LVIDs:         3.80 cm  LV e' lateral:   6.97 cm/s LV PW:         1.30 cm  LV E/e' lateral: 12.6 LV IVS:        1.30 cm  LV e' medial:    4.79 cm/s LVOT diam:     2.30 cm  LV E/e' medial:  18.3 LV SV:         85 LV SV Index:   46 LVOT Area:     4.15 cm  RIGHT VENTRICLE RV S prime:     14.50 cm/s TAPSE (M-mode): 1.8 cm LEFT ATRIUM              Index       RIGHT ATRIUM           Index LA diam:        4.00 cm  2.16 cm/m  RA Area:     17.60 cm LA Vol (A2C):   114.0 ml 61.61 ml/m RA Volume:   40.30 ml  21.78 ml/m LA Vol (A4C):   67.8 ml  36.64 ml/m LA Biplane Vol: 94.1 ml  50.86 ml/m  AORTIC VALVE AV Area (Vmax):    1.36 cm AV Area (Vmean):   1.33 cm AV Area (VTI):     1.49 cm AV Vmax:           216.80 cm/s AV Vmean:          151.600 cm/s AV VTI:            0.571 m AV Peak Grad:      18.8 mmHg AV Mean Grad:      10.4 mmHg LVOT Vmax:         70.80 cm/s LVOT Vmean:        48.400 cm/s LVOT VTI:          0.204 m LVOT/AV VTI ratio: 0.36  AORTA Ao Root diam: 3.20 cm  MITRAL VALVE               TRICUSPID VALVE MV Area (PHT): 3.65 cm    TR Peak grad:   21.0 mmHg MV Decel Time: 208 msec    TR Vmax:        229.00 cm/s MR Peak grad: 103.2 mmHg MR Vmax:      508.00 cm/s  SHUNTS MV E velocity: 87.50 cm/s  Systemic VTI:  0.20 m MV A velocity: 33.40 cm/s  Systemic Diam: 2.30 cm MV E/A ratio:  2.62 Lyman Bishop MD Electronically signed by Lyman Bishop MD Signature Date/Time: 05/25/2019/4:51:06 PM    Final         Scheduled Meds: . anastrozole  1 mg Oral Daily  . apixaban  2.5 mg Oral BID  . vitamin C  500 mg Oral Daily  . aspirin EC   81 mg Oral Daily  . atorvastatin  40 mg Oral q1800  . fenofibrate  160 mg Oral Daily  . ferrous sulfate  325 mg Oral Daily  . furosemide  40 mg Intravenous Q12H  . hydrALAZINE  50 mg Oral TID  . insulin aspart  0-9 Units Subcutaneous TID WC  . isosorbide mononitrate  30 mg Oral Daily  . levothyroxine  150 mcg Oral QAC breakfast  . magnesium oxide  400 mg Oral Daily   Continuous Infusions:   LOS: 2 days    Time spent:35 mins. More than 50% of that time was spent in counseling and/or coordination of care.      Shelly Coss, MD [facility] P4/10/2019, 11:46 AM

## 2019-05-27 NOTE — Progress Notes (Addendum)
Progress Note  Patient Name: Frutoso Schatz Date of Encounter: 05/27/2019  Primary Cardiologist: Jenne Campus, MD  Subjective   Reports feeling of a "bandlike sensation" under her bilateral breast area. NO associated symptoms.  Fluid status appears more stable today.  Creatinine stable.  Inpatient Medications    Scheduled Meds: . anastrozole  1 mg Oral Daily  . apixaban  2.5 mg Oral BID  . vitamin C  500 mg Oral Daily  . aspirin EC  81 mg Oral Daily  . atorvastatin  40 mg Oral q1800  . fenofibrate  160 mg Oral Daily  . ferrous sulfate  325 mg Oral Daily  . furosemide  40 mg Intravenous Q12H  . hydrALAZINE  50 mg Oral TID  . insulin aspart  0-9 Units Subcutaneous TID WC  . isosorbide mononitrate  30 mg Oral Daily  . levothyroxine  150 mcg Oral QAC breakfast  . magnesium oxide  400 mg Oral Daily   Continuous Infusions:  PRN Meds: acetaminophen **OR** acetaminophen, nitroGLYCERIN, ondansetron **OR** ondansetron (ZOFRAN) IV   Vital Signs    Vitals:   05/26/19 2100 05/27/19 0104 05/27/19 0413 05/27/19 0755  BP:  (!) 136/49 (!) 105/38 (!) 156/47  Pulse:  72 70 73  Resp:  18 19 17   Temp:  98.1 F (36.7 C) 98.6 F (37 C) 97.7 F (36.5 C)  TempSrc:  Oral Oral Oral  SpO2: 98% 100% 98% 97%  Weight:  78.7 kg    Height:        Intake/Output Summary (Last 24 hours) at 05/27/2019 0912 Last data filed at 05/27/2019 0905 Gross per 24 hour  Intake 960 ml  Output 1900 ml  Net -940 ml   Filed Weights   05/25/19 1451 05/26/19 0500 05/27/19 0104  Weight: 79.2 kg 79.2 kg 78.7 kg    Physical Exam   General: Elderly, NAD Neck: Negative for carotid bruits. No JVD Lungs:Clear to ausculation bilaterally. No wheezes, rales, or rhonchi. Breathing is unlabored. Cardiovascular: RRR with S1 S2. + murmurs Abdomen: Soft, non-tender, non-distended. No obvious abdominal masses. Extremities: 2-3+ BLE edema. Radial pulses 2+ bilaterally Neuro: Alert and oriented. No focal  deficits. No facial asymmetry. MAE spontaneously. Psych: Responds to questions appropriately with normal affect.    Labs    Chemistry Recent Labs  Lab 05/25/19 0617 05/26/19 0457 05/27/19 0421  NA 141 140 140  K 4.0 4.0 3.8  CL 103 102 100  CO2 26 28 29   GLUCOSE 130* 112* 123*  BUN 51* 54* 55*  CREATININE 2.04* 2.22* 2.22*  CALCIUM 8.7* 8.5* 8.7*  PROT 5.5*  --   --   ALBUMIN 3.1*  --   --   AST 27  --   --   ALT 14  --   --   ALKPHOS 18*  --   --   BILITOT 0.5  --   --   GFRNONAA 21* 19* 19*  GFRAA 25* 22* 22*  ANIONGAP 12 10 11      Hematology Recent Labs  Lab 05/24/19 2036 05/25/19 0617 05/27/19 0421  WBC 7.2 7.5 6.3  RBC 3.16* 3.06* 2.84*  HGB 9.5* 9.4* 8.6*  HCT 31.1* 30.0* 27.2*  MCV 98.4 98.0 95.8  MCH 30.1 30.7 30.3  MCHC 30.5 31.3 31.6  RDW 14.0 14.1 14.0  PLT 267 233 250    Cardiac EnzymesNo results for input(s): TROPONINI in the last 168 hours. No results for input(s): TROPIPOC in the last 168 hours.  BNP Recent Labs  Lab 05/25/19 0135  BNP 2,812.1*     DDimer No results for input(s): DDIMER in the last 168 hours.   Radiology    ECHOCARDIOGRAM COMPLETE  Result Date: 05/25/2019    ECHOCARDIOGRAM REPORT   Patient Name:   Frutoso Schatz Date of Exam: 05/25/2019 Medical Rec #:  [id number]      Height:       64.0 in Accession #:    [id number]     Weight:       175.4 lb Date of Birth:  [birth year]      BSA:          1.850 m Patient Age:    8 years       BP:           165/58 mmHg Patient Gender: F              HR:           59 bpm. Exam Location:  Inpatient Procedure: 2D Echo Indications:    Congestive Heart Failure I50.31  History:        Patient has prior history of Echocardiogram examinations, most                 recent 02/16/2019.  Sonographer:    Mikki Santee RDCS (AE) Referring Phys: [id number] AMRIT ADHIKARI IMPRESSIONS  1. Left ventricular ejection fraction, by estimation, is 45 to 50%. The left ventricle has mildly decreased function. The left  ventricle demonstrates regional wall motion abnormalities (see scoring diagram/findings for description). There is moderate left ventricular hypertrophy. Left ventricular diastolic parameters are indeterminate. There is moderate hypokinesis of the left ventricular, entire inferior wall.  2. Right ventricular systolic function is normal. The right ventricular size is normal. There is normal pulmonary artery systolic pressure.  3. Left atrial size was severely dilated.  4. Large pleural effusion in the left lateral region.  5. The mitral valve is grossly normal. Moderate to severe mitral valve regurgitation.  6. The aortic valve is tricuspid. Aortic valve regurgitation is not visualized. Mild to moderate aortic valve stenosis. Aortic valve area, by VTI measures 1.49 cm. Aortic valve mean gradient measures 10.4 mmHg. Aortic valve Vmax measures 2.17 m/s.  7. The inferior vena cava is dilated in size with >50% respiratory variability, suggesting right atrial pressure of 8 mmHg. Comparison(s): 02/16/19: LVEF 60-65%, severe MR, severe LAE. FINDINGS  Left Ventricle: Left ventricular ejection fraction, by estimation, is 45 to 50%. The left ventricle has mildly decreased function. The left ventricle demonstrates regional wall motion abnormalities. Moderate hypokinesis of the left ventricular, entire inferior wall. The left ventricular internal cavity size was normal in size. There is moderate left ventricular hypertrophy. Left ventricular diastolic parameters are indeterminate. Right Ventricle: The right ventricular size is normal. No increase in right ventricular wall thickness. Right ventricular systolic function is normal. There is normal pulmonary artery systolic pressure. The tricuspid regurgitant velocity is 2.29 m/s, and  with an assumed right atrial pressure of 8 mmHg, the estimated right ventricular systolic pressure is 41.6 mmHg. Left Atrium: Left atrial size was severely dilated. Right Atrium: Right atrial size  was normal in size. Pericardium: There is no evidence of pericardial effusion. Mitral Valve: The mitral valve is grossly normal. Moderate to severe mitral valve regurgitation, with posteriorly-directed jet. Tricuspid Valve: The tricuspid valve is grossly normal. Tricuspid valve regurgitation is trivial. Aortic Valve: The aortic valve is tricuspid. Aortic valve regurgitation is not visualized. Mild to moderate  aortic stenosis is present. Aortic valve mean gradient measures 10.4 mmHg. Aortic valve peak gradient measures 18.8 mmHg. Aortic valve area, by VTI measures 1.49 cm. Pulmonic Valve: The pulmonic valve was grossly normal. Pulmonic valve regurgitation is trivial. Aorta: The aortic root and ascending aorta are structurally normal, with no evidence of dilitation. Venous: The inferior vena cava is dilated in size with greater than 50% respiratory variability, suggesting right atrial pressure of 8 mmHg. IAS/Shunts: No atrial level shunt detected by color flow Doppler. Additional Comments: There is a large pleural effusion in the left lateral region.  LEFT VENTRICLE PLAX 2D LVIDd:         4.60 cm  Diastology LVIDs:         3.80 cm  LV e' lateral:   6.97 cm/s LV PW:         1.30 cm  LV E/e' lateral: 12.6 LV IVS:        1.30 cm  LV e' medial:    4.79 cm/s LVOT diam:     2.30 cm  LV E/e' medial:  18.3 LV SV:         85 LV SV Index:   46 LVOT Area:     4.15 cm  RIGHT VENTRICLE RV S prime:     14.50 cm/s TAPSE (M-mode): 1.8 cm LEFT ATRIUM              Index       RIGHT ATRIUM           Index LA diam:        4.00 cm  2.16 cm/m  RA Area:     17.60 cm LA Vol (A2C):   114.0 ml 61.61 ml/m RA Volume:   40.30 ml  21.78 ml/m LA Vol (A4C):   67.8 ml  36.64 ml/m LA Biplane Vol: 94.1 ml  50.86 ml/m  AORTIC VALVE AV Area (Vmax):    1.36 cm AV Area (Vmean):   1.33 cm AV Area (VTI):     1.49 cm AV Vmax:           216.80 cm/s AV Vmean:          151.600 cm/s AV VTI:            0.571 m AV Peak Grad:      18.8 mmHg AV Mean Grad:       10.4 mmHg LVOT Vmax:         70.80 cm/s LVOT Vmean:        48.400 cm/s LVOT VTI:          0.204 m LVOT/AV VTI ratio: 0.36  AORTA Ao Root diam: 3.20 cm MITRAL VALVE               TRICUSPID VALVE MV Area (PHT): 3.65 cm    TR Peak grad:   21.0 mmHg MV Decel Time: 208 msec    TR Vmax:        229.00 cm/s MR Peak grad: 103.2 mmHg MR Vmax:      508.00 cm/s  SHUNTS MV E velocity: 87.50 cm/s  Systemic VTI:  0.20 m MV A velocity: 33.40 cm/s  Systemic Diam: 2.30 cm MV E/A ratio:  2.62 Lyman Bishop MD Electronically signed by Lyman Bishop MD Signature Date/Time: 05/25/2019/4:51:06 PM    Final    Telemetry    NSR- Personally Reviewed  ECG    No new tracing as of 05/27/2019- Personally Reviewed  Cardiac Studies   Echo 05/25/19:  1. Left ventricular ejection fraction, by estimation, is 45 to 50%. The  left ventricle has mildly decreased function. The left ventricle  demonstrates regional wall motion abnormalities (see scoring  diagram/findings for description). There is moderate  left ventricular hypertrophy. Left ventricular diastolic parameters are  indeterminate. There is moderate hypokinesis of the left ventricular,  entire inferior wall.  2. Right ventricular systolic function is normal. The right ventricular  size is normal. There is normal pulmonary artery systolic pressure.  3. Left atrial size was severely dilated.  4. Large pleural effusion in the left lateral region.  5. The mitral valve is grossly normal. Moderate to severe mitral valve  regurgitation.  6. The aortic valve is tricuspid. Aortic valve regurgitation is not  visualized. Mild to moderate aortic valve stenosis. Aortic valve area, by  VTI measures 1.49 cm. Aortic valve mean gradient measures 10.4 mmHg.  Aortic valve Vmax measures 2.17 m/s.  7. The inferior vena cava is dilated in size with >50% respiratory  variability, suggesting right atrial pressure of 8 mmHg.   Comparison(s): 02/16/19: LVEF 60-65%, severe MR,  severe LAE.   Echo 02/16/2019:  1. Left ventricular ejection fraction, by visual estimation, is 60 to  65%. is mildly increased left ventricular hypertrophy.  2. Left ventricular diastolic parameters are indeterminate.  3. Mildly dilated left ventricular internal cavity size.  4. Global right ventricle has normal systolic function.The right  ventricular size is normal. No increase in right ventricular wall  thickness.  5. Left atrial size was severely dilated.  6. Right atrial size was normal.  7. Mild mitral valve prolapse.  8. Severe mitral valve regurgitation. No evidence of mitral stenosis.  9. The tricuspid valve is normal in structure.  10. The aortic valve is normal in structure. Aortic valve regurgitation is  not visualized. Mild aortic valve stenosis.  11. The pulmonic valve was normal in structure. Pulmonic valve  regurgitation is not visualized.  12. There is dilatation of the ascending aorta measuring 38 mm.  13. Mildly elevated pulmonary artery systolic pressure.  14. The inferior vena cava is normal in size with greater than 50%  respiratory variability, suggesting right atrial pressure of 3 mmHg.  15. Severe MR with jet directed posteriorly. Minimal prolaps of the middle  segment of the anterior leaflet of the mitral valve noted A2.   Patient Profile     86 y.o. female with history of CAD (cath 2016 elsewhere, no report available, was told 1 occluded artery with collateral managed medically),paroxysmal atrial fibrillation,chronic diastolic CHF,severe mitral regurgitation,mild AS,CKDstage IV,anemia,type 2 diabetes mellitus,hypertension, hyperlipidemia, hypothyroidism, breast CA. She was seen by Dr. Agustin Cree via telemedicine in 03/2019 who discussed options for surgery for her mitral valve disease including open heart surgery vs MitraClip. She had recently been diagnosed with breast CA so wanted to postpone any intervention until breast cancer plan. Per  oncology she has wished to defer surgery and utilize anastrazole instead. She has also recently been followed for anemia felt due to renal disease. She presented to [facility] with increasing SOB and edema. She was found to have elevated BNP, left sided pleural effusion and elevated troponins.  Assessment & Plan    1. Acute on chronic diastolic CHF with left pleural effusion: -Continues to have significant evidence of fluid volume overload on exam  -Continue with IV Lasix at current dose given stable BP and 3+ BLE on exam  -Weight, 173lb today>>only down 1lb since yesterday -I&O, net negative 1.2L -Denies SOB  2. Severe  mitral regurgitation with mild aortic stenosis: -Repeat echocardiogram 05/25/19 with moderate to severe MR, mild to moderate AI  3. Elevated troponin with known CAD: -Initial elevation felt to be secondary to demand ischemia from elevated LV filling pressures however cannot exclude underlying ACS given degree of elevation>>trend is however flat ([id number]) -Does have reports of a bandlike sensation around her bilateral lower breast which she reports was initially present upon presentation however improved and now has returned -We will obtain EKG, HST -May benefit from further ischemic work-up while inpatient -Continue ASA, BB, statin and Imdur -On Eliquis, not on heparin gtt   3. Paroxysmal atrial fibrillation with episodes of PACs/PVCs: -Carvedilol held yesterday in the setting of SB>>continues to have some episodes of ST -Continue to hold carvedilol dosing for now and trend>>to discuss with MD   4, CKD Stage IV: -Creatinine today, 2.22 stable from yesterday 05/26/2019 with creatinine of 2.22 -Continue current dose of her Lasix -BMET in AM   Signed, Kathyrn Drown NP-C [place] Pager: [phone] 05/27/2019, 9:12 AM     For questions or updates, please contact   Please consult [email or web address] for contact info under Cardiology/STEMI.  Patient seen and examined  with JMcDaniel NPC.  Agree as above, with the following exceptions and changes as noted below. Feels better today. Gen: NAD, CV: RRR, holosystolic murmur across precordium, Lungs: clear, Abd: soft, Extrem: Warm, well perfused, 3+ edema, Neuro/Psych: alert and oriented x 3, normal mood and affect. All available labs, radiology testing, previous records reviewed. Continues to diurese appropriately. Ted hose needed ongoing, chronic LE edema. Will reassess need for ischemic workup and mitral valve evaluation with patient over weekend when patient is better diuresed. Discussed in detail with patient and husband today.   [name], MD

## 2019-05-28 ENCOUNTER — Encounter (HOSPITAL_COMMUNITY): Payer: Self-pay | Admitting: Internal Medicine

## 2019-05-28 DIAGNOSIS — I495 Sick sinus syndrome: Secondary | ICD-10-CM

## 2019-05-28 DIAGNOSIS — I480 Paroxysmal atrial fibrillation: Secondary | ICD-10-CM

## 2019-05-28 LAB — GLUCOSE, CAPILLARY
Glucose-Capillary: 136 mg/dL — ABNORMAL HIGH (ref 70–99)
Glucose-Capillary: 105 mg/dL — ABNORMAL HIGH (ref 70–99)
Glucose-Capillary: 138 mg/dL — ABNORMAL HIGH (ref 70–99)
Glucose-Capillary: 114 mg/dL — ABNORMAL HIGH (ref 70–99)

## 2019-05-28 LAB — BASIC METABOLIC PANEL
Anion gap: 14 (ref 5–15)
BUN: 60 mg/dL — ABNORMAL HIGH (ref 8–23)
CO2: 29 mmol/L (ref 22–32)
Calcium: 9.2 mg/dL (ref 8.9–10.3)
Chloride: 96 mmol/L — ABNORMAL LOW (ref 98–111)
Creatinine, Ser: 2.37 mg/dL — ABNORMAL HIGH (ref 0.44–1.00)
GFR calc Af Amer: 21 mL/min — ABNORMAL LOW (ref >60)
GFR calc non Af Amer: 18 mL/min — ABNORMAL LOW (ref >60)
Glucose, Bld: 137 mg/dL — ABNORMAL HIGH (ref 70–99)
Potassium: 4 mmol/L (ref 3.5–5.1)
Sodium: 139 mmol/L (ref 135–145)

## 2019-05-28 MED ORDER — HYDRALAZINE HCL 25 MG PO TABS
25.0000 mg | ORAL_TABLET | Freq: Three times a day (TID) | ORAL | Status: DC
  Administered 2019-05-28 – 2019-05-29 (×3): 25 mg via ORAL
  Filled 2019-05-28 (×3): qty 1

## 2019-05-28 MED ORDER — AMIODARONE HCL 200 MG PO TABS
200.0000 mg | ORAL_TABLET | Freq: Two times a day (BID) | ORAL | Status: DC
  Administered 2019-05-28 – 2019-05-31 (×6): 200 mg via ORAL
  Filled 2019-05-28 (×6): qty 1

## 2019-05-28 MED ORDER — METOPROLOL TARTRATE 5 MG/5ML IV SOLN
5.0000 mg | INTRAVENOUS | Status: AC | PRN
  Administered 2019-05-28 (×2): 5 mg via INTRAVENOUS
  Filled 2019-05-28 (×2): qty 5

## 2019-05-28 NOTE — Progress Notes (Signed)
Progress Note  Patient Name: Lauren Walker Date of Encounter: 05/28/2019  Primary Cardiologist: Jenne Campus, MD  Subjective   Rapid atrial fibrillation overnight, received IV metoprolol.  6 second conversion pause this morning at 9:15 am while I was standing in the room. She described it as a feeling "over her whole body, face, feet, legs" could not describe the sensation further.    Inpatient Medications    Scheduled Meds: . anastrozole  1 mg Oral Daily  . apixaban  2.5 mg Oral BID  . vitamin C  500 mg Oral Daily  . aspirin EC  81 mg Oral Daily  . atorvastatin  40 mg Oral q1800  . fenofibrate  160 mg Oral Daily  . ferrous sulfate  325 mg Oral Daily  . furosemide  40 mg Intravenous Q12H  . hydrALAZINE  25 mg Oral TID  . insulin aspart  0-9 Units Subcutaneous TID WC  . isosorbide mononitrate  30 mg Oral Daily  . levothyroxine  150 mcg Oral QAC breakfast  . magnesium oxide  400 mg Oral Daily   Continuous Infusions:  PRN Meds: acetaminophen **OR** acetaminophen, lidocaine, metoprolol tartrate, nitroGLYCERIN, ondansetron **OR** ondansetron (ZOFRAN) IV   Vital Signs    Vitals:   05/27/19 2007 05/28/19 0120 05/28/19 0544 05/28/19 0903  BP: 137/64 (!) 165/50 (!) 94/56 107/65  Pulse: 66 71 88   Resp: 17 19 20    Temp: 98.1 F (36.7 C) 98.1 F (36.7 C) 98.2 F (36.8 C)   TempSrc: Oral Oral Oral   SpO2: 98% 97% 98% 100%  Weight:      Height:        Intake/Output Summary (Last 24 hours) at 05/28/2019 1047 Last data filed at 05/28/2019 0807 Gross per 24 hour  Intake 480 ml  Output 1250 ml  Net -770 ml   Filed Weights   05/25/19 1451 05/26/19 0500 05/27/19 0104  Weight: 79.2 kg 79.2 kg 78.7 kg    Physical Exam   GEN: No acute distress.   Neck: No significant JVD Cardiac: RRR, holosystolic murmur across precordium.  Respiratory: bibasilar faint crackles GI: Soft, nontender, non-distended  MS: dependent edema below the mid shin, 3+; No  deformity. Neuro:  Nonfocal  Psych: Normal affect    Labs    Chemistry Recent Labs  Lab 05/25/19 0617 05/25/19 0617 05/26/19 0457 05/27/19 0421 05/28/19 0424  NA 141   < > 140 140 139  K 4.0   < > 4.0 3.8 4.0  CL 103   < > 102 100 96*  CO2 26   < > 28 29 29   GLUCOSE 130*   < > 112* 123* 137*  BUN 51*   < > 54* 55* 60*  CREATININE 2.04*   < > 2.22* 2.22* 2.37*  CALCIUM 8.7*   < > 8.5* 8.7* 9.2  PROT 5.5*  --   --   --   --   ALBUMIN 3.1*  --   --   --   --   AST 27  --   --   --   --   ALT 14  --   --   --   --   ALKPHOS 18*  --   --   --   --   BILITOT 0.5  --   --   --   --   GFRNONAA 21*   < > 19* 19* 18*  GFRAA 25*   < > 22* 22*  21*  ANIONGAP 12   < > 10 11 14    < > = values in this interval not displayed.     Hematology Recent Labs  Lab 05/24/19 2036 05/25/19 0617 05/27/19 0421  WBC 7.2 7.5 6.3  RBC 3.16* 3.06* 2.84*  HGB 9.5* 9.4* 8.6*  HCT 31.1* 30.0* 27.2*  MCV 98.4 98.0 95.8  MCH 30.1 30.7 30.3  MCHC 30.5 31.3 31.6  RDW 14.0 14.1 14.0  PLT 267 233 250    Cardiac EnzymesNo results for input(s): TROPONINI in the last 168 hours. No results for input(s): TROPIPOC in the last 168 hours.   BNP Recent Labs  Lab 05/25/19 0135  BNP 2,812.1*     DDimer No results for input(s): DDIMER in the last 168 hours.   Radiology    DG Chest 1 View  Result Date: 05/27/2019 CLINICAL DATA:  Status post left thoracentesis EXAM: CHEST  1 VIEW COMPARISON:  05/24/2019 FINDINGS: Interval thoracentesis has been performed on the left with near complete reduction of the left-sided pleural effusion. No pneumothorax is noted. No focal infiltrate is seen. Cardiac shadow is stable. Aortic calcifications are again noted. IMPRESSION: Resolution of left pleural effusion.  No pneumothorax is noted. Electronically Signed   By: Inez Catalina M.D.   On: 05/27/2019 13:48   IR THORACENTESIS ASP PLEURAL SPACE W/IMG GUIDE  Result Date: 05/27/2019 INDICATION: Patient with history of  congestive heart failure, now with left pleural effusion. Request is made for therapeutic thoracentesis. EXAM: ULTRASOUND GUIDED THERAPEUTIC THORACENTESIS MEDICATIONS: 10 mL 1% lidocaine COMPLICATIONS: None immediate. PROCEDURE: An ultrasound guided thoracentesis was thoroughly discussed with the patient and questions answered. The benefits, risks, alternatives and complications were also discussed. The patient understands and wishes to proceed with the procedure. Written consent was obtained. Ultrasound was performed to localize and mark an adequate pocket of fluid in the left chest. The area was then prepped and draped in the normal sterile fashion. 1% Lidocaine was used for local anesthesia. Under ultrasound guidance a 6 Fr Safe-T-Centesis catheter was introduced. Thoracentesis was performed. The catheter was removed and a dressing applied. FINDINGS: A total of approximately 550 mL of yellow fluid was removed. IMPRESSION: Successful ultrasound guided therapeutic thoracentesis yielding 550 mL of pleural fluid. Read by: Brynda Greathouse PA-C Electronically Signed   By: Jerilynn Mages.  Shick M.D.   On: 05/27/2019 13:58   Telemetry    NSR- Personally Reviewed  ECG    No new tracing as of 05/27/2019- Personally Reviewed  Cardiac Studies   Echo 05/25/19:  1. Left ventricular ejection fraction, by estimation, is 45 to 50%. The  left ventricle has mildly decreased function. The left ventricle  demonstrates regional wall motion abnormalities (see scoring  diagram/findings for description). There is moderate  left ventricular hypertrophy. Left ventricular diastolic parameters are  indeterminate. There is moderate hypokinesis of the left ventricular,  entire inferior wall.  2. Right ventricular systolic function is normal. The right ventricular  size is normal. There is normal pulmonary artery systolic pressure.  3. Left atrial size was severely dilated.  4. Large pleural effusion in the left lateral region.  5.  The mitral valve is grossly normal. Moderate to severe mitral valve  regurgitation.  6. The aortic valve is tricuspid. Aortic valve regurgitation is not  visualized. Mild to moderate aortic valve stenosis. Aortic valve area, by  VTI measures 1.49 cm. Aortic valve mean gradient measures 10.4 mmHg.  Aortic valve Vmax measures 2.17 m/s.  7. The inferior vena cava  is dilated in size with >50% respiratory  variability, suggesting right atrial pressure of 8 mmHg.   Comparison(s): 02/16/19: LVEF 60-65%, severe MR, severe LAE.   Echo 02/16/2019:  1. Left ventricular ejection fraction, by visual estimation, is 60 to  65%. is mildly increased left ventricular hypertrophy.  2. Left ventricular diastolic parameters are indeterminate.  3. Mildly dilated left ventricular internal cavity size.  4. Global right ventricle has normal systolic function.The right  ventricular size is normal. No increase in right ventricular wall  thickness.  5. Left atrial size was severely dilated.  6. Right atrial size was normal.  7. Mild mitral valve prolapse.  8. Severe mitral valve regurgitation. No evidence of mitral stenosis.  9. The tricuspid valve is normal in structure.  10. The aortic valve is normal in structure. Aortic valve regurgitation is  not visualized. Mild aortic valve stenosis.  11. The pulmonic valve was normal in structure. Pulmonic valve  regurgitation is not visualized.  12. There is dilatation of the ascending aorta measuring 38 mm.  13. Mildly elevated pulmonary artery systolic pressure.  14. The inferior vena cava is normal in size with greater than 50%  respiratory variability, suggesting right atrial pressure of 3 mmHg.  15. Severe MR with jet directed posteriorly. Minimal prolaps of the middle  segment of the anterior leaflet of the mitral valve noted A2.   Patient Profile     86 y.o. female with history of CAD (cath 2016 elsewhere, no report available, was told 1  occluded artery with collateral managed medically),paroxysmal atrial fibrillation,chronic diastolic CHF,severe mitral regurgitation,mild AS,CKDstage IV,anemia,type 2 diabetes mellitus,hypertension, hyperlipidemia, hypothyroidism, breast CA. She was seen by Dr. Agustin Cree via telemedicine in 03/2019 who discussed options for surgery for her mitral valve disease including open heart surgery vs MitraClip. She had recently been diagnosed with breast CA so wanted to postpone any intervention until breast cancer plan. Per oncology she has wished to defer surgery and utilize anastrazole instead. She has also recently been followed for anemia felt due to renal disease. She presented to [facility] with increasing SOB and edema. She was found to have elevated BNP, left sided pleural effusion and elevated troponins.  Assessment & Plan    Symptomatic conversion pause, 6 seconds Tachy brady syndrome Paroxysmal atrial fibrillation - rapid atrial fibrillation while off of carvedilol due to sinus bradycardia, with long conversion pause that was symptomatic. - Will discuss with EP.  - discussed with patient that pacemaker may be indicated for quality of life as she is a functional 86 yo female, to avoid syncope - further discussions per EP.  - will discuss starting AAD to maintain sinus rhythm, with structural heart disease, and renal dysfunction, amiodarone likely best option.  - no syncope at home but symptomatic hypotension with antihypertensive therapy, see below.  Acute on chronic diastolic CHF with left pleural effusion: S/p thoracentesis with 550 cc out yesterday. Breathing has improved except during rapid afib. Cr: 2.37 UOP yesterday: 2.05 L Weight: 175>174>174>173 lb Infusions: n/a Net negative for admission: -2 L Diuresis plan: Continue IV lasix 40 mg BID today, will transition to oral tomorrow with renal function worsening.   Severe mitral regurgitation with mild aortic stenosis: -Repeat TTE 05/25/19  with moderate to severe MR, mild to moderate AI - appears to have primary mitral valve regurgitation. We have briefly discussed a workup for mitraclip, she is undecided on pursuing this, will discuss further over days to come.  - if TEE performed, consider TEE with structural  imager for mitraclip evaluation.  - she tells me at her age she does not want an open surgery if a candidate.  Elevated troponin with known CAD: -Initial elevation felt to be secondary to demand ischemia from elevated LV filling pressures however cannot exclude underlying ACS given degree of elevation>>trend is however flat ([id number]) -Does have reports of a bandlike sensation around her bilateral lower breast  -We will obtain EKG, HST -May benefit from further ischemic work-up while inpatient, patient has chosen medical management in the past. No significant chest pain this admission. Should consider R/LHC next week when diuresed if patient is amenable. Renal function somewhat prohibitive given patient's preference for no dialysis.  -Continue ASA, BB, statin and Imdur -On Eliquis, not on heparin gtt   CKD Stage IV: -Creatinine today, 2.37 -Continue current dose of her Lasix, she is improving, renal function marginally worse than yesterday, will need to convert to oral tomorrow.  -BMET in AM daily  [name], MD 05/28/19 11:11 AM

## 2019-05-28 NOTE — Progress Notes (Signed)
Notified [facility] that pt's HR is sustained in the 130's/140's; provider ordered 5mg  metoprolol IV PRN which reduced HR to 110's/120's. Pt not exhibiting any signs of distress; BP and oxygen saturations stable, no report of chest pain or palpitations.

## 2019-05-28 NOTE — Progress Notes (Signed)
PROGRESS NOTE    Lauren Walker  VQM:[id number] DOB: 31-Dec-1929 DOA: 05/24/2019 PCP: Midge Minium, MD   Brief Narrative: Patient is 86 year old female with history of severe mitral regurgitation, paroxysmal A. fib, coronary artery disease, CKD stage IV, diabetes type 2, hypothyroidism, breast cancer, chronic anemia who presents to the emergency room with complaint of shortness of breath.  She was also having increased bilateral lower extremity edema over few weeks and became acutely short of breath a day before admission.  On presentation, x-ray of the chest showed moderate left pleural effusion with possible infiltrates likely due to pulmonary edema.  Troponin was elevated.  Elevated BNP.  Cardiology consulted for evaluation of acute congestive heart failure with possible non-STEMI.  She has been started on IV Lasix.  Cardiology planning for EP consultation,TEE for mitral valve evaluation, possible  ischemic work-up.  Assessment & Plan:   Principal Problem:   Acute CHF (congestive heart failure) (HCC) Active Problems:   Hypothyroid   HTN (hypertension)   CAD (coronary artery disease)   Mitral regurgitation moderate to severe based on echocardiogram in summer 2020   Malignant neoplasm of upper-outer quadrant of left breast in female, estrogen receptor positive ([place])   CKD stage G4/A1, GFR 15-29 and albumin creatinine ratio <30 mg/g (HCC)   Anemia associated with stage 4 chronic renal failure (HCC)   Non-ST elevation MI (NSTEMI) (HCC)   Acute systolic CHF: Presented with volume overload, bilateral lower extremity edema, shortness of breath, chest pain.  Echocardiogram done here showed ejection fraction of 40 to 45%, regional wall motion abnormality in the left ventricle, moderate LVH. Currently on IV Lasix 40 mg IV twice daily.  Cardiology following.  Monitor input/output, daily weight.  Moderate/severe mitral regurgitation: As per echo.   Cardiology planning for TEE?  Chest  pain/elevated troponin/history of coronary artery disease: EKG did not show any ischemic changes.  Elevated troponin could be associated with congestive heart failure but echo has shown regional wall motion abnormality in the left ventricle.   On aspirin at home.  Cardiology thinks he might need ischemic work-up.  Left-sided pleural effusion :She was complaining of lower chest tightness on both sides today.  Chest x-ray had shown moderate left-sided pleural effusion. S/P US thoracentesis with removal of 550 mL of yellow fluid.  CKD stage IIIb: Baseline creatinine ranges from 2-2.5.  Currently kidney function at baseline.  Monitor closely.  Hypertension: Currently blood pressure stable.  Continue current medications.  She is on hydralazine, Imdur, carvedilol.  Paroxysmal A. fib: On Eliquis for anticoagulation.  Currently rate is controlled. She was on carvedilol at home which is on hold due to concern for bradycardia/pauses..  Cardiology consulting EP.  Concern for tachy brady syndrome  Left breast cancer: Estrogen receptor positive.  Follows with Dr. Jana Hakim.  On anastrozole.  Hyperlipidemia: On Lipitor  Hypothyroidism: On Synthyroid  Normocytic anemia: Most likely associated with CKD.  Currently hemoglobin stable.                 DVT prophylaxis:Eliquis Code Status: DNR Family Communication: Discussed with husband at bedside on  05/27/2019 Disposition Plan: Patient is from home.  Needs cardiology clearance before discharge.  Cardiology planning for TEE/possible ischemic work-up/EP consultation   Consultants: Cardiology  Procedures: None  Antimicrobials:  Anti-infectives (From admission, onward)   None      Subjective: Patient seen and examined at the bedside this morning.  She feels much better today.  Sitting in the chair.  Lower extremity  edema have improved.  The tightness of the lower chest yesterday has resolved.  Objective: Vitals:   05/27/19 2000 05/27/19  2007 05/28/19 0120 05/28/19 0544  BP:  137/64 (!) 165/50 (!) 94/56  Pulse:  66 71 88  Resp: 18 17 19 20   Temp:  98.1 F (36.7 C) 98.1 F (36.7 C) 98.2 F (36.8 C)  TempSrc:  Oral Oral Oral  SpO2:  98% 97% 98%  Weight:      Height:        Intake/Output Summary (Last 24 hours) at 05/28/2019 0741 Last data filed at 05/28/2019 0542 Gross per 24 hour  Intake 240 ml  Output 2050 ml  Net -1810 ml   Filed Weights   05/25/19 1451 05/26/19 0500 05/27/19 0104  Weight: 79.2 kg 79.2 kg 78.7 kg    Examination:   General exam: Comfortable Respiratory system: Bilateral equal air entry, normal vesicular breath sounds, no wheezes or crackles  Cardiovascular system:Afib. No JVD, murmurs, rubs, gallops or clicks. Gastrointestinal system: Abdomen is nondistended, soft and nontender. No organomegaly or masses felt. Normal bowel sounds heard. Central nervous system: Alert and oriented. No focal neurological deficits. Extremities: 2-3+ bilateral lower extremity edema, no clubbing ,no cyanosis Skin: No rashes, lesions or ulcers,no icterus ,no pallor    Data Reviewed: I have personally reviewed following labs and imaging studies  CBC: Recent Labs  Lab 05/24/19 2036 05/25/19 0617 05/27/19 0421  WBC 7.2 7.5 6.3  NEUTROABS  --  5.4 3.9  HGB 9.5* 9.4* 8.6*  HCT 31.1* 30.0* 27.2*  MCV 98.4 98.0 95.8  PLT 267 233 161   Basic Metabolic Panel: Recent Labs  Lab 05/24/19 2036 05/25/19 0617 05/26/19 0457 05/27/19 0421 05/28/19 0424  NA 138 141 140 140 139  K 4.0 4.0 4.0 3.8 4.0  CL 106 103 102 100 96*  CO2 26 26 28 29 29   GLUCOSE 171* 130* 112* 123* 137*  BUN 53* 51* 54* 55* 60*  CREATININE 2.08* 2.04* 2.22* 2.22* 2.37*  CALCIUM 8.7* 8.7* 8.5* 8.7* 9.2   GFR: Estimated Creatinine Clearance: 16.7 mL/min (A) (by C-G formula based on SCr of 2.37 mg/dL (H)). Liver Function Tests: Recent Labs  Lab 05/25/19 0617  AST 27  ALT 14  ALKPHOS 18*  BILITOT 0.5  PROT 5.5*  ALBUMIN 3.1*     No results for input(s): LIPASE, AMYLASE in the last 168 hours. No results for input(s): AMMONIA in the last 168 hours. Coagulation Profile: No results for input(s): INR, PROTIME in the last 168 hours. Cardiac Enzymes: No results for input(s): CKTOTAL, CKMB, CKMBINDEX, TROPONINI in the last 168 hours. BNP (last 3 results) No results for input(s): PROBNP in the last 8760 hours. HbA1C: No results for input(s): HGBA1C in the last 72 hours. CBG: Recent Labs  Lab 05/27/19 0607 05/27/19 1419 05/27/19 1647 05/27/19 2112 05/28/19 0626  GLUCAP 111* 217* 138* 136* 136*   Lipid Profile: No results for input(s): CHOL, HDL, LDLCALC, TRIG, CHOLHDL, LDLDIRECT in the last 72 hours. Thyroid Function Tests: No results for input(s): TSH, T4TOTAL, FREET4, T3FREE, THYROIDAB in the last 72 hours. Anemia Panel: No results for input(s): VITAMINB12, FOLATE, FERRITIN, TIBC, IRON, RETICCTPCT in the last 72 hours. Sepsis Labs: No results for input(s): PROCALCITON, LATICACIDVEN in the last 168 hours.  Recent Results (from the past 240 hour(s))  SARS CORONAVIRUS 2 (TAT 6-24 HRS) Nasopharyngeal Nasopharyngeal Swab     Status: None   Collection Time: 05/25/19  1:35 AM   Specimen: Nasopharyngeal Swab  Result Value Ref Range Status   SARS Coronavirus 2 NEGATIVE NEGATIVE Final    Comment: (NOTE) SARS-CoV-2 target nucleic acids are NOT DETECTED. The SARS-CoV-2 RNA is generally detectable in upper and lower respiratory specimens during the acute phase of infection. Negative results do not preclude SARS-CoV-2 infection, do not rule out co-infections with other pathogens, and should not be used as the sole basis for treatment or other patient management decisions. Negative results must be combined with clinical observations, patient history, and epidemiological information. The expected result is Negative. Fact Sheet for Patients: SugarRoll.be Fact Sheet for Healthcare  Providers: https://www.woods-mathews.com/ This test is not yet approved or cleared by the [place] FDA and  has been authorized for detection and/or diagnosis of SARS-CoV-2 by FDA under an Emergency Use Authorization (EUA). This EUA will remain  in effect (meaning this test can be used) for the duration of the COVID-19 declaration under Section 56 4(b)(1) of the Act, 21 U.S.C. section 360bbb-3(b)(1), unless the authorization is terminated or revoked sooner. Performed at [facility], [address], [place], [place] [address]          Radiology Studies: DG Chest 1 View  Result Date: 05/27/2019 CLINICAL DATA:  Status post left thoracentesis EXAM: CHEST  1 VIEW COMPARISON:  05/24/2019 FINDINGS: Interval thoracentesis has been performed on the left with near complete reduction of the left-sided pleural effusion. No pneumothorax is noted. No focal infiltrate is seen. Cardiac shadow is stable. Aortic calcifications are again noted. IMPRESSION: Resolution of left pleural effusion.  No pneumothorax is noted. Electronically Signed   By: Inez Catalina M.D.   On: 05/27/2019 13:48   IR THORACENTESIS ASP PLEURAL SPACE W/IMG GUIDE  Result Date: 05/27/2019 INDICATION: Patient with history of congestive heart failure, now with left pleural effusion. Request is made for therapeutic thoracentesis. EXAM: ULTRASOUND GUIDED THERAPEUTIC THORACENTESIS MEDICATIONS: 10 mL 1% lidocaine COMPLICATIONS: None immediate. PROCEDURE: An ultrasound guided thoracentesis was thoroughly discussed with the patient and questions answered. The benefits, risks, alternatives and complications were also discussed. The patient understands and wishes to proceed with the procedure. Written consent was obtained. Ultrasound was performed to localize and mark an adequate pocket of fluid in the left chest. The area was then prepped and draped in the normal sterile fashion. 1% Lidocaine was used for local anesthesia.  Under ultrasound guidance a 6 Fr Safe-T-Centesis catheter was introduced. Thoracentesis was performed. The catheter was removed and a dressing applied. FINDINGS: A total of approximately 550 mL of yellow fluid was removed. IMPRESSION: Successful ultrasound guided therapeutic thoracentesis yielding 550 mL of pleural fluid. Read by: Brynda Greathouse PA-C Electronically Signed   By: Jerilynn Mages.  Shick M.D.   On: 05/27/2019 13:58        Scheduled Meds: . anastrozole  1 mg Oral Daily  . apixaban  2.5 mg Oral BID  . vitamin C  500 mg Oral Daily  . aspirin EC  81 mg Oral Daily  . atorvastatin  40 mg Oral q1800  . fenofibrate  160 mg Oral Daily  . ferrous sulfate  325 mg Oral Daily  . furosemide  40 mg Intravenous Q12H  . hydrALAZINE  50 mg Oral TID  . insulin aspart  0-9 Units Subcutaneous TID WC  . isosorbide mononitrate  30 mg Oral Daily  . levothyroxine  150 mcg Oral QAC breakfast  . magnesium oxide  400 mg Oral Daily   Continuous Infusions:   LOS: 3 days    Time spent:35 mins.  More than 50% of that time was spent in counseling and/or coordination of care.      Shelly Coss, MD [facility] P4/11/2019, 7:41 AM

## 2019-05-28 NOTE — Consult Note (Signed)
ELECTROPHYSIOLOGY CONSULT NOTE    Primary Care Physician: Midge Minium, MD Referring Physician:   Dr Margaretann Loveless  Admit Date: 05/24/2019  Reason for consultation:  Post termination pause  Lauren Walker is a 86 y.o. female with a h/o severe mitral regurgitation, paroxysmal atrial fibrillation, DM, HTN, breast CA, CAD, and hypothyroidism, who presented with worsening SOB.  This occurs in the setting of newly depressed EF, severe mitral regurgitation and atrial fibrillation. She has been managed with diuresis and cardiology has been following closely. She has had intermittent atrial fibrillation while in the hospital.  This am, she had a 6 second pause with presyncope.  This occurred shortly after receiving IV lopressor for afib with RVR.  She had subsequently had sinus rhythm. She denies any prior history of similar symptoms to suggest prior pauses.  She denies dizziness, presyncope or syncope in the past.    Past Medical History:  Diagnosis Date  . Arthritis   . Chronic kidney disease   . Chronic systolic dysfunction of left ventricle   . Coronary artery disease   . Diabetes mellitus without complication ([place])   . Diverticulitis   . Hypertension   . Hypothyroidism   . Myocardial infarction ([place])   . Paroxysmal atrial fibrillation (HCC)   . Thyroid disease    Past Surgical History:  Procedure Laterality Date  . APPENDECTOMY  1936  . IR THORACENTESIS ASP PLEURAL SPACE W/IMG GUIDE  05/27/2019  . PARS PLANA VITRECTOMY Right 12/04/2018   Procedure: RETINAL DETACHMENT REPAIR PPV 25 GAUGE WITH ENDO LASER AIR/FLUID EXCHANGE SF6 GAS INJECTION;  Surgeon: Jalene Mullet, MD;  Location: [facility];  Service: Ophthalmology;  Laterality: Right;  . TONSILLECTOMY      . amiodarone  200 mg Oral BID  . anastrozole  1 mg Oral Daily  . apixaban  2.5 mg Oral BID  . vitamin C  500 mg Oral Daily  . aspirin EC  81 mg Oral Daily  . atorvastatin  40 mg Oral q1800  . fenofibrate  160 mg Oral Daily  .  ferrous sulfate  325 mg Oral Daily  . furosemide  40 mg Intravenous Q12H  . hydrALAZINE  25 mg Oral TID  . insulin aspart  0-9 Units Subcutaneous TID WC  . isosorbide mononitrate  30 mg Oral Daily  . levothyroxine  150 mcg Oral QAC breakfast  . magnesium oxide  400 mg Oral Daily     Allergies  Allergen Reactions  . Tape Itching and Rash    Surgical tape from cyst removed    Social History   Socioeconomic History  . Marital status: Married    Spouse name: Not on file  . Number of children: Not on file  . Years of education: Not on file  . Highest education level: Not on file  Occupational History  . Not on file  Tobacco Use  . Smoking status: Former Research scientist (life sciences)  . Smokeless tobacco: Never Used  Substance and Sexual Activity  . Alcohol use: Yes    Comment: Very rare  . Drug use: Not Currently  . Sexual activity: Not Currently  Other Topics Concern  . Not on file  Social History Narrative  . Not on file   Social Determinants of Health   Financial Resource Strain:   . Difficulty of Paying Living Expenses:   Food Insecurity:   . Worried About Charity fundraiser in the Last Year:   . [place] in the Last Year:  Transportation Needs:   . Film/video editor (Medical):   Marland Kitchen Lack of Transportation (Non-Medical):   Physical Activity:   . Days of Exercise per Week:   . Minutes of Exercise per Session:   Stress:   . Feeling of Stress :   Social Connections:   . Frequency of Communication with Friends and Family:   . Frequency of Social Gatherings with Friends and Family:   . Attends Religious Services:   . Active Member of Clubs or Organizations:   . Attends Archivist Meetings:   Marland Kitchen Marital Status:   Intimate Partner Violence:   . Fear of Current or Ex-Partner:   . Emotionally Abused:   Marland Kitchen Physically Abused:   . Sexually Abused:     Family History  Problem Relation Age of Onset  . Arthritis Mother   . Heart disease Mother   . Hypertension  Mother   . Arthritis Father   . Heart attack Father   . Heart disease Father   . Arthritis Daughter   . Arthritis Son   . Depression Maternal Aunt   . Hyperlipidemia Maternal Aunt   . Hypertension Maternal Aunt     ROS- All systems are reviewed and negative except as per the HPI above  Physical Exam: Telemetry:  afib with RVR and subsequent post termination pause of 6 seconds noted,  No other prolonged pauses or brady events are noted. Vitals:   05/28/19 1143 05/28/19 1619 05/28/19 1637 05/28/19 1956  BP:  123/76  (!) 139/52  Pulse:  (!) 121 (!) 103 67  Resp:  18  18  Temp:  98.4 F (36.9 C)  98.9 F (37.2 C)  TempSrc:  Oral  Oral  SpO2: 99% 98%  99%  Weight:      Height:        GEN- The patient is well appearing, alert and oriented x 3 today.   Head- normocephalic, atraumatic Eyes-  Sclera clear, conjunctiva pink Ears- hearing intact Oropharynx- clear Neck- supple  Lungs-  normal work of breathing Heart- Regular rate and rhythm  GI- soft  Extremities- no clubbing, cyanosis, or edema MS- diffuse atrophy Skin- no rash or lesion Psych- euthymic mood, full affect Neuro- strength and sensation are intact  EKG- from 05/27/19 reveals sinus with frequent atrial ectopy  Labs:   Lab Results  Component Value Date   WBC 6.3 05/27/2019   HGB 8.6 (L) 05/27/2019   HCT 27.2 (L) 05/27/2019   MCV 95.8 05/27/2019   PLT 250 05/27/2019    Recent Labs  Lab 05/25/19 0617 05/26/19 0457 05/28/19 0424  NA 141   < > 139  K 4.0   < > 4.0  CL 103   < > 96*  CO2 26   < > 29  BUN 51*   < > 60*  CREATININE 2.04*   < > 2.37*  CALCIUM 8.7*   < > 9.2  PROT 5.5*  --   --   BILITOT 0.5  --   --   ALKPHOS 18*  --   --   ALT 14  --   --   AST 27  --   --   GLUCOSE 130*   < > 137*   < > = values in this interval not displayed.    Echo:  EF 45%, moderate to severe MR  ASSESSMENT AND PLAN:   1. Post termination pause with presyncope Occurred in the setting afib with RVR and  receiving IV  metoprolol No prior episodes by history Given her advanced age, we should pursue a conservative approach. If we can maintain sinus rhythm, then hopefully we can avoid RVR and post termination pauses.  If pauses continue then we will consider PPM as the next option  2. Paroxysmal atrial fibrillation Likely secondary to severe MR chads2vasc score is at least 7.  She is on eliquis for stroke prevention Start amiodarone 200mg  BID at this time.  I have spoken with Dr Margaretann Loveless at length.  We agree that she really does not have any other AAD options. Hopefully she will remain in sinus with amiodarone. Will need to follow LFTs, TFTs closely after discharge   Cardiology to follow over the weekend Dr Lovena Le will follow-up on Monday.  Thompson Grayer, MD 05/28/2019  7:58 PM

## 2019-05-29 ENCOUNTER — Inpatient Hospital Stay (HOSPITAL_COMMUNITY): Payer: Medicare Other

## 2019-05-29 LAB — CBC WITH DIFFERENTIAL/PLATELET
Abs Immature Granulocytes: 0.01 10*3/uL (ref 0.00–0.07)
Basophils Absolute: 0 10*3/uL (ref 0.0–0.1)
Basophils Relative: 1 %
Eosinophils Absolute: 0.2 10*3/uL (ref 0.0–0.5)
Eosinophils Relative: 4 %
HCT: 25.9 % — ABNORMAL LOW (ref 36.0–46.0)
Hemoglobin: 8.2 g/dL — ABNORMAL LOW (ref 12.0–15.0)
Immature Granulocytes: 0 %
Lymphocytes Relative: 28 %
Lymphs Abs: 1.5 10*3/uL (ref 0.7–4.0)
MCH: 30 pg (ref 26.0–34.0)
MCHC: 31.7 g/dL (ref 30.0–36.0)
MCV: 94.9 fL (ref 80.0–100.0)
Monocytes Absolute: 0.7 10*3/uL (ref 0.1–1.0)
Monocytes Relative: 13 %
Neutro Abs: 2.8 10*3/uL (ref 1.7–7.7)
Neutrophils Relative %: 54 %
Platelets: 222 10*3/uL (ref 150–400)
RBC: 2.73 MIL/uL — ABNORMAL LOW (ref 3.87–5.11)
RDW: 14.1 % (ref 11.5–15.5)
WBC: 5.2 10*3/uL (ref 4.0–10.5)
nRBC: 0 % (ref 0.0–0.2)

## 2019-05-29 LAB — BASIC METABOLIC PANEL WITH GFR
Anion gap: 11 (ref 5–15)
BUN: 60 mg/dL — ABNORMAL HIGH (ref 8–23)
CO2: 30 mmol/L (ref 22–32)
Calcium: 8.6 mg/dL — ABNORMAL LOW (ref 8.9–10.3)
Chloride: 97 mmol/L — ABNORMAL LOW (ref 98–111)
Creatinine, Ser: 2.32 mg/dL — ABNORMAL HIGH (ref 0.44–1.00)
GFR calc Af Amer: 21 mL/min — ABNORMAL LOW
GFR calc non Af Amer: 18 mL/min — ABNORMAL LOW
Glucose, Bld: 111 mg/dL — ABNORMAL HIGH (ref 70–99)
Potassium: 3.4 mmol/L — ABNORMAL LOW (ref 3.5–5.1)
Sodium: 138 mmol/L (ref 135–145)

## 2019-05-29 LAB — MAGNESIUM: Magnesium: 2.1 mg/dL (ref 1.7–2.4)

## 2019-05-29 LAB — GLUCOSE, CAPILLARY
Glucose-Capillary: 101 mg/dL — ABNORMAL HIGH (ref 70–99)
Glucose-Capillary: 131 mg/dL — ABNORMAL HIGH (ref 70–99)
Glucose-Capillary: 127 mg/dL — ABNORMAL HIGH (ref 70–99)
Glucose-Capillary: 224 mg/dL — ABNORMAL HIGH (ref 70–99)

## 2019-05-29 LAB — HEPARIN LEVEL (UNFRACTIONATED): Heparin Unfractionated: >2.2 IU/mL — ABNORMAL HIGH (ref 0.30–0.70)

## 2019-05-29 LAB — APTT: aPTT: 29 seconds (ref 24–36)

## 2019-05-29 MED ORDER — SODIUM CHLORIDE 0.9 % IV SOLN
INTRAVENOUS | Status: DC | PRN
  Administered 2019-05-29 – 2019-05-30 (×2): 250 mL via INTRAVENOUS

## 2019-05-29 MED ORDER — POTASSIUM CHLORIDE CRYS ER 20 MEQ PO TBCR
40.0000 meq | EXTENDED_RELEASE_TABLET | Freq: Once | ORAL | Status: AC
  Administered 2019-05-29: 09:00:00 40 meq via ORAL
  Filled 2019-05-29: qty 2

## 2019-05-29 MED ORDER — SODIUM CHLORIDE 0.9% FLUSH
3.0000 mL | Freq: Two times a day (BID) | INTRAVENOUS | Status: DC
  Administered 2019-05-29 – 2019-05-30 (×2): 3 mL via INTRAVENOUS

## 2019-05-29 MED ORDER — FUROSEMIDE 10 MG/ML IJ SOLN: 40.0000 mg | Freq: Every day | INTRAMUSCULAR | Status: DC

## 2019-05-29 MED ORDER — HEPARIN (PORCINE) 25000 UT/250ML-% IV SOLN
850.0000 [IU]/h | INTRAVENOUS | Status: DC
  Administered 2019-05-29 – 2019-05-31 (×2): 850 [IU]/h via INTRAVENOUS
  Filled 2019-05-29 (×2): qty 250

## 2019-05-29 MED ORDER — INSULIN ASPART 100 UNIT/ML ~~LOC~~ SOLN
0.0000 [IU] | Freq: Three times a day (TID) | SUBCUTANEOUS | Status: DC
  Administered 2019-05-30: 2 [IU] via SUBCUTANEOUS
  Administered 2019-05-30: 1 [IU] via SUBCUTANEOUS

## 2019-05-29 NOTE — Progress Notes (Addendum)
PROGRESS NOTE    Lauren Walker  LPF:[id number] DOB: 18-Nov-1929 DOA: 05/24/2019 PCP: Midge Minium, MD   Brief Narrative: Patient is 86 year old female with history of severe mitral regurgitation, paroxysmal A. fib, coronary artery disease, CKD stage IV, diabetes type 2, hypothyroidism, breast cancer, chronic anemia who presents to the emergency room with complaint of shortness of breath.  She was also having increased bilateral lower extremity edema over few weeks and became acutely short of breath a day before admission.  On presentation, x-ray of the chest showed moderate left pleural effusion with possible infiltrates likely due to pulmonary edema.  Troponin was elevated.  Elevated BNP.  Cardiology consulted for evaluation of acute congestive heart failure with possible non-STEMI.  She has been started on IV Lasix.  Cardiology planning for EP consultation,TEE for mitral valve evaluation, possible  ischemic work-up.  Assessment & Plan:   Principal Problem:   Acute CHF (congestive heart failure) (HCC) Active Problems:   Hypothyroid   HTN (hypertension)   CAD (coronary artery disease)   Mitral regurgitation moderate to severe based on echocardiogram in summer 2020   Malignant neoplasm of upper-outer quadrant of left breast in female, estrogen receptor positive ([place])   CKD stage G4/A1, GFR 15-29 and albumin creatinine ratio <30 mg/g (HCC)   Anemia associated with stage 4 chronic renal failure (HCC)   Non-ST elevation MI (NSTEMI) (HCC)   Acute systolic CHF: Presented with volume overload, bilateral lower extremity edema, shortness of breath, chest pain.  Echocardiogram done here showed ejection fraction of 40 to 45%, regional wall motion abnormality in the left ventricle, moderate LVH. Currently on IV Lasix 40 mg IV twice daily.  Cardiology following.  Monitor input/output, daily weight.  Moderate/severe mitral regurgitation: As per echo.   Cardiology following  Chest pain/elevated  troponin/history of coronary artery disease: EKG did not show any ischemic changes.  Elevated troponin could be associated with congestive heart failure but echo has shown regional wall motion abnormality in the left ventricle.   On aspirin at home.  Cardiology planning for ischemic work-up.  Left-sided pleural effusion :She was complaining of lower chest tightness on both sides today.  Chest x-ray had shown moderate left-sided pleural effusion. S/P US thoracentesis with removal of 550 mL of yellow fluid.  CKD stage IIIb: Baseline creatinine ranges from 2-2.5.  Currently kidney function at baseline.  Monitor closely.  Hypertension: Currently blood pressure stable.  Continue current medications.  She is on hydralazine, Imdur.  Paroxysmal A. fib: On Eliquis for anticoagulation.  Currently rate is controlled. She was on carvedilol at home which is on hold due to concern for bradycardia/pauses..  EP consulted.  Started on amiodarone.  This morning she was in normal sinus rhythm.  Left breast cancer: Estrogen receptor positive.  Follows with Dr. Jana Hakim.  On anastrozole.  Hyperlipidemia: On Lipitor  Hypothyroidism: On Synthyroid  Normocytic anemia: Most likely associated with CKD.  Currently hemoglobin stable.   Hypokalemia: Supplemented  Prolonged QTC: QTC of 509.  We will continue to monitor.           DVT prophylaxis:Eliquis Code Status: DNR Family Communication: Discussed with husband at bedside on  05/27/2019 Disposition Plan: Patient is from home.  Needs cardiology clearance before discharge.  Cardiology planning for TEE/possible ischemic work-up.  Consultants: Cardiology  Procedures: None  Antimicrobials:  Anti-infectives (From admission, onward)   None      Subjective:  Patient seen and examined at the bedside this morning.  Hemodynamically stable.  Sitting on the chair.  At 6:00 this morning, she had some chest tightness on the left side which has resolved.  EKG was  done this morning which did not show any ischemic changes.  Looks comfortable during my evaluation.  Objective: Vitals:   05/28/19 1637 05/28/19 1956 05/29/19 0450 05/29/19 0646  BP:  (!) 139/52 (!) 138/41 (!) 148/54  Pulse: (!) 103 67 72 73  Resp:  18 20   Temp:  98.9 F (37.2 C) 98.1 F (36.7 C) 98.3 F (36.8 C)  TempSrc:  Oral Oral Oral  SpO2:  99% 97% 97%  Weight:   75 kg   Height:        Intake/Output Summary (Last 24 hours) at 05/29/2019 0738 Last data filed at 05/29/2019 0451 Gross per 24 hour  Intake 960 ml  Output 2650 ml  Net -1690 ml   Filed Weights   05/26/19 0500 05/27/19 0104 05/29/19 0450  Weight: 79.2 kg 78.7 kg 75 kg    Examination:  General exam: Comfortable, pleasant elderly female Respiratory system: Bilateral equal air entry, normal vesicular breath sounds, no wheezes or crackles  Cardiovascular system: S1 & S2 heard, RRR. No JVD, murmurs, rubs, gallops or clicks. Gastrointestinal system: Abdomen is nondistended, soft and nontender. No organomegaly or masses felt. Normal bowel sounds heard. Central nervous system: Alert and oriented. No focal neurological deficits. Extremities:1-2+ bilateral lower extremity edema, no clubbing ,no cyanosis, distal peripheral pulses palpable. Skin: No rashes, lesions or ulcers,no icterus ,no pallor      Data Reviewed: I have personally reviewed following labs and imaging studies  CBC: Recent Labs  Lab 05/24/19 2036 05/25/19 0617 05/27/19 0421 05/29/19 0409  WBC 7.2 7.5 6.3 5.2  NEUTROABS  --  5.4 3.9 2.8  HGB 9.5* 9.4* 8.6* 8.2*  HCT 31.1* 30.0* 27.2* 25.9*  MCV 98.4 98.0 95.8 94.9  PLT 267 233 250 175   Basic Metabolic Panel: Recent Labs  Lab 05/25/19 0617 05/26/19 0457 05/27/19 0421 05/28/19 0424 05/29/19 0409  NA 141 140 140 139 138  K 4.0 4.0 3.8 4.0 3.4*  CL 103 102 100 96* 97*  CO2 26 28 29 29 30   GLUCOSE 130* 112* 123* 137* 111*  BUN 51* 54* 55* 60* 60*  CREATININE 2.04* 2.22* 2.22*  2.37* 2.32*  CALCIUM 8.7* 8.5* 8.7* 9.2 8.6*  MG  --   --   --   --  2.1   GFR: Estimated Creatinine Clearance: 16.6 mL/min (A) (by C-G formula based on SCr of 2.32 mg/dL (H)). Liver Function Tests: Recent Labs  Lab 05/25/19 0617  AST 27  ALT 14  ALKPHOS 18*  BILITOT 0.5  PROT 5.5*  ALBUMIN 3.1*   No results for input(s): LIPASE, AMYLASE in the last 168 hours. No results for input(s): AMMONIA in the last 168 hours. Coagulation Profile: No results for input(s): INR, PROTIME in the last 168 hours. Cardiac Enzymes: No results for input(s): CKTOTAL, CKMB, CKMBINDEX, TROPONINI in the last 168 hours. BNP (last 3 results) No results for input(s): PROBNP in the last 8760 hours. HbA1C: No results for input(s): HGBA1C in the last 72 hours. CBG: Recent Labs  Lab 05/28/19 0626 05/28/19 1114 05/28/19 1748 05/28/19 2116 05/29/19 0617  GLUCAP 136* 105* 138* 114* 101*   Lipid Profile: No results for input(s): CHOL, HDL, LDLCALC, TRIG, CHOLHDL, LDLDIRECT in the last 72 hours. Thyroid Function Tests: No results for input(s): TSH, T4TOTAL, FREET4, T3FREE, THYROIDAB in the last 72 hours. Anemia Panel: No  results for input(s): VITAMINB12, FOLATE, FERRITIN, TIBC, IRON, RETICCTPCT in the last 72 hours. Sepsis Labs: No results for input(s): PROCALCITON, LATICACIDVEN in the last 168 hours.  Recent Results (from the past 240 hour(s))  SARS CORONAVIRUS 2 (TAT 6-24 HRS) Nasopharyngeal Nasopharyngeal Swab     Status: None   Collection Time: 05/25/19  1:35 AM   Specimen: Nasopharyngeal Swab  Result Value Ref Range Status   SARS Coronavirus 2 NEGATIVE NEGATIVE Final    Comment: (NOTE) SARS-CoV-2 target nucleic acids are NOT DETECTED. The SARS-CoV-2 RNA is generally detectable in upper and lower respiratory specimens during the acute phase of infection. Negative results do not preclude SARS-CoV-2 infection, do not rule out co-infections with other pathogens, and should not be used as  the sole basis for treatment or other patient management decisions. Negative results must be combined with clinical observations, patient history, and epidemiological information. The expected result is Negative. Fact Sheet for Patients: SugarRoll.be Fact Sheet for Healthcare Providers: https://www.woods-mathews.com/ This test is not yet approved or cleared by the [place] FDA and  has been authorized for detection and/or diagnosis of SARS-CoV-2 by FDA under an Emergency Use Authorization (EUA). This EUA will remain  in effect (meaning this test can be used) for the duration of the COVID-19 declaration under Section 56 4(b)(1) of the Act, 21 U.S.C. section 360bbb-3(b)(1), unless the authorization is terminated or revoked sooner. Performed at [facility], [place] [address], [place], [place] [address]          Radiology Studies: DG Chest 1 View  Result Date: 05/27/2019 CLINICAL DATA:  Status post left thoracentesis EXAM: CHEST  1 VIEW COMPARISON:  05/24/2019 FINDINGS: Interval thoracentesis has been performed on the left with near complete reduction of the left-sided pleural effusion. No pneumothorax is noted. No focal infiltrate is seen. Cardiac shadow is stable. Aortic calcifications are again noted. IMPRESSION: Resolution of left pleural effusion.  No pneumothorax is noted. Electronically Signed   By: Inez Catalina M.D.   On: 05/27/2019 13:48   IR THORACENTESIS ASP PLEURAL SPACE W/IMG GUIDE  Result Date: 05/27/2019 INDICATION: Patient with history of congestive heart failure, now with left pleural effusion. Request is made for therapeutic thoracentesis. EXAM: ULTRASOUND GUIDED THERAPEUTIC THORACENTESIS MEDICATIONS: 10 mL 1% lidocaine COMPLICATIONS: None immediate. PROCEDURE: An ultrasound guided thoracentesis was thoroughly discussed with the patient and questions answered. The benefits, risks, alternatives and complications were also  discussed. The patient understands and wishes to proceed with the procedure. Written consent was obtained. Ultrasound was performed to localize and mark an adequate pocket of fluid in the left chest. The area was then prepped and draped in the normal sterile fashion. 1% Lidocaine was used for local anesthesia. Under ultrasound guidance a 6 Fr Safe-T-Centesis catheter was introduced. Thoracentesis was performed. The catheter was removed and a dressing applied. FINDINGS: A total of approximately 550 mL of yellow fluid was removed. IMPRESSION: Successful ultrasound guided therapeutic thoracentesis yielding 550 mL of pleural fluid. Read by: Brynda Greathouse PA-C Electronically Signed   By: Jerilynn Mages.  Shick M.D.   On: 05/27/2019 13:58        Scheduled Meds: . amiodarone  200 mg Oral BID  . anastrozole  1 mg Oral Daily  . apixaban  2.5 mg Oral BID  . vitamin C  500 mg Oral Daily  . aspirin EC  81 mg Oral Daily  . atorvastatin  40 mg Oral q1800  . fenofibrate  160 mg Oral Daily  . ferrous sulfate  325 mg Oral Daily  . furosemide  40 mg Intravenous Q12H  . hydrALAZINE  25 mg Oral TID  . insulin aspart  0-9 Units Subcutaneous TID WC  . isosorbide mononitrate  30 mg Oral Daily  . levothyroxine  150 mcg Oral QAC breakfast  . magnesium oxide  400 mg Oral Daily   Continuous Infusions:   LOS: 4 days    Time spent:35 mins. More than 50% of that time was spent in counseling and/or coordination of care.      Shelly Coss, MD [facility] P4/12/2019, 7:38 AM

## 2019-05-29 NOTE — Progress Notes (Addendum)
ANTICOAGULATION CONSULT NOTE - Initial Consult  Pharmacy Consult for Heparin Indication: chest pain/ACS  Allergies  Allergen Reactions  . Tape Itching and Rash    Surgical tape from cyst removed    Patient Measurements: Height: 5\' 4"  (162.6 cm) Weight: 75 kg (165 lb 6.4 oz)(scale b) IBW/kg (Calculated) : 54.7 Heparin Dosing Weight: 70.4  Vital Signs: Temp: 98.4 F (36.9 C) (04/11 1135) Temp Source: Oral (04/11 1135) BP: 121/59 (04/11 1149) Pulse Rate: 73 (04/11 0646)  Labs: Recent Labs    05/27/19 0421 05/27/19 0940 05/27/19 1255 05/28/19 0424 05/29/19 0409  HGB 8.6*  --   --   --  8.2*  HCT 27.2*  --   --   --  25.9*  PLT 250  --   --   --  222  CREATININE 2.22*  --   --  2.37* 2.32*  TROPONINIHS  --  235* 254*  --   --     Estimated Creatinine Clearance: 16.6 mL/min (A) (by C-G formula based on SCr of 2.32 mg/dL (H)).   Medical History: Past Medical History:  Diagnosis Date  . Arthritis   . Chronic kidney disease   . Chronic systolic dysfunction of left ventricle   . Coronary artery disease   . Diabetes mellitus without complication ([place])   . Diverticulitis   . Hypertension   . Hypothyroidism   . Myocardial infarction ([place])   . Paroxysmal atrial fibrillation (HCC)   . Thyroid disease     Medications:  Scheduled:  . amiodarone  200 mg Oral BID  . anastrozole  1 mg Oral Daily  . apixaban  2.5 mg Oral BID  . vitamin C  500 mg Oral Daily  . aspirin EC  81 mg Oral Daily  . atorvastatin  40 mg Oral q1800  . ferrous sulfate  325 mg Oral Daily  . [START ON 05/30/2019] furosemide  40 mg Intravenous Daily  . insulin aspart  0-9 Units Subcutaneous TID WC  . isosorbide mononitrate  30 mg Oral Daily  . levothyroxine  150 mcg Oral QAC breakfast  . magnesium oxide  400 mg Oral Daily  . sodium chloride flush  3 mL Intravenous Q12H   Infusions:    Assessment: Pt is a 86 y/o female with PMH of severe MR, PAF, CAD, DM, CKD-IV, breast cancer, anemia who  presents with increasing SOB and edema. Patient was taking apixaban for Afib, which was resumed in the hospital. However, plan is now to take patient for cardiac catherization  Will need to dose based on aptt since patient had recent apixaban exposure. Hg/Hct low but stable at 8.2. Plts WNLS. Last dose of apixaban 4/11 @ 0912.  Goal of Therapy:  Heparin level 0.3-0.7 units/ml aPTT 66-102 seconds Monitor platelets by anticoagulation protocol: Yes   Plan:  Start heparin infusion at 850 units/hr @ 2100 Check aPTT in 8 hours and daily while on heparin Monitor daily HL Continue to monitor H&H and platelets Watch for signs/symptoms of bleeding  Sherren Kerns, PharmD PGY1 Acute Care Pharmacy Resident 05/29/2019,2:59 PM

## 2019-05-29 NOTE — Progress Notes (Signed)
Pt stated 'I have chest pain that wasn't there before. Pinching pain on the left."  EKG done(see result),  BP (!) 148/54   Pulse 73   Temp 98.3 F (36.8 C) (Oral)   Resp 20   Ht 5\' 4"  (1.626 m)   Wt 75 kg Comment: scale b  SpO2 97%   BMI 28.39 kg/m   1 nitro given. After few min pt stated pain was relieved. Page cardio Dr Rolly Pancake. Will monitor.

## 2019-05-29 NOTE — Progress Notes (Addendum)
Progress Note  Patient Name: Lauren Walker Date of Encounter: 05/29/2019  Primary Cardiologist: Jenne Campus, MD  Subjective   Continues to have witnessed symptomatic pauses. Baseline rhythm appeared to be sinus tachycardia today.   Inpatient Medications    Scheduled Meds: . amiodarone  200 mg Oral BID  . anastrozole  1 mg Oral Daily  . apixaban  2.5 mg Oral BID  . vitamin C  500 mg Oral Daily  . aspirin EC  81 mg Oral Daily  . atorvastatin  40 mg Oral q1800  . ferrous sulfate  325 mg Oral Daily  . furosemide  40 mg Intravenous Q12H  . hydrALAZINE  25 mg Oral TID  . insulin aspart  0-9 Units Subcutaneous TID WC  . isosorbide mononitrate  30 mg Oral Daily  . levothyroxine  150 mcg Oral QAC breakfast  . magnesium oxide  400 mg Oral Daily   Continuous Infusions:  PRN Meds: acetaminophen **OR** acetaminophen, lidocaine, nitroGLYCERIN, ondansetron **OR** ondansetron (ZOFRAN) IV   Vital Signs    Vitals:   05/29/19 0450 05/29/19 0646 05/29/19 1135 05/29/19 1149  BP: (!) 138/41 (!) 148/54 130/64 (!) 121/59  Pulse: 72 73    Resp: 20     Temp: 98.1 F (36.7 C) 98.3 F (36.8 C) 98.4 F (36.9 C)   TempSrc: Oral Oral Oral   SpO2: 97% 97% 96%   Weight: 75 kg     Height:        Intake/Output Summary (Last 24 hours) at 05/29/2019 1303 Last data filed at 05/29/2019 1055 Gross per 24 hour  Intake 480 ml  Output 3200 ml  Net -2720 ml   Filed Weights   05/26/19 0500 05/27/19 0104 05/29/19 0450  Weight: 79.2 kg 78.7 kg 75 kg    Physical Exam   GEN: No acute distress.   Neck: No JVD Cardiac: RRR, 3/6 HSM across precordium, no rubs, or gallops.   Respiratory: Clear to auscultation bilaterally. GI: Soft, nontender, non-distended  MS: trace edema, TED hose on; No deformity. Neuro:  Nonfocal  Psych: Normal affect   Labs    Chemistry Recent Labs  Lab 05/25/19 0617 05/26/19 0457 05/27/19 0421 05/28/19 0424 05/29/19 0409  NA 141   < > 140 139 138  K  4.0   < > 3.8 4.0 3.4*  CL 103   < > 100 96* 97*  CO2 26   < > 29 29 30   GLUCOSE 130*   < > 123* 137* 111*  BUN 51*   < > 55* 60* 60*  CREATININE 2.04*   < > 2.22* 2.37* 2.32*  CALCIUM 8.7*   < > 8.7* 9.2 8.6*  PROT 5.5*  --   --   --   --   ALBUMIN 3.1*  --   --   --   --   AST 27  --   --   --   --   ALT 14  --   --   --   --   ALKPHOS 18*  --   --   --   --   BILITOT 0.5  --   --   --   --   GFRNONAA 21*   < > 19* 18* 18*  GFRAA 25*   < > 22* 21* 21*  ANIONGAP 12   < > 11 14 11    < > = values in this interval not displayed.     Hematology Recent  Labs  Lab 05/25/19 0617 05/27/19 0421 05/29/19 0409  WBC 7.5 6.3 5.2  RBC 3.06* 2.84* 2.73*  HGB 9.4* 8.6* 8.2*  HCT 30.0* 27.2* 25.9*  MCV 98.0 95.8 94.9  MCH 30.7 30.3 30.0  MCHC 31.3 31.6 31.7  RDW 14.1 14.0 14.1  PLT 233 250 222    Cardiac EnzymesNo results for input(s): TROPONINI in the last 168 hours. No results for input(s): TROPIPOC in the last 168 hours.   BNP Recent Labs  Lab 05/25/19 0135  BNP 2,812.1*     DDimer No results for input(s): DDIMER in the last 168 hours.   Radiology    DG Chest 1 View  Result Date: 05/27/2019 CLINICAL DATA:  Status post left thoracentesis EXAM: CHEST  1 VIEW COMPARISON:  05/24/2019 FINDINGS: Interval thoracentesis has been performed on the left with near complete reduction of the left-sided pleural effusion. No pneumothorax is noted. No focal infiltrate is seen. Cardiac shadow is stable. Aortic calcifications are again noted. IMPRESSION: Resolution of left pleural effusion.  No pneumothorax is noted. Electronically Signed   By: Inez Catalina M.D.   On: 05/27/2019 13:48   IR THORACENTESIS ASP PLEURAL SPACE W/IMG GUIDE  Result Date: 05/27/2019 INDICATION: Patient with history of congestive heart failure, now with left pleural effusion. Request is made for therapeutic thoracentesis. EXAM: ULTRASOUND GUIDED THERAPEUTIC THORACENTESIS MEDICATIONS: 10 mL 1% lidocaine COMPLICATIONS: None  immediate. PROCEDURE: An ultrasound guided thoracentesis was thoroughly discussed with the patient and questions answered. The benefits, risks, alternatives and complications were also discussed. The patient understands and wishes to proceed with the procedure. Written consent was obtained. Ultrasound was performed to localize and mark an adequate pocket of fluid in the left chest. The area was then prepped and draped in the normal sterile fashion. 1% Lidocaine was used for local anesthesia. Under ultrasound guidance a 6 Fr Safe-T-Centesis catheter was introduced. Thoracentesis was performed. The catheter was removed and a dressing applied. FINDINGS: A total of approximately 550 mL of yellow fluid was removed. IMPRESSION: Successful ultrasound guided therapeutic thoracentesis yielding 550 mL of pleural fluid. Read by: Brynda Greathouse PA-C Electronically Signed   By: Jerilynn Mages.  Shick M.D.   On: 05/27/2019 13:58   Telemetry    SR, afib, sinus tachycardia.  Pauses of 3.5 seconds. - Personally Reviewed   ECG    Sinus tachycardia rate 114 bpm- Personally Reviewed  Cardiac Studies   Echo 05/25/19:  1. Left ventricular ejection fraction, by estimation, is 45 to 50%. The  left ventricle has mildly decreased function. The left ventricle  demonstrates regional wall motion abnormalities (see scoring  diagram/findings for description). There is moderate  left ventricular hypertrophy. Left ventricular diastolic parameters are  indeterminate. There is moderate hypokinesis of the left ventricular,  entire inferior wall.  2. Right ventricular systolic function is normal. The right ventricular  size is normal. There is normal pulmonary artery systolic pressure.  3. Left atrial size was severely dilated.  4. Large pleural effusion in the left lateral region.  5. The mitral valve is grossly normal. Moderate to severe mitral valve  regurgitation.  6. The aortic valve is tricuspid. Aortic valve regurgitation is  not  visualized. Mild to moderate aortic valve stenosis. Aortic valve area, by  VTI measures 1.49 cm. Aortic valve mean gradient measures 10.4 mmHg.  Aortic valve Vmax measures 2.17 m/s.  7. The inferior vena cava is dilated in size with >50% respiratory  variability, suggesting right atrial pressure of 8 mmHg.   Comparison(s):  02/16/19: LVEF 60-65%, severe MR, severe LAE.   Echo 02/16/2019:  1. Left ventricular ejection fraction, by visual estimation, is 60 to  65%. is mildly increased left ventricular hypertrophy.  2. Left ventricular diastolic parameters are indeterminate.  3. Mildly dilated left ventricular internal cavity size.  4. Global right ventricle has normal systolic function.The right  ventricular size is normal. No increase in right ventricular wall  thickness.  5. Left atrial size was severely dilated.  6. Right atrial size was normal.  7. Mild mitral valve prolapse.  8. Severe mitral valve regurgitation. No evidence of mitral stenosis.  9. The tricuspid valve is normal in structure.  10. The aortic valve is normal in structure. Aortic valve regurgitation is  not visualized. Mild aortic valve stenosis.  11. The pulmonic valve was normal in structure. Pulmonic valve  regurgitation is not visualized.  12. There is dilatation of the ascending aorta measuring 38 mm.  13. Mildly elevated pulmonary artery systolic pressure.  14. The inferior vena cava is normal in size with greater than 50%  respiratory variability, suggesting right atrial pressure of 3 mmHg.  15. Severe MR with jet directed posteriorly. Minimal prolaps of the middle  segment of the anterior leaflet of the mitral valve noted A2.   Patient Profile     86 y.o. female with history of CAD (cath 2016 elsewhere, no report available, was told 1 occluded artery with collateral managed medically),paroxysmal atrial fibrillation,chronic diastolic CHF,severe mitral regurgitation,mild AS,CKDstage  IV,anemia,type 2 diabetes mellitus,hypertension, hyperlipidemia, hypothyroidism, breast CA. She was seen by Dr. Agustin Cree via telemedicine in 03/2019 who discussed options for surgery for her mitral valve disease including open heart surgery vs MitraClip. She had recently been diagnosed with breast CA so wanted to postpone any intervention until breast cancer plan. Per oncology she has wished to defer surgery and utilize anastrazole instead. She has also recently been followed for anemia felt due to renal disease. She presented to [facility] with increasing SOB and edema. She was found to have elevated BNP, left sided pleural effusion and elevated troponins.  Assessment & Plan    Symptomatic post termination pause, 6 seconds Tachy brady syndrome Paroxysmal atrial fibrillation - rapid atrial fibrillation while off of carvedilol, due to sinus bradycardia, with long post termination pauses that are symptomatic.  EP has seen. - now on amiodarone 200 mg BID to attempt to suppress afib and avoid conversion pauses.  - Today, apparent sinus tachycardia with intermittent symptomatic bradycardia and pauses of 3.5 seconds.   Acute on chronic diastolic CHF with left pleural effusion: S/p thoracentesis with 550 cc out 4/9. Recurrence of band like chest pain. Repeat cxr shows small left pleural effusion.  Cr: 2.37 > 2.32 UOP yesterday: 2.65 L Weight: 175>174>174>173>165 lb Net negative for admission: -5 L Diuresis plan: lasix 40 mg IV today, likely to oral tomorrow.   Chest pain Developed chest pain this morning. Nitro responsive. We discussed that initial presentation suggestive of demand ischemia with known medically managed CAD and flat troponin trend, though she did have RWMA, with chest pain after diuresis, need to exclude obstructive CAD. Consider coronary angiography tomorrow. May need to discuss with patient again possibility of renal impairment from contrast. INFORMED CONSENT: I have reviewed the  risks, indications, and alternatives to cardiac catheterization, possible angioplasty, and stenting with the patient. Risks include but are not limited to bleeding, infection, vascular injury, stroke, myocardial infection, arrhythmia, kidney injury, radiation-related injury in the case of prolonged fluoroscopy use, emergency cardiac surgery,  and death. The patient understands the risks of serious complication is 1-2 in 0379 with diagnostic cardiac cath and 1-2% or less with angioplasty/stenting.   Severe mitral regurgitation with mild aortic stenosis: -Repeat TTE 05/25/19 with moderate to severe MR, mild to moderate AI - appears to have primary mitral valve regurgitation. We have briefly discussed a workup for mitraclip, she is undecided on pursuing this, will discuss further over days to come.  - if TEE performed, consider TEE with structural imager for mitraclip evaluation.  - she tells me at her age she does not want an open surgery if a candidate.   Elevated troponin with known CAD: -Initial elevation felt to be secondary to demand ischemia from elevated LV filling pressures however cannot exclude underlying ACS given degree of elevation>>trend is however flat (764>858>702>637>576) -Does have reports of a bandlike sensation around her bilateral lower breast, but this improved after thoracentesis. -May benefit from further ischemic work-up while inpatient, patient has chosen medical management in the past. Renal function somewhat prohibitive given patient's preference for no dialysis. Challenging situation however now with chest pain.  -Continue ASA, BB, statin and Imdur -On Eliquis, not on heparin gtt, will hold DOAC for cath.   CKD Stage IV: -Creatinine today, 2.32 -BMET in AM daily  [name], MD 05/29/19 1:03 PM  I was at the bedside with the patient for approximately 60 minutes assessing chest pain, obtaining and reviewing chest xray, ecg and telemetry during 2 episodes of  symptomatic bradycardia requiring cardiology MD review.

## 2019-05-30 DIAGNOSIS — I10 Essential (primary) hypertension: Secondary | ICD-10-CM

## 2019-05-30 DIAGNOSIS — I5033 Acute on chronic diastolic (congestive) heart failure: Secondary | ICD-10-CM

## 2019-05-30 DIAGNOSIS — I25119 Atherosclerotic heart disease of native coronary artery with unspecified angina pectoris: Secondary | ICD-10-CM

## 2019-05-30 DIAGNOSIS — I5021 Acute systolic (congestive) heart failure: Secondary | ICD-10-CM

## 2019-05-30 LAB — BASIC METABOLIC PANEL
Anion gap: 11 (ref 5–15)
BUN: 61 mg/dL — ABNORMAL HIGH (ref 8–23)
CO2: 29 mmol/L (ref 22–32)
Calcium: 8.8 mg/dL — ABNORMAL LOW (ref 8.9–10.3)
Chloride: 99 mmol/L (ref 98–111)
Creatinine, Ser: 2.49 mg/dL — ABNORMAL HIGH (ref 0.44–1.00)
GFR calc Af Amer: 19 mL/min — ABNORMAL LOW (ref >60)
GFR calc non Af Amer: 17 mL/min — ABNORMAL LOW (ref >60)
Glucose, Bld: 105 mg/dL — ABNORMAL HIGH (ref 70–99)
Potassium: 3.9 mmol/L (ref 3.5–5.1)
Sodium: 139 mmol/L (ref 135–145)

## 2019-05-30 LAB — CBC
HCT: 27.3 % — ABNORMAL LOW (ref 36.0–46.0)
Hemoglobin: 8.7 g/dL — ABNORMAL LOW (ref 12.0–15.0)
MCH: 30 pg (ref 26.0–34.0)
MCHC: 31.9 g/dL (ref 30.0–36.0)
MCV: 94.1 fL (ref 80.0–100.0)
Platelets: 241 10*3/uL (ref 150–400)
RBC: 2.9 MIL/uL — ABNORMAL LOW (ref 3.87–5.11)
RDW: 13.9 % (ref 11.5–15.5)
WBC: 5.7 10*3/uL (ref 4.0–10.5)
nRBC: 0 % (ref 0.0–0.2)

## 2019-05-30 LAB — GLUCOSE, CAPILLARY
Glucose-Capillary: 107 mg/dL — ABNORMAL HIGH (ref 70–99)
Glucose-Capillary: 164 mg/dL — ABNORMAL HIGH (ref 70–99)
Glucose-Capillary: 129 mg/dL — ABNORMAL HIGH (ref 70–99)
Glucose-Capillary: 170 mg/dL — ABNORMAL HIGH (ref 70–99)

## 2019-05-30 LAB — HEPARIN LEVEL (UNFRACTIONATED): Heparin Unfractionated: 2.04 IU/mL — ABNORMAL HIGH (ref 0.30–0.70)

## 2019-05-30 LAB — APTT: aPTT: 66 seconds — ABNORMAL HIGH (ref 24–36)

## 2019-05-30 MED ORDER — ISOSORBIDE MONONITRATE ER 30 MG PO TB24
30.0000 mg | ORAL_TABLET | Freq: Once | ORAL | Status: AC
  Administered 2019-05-30: 30 mg via ORAL
  Filled 2019-05-30: qty 1

## 2019-05-30 MED ORDER — TORSEMIDE 20 MG PO TABS
20.0000 mg | ORAL_TABLET | Freq: Every day | ORAL | Status: DC
  Administered 2019-05-30 – 2019-05-31 (×2): 20 mg via ORAL
  Filled 2019-05-30 (×2): qty 1

## 2019-05-30 MED ORDER — ASPIRIN 81 MG PO CHEW
81.0000 mg | CHEWABLE_TABLET | ORAL | Status: AC
  Administered 2019-05-30: 81 mg via ORAL
  Filled 2019-05-30: qty 1

## 2019-05-30 MED ORDER — ISOSORBIDE MONONITRATE ER 60 MG PO TB24
60.0000 mg | ORAL_TABLET | Freq: Every day | ORAL | Status: DC
  Administered 2019-05-31: 09:00:00 60 mg via ORAL
  Filled 2019-05-30: qty 1

## 2019-05-30 NOTE — Progress Notes (Signed)
Cath Lab called regarding Cath schedule. Pt not on schedule, but will be an add-on for today. Pt remains NPO.

## 2019-05-30 NOTE — Progress Notes (Addendum)
Progress Note  Patient Name: Lauren Walker Date of Encounter: 05/30/2019  Primary Cardiologist: Jenne Campus, MD   Subjective   Feels much better then a few days ago, no rest SOB, no active CP.  She can tell when she is in AF, nad feels momentary lightheaded/dizzy when it stops NO reports of syncope. Says she has felt this before, some years ago, had not in quite a long time until here  Inpatient Medications    Scheduled Meds:  amiodarone  200 mg Oral BID   anastrozole  1 mg Oral Daily   vitamin C  500 mg Oral Daily   aspirin EC  81 mg Oral Daily   atorvastatin  40 mg Oral q1800   ferrous sulfate  325 mg Oral Daily   insulin aspart  0-9 Units Subcutaneous TID WC   isosorbide mononitrate  30 mg Oral Daily   levothyroxine  150 mcg Oral QAC breakfast   magnesium oxide  400 mg Oral Daily   sodium chloride flush  3 mL Intravenous Q12H   torsemide  20 mg Oral Daily   Continuous Infusions:  sodium chloride 10 mL/hr at 05/30/19 0600   heparin 850 Units/hr (05/30/19 0600)   PRN Meds: sodium chloride, acetaminophen **OR** acetaminophen, lidocaine, nitroGLYCERIN, ondansetron **OR** ondansetron (ZOFRAN) IV   Vital Signs    Vitals:   05/29/19 1923 05/30/19 0036 05/30/19 0323 05/30/19 0807  BP: (!) 144/48 (!) 167/72 (!) 178/72 (!) 157/46  Pulse: 60 78 73 (!) 54  Resp: 18 18 18    Temp: 99 F (37.2 C) 98.1 F (36.7 C) 98.8 F (37.1 C) 99 F (37.2 C)  TempSrc: Oral Oral Oral Oral  SpO2: 100% 98% 97% 99%  Weight:  75 kg    Height:        Intake/Output Summary (Last 24 hours) at 05/30/2019 0927 Last data filed at 05/30/2019 0658 Gross per 24 hour  Intake 680.32 ml  Output 2250 ml  Net -1569.68 ml   Last 3 Weights 05/30/2019 05/29/2019 05/29/2019  Weight (lbs) 165 lb 4.8 oz 165 lb 5.5 oz 165 lb 6.4 oz  Weight (kg) 74.98 kg 75 kg 75.025 kg      Telemetry    In/out of what looks a AFlutter/ATach yesterday 110's-120's and SR 60's with post termination pauses, since  about 1300 yesterday has maintained SR 60's so far - Personally Reviewed  ECG    SR 68bpm, PVC, measured QT 411ms, QTc is OK - Personally Reviewed  Physical Exam   GEN: No acute distress.   Neck: No JVD Cardiac: RRR, 2/6 SM, rubs, or gallops.  Respiratory: CTA b/l, normal WOB. GI: Soft, nontender, non-distended  MS: 1++ edema; No deformity. Neuro:  Nonfocal  Psych: Normal affect   Labs    High Sensitivity Troponin:   Recent Labs  Lab 05/25/19 0617 05/25/19 0918 05/25/19 1221 05/27/19 0940 05/27/19 1255  TROPONINIHS 702* 637* 576* 235* 254*      Chemistry Recent Labs  Lab 05/25/19 0617 05/26/19 0457 05/27/19 0421 05/28/19 0424 05/29/19 0409  NA 141   < > 140 139 138  K 4.0   < > 3.8 4.0 3.4*  CL 103   < > 100 96* 97*  CO2 26   < > 29 29 30   GLUCOSE 130*   < > 123* 137* 111*  BUN 51*   < > 55* 60* 60*  CREATININE 2.04*   < > 2.22* 2.37* 2.32*  CALCIUM 8.7*   < >  8.7* 9.2 8.6*  PROT 5.5*  --   --   --   --   ALBUMIN 3.1*  --   --   --   --   AST 27  --   --   --   --   ALT 14  --   --   --   --   ALKPHOS 18*  --   --   --   --   BILITOT 0.5  --   --   --   --   GFRNONAA 21*   < > 19* 18* 18*  GFRAA 25*   < > 22* 21* 21*  ANIONGAP 12   < > 11 14 11    < > = values in this interval not displayed.     Hematology Recent Labs  Lab 05/27/19 0421 05/29/19 0409 05/30/19 0527  WBC 6.3 5.2 5.7  RBC 2.84* 2.73* 2.90*  HGB 8.6* 8.2* 8.7*  HCT 27.2* 25.9* 27.3*  MCV 95.8 94.9 94.1  MCH 30.3 30.0 30.0  MCHC 31.6 31.7 31.9  RDW 14.0 14.1 13.9  PLT 250 222 241    BNP Recent Labs  Lab 05/25/19 0135  BNP 2,812.1*     DDimer No results for input(s): DDIMER in the last 168 hours.   Radiology    DG Chest Port 1 View  Result Date: 05/29/2019 CLINICAL DATA:  Pleural effusion.  Shortness of breath. EXAM: PORTABLE CHEST 1 VIEW COMPARISON:  05/27/2019 FINDINGS: Lungs are adequately inflated without focal airspace consolidation or effusion. Cardiomediastinal  silhouette, bones and soft tissues are unchanged. IMPRESSION: No acute disease. Electronically Signed   By: Marin Olp M.D.   On: 05/29/2019 15:27    Cardiac Studies   Echo 05/25/19:   1. Left ventricular ejection fraction, by estimation, is 45 to 50%. The  left ventricle has mildly decreased function. The left ventricle  demonstrates regional wall motion abnormalities (see scoring  diagram/findings for description). There is moderate  left ventricular hypertrophy. Left ventricular diastolic parameters are  indeterminate. There is moderate hypokinesis of the left ventricular,  entire inferior wall.   2. Right ventricular systolic function is normal. The right ventricular  size is normal. There is normal pulmonary artery systolic pressure.   3. Left atrial size was severely dilated.   4. Large pleural effusion in the left lateral region.   5. The mitral valve is grossly normal. Moderate to severe mitral valve  regurgitation.   6. The aortic valve is tricuspid. Aortic valve regurgitation is not  visualized. Mild to moderate aortic valve stenosis. Aortic valve area, by  VTI measures 1.49 cm. Aortic valve mean gradient measures 10.4 mmHg.  Aortic valve Vmax measures 2.17 m/s.   7. The inferior vena cava is dilated in size with >50% respiratory  variability, suggesting right atrial pressure of 8 mmHg.   Comparison(s): 02/16/19: LVEF 60-65%, severe MR, severe LAE.    Echo 02/16/2019:   1. Left ventricular ejection fraction, by visual estimation, is 60 to  65%. is mildly increased left ventricular hypertrophy.   2. Left ventricular diastolic parameters are indeterminate.   3. Mildly dilated left ventricular internal cavity size.   4. Global right ventricle has normal systolic function.The right  ventricular size is normal. No increase in right ventricular wall  thickness.   5. Left atrial size was severely dilated.   6. Right atrial size was normal.   7. Mild mitral valve prolapse.    8. Severe mitral valve regurgitation.  No evidence of mitral stenosis.   9. The tricuspid valve is normal in structure.  10. The aortic valve is normal in structure. Aortic valve regurgitation is  not visualized. Mild aortic valve stenosis.  11. The pulmonic valve was normal in structure. Pulmonic valve  regurgitation is not visualized.  12. There is dilatation of the ascending aorta measuring 38 mm.  13. Mildly elevated pulmonary artery systolic pressure.  14. The inferior vena cava is normal in size with greater than 50%  respiratory variability, suggesting right atrial pressure of 3 mmHg.  15. Severe MR with jet directed posteriorly. Minimal prolaps of the middle  segment of the anterior leaflet of the mitral valve noted A2.   Patient Profile     86 y.o. female w/PMHx CAD(unknown details), chronic CHF (diastolic/combined), CKD (IV), DM, HTN, HLD, breast Ca (pt declined surgery and being treated with anastrazole), anemia of chronic disease, severe MR, hypothyroidism, PAFib  Admitted w/increasing SOB, edema, CHF exacerbation, AFib w/RVR  Had 6 second post termination pause after IV lopressor, EP was called to the case  Assessment & Plan    1. Tachy-brady     Saw by Dr. Rayann Heman Friday, started on PO amiodarone in hopes if able to maintain SR, post termination pauses would not be an issue.     amio 200mg  BID, 4th dose this AM only     She had intermittent tachy/post termination pauses (3-3.5 seconds) yesterday afternoon, though has settled to SR so far since then      She has severe MR, currently no plans for repair/replacement, possibly a clip pending outpatient referral to structual heart team LA described as severely enlarged, measured 4.0cm  maintaining SR may prove challenging without fixing her MR, will follow as amio loads  2. CP     After discussion with general cardiology, no plans for cath after talking with pt/renal risks     Continue with gen cardiology team, planned for  conservative/medical maagement of her CAD       For questions or updates, please contact [facility] Please consult [email or web address] for contact info under     Signed, Baldwin Jamaica, PA-C  05/30/2019, 9:27 AM    EP Attending  Patient seen and examined. Agree with the findings as noted above. The patient has had a good day. She has had neither rapid atrial fib or pauses while on amiodarone. Continue Amio. No plans for PPM insertion at this point in time.   Mikle Bosworth.D.

## 2019-05-30 NOTE — Progress Notes (Signed)
PROGRESS NOTE    Lauren Walker  ZRA:[id number] DOB: January 26, 1930 DOA: 05/24/2019 PCP: Midge Minium, MD   Brief Narrative: Patient is 86 year old female with history of severe mitral regurgitation, paroxysmal A. fib, coronary artery disease, CKD stage IV, diabetes type 2, hypothyroidism, breast cancer, chronic anemia who presents to the emergency room with complaint of shortness of breath.  She was also having increased bilateral lower extremity edema over few weeks and became acutely short of breath a day before admission.  On presentation, x-ray of the chest showed moderate left pleural effusion with possible infiltrates likely due to pulmonary edema.  Troponin was elevated.  Elevated BNP.  Cardiology consulted for evaluation of acute congestive heart failure with possible non-STEMI.  She has been started on IV Lasix.  EP also consulted for concern of pauses/bradycardia.  There was plan for cardiac cath but the plan has been changed for medical management.  Assessment & Plan:   Principal Problem:   Acute CHF (congestive heart failure) (HCC) Active Problems:   Hypothyroid   HTN (hypertension)   CAD (coronary artery disease)   Mitral regurgitation moderate to severe based on echocardiogram in summer 2020   Malignant neoplasm of upper-outer quadrant of left breast in female, estrogen receptor positive ([place])   CKD stage G4/A1, GFR 15-29 and albumin creatinine ratio <30 mg/g (HCC)   Anemia associated with stage 4 chronic renal failure (HCC)   Non-ST elevation MI (NSTEMI) (HCC)   Acute systolic CHF: Presented with volume overload, bilateral lower extremity edema, shortness of breath, chest pain.  Echocardiogram done here showed ejection fraction of 40 to 45%, regional wall motion abnormality in the left ventricle, moderate LVH. She was on IV Lasix 40 mg IV twice daily now changed to torsemide 20 mg daily .cardiology following.  Monitor input/output, daily weight.  Moderate/severe mitral  regurgitation: As per echo.   Cardiology following  Chest pain/elevated troponin/history of coronary artery disease: EKG did not show any ischemic changes.  Elevated troponin could be associated with congestive heart failure but echo has shown regional wall motion abnormality in the left ventricle.   On aspirin at home.  There was plan for cardiac cath which has now been canceled due to her CKD, advanced age.  Plan for medical management.  Left-sided pleural effusion :She was complaining of lower chest tightness on both sides today.  Chest x-ray had shown moderate left-sided pleural effusion. S/P US thoracentesis with removal of 550 mL of yellow fluid.  CKD stage IIIb: Baseline creatinine ranges from 2-2.5.  Currently kidney function at baseline.  Monitor closely.  Hypertension: Currently blood pressure stable.  Continue current medications.  She is on Imdur.  Paroxysmal A. Fib/pauses/bradycardia: On Eliquis for anticoagulation.  Currently rate is controlled. She was on carvedilol at home which is on hold due to concern for bradycardia/pauses.  EP consulted.  Started on amiodarone. Possible consideration of pacemaker placement.  Currently she is on heparin drip for possible procedures.  Left breast cancer: Estrogen receptor positive.  Follows with Dr. Jana Hakim.  On anastrozole.  Hyperlipidemia: On Lipitor  Hypothyroidism: On Synthyroid  Normocytic anemia: Most likely associated with CKD.  Currently hemoglobin stable.   Hypokalemia: Supplemented  Prolonged QTC: QTC of 499.  We will continue to monitor.           DVT prophylaxis:Eliquis Code Status: DNR Family Communication: Called daughter on phone on 05/30/2019.  Call not received. Disposition Plan: Patient is from home.  Needs cardiology clearance before discharge.  Cardiology  planning for possible pace maker placement.  Consultants: Cardiology  Procedures: None  Antimicrobials:  Anti-infectives (From admission, onward)    None      Subjective: Patient seen and examined at the bedside this morning.  Hemodynamically stable.  Sitting on the chair.  Denies any new complaints.  EKG monitor showed bradycardia in the range of 50s.   Objective: Vitals:   05/29/19 1654 05/29/19 1923 05/30/19 0036 05/30/19 0323  BP: (!) 144/53 (!) 144/48 (!) 167/72 (!) 178/72  Pulse: (!) 58 60 78 73  Resp:  18 18 18   Temp: 98.3 F (36.8 C) 99 F (37.2 C) 98.1 F (36.7 C) 98.8 F (37.1 C)  TempSrc: Oral Oral Oral Oral  SpO2: 99% 100% 98% 97%  Weight:   75 kg   Height:        Intake/Output Summary (Last 24 hours) at 05/30/2019 0721 Last data filed at 05/30/2019 0658 Gross per 24 hour  Intake 920.32 ml  Output 2650 ml  Net -1729.68 ml   Filed Weights   05/29/19 0450 05/29/19 1500 05/30/19 0036  Weight: 75 kg 75 kg 75 kg    Examination:    General exam: Comfortable Respiratory system: Bilateral equal air entry, normal vesicular breath sounds, no wheezes or crackles  Cardiovascular system:Sinus rhythm, Bradycardia No JVD, murmurs, rubs, gallops or clicks. Gastrointestinal system: Abdomen is nondistended, soft and nontender. No organomegaly or masses felt. Normal bowel sounds heard. Central nervous system: Alert and oriented. No focal neurological deficits. Extremities: 2+ bilateral lower extremity edema edema, no clubbing ,no cyanosis Skin: No rashes, lesions or ulcers,no icterus ,no pallor   Data Reviewed: I have personally reviewed following labs and imaging studies  CBC: Recent Labs  Lab 05/24/19 2036 05/25/19 0617 05/27/19 0421 05/29/19 0409 05/30/19 0527  WBC 7.2 7.5 6.3 5.2 5.7  NEUTROABS  --  5.4 3.9 2.8  --   HGB 9.5* 9.4* 8.6* 8.2* 8.7*  HCT 31.1* 30.0* 27.2* 25.9* 27.3*  MCV 98.4 98.0 95.8 94.9 94.1  PLT 267 233 250 222 355   Basic Metabolic Panel: Recent Labs  Lab 05/25/19 0617 05/26/19 0457 05/27/19 0421 05/28/19 0424 05/29/19 0409  NA 141 140 140 139 138  K 4.0 4.0 3.8 4.0 3.4*    CL 103 102 100 96* 97*  CO2 26 28 29 29 30   GLUCOSE 130* 112* 123* 137* 111*  BUN 51* 54* 55* 60* 60*  CREATININE 2.04* 2.22* 2.22* 2.37* 2.32*  CALCIUM 8.7* 8.5* 8.7* 9.2 8.6*  MG  --   --   --   --  2.1   GFR: Estimated Creatinine Clearance: 16.6 mL/min (A) (by C-G formula based on SCr of 2.32 mg/dL (H)). Liver Function Tests: Recent Labs  Lab 05/25/19 0617  AST 27  ALT 14  ALKPHOS 18*  BILITOT 0.5  PROT 5.5*  ALBUMIN 3.1*   No results for input(s): LIPASE, AMYLASE in the last 168 hours. No results for input(s): AMMONIA in the last 168 hours. Coagulation Profile: No results for input(s): INR, PROTIME in the last 168 hours. Cardiac Enzymes: No results for input(s): CKTOTAL, CKMB, CKMBINDEX, TROPONINI in the last 168 hours. BNP (last 3 results) No results for input(s): PROBNP in the last 8760 hours. HbA1C: No results for input(s): HGBA1C in the last 72 hours. CBG: Recent Labs  Lab 05/29/19 0617 05/29/19 1154 05/29/19 1625 05/29/19 2122 05/30/19 0609  GLUCAP 101* 131* 127* 224* 107*   Lipid Profile: No results for input(s): CHOL, HDL, LDLCALC, TRIG,  CHOLHDL, LDLDIRECT in the last 72 hours. Thyroid Function Tests: No results for input(s): TSH, T4TOTAL, FREET4, T3FREE, THYROIDAB in the last 72 hours. Anemia Panel: No results for input(s): VITAMINB12, FOLATE, FERRITIN, TIBC, IRON, RETICCTPCT in the last 72 hours. Sepsis Labs: No results for input(s): PROCALCITON, LATICACIDVEN in the last 168 hours.  Recent Results (from the past 240 hour(s))  SARS CORONAVIRUS 2 (TAT 6-24 HRS) Nasopharyngeal Nasopharyngeal Swab     Status: None   Collection Time: 05/25/19  1:35 AM   Specimen: Nasopharyngeal Swab  Result Value Ref Range Status   SARS Coronavirus 2 NEGATIVE NEGATIVE Final    Comment: (NOTE) SARS-CoV-2 target nucleic acids are NOT DETECTED. The SARS-CoV-2 RNA is generally detectable in upper and lower respiratory specimens during the acute phase of infection.  Negative results do not preclude SARS-CoV-2 infection, do not rule out co-infections with other pathogens, and should not be used as the sole basis for treatment or other patient management decisions. Negative results must be combined with clinical observations, patient history, and epidemiological information. The expected result is Negative. Fact Sheet for Patients: SugarRoll.be Fact Sheet for Healthcare Providers: https://www.woods-mathews.com/ This test is not yet approved or cleared by the [place] FDA and  has been authorized for detection and/or diagnosis of SARS-CoV-2 by FDA under an Emergency Use Authorization (EUA). This EUA will remain  in effect (meaning this test can be used) for the duration of the COVID-19 declaration under Section 56 4(b)(1) of the Act, 21 U.S.C. section 360bbb-3(b)(1), unless the authorization is terminated or revoked sooner. Performed at [facility], [place] [address] [place], [place] [address]          Radiology Studies: DG Chest Port 1 View  Result Date: 05/29/2019 CLINICAL DATA:  Pleural effusion.  Shortness of breath. EXAM: PORTABLE CHEST 1 VIEW COMPARISON:  05/27/2019 FINDINGS: Lungs are adequately inflated without focal airspace consolidation or effusion. Cardiomediastinal silhouette, bones and soft tissues are unchanged. IMPRESSION: No acute disease. Electronically Signed   By: Marin Olp M.D.   On: 05/29/2019 15:27        Scheduled Meds: . amiodarone  200 mg Oral BID  . anastrozole  1 mg Oral Daily  . vitamin C  500 mg Oral Daily  . aspirin EC  81 mg Oral Daily  . atorvastatin  40 mg Oral q1800  . ferrous sulfate  325 mg Oral Daily  . furosemide  40 mg Intravenous Daily  . insulin aspart  0-9 Units Subcutaneous TID WC  . isosorbide mononitrate  30 mg Oral Daily  . levothyroxine  150 mcg Oral QAC breakfast  . magnesium oxide  400 mg Oral Daily  . sodium chloride flush  3 mL  Intravenous Q12H   Continuous Infusions: . sodium chloride 10 mL/hr at 05/30/19 0600  . heparin 850 Units/hr (05/30/19 0600)     LOS: 5 days    Time spent:35 mins. More than 50% of that time was spent in counseling and/or coordination of care.      Shelly Coss, MD [facility] P4/01/2020, 7:21 AM

## 2019-05-30 NOTE — Progress Notes (Signed)
Physical Therapy Treatment Patient Details Name: Lauren Walker MRN: [id number] DOB: June 10, 1929 Today's Date: 05/30/2019    History of Present Illness Pt is 86 yo female who presented to [facility] with increasing SOB and edema. She was found to have elevated BNP, left sided pleural effusion and elevated troponins(which are trending downward).  Admitted for acute CHF with pleural effusion. PMHx: DMT2, Afib, CAD, CKD stage IV, Breast CA.    PT Comments    Pt is sitting EoB on entry agreeable to ambulation with therapy, but expresses concern with the different procedures her doctors are suggesting. Pt admits being a little overwhelmed. Pt is limited in safe mobility by decreased endurance. Pt is supervision for transfers and ambulation of 300 feet with RW and 1x standing rest break. D/c plans remain appropriate. PT will continue to follow acutely.   Follow Up Recommendations  No PT follow up     Equipment Recommendations  None recommended by PT       Precautions / Restrictions Restrictions Weight Bearing Restrictions: No    Mobility  Bed Mobility               General bed mobility comments: sitting EoB on entry   Transfers Overall transfer level: Needs assistance Equipment used: None Transfers: Sit to/from Stand Sit to Stand: Supervision         General transfer comment: supervision for safety   Ambulation/Gait Ambulation/Gait assistance: Supervision Gait Distance (Feet): 300 Feet Assistive device: Rolling walker (2 wheeled) Gait Pattern/deviations: Decreased stride length;Decreased stance time - right;Trendelenburg Gait velocity: WFL Gait velocity interpretation: >2.62 ft/sec, indicative of community ambulatory General Gait Details: supervision for safety with ambulation, steady Trendelenburg gait, 1x standing rest break during ambulation due to 3/4 DoE       Balance Overall balance assessment: Needs assistance   Sitting balance-Leahy Scale: Normal        Standing balance-Leahy Scale: Good                              Cognition Arousal/Alertness: Awake/alert Behavior During Therapy: WFL for tasks assessed/performed Overall Cognitive Status: Within Functional Limits for tasks assessed                                           General Comments General comments (skin integrity, edema, etc.): VSS on RA, pt voices concern about the different procedures that her different doctors are suggesting      Pertinent Vitals/Pain Pain Assessment: No/denies pain           PT Goals (current goals can now be found in the care plan section) Acute Rehab PT Goals Patient Stated Goal: to go home PT Goal Formulation: With patient/family Time For Goal Achievement: 06/08/19 Potential to Achieve Goals: Good Progress towards PT goals: Progressing toward goals    Frequency    Min 3X/week      PT Plan Current plan remains appropriate       AM-PAC PT "6 Clicks" Mobility   Outcome Measure  Help needed turning from your back to your side while in a flat bed without using bedrails?: None Help needed moving from lying on your back to sitting on the side of a flat bed without using bedrails?: None Help needed moving to and from a bed to a chair (including a wheelchair)?: None Help needed  standing up from a chair using your arms (e.g., wheelchair or bedside chair)?: None Help needed to walk in hospital room?: None Help needed climbing 3-5 steps with a railing? : A Little 6 Click Score: 23    End of Session Equipment Utilized During Treatment: Gait belt Activity Tolerance: Patient tolerated treatment well Patient left: in chair;with call bell/phone within reach Nurse Communication: Mobility status PT Visit Diagnosis: Other abnormalities of gait and mobility (R26.89)     Time: 1916-6060 PT Time Calculation (min) (ACUTE ONLY): 22 min  Charges:  $Gait Training: 8-22 mins                     Terrie Haring B. Migdalia Dk PT, DPT Acute Rehabilitation Services Pager [phone] Office [phone]    [place] 05/30/2019, 9:55 AM

## 2019-05-30 NOTE — Progress Notes (Signed)
[place] for Heparin Indication: chest pain/ACS  Allergies  Allergen Reactions  . Tape Itching and Rash    Surgical tape from cyst removed    Patient Measurements: Height: 5\' 4"  (162.6 cm) Weight: 75 kg (165 lb 4.8 oz)(scale b) IBW/kg (Calculated) : 54.7 Heparin Dosing Weight: 70.4  Vital Signs: Temp: 98.8 F (37.1 C) (04/12 0323) Temp Source: Oral (04/12 0323) BP: 178/72 (04/12 0323) Pulse Rate: 73 (04/12 0323)  Labs: Recent Labs    05/27/19 0940 05/27/19 1255 05/28/19 0424 05/29/19 0409 05/29/19 1522 05/30/19 0527  HGB  --   --   --  8.2*  --  8.7*  HCT  --   --   --  25.9*  --  27.3*  PLT  --   --   --  222  --  241  APTT  --   --   --   --  29 66*  HEPARINUNFRC  --   --   --   --  >2.20* 2.04*  CREATININE  --   --  2.37* 2.32*  --   --   TROPONINIHS 235* 254*  --   --   --   --     Estimated Creatinine Clearance: 16.6 mL/min (A) (by C-G formula based on SCr of 2.32 mg/dL (H)).    Assessment: 86 y.o. female admitted with chest pain, h/o Afib and Eliquis on hold, for heparin  Goal of Therapy:  Heparin level 0.3-0.7 units/ml aPTT 66-102 seconds Monitor platelets by anticoagulation protocol: Yes   Plan:  Continue Heparin at current rate  F/U plan for cath  .Phillis Knack, PharmD, BCPS  05/30/2019,7:19 AM

## 2019-05-30 NOTE — Progress Notes (Signed)
Progress Note  Patient Name: Lauren Walker Date of Encounter: 05/30/2019  Primary Cardiologist: Jenne Campus, MD   Subjective   We spent 45 minutes together this AM reviewing her history and options for management. She reports being told she had an MI at [facility] in [place], [name]. I was able to request these records through [facility]. Per the discharge summary, she had an RCA STEMI that was a late presentation (>72 hours). Given her timing, symptoms, and CKD, election was for nonemergent cath. The discharge summary lists occluded proximal RCA with mild-moderate nonobstructive CAD in other vessels. Echo noted normal EF. Troponin values were noted to suggest mild to moderate infarct size.  She had not had frequent issues with chest pain at home, and the chest pain she had yesterday morning was the longest episode she has had. This was nitro responsive.   We spent time today discussing heart cath, nuclear stress test, and medical management. We talked about the cath procedure, risk of contrast nephropathy, and potential benefits. We also discussed nuclear stress test. With her history, I would not be surprised if she has inferior infarct/peri-infarct ischemia, but any defects in the LAD/LCx region would be new. This would ultimately lead to cath if severely abnormal, but if normal may suggest that a cath is not indicated. She cannot walk on a treadmill, so the issue would be lexiscan. With her significant pauses, this is concerning to me, though on my review most pauses appear to be related to atrial fib ->sinus rhythm.  We also discussed medical management. She has a history of diverticulitis last year with GI bleed, and she already bruises easily on aspirin and apixaban. We discussed that stent placement would require an adjustment to her regimen and may make her more prone to bruising/bleeding in the short term.  After prolonged discussion, shared decision making was for  attempt at medical management today, see below.  She has not had any further chest pain. Breathing is stable. LE edema is present but much improved.  Inpatient Medications    Scheduled Meds: . amiodarone  200 mg Oral BID  . anastrozole  1 mg Oral Daily  . vitamin C  500 mg Oral Daily  . aspirin EC  81 mg Oral Daily  . atorvastatin  40 mg Oral q1800  . ferrous sulfate  325 mg Oral Daily  . insulin aspart  0-9 Units Subcutaneous TID WC  . isosorbide mononitrate  30 mg Oral Once  . [START ON 05/31/2019] isosorbide mononitrate  60 mg Oral Daily  . levothyroxine  150 mcg Oral QAC breakfast  . magnesium oxide  400 mg Oral Daily  . sodium chloride flush  3 mL Intravenous Q12H  . torsemide  20 mg Oral Daily   Continuous Infusions: . sodium chloride 10 mL/hr at 05/30/19 0600  . heparin 850 Units/hr (05/30/19 0600)   PRN Meds: sodium chloride, acetaminophen **OR** acetaminophen, lidocaine, nitroGLYCERIN, ondansetron **OR** ondansetron (ZOFRAN) IV   Vital Signs    Vitals:   05/29/19 1923 05/30/19 0036 05/30/19 0323 05/30/19 0807  BP: (!) 144/48 (!) 167/72 (!) 178/72 (!) 157/46  Pulse: 60 78 73 (!) 54  Resp: 18 18 18    Temp: 99 F (37.2 C) 98.1 F (36.7 C) 98.8 F (37.1 C) 99 F (37.2 C)  TempSrc: Oral Oral Oral Oral  SpO2: 100% 98% 97% 99%  Weight:  75 kg    Height:        Intake/Output Summary (Last 24  hours) at 05/30/2019 0938 Last data filed at 05/30/2019 0658 Gross per 24 hour  Intake 680.32 ml  Output 2250 ml  Net -1569.68 ml   Last 3 Weights 05/30/2019 05/29/2019 05/29/2019  Weight (lbs) 165 lb 4.8 oz 165 lb 5.5 oz 165 lb 6.4 oz  Weight (kg) 74.98 kg 75 kg 75.025 kg      Telemetry    4/11 had two post-conversion pauses. Otherwise largely sinus rhythm/sinus arrhythmia with rare PVCs - Personally Reviewed  ECG    4/12 sinus rhythm/sinus arrhythmia with PVCs - Personally Reviewed  Physical Exam   GEN: No acute distress.   Neck: No JVD Cardiac: RRR, no rubs, or  gallops. 3/6 HSM Respiratory: Clear to auscultation bilaterally. GI: Soft, nontender, non-distended  MS: Trace LE bilateral edema; No deformity. Neuro:  Nonfocal  Psych: Normal affect   Labs    High Sensitivity Troponin:   Recent Labs  Lab 05/25/19 0617 05/25/19 0918 05/25/19 1221 05/27/19 0940 05/27/19 1255  TROPONINIHS 702* 637* 576* 235* 254*      Chemistry Recent Labs  Lab 05/25/19 0617 05/26/19 0457 05/27/19 0421 05/28/19 0424 05/29/19 0409  NA 141   < > 140 139 138  K 4.0   < > 3.8 4.0 3.4*  CL 103   < > 100 96* 97*  CO2 26   < > 29 29 30   GLUCOSE 130*   < > 123* 137* 111*  BUN 51*   < > 55* 60* 60*  CREATININE 2.04*   < > 2.22* 2.37* 2.32*  CALCIUM 8.7*   < > 8.7* 9.2 8.6*  PROT 5.5*  --   --   --   --   ALBUMIN 3.1*  --   --   --   --   AST 27  --   --   --   --   ALT 14  --   --   --   --   ALKPHOS 18*  --   --   --   --   BILITOT 0.5  --   --   --   --   GFRNONAA 21*   < > 19* 18* 18*  GFRAA 25*   < > 22* 21* 21*  ANIONGAP 12   < > 11 14 11    < > = values in this interval not displayed.     Hematology Recent Labs  Lab 05/27/19 0421 05/29/19 0409 05/30/19 0527  WBC 6.3 5.2 5.7  RBC 2.84* 2.73* 2.90*  HGB 8.6* 8.2* 8.7*  HCT 27.2* 25.9* 27.3*  MCV 95.8 94.9 94.1  MCH 30.3 30.0 30.0  MCHC 31.6 31.7 31.9  RDW 14.0 14.1 13.9  PLT 250 222 241    BNP Recent Labs  Lab 05/25/19 0135  BNP 2,812.1*     DDimer No results for input(s): DDIMER in the last 168 hours.   Radiology    DG Chest Port 1 View  Result Date: 05/29/2019 CLINICAL DATA:  Pleural effusion.  Shortness of breath. EXAM: PORTABLE CHEST 1 VIEW COMPARISON:  05/27/2019 FINDINGS: Lungs are adequately inflated without focal airspace consolidation or effusion. Cardiomediastinal silhouette, bones and soft tissues are unchanged. IMPRESSION: No acute disease. Electronically Signed   By: Marin Olp M.D.   On: 05/29/2019 15:27    Cardiac Studies   From Care Everywhere: Discharge  summary 12/2014: This patient will be hospitalized within 30 days for a planned procedure/surgery as a continuation of their care: No HOSPITAL COURSE  *  ST elevation myocardial infarction (STEMI) of inferior wall (HCC) (present on admission) - Symptoms began more than 72 hours prior to presentation. She continues to remain pain free. - ECG suggests completed inferior infarct. Troponin values suggest completed mild to moderate-sized infarct. - With her time to beginning of symptoms to presentation being delayed, and her ECG findings, there was no definite benefit of emergent catheterization and possible intervention according to the OAT trial. - continue medical therapy with ASA, Plavix, statin, nitrate and continue BB.  - no evidence of any MI complications - echo with normal EF and no other MI complications - catheterization showed occluded proximal RCA with mild to moderate nonobstructive disease elsewhere.  Cath 12/29/2014:  Left heart catheterization and left ventriculography were not performed   given the patient's preserved LV function by echocardiography and renal   insufficiency.   Mild to moderate nonocclusive LAD and circumflex disease.   Chronically occluded RCA with extensive left-to-right collaterals.   Medical management recommended.     Echo 05/25/19:  1. Left ventricular ejection fraction, by estimation, is 45 to 50%. The  left ventricle has mildly decreased function. The left ventricle  demonstrates regional wall motion abnormalities (see scoring  diagram/findings for description). There is moderate  left ventricular hypertrophy. Left ventricular diastolic parameters are  indeterminate. There is moderate hypokinesis of the left ventricular,  entire inferior wall.  2. Right ventricular systolic function is normal. The right ventricular  size is normal. There is normal pulmonary artery systolic pressure.  3. Left atrial size was severely dilated.  4. Large  pleural effusion in the left lateral region.  5. The mitral valve is grossly normal. Moderate to severe mitral valve  regurgitation.  6. The aortic valve is tricuspid. Aortic valve regurgitation is not  visualized. Mild to moderate aortic valve stenosis. Aortic valve area, by  VTI measures 1.49 cm. Aortic valve mean gradient measures 10.4 mmHg.  Aortic valve Vmax measures 2.17 m/s.  7. The inferior vena cava is dilated in size with >50% respiratory  variability, suggesting right atrial pressure of 8 mmHg.   Comparison(s): 02/16/19: LVEF 60-65%, severe MR, severe LAE.   Echo 02/16/2019:  1. Left ventricular ejection fraction, by visual estimation, is 60 to  65%. is mildly increased left ventricular hypertrophy.  2. Left ventricular diastolic parameters are indeterminate.  3. Mildly dilated left ventricular internal cavity size.  4. Global right ventricle has normal systolic function.The right  ventricular size is normal. No increase in right ventricular wall  thickness.  5. Left atrial size was severely dilated.  6. Right atrial size was normal.  7. Mild mitral valve prolapse.  8. Severe mitral valve regurgitation. No evidence of mitral stenosis.  9. The tricuspid valve is normal in structure.  10. The aortic valve is normal in structure. Aortic valve regurgitation is  not visualized. Mild aortic valve stenosis.  11. The pulmonic valve was normal in structure. Pulmonic valve  regurgitation is not visualized.  12. There is dilatation of the ascending aorta measuring 38 mm.  13. Mildly elevated pulmonary artery systolic pressure.  14. The inferior vena cava is normal in size with greater than 50%  respiratory variability, suggesting right atrial pressure of 3 mmHg.  15. Severe MR with jet directed posteriorly. Minimal prolaps of the middle  segment of the anterior leaflet of the mitral valve noted A2.   Patient Profile     86 y.o. female with PMH late RCA STEMI  12/2014, managed medically, paroxysmal atrial fibrillation,  chronic diastolic heart failure with new systolic heart failure, severe mitral regurgitation, mild AS, CKD stage 4, anemia, type II diabetes, hypertension, hyperlipidemia, hypothyroidism, breast cancer who presented with shortness of breath and edema consistent with heart failure. Also had an episode of chest pain 05/29/19, noted to have significant post-conversion pauses on telemetry.  Assessment & Plan    Acute on chronic diastolic heart failure, new acute systolic heart failure: Left pleural effusion, s/p thoracentesis 4/9 -admission weight 79.2 kg, current weight 75 kg -charted as net negative nearly 6 liters -significant reduction in LE edema and abdominal distension -breathing well on room air -on lasix 20 mg daily at home. Given her CKD, suspect she was not getting adequate diuresis with this amount -has received lasix 40 mg IV daily while admitted -will trial torsemide 20 mg this AM given significant gut and LE edema (better oral absorption) to see if she makes adequate urine on this -compression stockings ordered today  Known CAD, with occluded RCA with left to right collaterals, episode of chest pain: See above. We discussed cath at length. She understands risks, but she would like to attempt a trial of medical management -had elevated troponin this admission, with flat trend. Likely demand ischemia given heart failure, CKD -continue heparin, aspirin for today -ambulation today -if no chest pain, no plans for pacemaker implantation, will switch to DOAC tomorrow. Continue heparin for now. -on atorvastatin 40 mg, imdur 30 mg daily -carvedilol held given prolonged pauses this admission -renal function precludes ACEi, ARB, ARNI -will uptitrate imdur today given elevated blood pressures. May need to add amlodipine or hydralazine if remains elevated.  Paroxysmal atrial fibrillation Symptomatic post termination pauses Tachy  brady syndrome, with rapid atrial fibrillation -EP is following -started on amiodarone to attempt to remain in sinus rhythm -no further significant pauses since yesterday -she is amenable to pacemaker if needed but would like to avoid if not needed  Severe MR:  -Had discussed mitraclip eval with Dr. Agustin Cree, but was put on hold given breast cancer diagnosis -would recommend referral to Dr. Damian Leavell heart team as an outpatient, as her MR is likely to complicate her heart failure and also exacerbate her atrial fibrillation  For questions or updates, please contact [facility] Please consult [email or web address] for contact info under     Signed, Buford Dresser, MD  05/30/2019, 9:38 AM

## 2019-05-31 ENCOUNTER — Other Ambulatory Visit: Payer: Self-pay | Admitting: Family Medicine

## 2019-05-31 DIAGNOSIS — I340 Nonrheumatic mitral (valve) insufficiency: Secondary | ICD-10-CM

## 2019-05-31 LAB — BASIC METABOLIC PANEL
Anion gap: 9 (ref 5–15)
BUN: 63 mg/dL — ABNORMAL HIGH (ref 8–23)
CO2: 30 mmol/L (ref 22–32)
Calcium: 8.7 mg/dL — ABNORMAL LOW (ref 8.9–10.3)
Chloride: 99 mmol/L (ref 98–111)
Creatinine, Ser: 2.39 mg/dL — ABNORMAL HIGH (ref 0.44–1.00)
GFR calc Af Amer: 20 mL/min — ABNORMAL LOW (ref >60)
GFR calc non Af Amer: 18 mL/min — ABNORMAL LOW (ref >60)
Glucose, Bld: 119 mg/dL — ABNORMAL HIGH (ref 70–99)
Potassium: 3.7 mmol/L (ref 3.5–5.1)
Sodium: 138 mmol/L (ref 135–145)

## 2019-05-31 LAB — GLUCOSE, CAPILLARY
Glucose-Capillary: 110 mg/dL — ABNORMAL HIGH (ref 70–99)
Glucose-Capillary: 141 mg/dL — ABNORMAL HIGH (ref 70–99)

## 2019-05-31 LAB — CBC
HCT: 26.6 % — ABNORMAL LOW (ref 36.0–46.0)
Hemoglobin: 8.3 g/dL — ABNORMAL LOW (ref 12.0–15.0)
MCH: 30 pg (ref 26.0–34.0)
MCHC: 31.2 g/dL (ref 30.0–36.0)
MCV: 96 fL (ref 80.0–100.0)
Platelets: 218 10*3/uL (ref 150–400)
RBC: 2.77 MIL/uL — ABNORMAL LOW (ref 3.87–5.11)
RDW: 13.7 % (ref 11.5–15.5)
WBC: 4.5 10*3/uL (ref 4.0–10.5)
nRBC: 0 % (ref 0.0–0.2)

## 2019-05-31 LAB — HEPARIN LEVEL (UNFRACTIONATED): Heparin Unfractionated: 0.92 IU/mL — ABNORMAL HIGH (ref 0.30–0.70)

## 2019-05-31 LAB — APTT: aPTT: 87 seconds — ABNORMAL HIGH (ref 24–36)

## 2019-05-31 MED ORDER — APIXABAN 2.5 MG PO TABS
2.5000 mg | ORAL_TABLET | Freq: Two times a day (BID) | ORAL | Status: DC
  Administered 2019-05-31: 2.5 mg via ORAL
  Filled 2019-05-31: qty 1

## 2019-05-31 MED ORDER — TORSEMIDE 20 MG PO TABS: 20.0000 mg | ORAL_TABLET | Freq: Every day | ORAL | 1 refills | Status: DC

## 2019-05-31 MED ORDER — ISOSORBIDE MONONITRATE ER 60 MG PO TB24: 60.0000 mg | ORAL_TABLET | Freq: Every day | ORAL | 1 refills | Status: DC

## 2019-05-31 MED ORDER — AMIODARONE HCL 200 MG PO TABS: 200.0000 mg | ORAL_TABLET | Freq: Two times a day (BID) | ORAL | 1 refills | Status: DC

## 2019-05-31 NOTE — Progress Notes (Addendum)
[facility] for Heparin Indication: chest pain/ACS  Allergies  Allergen Reactions  . Tape Itching and Rash    Surgical tape from cyst removed    Patient Measurements: Height: 5\' 4"  (162.6 cm) Weight: 75.1 kg (165 lb 8 oz)(scale b) IBW/kg (Calculated) : 54.7 Heparin Dosing Weight: 70.4  Vital Signs: Temp: 97.5 F (36.4 C) (04/13 0345) Temp Source: Oral (04/13 0345) BP: 168/43 (04/13 0736) Pulse Rate: 62 (04/13 0736)  Labs: Recent Labs    05/29/19 0409 05/29/19 0409 05/29/19 1522 05/30/19 0527 05/31/19 0435  HGB 8.2*   < >  --  8.7* 8.3*  HCT 25.9*  --   --  27.3* 26.6*  PLT 222  --   --  241 218  APTT  --   --  29 66* 87*  HEPARINUNFRC  --   --  >2.20* 2.04* 0.92*  CREATININE 2.32*  --   --  2.49* 2.39*   < > = values in this interval not displayed.    Estimated Creatinine Clearance: 16.2 mL/min (A) (by C-G formula based on SCr of 2.39 mg/dL (H)).    Assessment: 86 y.o. female admitted with chest pain, h/o Afib and Eliquis on hold (last dose 4/11), for heparin. Medically managing occluded RCA.   Heparin and aPTT level not yet correlating, will continue to dose based on aPTT. aPTT this morning therapeutic at 87 seconds. CBC low stable, patient eating >75% of meals. No issues with bleeding or infusion lines reported per RN.  Goal of Therapy:  Heparin level 0.3-0.7 units/ml aPTT 66-102 seconds Monitor platelets by anticoagulation protocol: Yes   Plan:  Continue Heparin at 850 units/hour Daily heparin, aPTT, CBC Monitor for s/sx's of bleeding F/U transition to [place]. Devin Going, [place] PGY1 Pharmacy Resident [phone] 05/31/19      9:10 AM  Please check AMION for all [facility] phone numbers After 10:00 PM, call the [facility] [phone]    ______________________________________________________________________ Addendum: pharmacy consulted to transition patient from heparin to apixaban. Plan to  start low dose of apixaban 2.5 mg BID due to age and creatinine.   Plan: Give first dose of apixaban 2.5 mg today at 1130 Discontinue heparin drip at 1130 Monitor s/sx of bleeding   Jyles Sontag L. Devin Going, [place] PGY1 Pharmacy Resident [phone] 05/31/19      10:44 AM  Please check AMION for all [place] phone numbers After 10:00 PM, call the [facility] [phone]

## 2019-05-31 NOTE — Discharge Summary (Signed)
Physician Discharge Summary  Lauren Walker RUE:[id number] DOB: 19-Aug-1929 DOA: 05/24/2019  PCP: Midge Minium, MD  Admit date: 05/24/2019 Discharge date: 05/31/2019  Admitted From: Home Disposition:  Home  Discharge Condition:Stable CODE STATUS:DNR Diet recommendation: Heart Healthy   Brief/Interim Summary:  Patient is 86 year old female with history of severe mitral regurgitation, paroxysmal A. fib, coronary artery disease, CKD stage IV, diabetes type 2, hypothyroidism, breast cancer, chronic anemia who presents to the emergency room with complaint of shortness of breath. She was also having increased bilateral lower extremity edema over few weeks and became acutely short of breath a day before admission.  On presentation,x-ray of the chest showed moderate left pleural effusion with possible infiltrates likely due to pulmonary edema. Troponin was elevated. Elevated BNP. Cardiology consulted for evaluation of acute congestive heart failure with possible non-STEMI.  She has been started on IV Lasix.  She had rolonged hospital course. EP also consulted for concern of pauses/bradycardia.  There was plan for possible cardiac cath/TEE/pacemaker placement but all these plans have been canceled and given patient's age and comorbidities, medical management chosen. She is hemodynamically stable for discharge home today.  Following problems were addressed during her hospitalization:  Acute systolic CHF: Presented with volume overload, bilateral lower extremity edema, shortness of breath, chest pain.  Echocardiogram done here showed ejection fraction of 40 to 45%, regional wall motion abnormality in the left ventricle, moderate LVH. She was on IV Lasix 40 mg IV twice daily now changed to torsemide 20 mg daily .    Moderate/severe mitral regurgitation: As per echo.    Plan for conservative management.  Chest pain/elevated troponin/history of coronary artery disease: EKG did not show any  ischemic changes.  Elevated troponin could be associated with congestive heart failure but echo has shown regional wall motion abnormality in the left ventricle.   On aspirin at home.  There was plan for cardiac cath which has now been canceled due to her CKD, advanced age.  Plan for medical management.  Left-sided pleural effusion :Chest x-ray had shown moderate left-sided pleural effusion. S/P US thoracentesis with removal of 550 mL of yellow fluid.  CKD stage IIIb: Baseline creatinine ranges from 2-2.5.  Currently kidney function at baseline.  Monitor closely.  BMP in a week.  Hypertension: Currently blood pressure stable.  Continue current medications.  She is on Imdur.  Paroxysmal A. Fib/pauses/bradycardia: On Eliquis for anticoagulation.  Currently rate is controlled. She was on carvedilol at home which is on hold due to concern for bradycardia/pauses.  EP consulted.  Started on amiodarone.   Left breast cancer: Estrogen receptor positive.  Follows with Dr. Jana Hakim.  On anastrozole.  Hyperlipidemia: On Lipitor  Hypothyroidism: On Synthyroid  Normocytic anemia: Most likely associated with CKD.  Currently hemoglobin stable.   Hypokalemia: Supplemented  Prolonged QTC: QTC of 499.    Monitor as an outpatient.    Discharge Diagnoses:  Principal Problem:   Acute CHF (congestive heart failure) (HCC) Active Problems:   Hypothyroid   HTN (hypertension)   CAD (coronary artery disease)   Mitral regurgitation moderate to severe based on echocardiogram in summer 2020   Malignant neoplasm of upper-outer quadrant of left breast in female, estrogen receptor positive (HCC)   CKD stage G4/A1, GFR 15-29 and albumin creatinine ratio <30 mg/g (HCC)   Anemia associated with stage 4 chronic renal failure (HCC)   Non-ST elevation MI (NSTEMI) [facility]    Discharge Instructions  Discharge Instructions    Diet - low sodium  heart healthy   Complete by: As directed    Discharge  instructions   Complete by: As directed    1)Take prescribed medications as instructed. 2)Follow up with your PCP in a week.  Do a BMP test during the follow-up. 3)Follow up with your cardiologist as an outpatient.   Increase activity slowly   Complete by: As directed      Allergies as of 05/31/2019      Reactions   Tape Itching, Rash   Surgical tape from cyst removed      Medication List    STOP taking these medications   carvedilol 12.5 MG tablet Commonly known as: COREG   fenofibrate 160 MG tablet   furosemide 20 MG tablet Commonly known as: LASIX   hydrALAZINE 50 MG tablet Commonly known as: APRESOLINE     TAKE these medications   acetaminophen 650 MG CR tablet Commonly known as: TYLENOL Take 650 mg by mouth every 8 (eight) hours as needed for pain.   amiodarone 200 MG tablet Commonly known as: PACERONE Take 1 tablet (200 mg total) by mouth 2 (two) times daily.   anastrozole 1 MG tablet Commonly known as: ARIMIDEX Take 1 tablet (1 mg total) by mouth daily.   apixaban 2.5 MG Tabs tablet Commonly known as: Eliquis Take 1 tablet (2.5 mg total) by mouth 2 (two) times daily.   aspirin EC 81 MG tablet Take 81 mg by mouth daily.   atorvastatin 40 MG tablet Commonly known as: LIPITOR Take 1 tablet (40 mg total) by mouth daily.   Azelastine-Fluticasone 137-50 MCG/ACT Susp Place 1 spray into both nostrils 2 (two) times daily.   b complex vitamins capsule Take 1 capsule by mouth daily.   Biotin 1 MG Caps Take 1 mg by mouth daily.   fluticasone 50 MCG/ACT nasal spray Commonly known as: FLONASE SPRAY 2 SPRAYS INTO EACH NOSTRIL EVERY DAY   Iron 325 (65 Fe) MG Tabs Take 1 tablet by mouth daily.   isosorbide mononitrate 60 MG 24 hr tablet Commonly known as: IMDUR Take 1 tablet (60 mg total) by mouth daily. Start taking on: June 01, 2019 What changed:   medication strength  how much to take   levothyroxine 150 MCG tablet Commonly known as:  SYNTHROID Take 1 tablet (150 mcg total) by mouth daily before breakfast.   magnesium oxide 400 MG tablet Commonly known as: MAG-OX Take 400 mg by mouth daily.   nitroGLYCERIN 0.4 MG SL tablet Commonly known as: NITROSTAT Place 0.4 mg under the tongue every 5 (five) minutes as needed for chest pain.   quinapril 20 MG tablet Commonly known as: ACCUPRIL Take 20 mg by mouth daily.   torsemide 20 MG tablet Commonly known as: DEMADEX Take 1 tablet (20 mg total) by mouth daily. Start taking on: June 01, 2019   vitamin C 500 MG tablet Commonly known as: ASCORBIC ACID Take 500 mg by mouth daily.   Vitamin D3 50 MCG (2000 UT) Tabs Take 1 capsule by mouth daily.      Follow-up Information    Midge Minium, MD. Schedule an appointment as soon as possible for a visit in 1 week(s).   Specialty: Family Medicine Contact information: [address] [place] [address] [phone]        Park Liter, MD. Schedule an appointment as soon as possible for a visit in 2 week(s).   Specialty: Cardiology Contact information: [place] [address]  [phone]          Allergies  Allergen Reactions  . Tape Itching and Rash    Surgical tape from cyst removed    Consultations:  Cardiology, EP   Procedures/Studies: DG Chest 1 View  Result Date: 05/27/2019 CLINICAL DATA:  Status post left thoracentesis EXAM: CHEST  1 VIEW COMPARISON:  05/24/2019 FINDINGS: Interval thoracentesis has been performed on the left with near complete reduction of the left-sided pleural effusion. No pneumothorax is noted. No focal infiltrate is seen. Cardiac shadow is stable. Aortic calcifications are again noted. IMPRESSION: Resolution of left pleural effusion.  No pneumothorax is noted. Electronically Signed   By: Inez Catalina M.D.   On: 05/27/2019 13:48   DG Chest 2 View  Result Date: 05/24/2019 CLINICAL DATA:  Shortness of breath. EXAM: CHEST - 2 VIEW  COMPARISON:  None. FINDINGS: Mild cardiomegaly is noted. Atherosclerosis of thoracic aorta is noted. No pneumothorax is noted. Right lung is clear. Moderate left pleural effusion is noted with probable underlying atelectasis or infiltrate. Bony thorax is unremarkable. IMPRESSION: Moderate left pleural effusion with probable underlying atelectasis or infiltrate. Aortic Atherosclerosis (ICD10-I70.0). Electronically Signed   By: Marijo Conception M.D.   On: 05/24/2019 20:55   DG Chest Port 1 View  Result Date: 05/29/2019 CLINICAL DATA:  Pleural effusion.  Shortness of breath. EXAM: PORTABLE CHEST 1 VIEW COMPARISON:  05/27/2019 FINDINGS: Lungs are adequately inflated without focal airspace consolidation or effusion. Cardiomediastinal silhouette, bones and soft tissues are unchanged. IMPRESSION: No acute disease. Electronically Signed   By: Marin Olp M.D.   On: 05/29/2019 15:27   ECHOCARDIOGRAM COMPLETE  Result Date: 05/25/2019    ECHOCARDIOGRAM REPORT   Patient Name:   Frutoso Schatz Date of Exam: 05/25/2019 Medical Rec #:  [id number]      Height:       64.0 in Accession #:    [id number]     Weight:       175.4 lb Date of Birth:  1929-10-12      BSA:          1.850 m Patient Age:    26 years       BP:           165/58 mmHg Patient Gender: F              HR:           59 bpm. Exam Location:  Inpatient Procedure: 2D Echo Indications:    Congestive Heart Failure I50.31  History:        Patient has prior history of Echocardiogram examinations, most                 recent 02/16/2019.  Sonographer:    Mikki Santee RDCS (AE) Referring Phys: [id number] Martyn Timme IMPRESSIONS  1. Left ventricular ejection fraction, by estimation, is 45 to 50%. The left ventricle has mildly decreased function. The left ventricle demonstrates regional wall motion abnormalities (see scoring diagram/findings for description). There is moderate left ventricular hypertrophy. Left ventricular diastolic parameters are indeterminate. There is  moderate hypokinesis of the left ventricular, entire inferior wall.  2. Right ventricular systolic function is normal. The right ventricular size is normal. There is normal pulmonary artery systolic pressure.  3. Left atrial size was severely dilated.  4. Large pleural effusion in the left lateral region.  5. The mitral valve is grossly normal. Moderate to severe mitral valve regurgitation.  6. The aortic valve is tricuspid. Aortic valve  regurgitation is not visualized. Mild to moderate aortic valve stenosis. Aortic valve area, by VTI measures 1.49 cm. Aortic valve mean gradient measures 10.4 mmHg. Aortic valve Vmax measures 2.17 m/s.  7. The inferior vena cava is dilated in size with >50% respiratory variability, suggesting right atrial pressure of 8 mmHg. Comparison(s): 02/16/19: LVEF 60-65%, severe MR, severe LAE. FINDINGS  Left Ventricle: Left ventricular ejection fraction, by estimation, is 45 to 50%. The left ventricle has mildly decreased function. The left ventricle demonstrates regional wall motion abnormalities. Moderate hypokinesis of the left ventricular, entire inferior wall. The left ventricular internal cavity size was normal in size. There is moderate left ventricular hypertrophy. Left ventricular diastolic parameters are indeterminate. Right Ventricle: The right ventricular size is normal. No increase in right ventricular wall thickness. Right ventricular systolic function is normal. There is normal pulmonary artery systolic pressure. The tricuspid regurgitant velocity is 2.29 m/s, and  with an assumed right atrial pressure of 8 mmHg, the estimated right ventricular systolic pressure is 30.0 mmHg. Left Atrium: Left atrial size was severely dilated. Right Atrium: Right atrial size was normal in size. Pericardium: There is no evidence of pericardial effusion. Mitral Valve: The mitral valve is grossly normal. Moderate to severe mitral valve regurgitation, with posteriorly-directed jet. Tricuspid  Valve: The tricuspid valve is grossly normal. Tricuspid valve regurgitation is trivial. Aortic Valve: The aortic valve is tricuspid. Aortic valve regurgitation is not visualized. Mild to moderate aortic stenosis is present. Aortic valve mean gradient measures 10.4 mmHg. Aortic valve peak gradient measures 18.8 mmHg. Aortic valve area, by VTI measures 1.49 cm. Pulmonic Valve: The pulmonic valve was grossly normal. Pulmonic valve regurgitation is trivial. Aorta: The aortic root and ascending aorta are structurally normal, with no evidence of dilitation. Venous: The inferior vena cava is dilated in size with greater than 50% respiratory variability, suggesting right atrial pressure of 8 mmHg. IAS/Shunts: No atrial level shunt detected by color flow Doppler. Additional Comments: There is a large pleural effusion in the left lateral region.  LEFT VENTRICLE PLAX 2D LVIDd:         4.60 cm  Diastology LVIDs:         3.80 cm  LV e' lateral:   6.97 cm/s LV PW:         1.30 cm  LV E/e' lateral: 12.6 LV IVS:        1.30 cm  LV e' medial:    4.79 cm/s LVOT diam:     2.30 cm  LV E/e' medial:  18.3 LV SV:         85 LV SV Index:   46 LVOT Area:     4.15 cm  RIGHT VENTRICLE RV S prime:     14.50 cm/s TAPSE (M-mode): 1.8 cm LEFT ATRIUM              Index       RIGHT ATRIUM           Index LA diam:        4.00 cm  2.16 cm/m  RA Area:     17.60 cm LA Vol (A2C):   114.0 ml 61.61 ml/m RA Volume:   40.30 ml  21.78 ml/m LA Vol (A4C):   67.8 ml  36.64 ml/m LA Biplane Vol: 94.1 ml  50.86 ml/m  AORTIC VALVE AV Area (Vmax):    1.36 cm AV Area (Vmean):   1.33 cm AV Area (VTI):     1.49 cm AV Vmax:  216.80 cm/s AV Vmean:          151.600 cm/s AV VTI:            0.571 m AV Peak Grad:      18.8 mmHg AV Mean Grad:      10.4 mmHg LVOT Vmax:         70.80 cm/s LVOT Vmean:        48.400 cm/s LVOT VTI:          0.204 m LVOT/AV VTI ratio: 0.36  AORTA Ao Root diam: 3.20 cm MITRAL VALVE               TRICUSPID VALVE MV Area (PHT):  3.65 cm    TR Peak grad:   21.0 mmHg MV Decel Time: 208 msec    TR Vmax:        229.00 cm/s MR Peak grad: 103.2 mmHg MR Vmax:      508.00 cm/s  SHUNTS MV E velocity: 87.50 cm/s  Systemic VTI:  0.20 m MV A velocity: 33.40 cm/s  Systemic Diam: 2.30 cm MV E/A ratio:  2.62 Lyman Bishop MD Electronically signed by Lyman Bishop MD Signature Date/Time: 05/25/2019/4:51:06 PM    Final    IR THORACENTESIS ASP PLEURAL SPACE W/IMG GUIDE  Result Date: 05/27/2019 INDICATION: Patient with history of congestive heart failure, now with left pleural effusion. Request is made for therapeutic thoracentesis. EXAM: ULTRASOUND GUIDED THERAPEUTIC THORACENTESIS MEDICATIONS: 10 mL 1% lidocaine COMPLICATIONS: None immediate. PROCEDURE: An ultrasound guided thoracentesis was thoroughly discussed with the patient and questions answered. The benefits, risks, alternatives and complications were also discussed. The patient understands and wishes to proceed with the procedure. Written consent was obtained. Ultrasound was performed to localize and mark an adequate pocket of fluid in the left chest. The area was then prepped and draped in the normal sterile fashion. 1% Lidocaine was used for local anesthesia. Under ultrasound guidance a 6 Fr Safe-T-Centesis catheter was introduced. Thoracentesis was performed. The catheter was removed and a dressing applied. FINDINGS: A total of approximately 550 mL of yellow fluid was removed. IMPRESSION: Successful ultrasound guided therapeutic thoracentesis yielding 550 mL of pleural fluid. Read by: Brynda Greathouse PA-C Electronically Signed   By: Jerilynn Mages.  Shick M.D.   On: 05/27/2019 13:58       Subjective: Patient seen and examined at the bedside this morning.  Hemodynamically  stable for discharge home today.  Discharge Exam: Vitals:   05/31/19 0345 05/31/19 0736  BP: (!) 168/59 (!) 168/43  Pulse: 63 62  Resp: 18 18  Temp: (!) 97.5 F (36.4 C)   SpO2: 98% 98%   Vitals:   05/30/19 2000 05/30/19  2309 05/31/19 0345 05/31/19 0736  BP: (!) 164/74 (!) 123/49 (!) 168/59 (!) 168/43  Pulse: 61 (!) 59 63 62  Resp: 18  18 18   Temp: (!) 97.5 F (36.4 C)  (!) 97.5 F (36.4 C)   TempSrc: Oral  Oral   SpO2: 99%  98% 98%  Weight:   75.1 kg   Height:        General: Pt is alert, awake, not in acute distress Cardiovascular: RRR, S1/S2 +, no rubs, no gallops Respiratory: CTA bilaterally, no wheezing, no rhonchi Abdominal: Soft, NT, ND, bowel sounds + Extremities: no edema, no cyanosis    The results of significant diagnostics from this hospitalization (including imaging, microbiology, ancillary and laboratory) are listed below for reference.     Microbiology: Recent Results (from the past 240 hour(s))  SARS CORONAVIRUS 2 (TAT 6-24 HRS) Nasopharyngeal Nasopharyngeal Swab     Status: None   Collection Time: 05/25/19  1:35 AM   Specimen: Nasopharyngeal Swab  Result Value Ref Range Status   SARS Coronavirus 2 NEGATIVE NEGATIVE Final    Comment: (NOTE) SARS-CoV-2 target nucleic acids are NOT DETECTED. The SARS-CoV-2 RNA is generally detectable in upper and lower respiratory specimens during the acute phase of infection. Negative results do not preclude SARS-CoV-2 infection, do not rule out co-infections with other pathogens, and should not be used as the sole basis for treatment or other patient management decisions. Negative results must be combined with clinical observations, patient history, and epidemiological information. The expected result is Negative. Fact Sheet for Patients: SugarRoll.be Fact Sheet for Healthcare Providers: https://www.woods-mathews.com/ This test is not yet approved or cleared by the [place] FDA and  has been authorized for detection and/or diagnosis of SARS-CoV-2 by FDA under an Emergency Use Authorization (EUA). This EUA will remain  in effect (meaning this test can be used) for the duration of the COVID-19  declaration under Section 56 4(b)(1) of the Act, 21 U.S.C. section 360bbb-3(b)(1), unless the authorization is terminated or revoked sooner. Performed at [facility], [place] [address] [place], [place] [address]      Labs: BNP (last 3 results) Recent Labs    05/25/19 0135  BNP 4,967.5*   Basic Metabolic Panel: Recent Labs  Lab 05/27/19 0421 05/28/19 0424 05/29/19 0409 05/30/19 0527 05/31/19 0435  NA 140 139 138 139 138  K 3.8 4.0 3.4* 3.9 3.7  CL 100 96* 97* 99 99  CO2 29 29 30 29 30   GLUCOSE 123* 137* 111* 105* 119*  BUN 55* 60* 60* 61* 63*  CREATININE 2.22* 2.37* 2.32* 2.49* 2.39*  CALCIUM 8.7* 9.2 8.6* 8.8* 8.7*  MG  --   --  2.1  --   --    Liver Function Tests: Recent Labs  Lab 05/25/19 0617  AST 27  ALT 14  ALKPHOS 18*  BILITOT 0.5  PROT 5.5*  ALBUMIN 3.1*   No results for input(s): LIPASE, AMYLASE in the last 168 hours. No results for input(s): AMMONIA in the last 168 hours. CBC: Recent Labs  Lab 05/25/19 0617 05/27/19 0421 05/29/19 0409 05/30/19 0527 05/31/19 0435  WBC 7.5 6.3 5.2 5.7 4.5  NEUTROABS 5.4 3.9 2.8  --   --   HGB 9.4* 8.6* 8.2* 8.7* 8.3*  HCT 30.0* 27.2* 25.9* 27.3* 26.6*  MCV 98.0 95.8 94.9 94.1 96.0  PLT 233 250 222 241 218   Cardiac Enzymes: No results for input(s): CKTOTAL, CKMB, CKMBINDEX, TROPONINI in the last 168 hours. BNP: Invalid input(s): POCBNP CBG: Recent Labs  Lab 05/30/19 1057 05/30/19 1623 05/30/19 2140 05/31/19 0621 05/31/19 1111  GLUCAP 164* 129* 170* 110* 141*   D-Dimer No results for input(s): DDIMER in the last 72 hours. Hgb A1c No results for input(s): HGBA1C in the last 72 hours. Lipid Profile No results for input(s): CHOL, HDL, LDLCALC, TRIG, CHOLHDL, LDLDIRECT in the last 72 hours. Thyroid function studies No results for input(s): TSH, T4TOTAL, T3FREE, THYROIDAB in the last 72 hours.  Invalid input(s): FREET3 Anemia work up No results for input(s): VITAMINB12, FOLATE, FERRITIN,  TIBC, IRON, RETICCTPCT in the last 72 hours. Urinalysis No results found for: COLORURINE, APPEARANCEUR, LABSPEC, [place], GLUCOSEU, [place], [place], KETONESUR, PROTEINUR, UROBILINOGEN, NITRITE, LEUKOCYTESUR Sepsis Labs Invalid input(s): PROCALCITONIN,  WBC,  LACTICIDVEN Microbiology Recent Results (from the past 240 hour(s))  SARS  CORONAVIRUS 2 (TAT 6-24 HRS) Nasopharyngeal Nasopharyngeal Swab     Status: None   Collection Time: 05/25/19  1:35 AM   Specimen: Nasopharyngeal Swab  Result Value Ref Range Status   SARS Coronavirus 2 NEGATIVE NEGATIVE Final    Comment: (NOTE) SARS-CoV-2 target nucleic acids are NOT DETECTED. The SARS-CoV-2 RNA is generally detectable in upper and lower respiratory specimens during the acute phase of infection. Negative results do not preclude SARS-CoV-2 infection, do not rule out co-infections with other pathogens, and should not be used as the sole basis for treatment or other patient management decisions. Negative results must be combined with clinical observations, patient history, and epidemiological information. The expected result is Negative. Fact Sheet for Patients: SugarRoll.be Fact Sheet for Healthcare Providers: https://www.woods-mathews.com/ This test is not yet approved or cleared by the [place] FDA and  has been authorized for detection and/or diagnosis of SARS-CoV-2 by FDA under an Emergency Use Authorization (EUA). This EUA will remain  in effect (meaning this test can be used) for the duration of the COVID-19 declaration under Section 56 4(b)(1) of the Act, 21 U.S.C. section 360bbb-3(b)(1), unless the authorization is terminated or revoked sooner. Performed at [facility], [place] [address], [place], [place] [address]     Please note: You were cared for by a hospitalist during your hospital stay. Once you are discharged, your primary care physician will handle any further medical  issues. Please note that NO REFILLS for any discharge medications will be authorized once you are discharged, as it is imperative that you return to your primary care physician (or establish a relationship with a primary care physician if you do not have one) for your post hospital discharge needs so that they can reassess your need for medications and monitor your lab values.    Time coordinating discharge: 40 minutes  SIGNED:   Shelly Coss, MD  [facility] 05/31/2019, 11:20 AM Pager [phone]  If 7PM-7AM, please contact night-coverage [email or web address] Password [email or web address]

## 2019-05-31 NOTE — Discharge Instructions (Signed)

## 2019-05-31 NOTE — Progress Notes (Signed)
Progress Note  Patient Name: Lauren Walker Date of Encounter: 05/31/2019  Primary Cardiologist: Jenne Campus, MD   Subjective   Very pleasant and reasonable. Doing well this AM. Wants to go home. No chest pain. Ambulated yesterday with no SOB per patient report.   Inpatient Medications    Scheduled Meds: . amiodarone  200 mg Oral BID  . anastrozole  1 mg Oral Daily  . vitamin C  500 mg Oral Daily  . aspirin EC  81 mg Oral Daily  . atorvastatin  40 mg Oral q1800  . ferrous sulfate  325 mg Oral Daily  . insulin aspart  0-9 Units Subcutaneous TID WC  . isosorbide mononitrate  60 mg Oral Daily  . levothyroxine  150 mcg Oral QAC breakfast  . magnesium oxide  400 mg Oral Daily  . sodium chloride flush  3 mL Intravenous Q12H  . torsemide  20 mg Oral Daily   Continuous Infusions: . sodium chloride 250 mL (05/30/19 2142)  . heparin 850 Units/hr (05/31/19 0328)   PRN Meds: sodium chloride, acetaminophen **OR** acetaminophen, lidocaine, nitroGLYCERIN, ondansetron **OR** ondansetron (ZOFRAN) IV   Vital Signs    Vitals:   05/30/19 2000 05/30/19 2309 05/31/19 0345 05/31/19 0736  BP: (!) 164/74 (!) 123/49 (!) 168/59 (!) 168/43  Pulse: 61 (!) 59 63 62  Resp: 18  18 18   Temp: (!) 97.5 F (36.4 C)  (!) 97.5 F (36.4 C)   TempSrc: Oral  Oral   SpO2: 99%  98% 98%  Weight:   75.1 kg   Height:        Intake/Output Summary (Last 24 hours) at 05/31/2019 0947 Last data filed at 05/31/2019 0811 Gross per 24 hour  Intake 840 ml  Output 2100 ml  Net -1260 ml   Filed Weights   05/29/19 1500 05/30/19 0036 05/31/19 0345  Weight: 75 kg 75 kg 75.1 kg    Physical Exam   General: Well developed, well nourished, NAD Neck: Negative for carotid bruits. No JVD Lungs:Clear to ausculation bilaterally. No wheezes, rales, or rhonchi. Breathing is unlabored. Cardiovascular: RRR with S1 S2. + murmur Abdomen: Soft, non-tender, non-distended. No obvious abdominal masses. Extremities:  1-2+BLE edema. Radial pulses 2+ bilaterally Neuro: Alert and oriented. No focal deficits. No facial asymmetry. MAE spontaneously. Psych: Responds to questions appropriately with normal affect.    Labs    Chemistry Recent Labs  Lab 05/25/19 0617 05/26/19 0457 05/29/19 0409 05/30/19 0527 05/31/19 0435  NA 141   < > 138 139 138  K 4.0   < > 3.4* 3.9 3.7  CL 103   < > 97* 99 99  CO2 26   < > 30 29 30   GLUCOSE 130*   < > 111* 105* 119*  BUN 51*   < > 60* 61* 63*  CREATININE 2.04*   < > 2.32* 2.49* 2.39*  CALCIUM 8.7*   < > 8.6* 8.8* 8.7*  PROT 5.5*  --   --   --   --   ALBUMIN 3.1*  --   --   --   --   AST 27  --   --   --   --   ALT 14  --   --   --   --   ALKPHOS 18*  --   --   --   --   BILITOT 0.5  --   --   --   --   GFRNONAA 21*   < >  18* 17* 18*  GFRAA 25*   < > 21* 19* 20*  ANIONGAP 12   < > 11 11 9    < > = values in this interval not displayed.     Hematology Recent Labs  Lab 05/29/19 0409 05/30/19 0527 05/31/19 0435  WBC 5.2 5.7 4.5  RBC 2.73* 2.90* 2.77*  HGB 8.2* 8.7* 8.3*  HCT 25.9* 27.3* 26.6*  MCV 94.9 94.1 96.0  MCH 30.0 30.0 30.0  MCHC 31.7 31.9 31.2  RDW 14.1 13.9 13.7  PLT 222 241 218    Cardiac EnzymesNo results for input(s): TROPONINI in the last 168 hours. No results for input(s): TROPIPOC in the last 168 hours.   BNP Recent Labs  Lab 05/25/19 0135  BNP 2,812.1*     DDimer No results for input(s): DDIMER in the last 168 hours.   Radiology    DG Chest Port 1 View  Result Date: 05/29/2019 CLINICAL DATA:  Pleural effusion.  Shortness of breath. EXAM: PORTABLE CHEST 1 VIEW COMPARISON:  05/27/2019 FINDINGS: Lungs are adequately inflated without focal airspace consolidation or effusion. Cardiomediastinal silhouette, bones and soft tissues are unchanged. IMPRESSION: No acute disease. Electronically Signed   By: Marin Olp M.D.   On: 05/29/2019 15:27   Telemetry    05/31/19 SB with rates in the 50-60 range. NO pauses overnight -  Personally Reviewed  ECG    No new tracing as of 05/31/19- Personally Reviewed  Cardiac Studies   From Care Everywhere: Discharge summary 12/2014: This patient will be hospitalized within 30 days for a planned procedure/surgery as a continuation of their care: No HOSPITAL COURSE  * ST elevation myocardial infarction (STEMI) of inferior wall ([place]) (present on admission) - Symptoms began more than 72 hours prior to presentation. She continues to remain pain free. - ECG suggests completed inferior infarct. Troponin values suggest completed mild to moderate-sized infarct. - With her time to beginning of symptoms to presentation being delayed, and her ECG findings, there was no definite benefit of emergent catheterization and possible intervention according to the OAT trial. - continue medical therapy with ASA, Plavix, statin, nitrate and continue BB.  - no evidence of any MI complications - echo with normal EF and no other MI complications - catheterization showed occluded proximal RCA with mild to moderate nonobstructive disease elsewhere.  Cath 12/29/2014:  Left heart catheterization and left ventriculography were not performed   given the patient's preserved LV function by echocardiography and renal   insufficiency.   Mild to moderate nonocclusive LAD and circumflex disease.   Chronically occluded RCA with extensive left-to-right collaterals.   Medical management recommended.     Echo 05/25/19:  1. Left ventricular ejection fraction, by estimation, is 45 to 50%. The  left ventricle has mildly decreased function. The left ventricle  demonstrates regional wall motion abnormalities (see scoring  diagram/findings for description). There is moderate  left ventricular hypertrophy. Left ventricular diastolic parameters are  indeterminate. There is moderate hypokinesis of the left ventricular,  entire inferior wall.  2. Right ventricular systolic function is normal. The  right ventricular  size is normal. There is normal pulmonary artery systolic pressure.  3. Left atrial size was severely dilated.  4. Large pleural effusion in the left lateral region.  5. The mitral valve is grossly normal. Moderate to severe mitral valve  regurgitation.  6. The aortic valve is tricuspid. Aortic valve regurgitation is not  visualized. Mild to moderate aortic valve stenosis. Aortic valve area, by  VTI  measures 1.49 cm. Aortic valve mean gradient measures 10.4 mmHg.  Aortic valve Vmax measures 2.17 m/s.  7. The inferior vena cava is dilated in size with >50% respiratory  variability, suggesting right atrial pressure of 8 mmHg.   Comparison(s): 02/16/19: LVEF 60-65%, severe MR, severe LAE.   Echo 02/16/2019:  1. Left ventricular ejection fraction, by visual estimation, is 60 to  65%. is mildly increased left ventricular hypertrophy.  2. Left ventricular diastolic parameters are indeterminate.  3. Mildly dilated left ventricular internal cavity size.  4. Global right ventricle has normal systolic function.The right  ventricular size is normal. No increase in right ventricular wall  thickness.  5. Left atrial size was severely dilated.  6. Right atrial size was normal.  7. Mild mitral valve prolapse.  8. Severe mitral valve regurgitation. No evidence of mitral stenosis.  9. The tricuspid valve is normal in structure.  10. The aortic valve is normal in structure. Aortic valve regurgitation is  not visualized. Mild aortic valve stenosis.  11. The pulmonic valve was normal in structure. Pulmonic valve  regurgitation is not visualized.  12. There is dilatation of the ascending aorta measuring 38 mm.  13. Mildly elevated pulmonary artery systolic pressure.  14. The inferior vena cava is normal in size with greater than 50%  respiratory variability, suggesting right atrial pressure of 3 mmHg.  15. Severe MR with jet directed posteriorly. Minimal prolaps of  the middle  segment of the anterior leaflet of the mitral valve noted A2.   Patient Profile     86 y.o. female with PMH late RCA STEMI 12/2014, managed medically, paroxysmal atrial fibrillation, chronic diastolic heart failure with new systolic heart failure, severe mitral regurgitation, mild AS, CKD stage 4, anemia, type II diabetes, hypertension, hyperlipidemia, hypothyroidism, breast cancer who presented with shortness of breath and edema consistent with heart failure. Also had an episode of chest pain 05/29/19, noted to have significant post-conversion pauses on telemetry.  Assessment & Plan    1. Acute on chronic diastolic heart failure, new acute systolic heart failure: -Noted to have left pleural effusion, s/p thoracentesis 4/9 -Trial of torsemide 20 mg initiated yesterday  -Weight, 165.5lb today with admission weight of 175.4lb  -I&O, 7.1L since admission  -BLE improved  -Compression stocking placed  2. Known CAD, with occluded RCA with left to right collaterals, episode of chest pain: -LHC offered however patient has ultimately deferred due to age and other medical conditions>>plan to pursue medical management -Continue aspirin>>needs to transition to DOAC  -On atorvastatin 40 mg, Imdur 30 mg daily -No carvedilol in the setting of bradycardia -Creatinine, 2.39 today which precludes ACEi, ARB, ARNI -Imdur up-titrated yesterday  -May need to add amlodipine or hydralazine if remains elevated.  3. Paroxysmal atrial fibrillation: -Symptomatic post termination pauses with tachy brady syndrome, with rapid atrial fibrillation>>EP following  -Started on amiodarone to attempt to remain in sinus rhythm -No further significant pauses since yesterday -No PPM in the setting of breast CA per EP   4. Severe MR:  -Had discussed mitraclip eval with Dr. Agustin Cree, but was put on hold given breast cancer diagnosis -Initial plan was to refer to Dr. Damian Leavell heart team as an outpatient,  as her MR is likely to complicate her heart failure and also exacerbate her atrial fibrillation  -This seems very low on the patients priority list per discussion today.   Signed, Kathyrn Drown NP-C [facility] Pager: [phone] 05/31/2019, 9:47 AM     For questions or updates,  please contact   Please consult [email or web address] for contact info under Cardiology/STEMI.

## 2019-05-31 NOTE — TOC Initial Note (Signed)
Transition of Care [facility]) - Initial/Assessment Note    Patient Details  Name: Lauren Walker MRN: [id number] Date of Birth: [birth year]  [facility]) CM/SW Contact:    Zenon Mayo, RN Phone Number: 05/31/2019, 1:25 PM  Clinical Narrative:                 Patient is for dc today, she lives with spouse, she uses a cane at home. She has transportation today.  She does not have issues with getting medications.   Expected Discharge Plan: Home/Self Care Barriers to Discharge: No Barriers Identified   Patient Goals and CMS Choice Patient states their goals for this hospitalization and ongoing recovery are:: get better   Choice offered to / list presented to : NA  Expected Discharge Plan and Services Expected Discharge Plan: Home/Self Care   Discharge Planning Services: CM Consult   Living arrangements for the past 2 months: Single Family Home Expected Discharge Date: 05/31/19                 DME Agency: NA       HH Arranged: NA          Prior Living Arrangements/Services Living arrangements for the past 2 months: Single Family Home Lives with:: Spouse Patient language and need for interpreter reviewed:: Yes Do you feel safe going back to the place where you live?: Yes      Need for Family Participation in Patient Care: Yes (Comment) Care giver support system in place?: Yes (comment) Current home services: DME(has a cane) Criminal Activity/Legal Involvement Pertinent to Current Situation/Hospitalization: No - Comment as needed  Activities of Daily Living Home Assistive Devices/Equipment: Cane (specify quad or straight) ADL Screening (condition at time of admission) Patient's cognitive ability adequate to safely complete daily activities?: Yes Is the patient deaf or have difficulty hearing?: No Does the patient have difficulty seeing, even when wearing glasses/contacts?: No Does the patient have difficulty concentrating, remembering, or making  decisions?: No Patient able to express need for assistance with ADLs?: Yes Does the patient have difficulty dressing or bathing?: No Independently performs ADLs?: Yes (appropriate for developmental age) Does the patient have difficulty walking or climbing stairs?: Yes Weakness of Legs: Both Weakness of Arms/Hands: None  Permission Sought/Granted                  Emotional Assessment Appearance:: Appears stated age Attitude/Demeanor/Rapport: Engaged Affect (typically observed): Appropriate Orientation: : Oriented to  Time, Oriented to Situation, Oriented to Place, Oriented to Self Alcohol / Substance Use: Not Applicable Psych Involvement: No (comment)  Admission diagnosis:  Shortness of breath [R06.02] Peripheral edema [R60.9] Elevated troponin [R77.8] Acute CHF (congestive heart failure) ([place]) [I50.9] Patient Active Problem List   Diagnosis Date Noted  . Acute CHF (congestive heart failure) ([place]) 05/25/2019  . Non-ST elevation MI (NSTEMI) ([place]) 05/25/2019  . Elevated troponin   . CKD stage G4/A1, GFR 15-29 and albumin creatinine ratio <30 mg/g ([facility]) 05/17/2019  . Anemia associated with stage 4 chronic renal failure ([place]) 05/17/2019  . Malignant neoplasm of upper-outer quadrant of left breast in female, estrogen receptor positive ([place]) 03/02/2019  . Aortic stenosis 02/14/2019  . Renal insufficiency 12/29/2018  . Mitral regurgitation moderate to severe based on echocardiogram in summer 2020 11/04/2018  . Paroxysmal atrial fibrillation ([place]) 10/11/2018  . Hyperlipidemia associated with type 2 diabetes mellitus ([place]) 08/16/2018  . Type 2 diabetes mellitus with other circulatory complications ([place]) 20/94/7096  . Hypothyroid 08/16/2018  .  HTN (hypertension) 08/16/2018  . CAD (coronary artery disease) 08/16/2018  . History of MI (myocardial infarction) 08/16/2018   PCP:  Midge Minium, MD Pharmacy:   [facility], [place] [address] Phone: [phone] Fax: [phone]     Social Determinants of Health (SDOH) Interventions    Readmission Risk Interventions Readmission Risk Prevention Plan 05/31/2019  Transportation Screening Complete  PCP or Specialist Appt within 3-5 Days Complete  HRI or [place] Complete  Social Work Consult for [place] Planning/Counseling Complete  Palliative Care Screening Not Applicable  Medication Review Press photographer) Complete

## 2019-05-31 NOTE — Care Management Important Message (Signed)
Important Message  Patient Details  Name: Lauren Walker MRN: [id number] Date of Birth: 11/19/1929   Medicare Important Message Given:  Yes     [name] 05/31/2019, 9:38 AM

## 2019-05-31 NOTE — Progress Notes (Addendum)
Telemetry reviewed. SB/SR 50's overnight, 60's otherwise, intermittently has PVCs No AF/tachycardia, pauses  Continue amiodarone 200mg  BID for 2 weeks, then daily  Hopefully we can avoid pacing No plans for PPM at this time  Tommye Standard, PA-C

## 2019-06-01 ENCOUNTER — Telehealth: Payer: Self-pay

## 2019-06-01 NOTE — Telephone Encounter (Signed)
Transition Care Management Follow-up Telephone Call  Date of discharge and from where: [facility] 05/31/19  How have you been since you were released from the hospital? Stable   Any questions or concerns? Concerns with problems donning TED hose.  States that she has bruising from wearing but no open areas.  Reviewed with her daily weighing and diet recommendations.   Items Reviewed:  Did the pt receive and understand the discharge instructions provided? Yes   Medications obtained and verified? Yes   Any new allergies since your discharge? No   Dietary orders reviewed? Yes  Do you have support at home? Yes   Functional Questionnaire: (I = Independent and D = Dependent) ADLs: I  Bathing/Dressing- I  Meal Prep- I  Eating- I  Maintaining continence- I  Transferring/Ambulation- I  Managing Meds- I  Follow up appointments reviewed:   PCP Hospital f/u appt confirmed? Yes  Scheduled to see Dr. Birdie Riddle on 06/07/19 @ 11.  [facility] f/u appt confirmed? Yes  Scheduled to see Cardiology on 07/06/19 @ 155.  Are transportation arrangements needed? No   If their condition worsens, is the pt aware to call PCP or go to the Emergency Dept.? Yes  Was the patient provided with contact information for the PCP's office or ED? Yes  Was to pt encouraged to call back with questions or concerns? Yes

## 2019-06-07 ENCOUNTER — Other Ambulatory Visit: Payer: Self-pay

## 2019-06-07 ENCOUNTER — Ambulatory Visit (INDEPENDENT_AMBULATORY_CARE_PROVIDER_SITE_OTHER): Payer: Medicare Other | Admitting: Family Medicine

## 2019-06-07 ENCOUNTER — Encounter: Payer: Self-pay | Admitting: Family Medicine

## 2019-06-07 VITALS — BP 131/82 | HR 49 | Temp 97.9°F | Resp 16 | Ht 64.0 in | Wt 166.5 lb

## 2019-06-07 DIAGNOSIS — I5041 Acute combined systolic (congestive) and diastolic (congestive) heart failure: Secondary | ICD-10-CM | POA: Diagnosis not present

## 2019-06-07 DIAGNOSIS — I480 Paroxysmal atrial fibrillation: Secondary | ICD-10-CM | POA: Diagnosis not present

## 2019-06-07 DIAGNOSIS — N184 Chronic kidney disease, stage 4 (severe): Secondary | ICD-10-CM | POA: Diagnosis not present

## 2019-06-07 LAB — BASIC METABOLIC PANEL
BUN: 50 mg/dL — ABNORMAL HIGH (ref 6–23)
CO2: 30 mEq/L (ref 19–32)
Calcium: 9.1 mg/dL (ref 8.4–10.5)
Chloride: 103 mEq/L (ref 96–112)
Creatinine, Ser: 2.07 mg/dL — ABNORMAL HIGH (ref 0.40–1.20)
GFR: 22.59 mL/min — ABNORMAL LOW (ref >60.00)
Glucose, Bld: 105 mg/dL — ABNORMAL HIGH (ref 70–99)
Potassium: 4.3 mEq/L (ref 3.5–5.1)
Sodium: 140 mEq/L (ref 135–145)

## 2019-06-07 NOTE — Assessment & Plan Note (Signed)
Check BMP as pt's diuretic was switched to Torsemide.

## 2019-06-07 NOTE — Patient Instructions (Addendum)
Follow up as needed or as scheduled We'll notify you of your lab results and make any changes if needed No med changes at this time I'm SO glad that you are feeling better! Call with any questions or concerns Stay Safe!  Stay Healthy!

## 2019-06-07 NOTE — Assessment & Plan Note (Signed)
Currently asymptomatic.  So far tolerating Amiodarone w/o difficulty.  Symptomatically feels much better since stopping Coreg.

## 2019-06-07 NOTE — Assessment & Plan Note (Signed)
Currently doing well on Torsemide.  No evidence of volume overload.  Has appt upcoming w/ Cards.  Check BMP.

## 2019-06-07 NOTE — Progress Notes (Signed)
   Subjective:    Patient ID: Lauren Walker, female    DOB: August 03, 1929, 86 y.o.   MRN: [id number]  [facility] f/u- pt was admitted 4/6-4/13 w/ increased SOB.  She had increased LE edema for the week preceding admission and on admission had pleural effusion, pulmonary edema, elevated troponin and BNP.  Sxs improved w/ IV lasix.  She had an EP consult due to cardiac pauses/bradycardia and cath/TEE/pacemaker were discussed but ultimately not done due to age and comorbidities.  ECHO showed EF 40-45%.  Was transitioned from IV lasix to Torsemide 20mg  daily.  Her pleural effusion was drained w/ thoracentesis- 525ml removed.  Due to her sinus pauses, her Coreg was d/c'd and she was started on Amiodarone for PAF.  Hydralazine, Lasix, and Fenofibrate also stopped.  Pt is down 10 lbs since last visit.  Reports feeling 'much better.  Better than I have felt in a long time'.  Denies SOB, energy is better, swelling is much less.  No longer feeling afib flutters  Reviewed H&P, notes, labs, images, and D/C summary.  Med rec was performed.   Review of Systems For ROS see HPI   This visit occurred during the SARS-CoV-2 public health emergency.  Safety protocols were in place, including screening questions prior to the visit, additional usage of staff PPE, and extensive cleaning of exam room while observing appropriate contact time as indicated for disinfecting solutions.       Objective:   Physical Exam Vitals reviewed.  Constitutional:      General: She is not in acute distress.    Appearance: Normal appearance. She is well-developed.  HENT:     Head: Normocephalic and atraumatic.  Eyes:     Conjunctiva/sclera: Conjunctivae normal.     Pupils: Pupils are equal, round, and reactive to light.  Neck:     Thyroid: No thyromegaly.  Cardiovascular:     Rate and Rhythm: Normal rate and regular rhythm.     Pulses: Normal pulses.     Heart sounds: Murmur present.  Pulmonary:     Effort: Pulmonary effort  is normal. No respiratory distress.     Breath sounds: Normal breath sounds.  Abdominal:     General: There is no distension.     Palpations: Abdomen is soft.     Tenderness: There is no abdominal tenderness.  Musculoskeletal:     Cervical back: Normal range of motion and neck supple.     Right lower leg: Edema (trace) present.     Left lower leg: Edema (trace) present.  Lymphadenopathy:     Cervical: No cervical adenopathy.  Skin:    General: Skin is warm and dry.  Neurological:     Mental Status: She is alert and oriented to person, place, and time.  Psychiatric:        Behavior: Behavior normal.           Assessment & Plan:

## 2019-06-08 ENCOUNTER — Encounter: Payer: Self-pay | Admitting: General Practice

## 2019-06-16 ENCOUNTER — Other Ambulatory Visit: Payer: Self-pay | Admitting: Oncology

## 2019-06-17 ENCOUNTER — Inpatient Hospital Stay: Payer: Medicare Other

## 2019-06-17 ENCOUNTER — Other Ambulatory Visit: Payer: Self-pay

## 2019-06-17 ENCOUNTER — Encounter (HOSPITAL_COMMUNITY): Payer: Self-pay

## 2019-06-17 ENCOUNTER — Emergency Department (HOSPITAL_COMMUNITY): Payer: Medicare Other

## 2019-06-17 ENCOUNTER — Inpatient Hospital Stay (HOSPITAL_COMMUNITY)
Admission: EM | Admit: 2019-06-17 | Discharge: 2019-06-21 | DRG: 227 | Disposition: A | Discharge status: Home / Self Care | Payer: Medicare Other | Attending: Internal Medicine | Admitting: Internal Medicine

## 2019-06-17 VITALS — BP 160/76 | HR 72 | Temp 98.5°F | Resp 18

## 2019-06-17 DIAGNOSIS — N184 Chronic kidney disease, stage 4 (severe): Secondary | ICD-10-CM

## 2019-06-17 DIAGNOSIS — I472 Ventricular tachycardia, unspecified: Secondary | ICD-10-CM

## 2019-06-17 DIAGNOSIS — I252 Old myocardial infarction: Secondary | ICD-10-CM

## 2019-06-17 DIAGNOSIS — I130 Hypertensive heart and chronic kidney disease with heart failure and stage 1 through stage 4 chronic kidney disease, or unspecified chronic kidney disease: Secondary | ICD-10-CM | POA: Diagnosis present

## 2019-06-17 DIAGNOSIS — Z7982 Long term (current) use of aspirin: Secondary | ICD-10-CM

## 2019-06-17 DIAGNOSIS — Z7901 Long term (current) use of anticoagulants: Secondary | ICD-10-CM

## 2019-06-17 DIAGNOSIS — I255 Ischemic cardiomyopathy: Secondary | ICD-10-CM | POA: Diagnosis present

## 2019-06-17 DIAGNOSIS — I340 Nonrheumatic mitral (valve) insufficiency: Secondary | ICD-10-CM | POA: Diagnosis present

## 2019-06-17 DIAGNOSIS — Z20822 Contact with and (suspected) exposure to covid-19: Secondary | ICD-10-CM | POA: Diagnosis present

## 2019-06-17 DIAGNOSIS — R9431 Abnormal electrocardiogram [ECG] [EKG]: Secondary | ICD-10-CM

## 2019-06-17 DIAGNOSIS — D631 Anemia in chronic kidney disease: Secondary | ICD-10-CM

## 2019-06-17 DIAGNOSIS — E1159 Type 2 diabetes mellitus with other circulatory complications: Secondary | ICD-10-CM | POA: Diagnosis present

## 2019-06-17 DIAGNOSIS — E1169 Type 2 diabetes mellitus with other specified complication: Secondary | ICD-10-CM

## 2019-06-17 DIAGNOSIS — N179 Acute kidney failure, unspecified: Secondary | ICD-10-CM | POA: Diagnosis not present

## 2019-06-17 DIAGNOSIS — I495 Sick sinus syndrome: Principal | ICD-10-CM | POA: Diagnosis present

## 2019-06-17 DIAGNOSIS — I350 Nonrheumatic aortic (valve) stenosis: Secondary | ICD-10-CM

## 2019-06-17 DIAGNOSIS — Z170 Estrogen receptor positive status [ER+]: Secondary | ICD-10-CM

## 2019-06-17 DIAGNOSIS — R001 Bradycardia, unspecified: Secondary | ICD-10-CM

## 2019-06-17 DIAGNOSIS — I480 Paroxysmal atrial fibrillation: Secondary | ICD-10-CM | POA: Diagnosis not present

## 2019-06-17 DIAGNOSIS — E1165 Type 2 diabetes mellitus with hyperglycemia: Secondary | ICD-10-CM | POA: Diagnosis not present

## 2019-06-17 DIAGNOSIS — R7401 Elevation of levels of liver transaminase levels: Secondary | ICD-10-CM

## 2019-06-17 DIAGNOSIS — Z8249 Family history of ischemic heart disease and other diseases of the circulatory system: Secondary | ICD-10-CM | POA: Diagnosis not present

## 2019-06-17 DIAGNOSIS — I25119 Atherosclerotic heart disease of native coronary artery with unspecified angina pectoris: Secondary | ICD-10-CM | POA: Diagnosis not present

## 2019-06-17 DIAGNOSIS — C50412 Malignant neoplasm of upper-outer quadrant of left female breast: Secondary | ICD-10-CM | POA: Insufficient documentation

## 2019-06-17 DIAGNOSIS — I083 Combined rheumatic disorders of mitral, aortic and tricuspid valves: Secondary | ICD-10-CM | POA: Diagnosis present

## 2019-06-17 DIAGNOSIS — E039 Hypothyroidism, unspecified: Secondary | ICD-10-CM | POA: Diagnosis present

## 2019-06-17 DIAGNOSIS — E1122 Type 2 diabetes mellitus with diabetic chronic kidney disease: Secondary | ICD-10-CM | POA: Diagnosis present

## 2019-06-17 DIAGNOSIS — Z91048 Other nonmedicinal substance allergy status: Secondary | ICD-10-CM

## 2019-06-17 DIAGNOSIS — Z87891 Personal history of nicotine dependence: Secondary | ICD-10-CM | POA: Diagnosis not present

## 2019-06-17 DIAGNOSIS — I2510 Atherosclerotic heart disease of native coronary artery without angina pectoris: Secondary | ICD-10-CM | POA: Diagnosis present

## 2019-06-17 DIAGNOSIS — E538 Deficiency of other specified B group vitamins: Secondary | ICD-10-CM | POA: Insufficient documentation

## 2019-06-17 DIAGNOSIS — I10 Essential (primary) hypertension: Secondary | ICD-10-CM | POA: Diagnosis present

## 2019-06-17 DIAGNOSIS — N189 Chronic kidney disease, unspecified: Secondary | ICD-10-CM | POA: Diagnosis present

## 2019-06-17 DIAGNOSIS — E785 Hyperlipidemia, unspecified: Secondary | ICD-10-CM | POA: Diagnosis present

## 2019-06-17 DIAGNOSIS — R55 Syncope and collapse: Secondary | ICD-10-CM | POA: Diagnosis not present

## 2019-06-17 DIAGNOSIS — I5042 Chronic combined systolic (congestive) and diastolic (congestive) heart failure: Secondary | ICD-10-CM | POA: Diagnosis present

## 2019-06-17 DIAGNOSIS — Z79899 Other long term (current) drug therapy: Secondary | ICD-10-CM

## 2019-06-17 DIAGNOSIS — Z95810 Presence of automatic (implantable) cardiac defibrillator: Secondary | ICD-10-CM

## 2019-06-17 DIAGNOSIS — Z79890 Hormone replacement therapy (postmenopausal): Secondary | ICD-10-CM

## 2019-06-17 DIAGNOSIS — E038 Other specified hypothyroidism: Secondary | ICD-10-CM

## 2019-06-17 HISTORY — DX: Elevation of levels of liver transaminase levels: R74.01

## 2019-06-17 HISTORY — DX: Ventricular tachycardia, unspecified: I47.20

## 2019-06-17 HISTORY — DX: Bradycardia, unspecified: R00.1

## 2019-06-17 HISTORY — DX: Ventricular tachycardia: I47.2

## 2019-06-17 LAB — VITAMIN B12: Vitamin B-12: 1136 pg/mL — ABNORMAL HIGH (ref 180–914)

## 2019-06-17 LAB — I-STAT CHEM 8, ED
BUN: 38 mg/dL — ABNORMAL HIGH (ref 8–23)
Calcium, Ion: 1.11 mmol/L — ABNORMAL LOW (ref 1.15–1.40)
Chloride: 102 mmol/L (ref 98–111)
Creatinine, Ser: 2.5 mg/dL — ABNORMAL HIGH (ref 0.44–1.00)
Glucose, Bld: 297 mg/dL — ABNORMAL HIGH (ref 70–99)
HCT: 30 % — ABNORMAL LOW (ref 36.0–46.0)
Hemoglobin: 10.2 g/dL — ABNORMAL LOW (ref 12.0–15.0)
Potassium: 4.1 mmol/L (ref 3.5–5.1)
Sodium: 138 mmol/L (ref 135–145)
TCO2: 22 mmol/L (ref 22–32)

## 2019-06-17 LAB — CBC WITH DIFFERENTIAL/PLATELET
Abs Immature Granulocytes: 0.02 10*3/uL (ref 0.00–0.07)
Abs Immature Granulocytes: 0.11 10*3/uL — ABNORMAL HIGH (ref 0.00–0.07)
Basophils Absolute: 0 10*3/uL (ref 0.0–0.1)
Basophils Absolute: 0.1 10*3/uL (ref 0.0–0.1)
Basophils Relative: 1 %
Basophils Relative: 1 %
Eosinophils Absolute: 0.2 10*3/uL (ref 0.0–0.5)
Eosinophils Absolute: 0.2 10*3/uL (ref 0.0–0.5)
Eosinophils Relative: 3 %
Eosinophils Relative: 1 %
HCT: 30.4 % — ABNORMAL LOW (ref 36.0–46.0)
HCT: 33.3 % — ABNORMAL LOW (ref 36.0–46.0)
Hemoglobin: 9.6 g/dL — ABNORMAL LOW (ref 12.0–15.0)
Hemoglobin: 10.1 g/dL — ABNORMAL LOW (ref 12.0–15.0)
Immature Granulocytes: 0 %
Immature Granulocytes: 1 %
Lymphocytes Relative: 20 %
Lymphocytes Relative: 35 %
Lymphs Abs: 1.3 10*3/uL (ref 0.7–4.0)
Lymphs Abs: 3.9 10*3/uL (ref 0.7–4.0)
MCH: 30.3 pg (ref 26.0–34.0)
MCH: 30.5 pg (ref 26.0–34.0)
MCHC: 31.6 g/dL (ref 30.0–36.0)
MCHC: 30.3 g/dL (ref 30.0–36.0)
MCV: 95.9 fL (ref 80.0–100.0)
MCV: 100.6 fL — ABNORMAL HIGH (ref 80.0–100.0)
Monocytes Absolute: 0.6 10*3/uL (ref 0.1–1.0)
Monocytes Absolute: 0.6 10*3/uL (ref 0.1–1.0)
Monocytes Relative: 9 %
Monocytes Relative: 5 %
Neutro Abs: 4.6 10*3/uL (ref 1.7–7.7)
Neutro Abs: 6.4 10*3/uL (ref 1.7–7.7)
Neutrophils Relative %: 67 %
Neutrophils Relative %: 57 %
Platelets: 237 10*3/uL (ref 150–400)
Platelets: 326 10*3/uL (ref 150–400)
RBC: 3.17 MIL/uL — ABNORMAL LOW (ref 3.87–5.11)
RBC: 3.31 MIL/uL — ABNORMAL LOW (ref 3.87–5.11)
RDW: 14.1 % (ref 11.5–15.5)
RDW: 14.1 % (ref 11.5–15.5)
WBC: 6.7 10*3/uL (ref 4.0–10.5)
WBC: 11.2 10*3/uL — ABNORMAL HIGH (ref 4.0–10.5)
nRBC: 0 % (ref 0.0–0.2)
nRBC: 0 % (ref 0.0–0.2)

## 2019-06-17 LAB — COMPREHENSIVE METABOLIC PANEL
ALT: 59 U/L — ABNORMAL HIGH (ref 0–44)
AST: 190 U/L — ABNORMAL HIGH (ref 15–41)
Albumin: 3 g/dL — ABNORMAL LOW (ref 3.5–5.0)
Alkaline Phosphatase: 106 U/L (ref 38–126)
Anion gap: 19 — ABNORMAL HIGH (ref 5–15)
BUN: 42 mg/dL — ABNORMAL HIGH (ref 8–23)
CO2: 19 mmol/L — ABNORMAL LOW (ref 22–32)
Calcium: 8.6 mg/dL — ABNORMAL LOW (ref 8.9–10.3)
Chloride: 101 mmol/L (ref 98–111)
Creatinine, Ser: 2.48 mg/dL — ABNORMAL HIGH (ref 0.44–1.00)
GFR calc Af Amer: 19 mL/min — ABNORMAL LOW (ref >60)
GFR calc non Af Amer: 17 mL/min — ABNORMAL LOW (ref >60)
Glucose, Bld: 316 mg/dL — ABNORMAL HIGH (ref 70–99)
Potassium: 4.2 mmol/L (ref 3.5–5.1)
Sodium: 139 mmol/L (ref 135–145)
Total Bilirubin: 0.7 mg/dL (ref 0.3–1.2)
Total Protein: 5.5 g/dL — ABNORMAL LOW (ref 6.5–8.1)

## 2019-06-17 LAB — POCT I-STAT EG7
Acid-Base Excess: 0 mmol/L (ref 0.0–2.0)
Bicarbonate: 25.9 mmol/L (ref 20.0–28.0)
Calcium, Ion: 1.22 mmol/L (ref 1.15–1.40)
HCT: 29 % — ABNORMAL LOW (ref 36.0–46.0)
Hemoglobin: 9.9 g/dL — ABNORMAL LOW (ref 12.0–15.0)
O2 Saturation: 87 %
Potassium: 3.9 mmol/L (ref 3.5–5.1)
Sodium: 141 mmol/L (ref 135–145)
TCO2: 27 mmol/L (ref 22–32)
pCO2, Ven: 46.1 mmHg (ref 44.0–60.0)
pH, Ven: 7.358 (ref 7.250–7.430)
pO2, Ven: 55 mmHg — ABNORMAL HIGH (ref 32.0–45.0)

## 2019-06-17 LAB — FOLATE: Folate: 51.2 ng/mL (ref >5.9)

## 2019-06-17 LAB — RESPIRATORY PANEL BY RT PCR (FLU A&B, COVID)
Influenza A by PCR: NEGATIVE
Influenza B by PCR: NEGATIVE
SARS Coronavirus 2 by RT PCR: NEGATIVE

## 2019-06-17 LAB — SAVE SMEAR(SSMR), FOR PROVIDER SLIDE REVIEW

## 2019-06-17 LAB — TROPONIN I (HIGH SENSITIVITY)
Troponin I (High Sensitivity): 35 ng/L — ABNORMAL HIGH (ref <18)
Troponin I (High Sensitivity): 311 ng/L (ref <18)

## 2019-06-17 LAB — MAGNESIUM: Magnesium: 2.4 mg/dL (ref 1.7–2.4)

## 2019-06-17 LAB — RETICULOCYTES
Immature Retic Fract: 7.9 % (ref 2.3–15.9)
RBC.: 3.2 MIL/uL — ABNORMAL LOW (ref 3.87–5.11)
Retic Count, Absolute: 81.3 10*3/uL (ref 19.0–186.0)
Retic Ct Pct: 2.5 % (ref 0.4–3.1)

## 2019-06-17 MED ORDER — DARBEPOETIN ALFA 200 MCG/0.4ML IJ SOSY
200.0000 ug | PREFILLED_SYRINGE | Freq: Once | INTRAMUSCULAR | Status: AC
  Administered 2019-06-17: 15:00:00 200 ug via SUBCUTANEOUS

## 2019-06-17 MED ORDER — VITAMIN D 25 MCG (1000 UNIT) PO TABS
2000.0000 [IU] | ORAL_TABLET | Freq: Every day | ORAL | Status: DC
  Administered 2019-06-18 – 2019-06-21 (×5): 2000 [IU] via ORAL
  Filled 2019-06-17 (×5): qty 2

## 2019-06-17 MED ORDER — INSULIN ASPART 100 UNIT/ML ~~LOC~~ SOLN
0.0000 [IU] | Freq: Three times a day (TID) | SUBCUTANEOUS | Status: DC
  Administered 2019-06-18: 2 [IU] via SUBCUTANEOUS
  Administered 2019-06-19: 17:00:00 5 [IU] via SUBCUTANEOUS
  Administered 2019-06-20: 12:00:00 3 [IU] via SUBCUTANEOUS
  Administered 2019-06-20: 2 [IU] via SUBCUTANEOUS
  Administered 2019-06-21: 3 [IU] via SUBCUTANEOUS

## 2019-06-17 MED ORDER — FLUTICASONE PROPIONATE 50 MCG/ACT NA SUSP
2.0000 | Freq: Every evening | NASAL | Status: DC | PRN
  Filled 2019-06-17: qty 16

## 2019-06-17 MED ORDER — FERROUS SULFATE 325 (65 FE) MG PO TABS
325.0000 mg | ORAL_TABLET | Freq: Every day | ORAL | Status: DC
  Administered 2019-06-18 – 2019-06-21 (×4): 325 mg via ORAL
  Filled 2019-06-17 (×3): qty 1

## 2019-06-17 MED ORDER — ONDANSETRON HCL 4 MG PO TABS: 4.0000 mg | ORAL_TABLET | Freq: Four times a day (QID) | ORAL | Status: DC | PRN

## 2019-06-17 MED ORDER — MAGNESIUM OXIDE 400 (241.3 MG) MG PO TABS
400.0000 mg | ORAL_TABLET | Freq: Every day | ORAL | Status: DC
  Administered 2019-06-18 – 2019-06-20 (×4): 400 mg via ORAL
  Filled 2019-06-17 (×4): qty 1

## 2019-06-17 MED ORDER — APIXABAN 2.5 MG PO TABS
2.5000 mg | ORAL_TABLET | Freq: Two times a day (BID) | ORAL | Status: DC
  Administered 2019-06-18 – 2019-06-19 (×5): 2.5 mg via ORAL
  Filled 2019-06-17 (×6): qty 1

## 2019-06-17 MED ORDER — ANASTROZOLE 1 MG PO TABS
1.0000 mg | ORAL_TABLET | Freq: Every day | ORAL | Status: DC
  Administered 2019-06-18 – 2019-06-20 (×4): 1 mg via ORAL
  Filled 2019-06-17 (×5): qty 1

## 2019-06-17 MED ORDER — TORSEMIDE 20 MG PO TABS: 20.0000 mg | ORAL_TABLET | Freq: Every day | ORAL | Status: DC

## 2019-06-17 MED ORDER — AZELASTINE-FLUTICASONE 137-50 MCG/ACT NA SUSP: 1.0000 | Freq: Two times a day (BID) | NASAL | Status: DC

## 2019-06-17 MED ORDER — BIOTIN 1 MG PO CAPS: 1.0000 mg | ORAL_CAPSULE | Freq: Every day | ORAL | Status: DC

## 2019-06-17 MED ORDER — ISOSORBIDE MONONITRATE ER 30 MG PO TB24
30.0000 mg | ORAL_TABLET | Freq: Two times a day (BID) | ORAL | Status: DC
  Administered 2019-06-18 (×3): 30 mg via ORAL
  Filled 2019-06-17 (×3): qty 1

## 2019-06-17 MED ORDER — ACETAMINOPHEN 325 MG PO TABS
650.0000 mg | ORAL_TABLET | Freq: Four times a day (QID) | ORAL | Status: DC | PRN
  Administered 2019-06-19 – 2019-06-21 (×2): 650 mg via ORAL
  Filled 2019-06-17 (×2): qty 2

## 2019-06-17 MED ORDER — ACETAMINOPHEN 650 MG RE SUPP: 650.0000 mg | Freq: Four times a day (QID) | RECTAL | Status: DC | PRN

## 2019-06-17 MED ORDER — DARBEPOETIN ALFA 200 MCG/0.4ML IJ SOSY
PREFILLED_SYRINGE | INTRAMUSCULAR | Status: AC
  Filled 2019-06-17: qty 0.4

## 2019-06-17 MED ORDER — ATORVASTATIN CALCIUM 40 MG PO TABS
40.0000 mg | ORAL_TABLET | Freq: Every day | ORAL | Status: DC
  Administered 2019-06-18 – 2019-06-20 (×4): 40 mg via ORAL
  Filled 2019-06-17 (×4): qty 1

## 2019-06-17 MED ORDER — INSULIN ASPART 100 UNIT/ML ~~LOC~~ SOLN: 0.0000 [IU] | Freq: Every day | SUBCUTANEOUS | Status: DC

## 2019-06-17 MED ORDER — ASPIRIN EC 81 MG PO TBEC
81.0000 mg | DELAYED_RELEASE_TABLET | Freq: Every day | ORAL | Status: DC
  Administered 2019-06-18 – 2019-06-21 (×3): 81 mg via ORAL
  Filled 2019-06-17 (×3): qty 1

## 2019-06-17 MED ORDER — LEVOTHYROXINE SODIUM 75 MCG PO TABS
150.0000 ug | ORAL_TABLET | Freq: Every day | ORAL | Status: DC
  Administered 2019-06-18 – 2019-06-21 (×4): 150 ug via ORAL
  Filled 2019-06-17 (×4): qty 2

## 2019-06-17 MED ORDER — VITAMIN D3 50 MCG (2000 UT) PO TABS: 2000.0000 [IU] | ORAL_TABLET | Freq: Every day | ORAL | Status: DC

## 2019-06-17 MED ORDER — ONDANSETRON HCL 4 MG/2ML IJ SOLN: 4.0000 mg | Freq: Four times a day (QID) | INTRAMUSCULAR | Status: DC | PRN

## 2019-06-17 NOTE — ED Provider Notes (Addendum)
[facility] EMERGENCY DEPARTMENT Provider Note   CSN: [id number] Arrival date & time: 06/17/19  1907     History Chief Complaint  Patient presents with  . Tachycardia    pt initial hr 200s, cardiovert @ [place] is a 86 y.o. female.  HPI 86 year old female presents with ventricular tachycardia.  History is mostly by EMS but somewhat by the patient.  She apparently acutely became ill and did not feel well at dinner tonight.  EMS was called after about 30 minutes.  Found to be in ventricular tachycardia with heart rate in the 200s.  She was given 150 mg amiodarone over 10 minutes with no response.  Her blood pressure was in the 90s and she was lethargic.  She was cardioverted at 100 J and this brought up her blood pressure, improved her mental status, and now she is in bradycardia.  Patient states that previous to this she was feeling okay.  Recently had an admission which she states had some similar problems but chart review indicates that she had some bradycardia and intermittent sinus pauses.  No chest pain now or earlier in the night but she did feel short of breath during the episode.  This is resolved.  Past Medical History:  Diagnosis Date  . Arthritis   . Chronic kidney disease   . Chronic systolic dysfunction of left ventricle   . Coronary artery disease   . Diabetes mellitus without complication ([place])   . Diverticulitis   . Hypertension   . Hypothyroidism   . Myocardial infarction ([place])   . Paroxysmal atrial fibrillation (HCC)   . Thyroid disease     Patient Active Problem List   Diagnosis Date Noted  . V-tach ([place]) 06/17/2019  . Sinus bradycardia 06/17/2019  . Transaminitis 06/17/2019  . Acute CHF (congestive heart failure) ([place]) 05/25/2019  . CKD stage G4/A1, GFR 15-29 and albumin creatinine ratio <30 mg/g (HCC) 05/17/2019  . Anemia associated with stage 4 chronic renal failure ([place]) 05/17/2019  . Malignant neoplasm of upper-outer  quadrant of left breast in female, estrogen receptor positive ([place]) 03/02/2019  . Aortic stenosis 02/14/2019  . Mitral regurgitation moderate to severe based on echocardiogram in summer 2020 11/04/2018  . Paroxysmal atrial fibrillation ([place]) 10/11/2018  . Hyperlipidemia associated with type 2 diabetes mellitus ([place]) 08/16/2018  . Type 2 diabetes mellitus with other circulatory complications ([place]) 78/[id number]/9629  . Hypothyroid 08/16/2018  . HTN (hypertension) 08/16/2018  . CAD (coronary artery disease) 08/16/2018  . History of MI (myocardial infarction) 08/16/2018    Past Surgical History:  Procedure Laterality Date  . APPENDECTOMY  1936  . IR THORACENTESIS ASP PLEURAL SPACE W/IMG GUIDE  05/27/2019  . PARS PLANA VITRECTOMY Right 12/04/2018   Procedure: RETINAL DETACHMENT REPAIR PPV 25 GAUGE WITH ENDO LASER AIR/FLUID EXCHANGE SF6 GAS INJECTION;  Surgeon: Jalene Mullet, MD;  Location: [place];  Service: Ophthalmology;  Laterality: Right;  . TONSILLECTOMY       OB History   No obstetric history on file.     Family History  Problem Relation Age of Onset  . Arthritis Mother   . Heart disease Mother   . Hypertension Mother   . Arthritis Father   . Heart attack Father   . Heart disease Father   . Arthritis Daughter   . Arthritis Son   . Depression Maternal Aunt   . Hyperlipidemia Maternal Aunt   . Hypertension Maternal Aunt     Social  History   Tobacco Use  . Smoking status: Former Research scientist (life sciences)  . Smokeless tobacco: Never Used  Substance Use Topics  . Alcohol use: Yes    Comment: Very rare  . Drug use: Not Currently    Home Medications Prior to Admission medications   Medication Sig Start Date End Date Taking? Authorizing Provider  acetaminophen (TYLENOL) 650 MG CR tablet Take 650 mg by mouth every 8 (eight) hours as needed for pain.   Yes [provider]  amiodarone (PACERONE) 200 MG tablet Take 1 tablet (200 mg total) by mouth 2 (two) times daily. 05/31/19  Yes Shelly Coss, MD  anastrozole (ARIMIDEX) 1 MG tablet TAKE 1 TABLET BY MOUTH EVERY DAY Patient taking differently: Take 1 mg by mouth at bedtime.  06/16/19  Yes Magrinat, Virgie Dad, MD  apixaban (ELIQUIS) 2.5 MG TABS tablet Take 1 tablet (2.5 mg total) by mouth 2 (two) times daily. 03/10/19  Yes Loel Dubonnet, NP  aspirin EC 81 MG tablet Take 81 mg by mouth daily.   Yes [provider]  atorvastatin (LIPITOR) 40 MG tablet Take 1 tablet (40 mg total) by mouth daily. Patient taking differently: Take 40 mg by mouth at bedtime.  03/02/19  Yes Midge Minium, MD  Azelastine-Fluticasone [phone] MCG/ACT SUSP Place 1 spray into both nostrils 2 (two) times daily.    Yes [provider]  b complex vitamins capsule Take 1 capsule by mouth daily.   Yes [provider]  Biotin 1 MG CAPS Take 1 mg by mouth daily.    Yes [provider]  Cholecalciferol (VITAMIN D3) 50 MCG (2000 UT) TABS Take 2,000 Units by mouth at bedtime.    Yes [provider]  Ferrous Sulfate (IRON) 325 (65 Fe) MG TABS Take 325 mg by mouth daily with breakfast.    Yes [provider]  fluticasone (FLONASE) 50 MCG/ACT nasal spray SPRAY 2 SPRAYS INTO EACH NOSTRIL EVERY DAY Patient taking differently: Place 2 sprays into both nostrils at bedtime as needed for allergies or rhinitis.  03/04/19  Yes Midge Minium, MD  isosorbide mononitrate (IMDUR) 30 MG 24 hr tablet Take 30 mg by mouth 2 (two) times daily.   Yes [provider]  levothyroxine (SYNTHROID) 150 MCG tablet Take 1 tablet (150 mcg total) by mouth daily before breakfast. 03/02/19  Yes Midge Minium, MD  magnesium oxide (MAG-OX) 400 MG tablet Take 400 mg by mouth at bedtime.    Yes [provider]  nitroGLYCERIN (NITROSTAT) 0.4 MG SL tablet Place 0.4 mg under the tongue every 5 (five) minutes as needed for chest pain.   Yes [provider]  quinapril (ACCUPRIL) 20 MG tablet Take 10 mg by mouth in the  morning and at bedtime.  12/23/18  Yes [provider]  torsemide (DEMADEX) 20 MG tablet Take 1 tablet (20 mg total) by mouth daily. 06/01/19  Yes Shelly Coss, MD  vitamin C (ASCORBIC ACID) 500 MG tablet Take 500 mg by mouth daily.   Yes [provider]  isosorbide mononitrate (IMDUR) 60 MG 24 hr tablet Take 1 tablet (60 mg total) by mouth daily. 06/01/19   Shelly Coss, MD    Allergies    Tape  Review of Systems   Review of Systems  Respiratory: Positive for shortness of breath.   Cardiovascular: Negative for chest pain and palpitations.  Gastrointestinal: Negative for diarrhea and vomiting.  Neurological: Negative for light-headedness.  All other systems reviewed and are  negative.   Physical Exam Updated Vital Signs BP (!) 169/51   Pulse (!) 56   Temp 97.7 F (36.5 C) (Oral)   Resp (!) 21   Ht 5\' 4"  (1.626 m)   Wt 75.3 kg   SpO2 98%   BMI 28.49 kg/m   Physical Exam Vitals and nursing note reviewed.  Constitutional:      General: She is not in acute distress.    Appearance: She is well-developed. She is not ill-appearing or diaphoretic.  HENT:     Head: Normocephalic and atraumatic.     Right Ear: External ear normal.     Left Ear: External ear normal.     Nose: Nose normal.  Eyes:     General:        Right eye: No discharge.        Left eye: No discharge.  Cardiovascular:     Rate and Rhythm: Regular rhythm. Bradycardia present.     Heart sounds: Normal heart sounds.  Pulmonary:     Effort: Pulmonary effort is normal.     Breath sounds: Normal breath sounds.  Abdominal:     Palpations: Abdomen is soft.     Tenderness: There is no abdominal tenderness.  Skin:    General: Skin is warm and dry.  Neurological:     Mental Status: She is alert.  Psychiatric:        Mood and Affect: Mood is not anxious.     ED Results / Procedures / Treatments   Labs (all labs ordered are listed, but only abnormal results are displayed) Labs Reviewed    COMPREHENSIVE METABOLIC PANEL - Abnormal; Notable for the following components:      Result Value   CO2 19 (*)    Glucose, Bld 316 (*)    BUN 42 (*)    Creatinine, Ser 2.48 (*)    Calcium 8.6 (*)    Total Protein 5.5 (*)    Albumin 3.0 (*)    AST 190 (*)    ALT 59 (*)    GFR calc non Af Amer 17 (*)    GFR calc Af Amer 19 (*)    Anion gap 19 (*)    All other components within normal limits  CBC WITH DIFFERENTIAL/PLATELET - Abnormal; Notable for the following components:   WBC 11.2 (*)    RBC 3.31 (*)    Hemoglobin 10.1 (*)    HCT 33.3 (*)    MCV 100.6 (*)    Abs Immature Granulocytes 0.11 (*)    All other components within normal limits  I-STAT CHEM 8, ED - Abnormal; Notable for the following components:   BUN 38 (*)    Creatinine, Ser 2.50 (*)    Glucose, Bld 297 (*)    Calcium, Ion 1.11 (*)    Hemoglobin 10.2 (*)    HCT 30.0 (*)    All other components within normal limits  POCT I-STAT EG7 - Abnormal; Notable for the following components:   pO2, Ven 55.0 (*)    HCT 29.0 (*)    Hemoglobin 9.9 (*)    All other components within normal limits  TROPONIN I (HIGH SENSITIVITY) - Abnormal; Notable for the following components:   Troponin I (High Sensitivity) 35 (*)    All other components within normal limits  RESPIRATORY PANEL BY RT PCR (FLU A&B, COVID)  MAGNESIUM  URINALYSIS, ROUTINE W REFLEX MICROSCOPIC  CBC  COMPREHENSIVE METABOLIC PANEL  TROPONIN I (HIGH SENSITIVITY)  EKG None  Radiology DG Chest Portable 1 View  Result Date: 06/17/2019 CLINICAL DATA:  Dizziness for 1 hour EXAM: PORTABLE CHEST 1 VIEW COMPARISON:  05/29/2019 FINDINGS: Cardiac shadow is stable. Aortic calcifications are again seen. Increasing left-sided effusion and likely underlying infiltrate is present. The right lung is clear. No bony abnormality is noted. IMPRESSION: Increasing left-sided effusion and likely underlying infiltrate. Electronically Signed   By: Inez Catalina M.D.   On: 06/17/2019  20:20    Procedures Procedures (including critical care time)  Medications Ordered in ED Medications  atorvastatin (LIPITOR) tablet 40 mg (has no administration in time range)  apixaban (ELIQUIS) tablet 2.5 mg (has no administration in time range)  aspirin EC tablet 81 mg (has no administration in time range)  anastrozole (ARIMIDEX) tablet 1 mg (has no administration in time range)  levothyroxine (SYNTHROID) tablet 150 mcg (has no administration in time range)  isosorbide mononitrate (IMDUR) 24 hr tablet 30 mg (has no administration in time range)  magnesium oxide (MAG-OX) tablet 400 mg (has no administration in time range)  ferrous sulfate tablet 325 mg (has no administration in time range)  fluticasone (FLONASE) 50 MCG/ACT nasal spray 2 spray (has no administration in time range)  Azelastine-Fluticasone 137-50 MCG/ACT SUSP 1 spray (has no administration in time range)  torsemide (DEMADEX) tablet 20 mg (has no administration in time range)  insulin aspart (novoLOG) injection 0-15 Units (has no administration in time range)  insulin aspart (novoLOG) injection 0-5 Units (has no administration in time range)  ondansetron (ZOFRAN) tablet 4 mg (has no administration in time range)    Or  ondansetron (ZOFRAN) injection 4 mg (has no administration in time range)  acetaminophen (TYLENOL) tablet 650 mg (has no administration in time range)    Or  acetaminophen (TYLENOL) suppository 650 mg (has no administration in time range)  cholecalciferol (VITAMIN D3) tablet 2,000 Units (has no administration in time range)    ED Course  I have reviewed the triage vital signs and the nursing notes.  Pertinent labs & imaging results that were available during my care of the patient were reviewed by me and considered in my medical decision making (see chart for details).  Clinical Course as of Jun 16 2236  Fri Jun 17, 2019  1934 I discussed patient's ECGs with Dr. Radford Pax.  Possibly this is ischemia but  also possibly just prolonged QTC.  Given she is asymptomatic, no indication for emergent Cath Lab but she will come see and help. No meds for now, no amiodarone   [SG]    Clinical Course User Index [SG] Sherwood Gambler, MD   MDM Rules/Calculators/A&P                      Since the patient's cardioversion she has been asymptomatic.  At this point, she has a prolonged QTC but no significant ventricular arrhythmia.  Vital signs are stable besides some bradycardia and she does not have any emergent electrolyte findings on lab review.  She does have hyperglycemia and anion gap acidosis, but her pH is nearly normal and this would argue against DKA.  Perhaps is related to shock from the transient ventricular tachycardia.  Cardiology consulted as above and hospitalist consulted for admission.  Dr. Alcario Drought to admit. Final Clinical Impression(s) / ED Diagnoses Final diagnoses:  Ventricular tachycardia [facility])    Rx / DC Orders ED Discharge Orders    None       Sherwood Gambler, MD 06/17/19 2236  Sherwood Gambler, MD 06/17/19 2238

## 2019-06-17 NOTE — H&P (Signed)
History and Physical    Lauren Walker UYQ:[id number] DOB: 03/15/1929 DOA: 06/17/2019  PCP: Midge Minium, MD  Patient coming from: Home  I have personally briefly reviewed patient's old medical records in [place]  Chief Complaint: Tachycardia  HPI: Lauren Walker is a 86 y.o. female with medical history significant of CAD, HFrEF, DM2, HTN, PAF, CKD 4.  Pt admitted earlier this month with CHF / NSTEMI, ultimately treated conservatively due to CKD.  Pt was in normal state of health at home today until she became acutely ill at dinner.  EMS called after 30 min, pt found to be in V.Tach with HR 200, BP 90s, pt lethargic.  150 amiodarone over 10 min given, no response.  Pt shocked with 100j, converted to S.brady / NSR.   ED Course: Remains NSR, BP normal, mental status normal, feels much better.  No CP now.   Review of Systems: As per HPI, otherwise all review of systems negative.  Past Medical History:  Diagnosis Date  . Arthritis   . Chronic kidney disease   . Chronic systolic dysfunction of left ventricle   . Coronary artery disease   . Diabetes mellitus without complication ([place])   . Diverticulitis   . Hypertension   . Hypothyroidism   . Myocardial infarction ([place])   . Paroxysmal atrial fibrillation (HCC)   . Thyroid disease     Past Surgical History:  Procedure Laterality Date  . APPENDECTOMY  1936  . IR THORACENTESIS ASP PLEURAL SPACE W/IMG GUIDE  05/27/2019  . PARS PLANA VITRECTOMY Right 12/04/2018   Procedure: RETINAL DETACHMENT REPAIR PPV 25 GAUGE WITH ENDO LASER AIR/FLUID EXCHANGE SF6 GAS INJECTION;  Surgeon: Jalene Mullet, MD;  Location: [place];  Service: Ophthalmology;  Laterality: Right;  . TONSILLECTOMY       reports that she has quit smoking. She has never used smokeless tobacco. She reports current alcohol use. She reports previous drug use.  Allergies  Allergen Reactions  . Tape Itching, Rash and Other (See Comments)    Reaction was from  the surgical tape from cyst removed    Family History  Problem Relation Age of Onset  . Arthritis Mother   . Heart disease Mother   . Hypertension Mother   . Arthritis Father   . Heart attack Father   . Heart disease Father   . Arthritis Daughter   . Arthritis Son   . Depression Maternal Aunt   . Hyperlipidemia Maternal Aunt   . Hypertension Maternal Aunt      Prior to Admission medications   Medication Sig Start Date End Date Taking? Authorizing Provider  acetaminophen (TYLENOL) 650 MG CR tablet Take 650 mg by mouth every 8 (eight) hours as needed for pain.   Yes [provider]  amiodarone (PACERONE) 200 MG tablet Take 1 tablet (200 mg total) by mouth 2 (two) times daily. 05/31/19  Yes Shelly Coss, MD  anastrozole (ARIMIDEX) 1 MG tablet TAKE 1 TABLET BY MOUTH EVERY DAY Patient taking differently: Take 1 mg by mouth at bedtime.  06/16/19  Yes Magrinat, Virgie Dad, MD  apixaban (ELIQUIS) 2.5 MG TABS tablet Take 1 tablet (2.5 mg total) by mouth 2 (two) times daily. 03/10/19  Yes Loel Dubonnet, NP  aspirin EC 81 MG tablet Take 81 mg by mouth daily.   Yes [provider]  atorvastatin (LIPITOR) 40 MG tablet Take 1 tablet (40 mg total) by mouth daily. Patient taking differently: Take 40 mg by  mouth at bedtime.  03/02/19  Yes Midge Minium, MD  Azelastine-Fluticasone [phone] MCG/ACT SUSP Place 1 spray into both nostrils 2 (two) times daily.    Yes [provider]  b complex vitamins capsule Take 1 capsule by mouth daily.   Yes [provider]  Biotin 1 MG CAPS Take 1 mg by mouth daily.    Yes [provider]  Cholecalciferol (VITAMIN D3) 50 MCG (2000 UT) TABS Take 2,000 Units by mouth at bedtime.    Yes [provider]  Ferrous Sulfate (IRON) 325 (65 Fe) MG TABS Take 325 mg by mouth daily with breakfast.    Yes [provider]  fluticasone (FLONASE) 50 MCG/ACT nasal spray SPRAY 2 SPRAYS INTO EACH NOSTRIL EVERY  DAY Patient taking differently: Place 2 sprays into both nostrils at bedtime as needed for allergies or rhinitis.  03/04/19  Yes Midge Minium, MD  isosorbide mononitrate (IMDUR) 30 MG 24 hr tablet Take 30 mg by mouth 2 (two) times daily.   Yes [provider]  levothyroxine (SYNTHROID) 150 MCG tablet Take 1 tablet (150 mcg total) by mouth daily before breakfast. 03/02/19  Yes Midge Minium, MD  magnesium oxide (MAG-OX) 400 MG tablet Take 400 mg by mouth at bedtime.    Yes [provider]  nitroGLYCERIN (NITROSTAT) 0.4 MG SL tablet Place 0.4 mg under the tongue every 5 (five) minutes as needed for chest pain.   Yes [provider]  quinapril (ACCUPRIL) 20 MG tablet Take 10 mg by mouth in the morning and at bedtime.  12/23/18  Yes [provider]  torsemide (DEMADEX) 20 MG tablet Take 1 tablet (20 mg total) by mouth daily. 06/01/19  Yes Shelly Coss, MD  vitamin C (ASCORBIC ACID) 500 MG tablet Take 500 mg by mouth daily.   Yes [provider]  isosorbide mononitrate (IMDUR) 60 MG 24 hr tablet Take 1 tablet (60 mg total) by mouth daily. 06/01/19   Shelly Coss, MD    Physical Exam: Vitals:   06/17/19 2015 06/17/19 2030 06/17/19 2045 06/17/19 2100  BP: (!) 157/42 (!) 152/49 (!) 166/56 (!) 169/51  Pulse: 85 (!) 56 (!) 56 (!) 56  Resp: 19 19 (!) 22 (!) 21  Temp:      TempSrc:      SpO2: 99% 99% 97% 98%  Weight:      Height:        Constitutional: NAD, calm, comfortable Eyes: PERRL, lids and conjunctivae normal ENMT: Mucous membranes are moist. Posterior pharynx clear of any exudate or lesions.Normal dentition.  Neck: normal, supple, no masses, no thyromegaly Respiratory: clear to auscultation bilaterally, no wheezing, no crackles. Normal respiratory effort. No accessory muscle use.  Cardiovascular: Regular rate and rhythm, no murmurs / rubs / gallops. No extremity edema. 2+ pedal pulses. No carotid bruits.  Abdomen: no tenderness, no  masses palpated. No hepatosplenomegaly. Bowel sounds positive.  Musculoskeletal: no clubbing / cyanosis. No joint deformity upper and lower extremities. Good ROM, no contractures. Normal muscle tone.  Skin: no rashes, lesions, ulcers. No induration Neurologic: CN 2-12 grossly intact. Sensation intact, DTR normal. Strength 5/5 in all 4.  Psychiatric: Normal judgment and insight. Alert and oriented x 3. Normal mood.    Labs on Admission: I have personally reviewed following labs and imaging studies  CBC: Recent Labs  Lab 06/17/19 1356 06/17/19 1932 06/17/19 1959 06/17/19 2132  WBC 6.7 11.2*  --   --   NEUTROABS 4.6 6.4  --   --  HGB 9.6* 10.1* 10.2* 9.9*  HCT 30.4* 33.3* 30.0* 29.0*  MCV 95.9 100.6*  --   --   PLT 237 326  --   --    Basic Metabolic Panel: Recent Labs  Lab 06/17/19 1932 06/17/19 1959 06/17/19 2132  NA 139 138 141  K 4.2 4.1 3.9  CL 101 102  --   CO2 19*  --   --   GLUCOSE 316* 297*  --   BUN 42* 38*  --   CREATININE 2.48* 2.50*  --   CALCIUM 8.6*  --   --   MG 2.4  --   --    GFR: Estimated Creatinine Clearance: 15.4 mL/min (A) (by C-G formula based on SCr of 2.5 mg/dL (H)). Liver Function Tests: Recent Labs  Lab 06/17/19 1932  AST 190*  ALT 59*  ALKPHOS 106  BILITOT 0.7  PROT 5.5*  ALBUMIN 3.0*   No results for input(s): LIPASE, AMYLASE in the last 168 hours. No results for input(s): AMMONIA in the last 168 hours. Coagulation Profile: No results for input(s): INR, PROTIME in the last 168 hours. Cardiac Enzymes: No results for input(s): CKTOTAL, CKMB, CKMBINDEX, TROPONINI in the last 168 hours. BNP (last 3 results) No results for input(s): PROBNP in the last 8760 hours. HbA1C: No results for input(s): HGBA1C in the last 72 hours. CBG: No results for input(s): GLUCAP in the last 168 hours. Lipid Profile: No results for input(s): CHOL, HDL, LDLCALC, TRIG, CHOLHDL, LDLDIRECT in the last 72 hours. Thyroid Function Tests: No results for  input(s): TSH, T4TOTAL, FREET4, T3FREE, THYROIDAB in the last 72 hours. Anemia Panel: Recent Labs    06/17/19 1356  VITAMINB12 1,136*  FOLATE 51.2  RETICCTPCT 2.5   Urine analysis: No results found for: COLORURINE, APPEARANCEUR, LABSPEC, PHURINE, GLUCOSEU, HGBUR, BILIRUBINUR, KETONESUR, PROTEINUR, UROBILINOGEN, NITRITE, LEUKOCYTESUR  Radiological Exams on Admission: DG Chest Portable 1 View  Result Date: 06/17/2019 CLINICAL DATA:  Dizziness for 1 hour EXAM: PORTABLE CHEST 1 VIEW COMPARISON:  05/29/2019 FINDINGS: Cardiac shadow is stable. Aortic calcifications are again seen. Increasing left-sided effusion and likely underlying infiltrate is present. The right lung is clear. No bony abnormality is noted. IMPRESSION: Increasing left-sided effusion and likely underlying infiltrate. Electronically Signed   By: Inez Catalina M.D.   On: 06/17/2019 20:20    EKG: Independently reviewed.  Assessment/Plan Principal Problem:   V-tach [facility]) Active Problems:   Type 2 diabetes mellitus with other circulatory complications (HCC)   HTN (hypertension)   CAD (coronary artery disease)   Paroxysmal atrial fibrillation (HCC)   Mitral regurgitation moderate to severe based on echocardiogram in summer 2020   CKD stage G4/A1, GFR 15-29 and albumin creatinine ratio <30 mg/g (HCC)   Sinus bradycardia   Transaminitis    1. V-tach - 1. Now remains NSR after sucessful 100j electrical cardioversion by EMS 2. BP and pt symptoms improved 3. Tele monitor in the SDU for tonight 4. Cards consult pending, but per Dr. Radford Pax: 1. No coreg 2. No amiodarone (including stopping home amio) 3. EP will see in AM 2. CHF - 1. Cont torsemide for now 2. Holding quinipril due to creat of 2.5 3. PAF - 1. Cont eliquis 2. Holding amiodarone 3. Was supposed to stop coreg after discharge last admit earlier this month due to intermittent sinus pauses / s.brady, but didn't. 4. CKD 4 - 1. Creat near baseline today, not sure  if hypotensive episode during V.Loralee Pacas will have any effect on kidney or  not: 2. Holding quinipril overnight 3. Cont torsemide 4. Repeat CMP in AM 5. HTN - 1. Holding quinipril for tonight 2. Stopping coreg 3. Continue IMDUR 6. DM2 - 1. Mod scale SSI AC/HS 7. Transaminitis - ? Shock liver from hypotension v.tach? 1. Repeat CMP in AM  DVT prophylaxis: Eliquis Code Status: Partial code - if we can get her back quickly may shock, use meds, no CPR, no intubation / life support. Family Communication: No family in room Disposition Plan: Home after cardiology clears Consults called: Dr. Radford Pax, cards consult note pending Admission status: Admit to inpatient  Severity of Illness: The appropriate patient status for this patient is INPATIENT. Inpatient status is judged to be reasonable and necessary in order to provide the required intensity of service to ensure the patient's safety. The patient's presenting symptoms, physical exam findings, and initial radiographic and laboratory data in the context of their chronic comorbidities is felt to place them at high risk for further clinical deterioration. Furthermore, it is not anticipated that the patient will be medically stable for discharge from the hospital within 2 midnights of admission. The following factors support the patient status of inpatient.   IP status for symptomatic V tach requiring electrical cardioversion.   * I certify that at the point of admission it is my clinical judgment that the patient will require inpatient hospital care spanning beyond 2 midnights from the point of admission due to high intensity of service, high risk for further deterioration and high frequency of surveillance required.*    Myrl Lazarus M. DO [facility]  How to contact the [facility] Attending or Consulting provider [place] or covering provider during after hours [place], for this patient?  1. Check the care team in [facility] and look for a)  attending/consulting [facility] provider listed and b) the [facility] team listed 2. Log into [email or web address]  Amion Physician Scheduling and messaging for groups and whole hospitals  On call and physician scheduling software for group practices, residents, hospitalists and other medical providers for call, clinic, rotation and shift schedules. OnCall Enterprise is a hospital-wide system for scheduling doctors and paging doctors on call. EasyPlot is for scientific plotting and data analysis.  [email or web address]  and use [place]'s universal password to access. If you do not have the password, please contact the hospital operator.  3. Locate the [facility] provider you are looking for under [facility] and page to a number that you can be directly reached. 4. If you still have difficulty reaching the provider, please page the [facility] (Director on Call) for the Hospitalists listed on amion for assistance.  06/17/2019, 10:03 PM

## 2019-06-17 NOTE — Consult Note (Addendum)
Admit date: 06/17/2019 Referring Physician  Jennette Kettle, MD Primary Physician  Annye Asa, MD Primary Cardiologist  Jenne Campus, MD/James Allred, MD Reason for Consultation  Ventricular tachycardia and syncope  HPI: Lauren Walker is a 86 y.o. female who is being seen today for the evaluation of ventricular tachycardia and syncope at the request of Jennette Kettle, MD.  This is an 86yo female with a past medical history significant for breast cancer, HFpEF, remote CAD (cath 2016 -RCA STEMI 12/2014, managed medically), paroxysmal atrial fibrillation, severe mitral regurgitation, mild AS, CKD, type 2 diabetes mellitus, hypertension, hyperlipidemia, and hypothyroidism.    She was recently admitted to [facility] with complaints of SOB and LE edema.  She was dx with acute CHF and started on IV diuretics.  hsTrop and BNp were elevated.  2D echo showed mildly reduced LVF with EF 45% with moderate to severe MR.  During that admission she had PAF with post-termination pauses up to 6 seconds and presyncope. She was seen by EP and recommended a conservative approach given her advanced age.  Plan was to maintain NSR with Amio 220m BID and was on Eliquis for CHADS2VASC socre of 7.  It was felt that PAF was due to severe MR.  It was decided not to pursue cath due to advanced age and CKD.  It was also decided to refer to structural heart after that hospital admit to see if she would be a candidate for MitraClip.  She was discharged hom on 4/13 on Torsemide 267mdaily.  Of note QTc was prolonged at 49974m.  Today she tells me that she went to get an infusion for her anemia and did fine with that.  After she got home she had an episode of severe tingling all over her body and felt transient dizziness.  Later on, while eating dinner, she felt the same symptoms again and passed out.  She does not know how long she was out.  EMS was called and initially HR was in the 200's with wide complex tachycardia felt to be  VT. She was given 150m47m Amio and cardioverted with 100J to NSR.  On arrival at [facility] HR was in the 40-50's.  Labs showed K+ 4.1 and Mag 2.4.  Creatinine 2.5 and Hbg 10.2. EKG showed junctional bradycardia at 43bpm with severe global ischemic ST changes and markedly prolonged QTc at 576ms37mnd anterior infarct and minimal ST elevated in aVR.  She denies any chest pain or palpitations.  During our interview her HR was in the 70's and decreased into the upper 40's with junctional bradycardia and then had nonsustained ventricular tachycardia.  Hs Trop elevated at 35 but had been 576 2 weeks ago.  Cxray showed increasing left sided effusion and infiltrate. [facility]     PMH:   Past Medical History:  Diagnosis Date  . Arthritis   . Chronic kidney disease   . Chronic systolic dysfunction of left ventricle   . Coronary artery disease   . Diabetes mellitus without complication (HCC) [place] Diverticulitis   . Hypertension   . Hypothyroidism   . Myocardial infarction (HCC) [place] Paroxysmal atrial fibrillation (HCC)   . Thyroid disease      PSH:   Past Surgical History:  Procedure Laterality Date  . APPENDECTOMY  1936  . IR THORACENTESIS ASP PLEURAL SPACE W/IMG GUIDE  05/27/2019  . PARS PLANA VITRECTOMY Right  12/04/2018   Procedure: RETINAL DETACHMENT REPAIR PPV 25 GAUGE WITH ENDO LASER AIR/FLUID EXCHANGE SF6 GAS INJECTION;  Surgeon: Jalene Mullet, MD;  Location: [place];  Service: Ophthalmology;  Laterality: Right;  . TONSILLECTOMY      Allergies:  Tape Prior to Admit Meds:  (Not in a hospital admission)  Fam HX:    Family History  Problem Relation Age of Onset  . Arthritis Mother   . Heart disease Mother   . Hypertension Mother   . Arthritis Father   . Heart attack Father   . Heart disease Father   . Arthritis Daughter   . Arthritis Son   . Depression Maternal Aunt   . Hyperlipidemia Maternal Aunt   . Hypertension Maternal  Aunt    Social HX:    Social History   Socioeconomic History  . Marital status: Married    Spouse name: Not on file  . Number of children: Not on file  . Years of education: Not on file  . Highest education level: Not on file  Occupational History  . Not on file  Tobacco Use  . Smoking status: Former Research scientist (life sciences)  . Smokeless tobacco: Never Used  Substance and Sexual Activity  . Alcohol use: Yes    Comment: Very rare  . Drug use: Not Currently  . Sexual activity: Not Currently  Other Topics Concern  . Not on file  Social History Narrative  . Not on file   Social Determinants of Health   Financial Resource Strain:   . Difficulty of Paying Living Expenses:   Food Insecurity:   . Worried About Charity fundraiser in the Last Year:   . Arboriculturist in the Last Year:   Transportation Needs:   . Film/video editor (Medical):   Marland Kitchen Lack of Transportation (Non-Medical):   Physical Activity:   . Days of Exercise per Week:   . Minutes of Exercise per Session:   Stress:   . Feeling of Stress :   Social Connections:   . Frequency of Communication with Friends and Family:   . Frequency of Social Gatherings with Friends and Family:   . Attends Religious Services:   . Active Member of Clubs or Organizations:   . Attends Archivist Meetings:   Marland Kitchen Marital Status:   Intimate Partner Violence:   . Fear of Current or Ex-Partner:   . Emotionally Abused:   Marland Kitchen Physically Abused:   . Sexually Abused:      ROS:  All  ROS were addressed and are negative except what is stated in the HPI  Physical Exam: Blood pressure (!) 169/51, pulse (!) 56, temperature 97.7 F (36.5 C), temperature source Oral, resp. rate (!) 21, height '5\' 4"'  (1.626 m), weight 75.3 kg, SpO2 98 %.    General: Well developed, well nourished, in no acute distress Head: Eyes PERRLA, No xanthomas.   Normal cephalic and atramatic  Lungs:   Clear bilaterally to auscultation and percussion. Heart:   HRRR S1 S2  Pulses are 2+ & equal.            No carotid bruit. No JVD.  No abdominal bruits. No femoral bruits. Abdomen: Bowel sounds are positive, abdomen soft and non-tender without masses or                  Hernia's noted. Msk:  Back normal, normal gait. Normal strength and tone for age. Extremities:   No clubbing, cyanosis or edema.  DP +1 Neuro: Alert and oriented X 3. Psych:  Good affect, responds appropriately    Labs:   Lab Results  Component Value Date   WBC 11.2 (H) 06/17/2019   HGB 9.9 (L) 06/17/2019   HCT 29.0 (L) 06/17/2019   MCV 100.6 (H) 06/17/2019   PLT 326 06/17/2019    Recent Labs  Lab 06/17/19 1932 06/17/19 1932 06/17/19 1959 06/17/19 1959 06/17/19 2132  NA 139   < > 138   < > 141  K 4.2   < > 4.1   < > 3.9  CL 101   < > 102  --   --   CO2 19*  --   --   --   --   BUN 42*   < > 38*  --   --   CREATININE 2.48*   < > 2.50*  --   --   CALCIUM 8.6*  --   --   --   --   PROT 5.5*  --   --   --   --   BILITOT 0.7  --   --   --   --   ALKPHOS 106  --   --   --   --   ALT 59*  --   --   --   --   AST 190*  --   --   --   --   GLUCOSE 316*   < > 297*  --   --    < > = values in this interval not displayed.   No results found for: PTT No results found for: INR, PROTIME No results found for: CKTOTAL, CKMB, CKMBINDEX, TROPONINI   Lab Results  Component Value Date   CHOL 109 12/29/2018   CHOL 107 08/16/2018   Lab Results  Component Value Date   HDL 48.40 12/29/2018   HDL 43.00 08/16/2018   Lab Results  Component Value Date   LDLCALC 30 12/29/2018   LDLCALC 40 08/16/2018   Lab Results  Component Value Date   TRIG 154.0 (H) 12/29/2018   TRIG 122.0 08/16/2018   Lab Results  Component Value Date   CHOLHDL 2 12/29/2018   CHOLHDL 2 08/16/2018   No results found for: LDLDIRECT    Radiology:  DG Chest Portable 1 View  Result Date: 06/17/2019 CLINICAL DATA:  Dizziness for 1 hour EXAM: PORTABLE CHEST 1 VIEW COMPARISON:  05/29/2019 FINDINGS: Cardiac shadow  is stable. Aortic calcifications are again seen. Increasing left-sided effusion and likely underlying infiltrate is present. The right lung is clear. No bony abnormality is noted. IMPRESSION: Increasing left-sided effusion and likely underlying infiltrate. Electronically Signed   By: Inez Catalina M.D.   On: 06/17/2019 20:20     Telemetry    NSR, sinus bradycardia, junctional bradycardia and NSVT - Personally Reviewed  ECG    ER - Junctional bradycardia at 43bpm with diffuse ST depression c/w global ischemia, QTc markedly prolonged at 542mec and minimal ST elevation in aVR. - Personally Reviewed  EMS EKG - monomorphic ventricular tachcyardia   ASSESSMENT/PLAN:    1. Presyncope/Syncope -this is likely related to bradyarrhythmias and ventricular tachycardia -QTc has markedly prolonged in setting of Amio load -K+ and Mag are normal  -no evidence of polymorphic VT that would be expected with QT prolongation induced arrhythmias -discussed with Dr. TLovena Lewith EP -stopping Amio -she is having episodes of HR slowing down into the low 40's with junctional bradycardia -she was felt not  to be candidate for PPM due to advanced age and comorbidities including breast CA that she has decided not to treat. Her breast CA is stage 1A invasive ductal CA  E+/P+/HER-2 neg and CKD -? Whether she would be a candidate for leadless Pacer  2.  Ventricular tachycardia -associated with presyncope/syncope -shocked to NSR with 100J and IV Amio -still having some episodes of NSVT on monitor -QTc very prolonged in setting of normal lytes and Mag -stop Amio -continue to monitor on Tele -discussed with Dr. Lovena Le -EP to see in am  3.  PAF -no evidence of PAF on tele -CHADS2VASC score of 7 -on Eliquis -initially placed on Amio to try to avoid post termination pauses -Amio now stopped due to prolonged QTc  4.  ASCAD -RCA STEMI 12/2014, managed medically in [place] -denies any chest pain -EKG shows diffuse  marked ischemic ST changes -felt not to be a cath candidate last admission due to comorbidities including CKD, breast CA, advanced age -not on ASA due to [place] -continue statin and IMdur -no BB due to tachybrady and current junctional bradycardia -hsTrop mildly elevated but much lower than recent trop at last hospitalization.   5.  Severe MR -followed by Dr. Drue Dun in the past -possible candidate for MitraClip -she has been referred to [name] for evaluation for [facility]  6.  Chronic combined systolic/diastolic CHF -she appears euvolemic on exam today -Cxray shows increased pleural effusion -EF 45% on last echo -hold torsemide for now  7.  Breast CA -Stage 1A E+/P+/HER -  -followed by Oncology -refused surgery -on  anastrozole   Fransico Him, MD  06/17/2019  10:02 PM

## 2019-06-17 NOTE — Patient Instructions (Signed)
Darbepoetin Alfa injection What is this medicine? DARBEPOETIN ALFA (dar be POE e tin AL fa) helps your body make more red blood cells. It is used to treat anemia caused by chronic kidney failure and chemotherapy. This medicine may be used for other purposes; ask your health care provider or pharmacist if you have questions. COMMON BRAND NAME(S): Aranesp What should I tell my health care provider before I take this medicine? They need to know if you have any of these conditions:  blood clotting disorders or history of blood clots  cancer patient not on chemotherapy  cystic fibrosis  heart disease, such as angina, heart failure, or a history of a heart attack  hemoglobin level of 12 g/dL or greater  high blood pressure  low levels of folate, iron, or vitamin B12  seizures  an unusual or allergic reaction to darbepoetin, erythropoietin, albumin, hamster proteins, latex, other medicines, foods, dyes, or preservatives  pregnant or trying to get pregnant  breast-feeding How should I use this medicine? This medicine is for injection into a vein or under the skin. It is usually given by a health care professional in a hospital or clinic setting. If you get this medicine at home, you will be taught how to prepare and give this medicine. Use exactly as directed. Take your medicine at regular intervals. Do not take your medicine more often than directed. It is important that you put your used needles and syringes in a special sharps container. Do not put them in a trash can. If you do not have a sharps container, call your pharmacist or healthcare provider to get one. A special MedGuide will be given to you by the pharmacist with each prescription and refill. Be sure to read this information carefully each time. Talk to your pediatrician regarding the use of this medicine in children. While this medicine may be used in children as young as 1 month of age for selected conditions, precautions do  apply. Overdosage: If you think you have taken too much of this medicine contact a poison control center or emergency room at once. NOTE: This medicine is only for you. Do not share this medicine with others. What if I miss a dose? If you miss a dose, take it as soon as you can. If it is almost time for your next dose, take only that dose. Do not take double or extra doses. What may interact with this medicine? Do not take this medicine with any of the following medications:  epoetin alfa This list may not describe all possible interactions. Give your health care provider a list of all the medicines, herbs, non-prescription drugs, or dietary supplements you use. Also tell them if you smoke, drink alcohol, or use illegal drugs. Some items may interact with your medicine. What should I watch for while using this medicine? Your condition will be monitored carefully while you are receiving this medicine. You may need blood work done while you are taking this medicine. This medicine may cause a decrease in vitamin B6. You should make sure that you get enough vitamin B6 while you are taking this medicine. Discuss the foods you eat and the vitamins you take with your health care professional. What side effects may I notice from receiving this medicine? Side effects that you should report to your doctor or health care professional as soon as possible:  allergic reactions like skin rash, itching or hives, swelling of the face, lips, or tongue  breathing problems  changes in   vision  chest pain  confusion, trouble speaking or understanding  feeling faint or lightheaded, falls  high blood pressure  muscle aches or pains  pain, swelling, warmth in the leg  rapid weight gain  severe headaches  sudden numbness or weakness of the face, arm or leg  trouble walking, dizziness, loss of balance or coordination  seizures (convulsions)  swelling of the ankles, feet, hands  unusually weak or  tired Side effects that usually do not require medical attention (report to your doctor or health care professional if they continue or are bothersome):  diarrhea  fever, chills (flu-like symptoms)  headaches  nausea, vomiting  redness, stinging, or swelling at site where injected This list may not describe all possible side effects. Call your doctor for medical advice about side effects. You may report side effects to FDA at 1-800-FDA-1088. Where should I keep my medicine? Keep out of the reach of children. Store in a refrigerator between 2 and 8 degrees C (36 and 46 degrees F). Do not freeze. Do not shake. Throw away any unused portion if using a single-dose vial. Throw away any unused medicine after the expiration date. NOTE: This sheet is a summary. It may not cover all possible information. If you have questions about this medicine, talk to your doctor, pharmacist, or health care provider.  2020 [employer] (2017-02-18 16:44:20)  

## 2019-06-17 NOTE — ED Notes (Signed)
Date and time results received: 04/30/212254   Test: troponin Critical Value: 311  Name of Provider Notified: Alcario Drought  Orders Received? Or Actions Taken?:  Pt was a critical troponin of 311. Provider Alcario Drought notified.

## 2019-06-17 NOTE — ED Triage Notes (Signed)
Pt complains of lightheadedness and dizziness that started 1hr ago while eating dinner. Ems called out Initial HR 200s. Ems given 150 of Amio and cardiovert at 100j. Pt now in sinus brady.

## 2019-06-18 DIAGNOSIS — I472 Ventricular tachycardia: Secondary | ICD-10-CM | POA: Diagnosis not present

## 2019-06-18 LAB — COMPREHENSIVE METABOLIC PANEL
ALT: 51 U/L — ABNORMAL HIGH (ref 0–44)
AST: 93 U/L — ABNORMAL HIGH (ref 15–41)
Albumin: 2.7 g/dL — ABNORMAL LOW (ref 3.5–5.0)
Alkaline Phosphatase: 89 U/L (ref 38–126)
Anion gap: 10 (ref 5–15)
BUN: 41 mg/dL — ABNORMAL HIGH (ref 8–23)
CO2: 26 mmol/L (ref 22–32)
Calcium: 8.8 mg/dL — ABNORMAL LOW (ref 8.9–10.3)
Chloride: 106 mmol/L (ref 98–111)
Creatinine, Ser: 2.42 mg/dL — ABNORMAL HIGH (ref 0.44–1.00)
GFR calc Af Amer: 20 mL/min — ABNORMAL LOW (ref >60)
GFR calc non Af Amer: 17 mL/min — ABNORMAL LOW (ref >60)
Glucose, Bld: 126 mg/dL — ABNORMAL HIGH (ref 70–99)
Potassium: 4.1 mmol/L (ref 3.5–5.1)
Sodium: 142 mmol/L (ref 135–145)
Total Bilirubin: 0.9 mg/dL (ref 0.3–1.2)
Total Protein: 5.2 g/dL — ABNORMAL LOW (ref 6.5–8.1)

## 2019-06-18 LAB — GLUCOSE, CAPILLARY
Glucose-Capillary: 102 mg/dL — ABNORMAL HIGH (ref 70–99)
Glucose-Capillary: 146 mg/dL — ABNORMAL HIGH (ref 70–99)

## 2019-06-18 LAB — URINALYSIS, ROUTINE W REFLEX MICROSCOPIC
Bilirubin Urine: NEGATIVE
Glucose, UA: 50 mg/dL — AB
Ketones, ur: NEGATIVE mg/dL
Nitrite: NEGATIVE
Protein, ur: 100 mg/dL — AB
Specific Gravity, Urine: 1.015 (ref 1.005–1.030)
pH: 5 (ref 5.0–8.0)

## 2019-06-18 LAB — CBC
HCT: 30.4 % — ABNORMAL LOW (ref 36.0–46.0)
Hemoglobin: 9.6 g/dL — ABNORMAL LOW (ref 12.0–15.0)
MCH: 31.2 pg (ref 26.0–34.0)
MCHC: 31.6 g/dL (ref 30.0–36.0)
MCV: 98.7 fL (ref 80.0–100.0)
Platelets: 206 10*3/uL (ref 150–400)
RBC: 3.08 MIL/uL — ABNORMAL LOW (ref 3.87–5.11)
RDW: 14.3 % (ref 11.5–15.5)
WBC: 5.8 10*3/uL (ref 4.0–10.5)
nRBC: 0 % (ref 0.0–0.2)

## 2019-06-18 LAB — CBG MONITORING, ED
Glucose-Capillary: 105 mg/dL — ABNORMAL HIGH (ref 70–99)
Glucose-Capillary: 137 mg/dL — ABNORMAL HIGH (ref 70–99)
Glucose-Capillary: 157 mg/dL — ABNORMAL HIGH (ref 70–99)

## 2019-06-18 MED ORDER — FLUTICASONE PROPIONATE 50 MCG/ACT NA SUSP
1.0000 | Freq: Two times a day (BID) | NASAL | Status: DC
  Administered 2019-06-18 – 2019-06-21 (×6): 1 via NASAL
  Filled 2019-06-18: qty 16

## 2019-06-18 MED ORDER — HYDRALAZINE HCL 25 MG PO TABS: 25.0000 mg | ORAL_TABLET | Freq: Three times a day (TID) | ORAL | Status: DC

## 2019-06-18 MED ORDER — HYDRALAZINE HCL 25 MG PO TABS
25.0000 mg | ORAL_TABLET | Freq: Three times a day (TID) | ORAL | Status: DC
  Administered 2019-06-18 – 2019-06-21 (×9): 25 mg via ORAL
  Filled 2019-06-18 (×9): qty 1

## 2019-06-18 MED ORDER — AZELASTINE HCL 0.1 % NA SOLN
1.0000 | Freq: Two times a day (BID) | NASAL | Status: DC
  Administered 2019-06-19: 1 via NASAL
  Filled 2019-06-18: qty 30

## 2019-06-18 MED ORDER — HYDRALAZINE HCL 10 MG PO TABS: 10.0000 mg | ORAL_TABLET | Freq: Once | ORAL | Status: DC

## 2019-06-18 MED ORDER — HYDRALAZINE HCL 20 MG/ML IJ SOLN
10.0000 mg | Freq: Once | INTRAMUSCULAR | Status: AC
  Administered 2019-06-18: 16:00:00 10 mg via INTRAVENOUS
  Filled 2019-06-18: qty 1

## 2019-06-18 NOTE — Progress Notes (Signed)
ELECTROPHYSIOLOGY CONSULT NOTE  Patient ID: Lauren Walker, MRN: [id number], DOB/AGE: April 18, 1929 86 y.o. Admit date: 06/17/2019 Date of Consult: 06/18/2019  Primary Physician: Midge Minium, MD Primary Cardiologist:  [name] is a 86 y.o. female who is being seen today for the evaluation of syncope and WCT at the request of TT.   Chief Complaint: syncope   HPI Lauren Walker is a 87 y.o. female admitted yesterday following a syncopal episode.  She had received an iron infusion.  Was feeling well.  Had a "brief episode "before supper.  She describes this as "buzzy "short-lived.  After supper it recurred.  Rather rapidly moved from her toe to her head.  She fell over on the sofa.  She did not respond to her husband and/or her daughter although she claims that she was aware of what was being said.  She was not aware of being short of breath.  There is also some chest tightness.  EMS was called.  She was found to be in a wide-complex tachycardia, described below, interpreted as I think appropriately as ventricular tachycardia.  She was shocked by EMS in route.  She felt immediately better.  Sinus rhythm ensued.   Hosp 4/21 admitted with worsening shortness of breath.  Found to have atrial fibrillation with rapid rates.  Received IV Lopressor. Noted to have syncope and posttermination pauses >>6 secs; EP (JA) amio with hopes to maintain sinus; has had no prior symptoms it was hoped that the atrial fibrillation was infrequent and unlikely to recur and pacing could be avoided.  She has a history of exertional dyspnea as well as chest tightness accompanied by dyspnea triggered by both exertion as well as by eating and relieved by rest.  Hx of Breast CA for which she is being treated with hormonal suppression therapy.  Has not had surgery.    Remote RCA Stemi     Patient evaluation of MR with structural heart clinic has been recommended  DATE TEST EF   8/20 Echo   65 % MR mod-sev   4/21 Echo  45% MR mod-severe  LAE severe (36-72ml/m2)   Date Cr K TSH AST Hgb  4/21 2.04 4.0 1.13 27    5/21 2.42 4.1 1.13 93 9.6      Thromboembolic risk factors ( age -61, HTN-1, DM-1, Vasc disease -1, CHF-1, Gender-1) for a CHADSVASc Score of >7      Past Medical History:  Diagnosis Date   Arthritis    Chronic kidney disease    Chronic systolic dysfunction of left ventricle    Coronary artery disease    Diabetes mellitus without complication (HCC)    Diverticulitis    Hypertension    Hypothyroidism    Myocardial infarction ([place])    Paroxysmal atrial fibrillation ([place])    Thyroid disease       Surgical History:  Past Surgical History:  Procedure Laterality Date   APPENDECTOMY  1936   IR THORACENTESIS ASP PLEURAL SPACE W/IMG GUIDE  05/27/2019   PARS PLANA VITRECTOMY Right 12/04/2018   Procedure: RETINAL DETACHMENT REPAIR PPV 25 GAUGE WITH ENDO LASER AIR/FLUID EXCHANGE SF6 GAS INJECTION;  Surgeon: Jalene Mullet, MD;  Location: [facility];  Service: Ophthalmology;  Laterality: Right;   TONSILLECTOMY       Home Meds: Prior to Admission medications   Medication Sig Start Date End Date Taking? Authorizing Provider  acetaminophen (TYLENOL) 650 MG CR tablet Take 650 mg by mouth every 8 (  eight) hours as needed for pain.   Yes [provider]  amiodarone (PACERONE) 200 MG tablet Take 1 tablet (200 mg total) by mouth 2 (two) times daily. 05/31/19  Yes Shelly Coss, MD  anastrozole (ARIMIDEX) 1 MG tablet TAKE 1 TABLET BY MOUTH EVERY DAY Patient taking differently: Take 1 mg by mouth at bedtime.  06/16/19  Yes Magrinat, Virgie Dad, MD  apixaban (ELIQUIS) 2.5 MG TABS tablet Take 1 tablet (2.5 mg total) by mouth 2 (two) times daily. 03/10/19  Yes Loel Dubonnet, NP  aspirin EC 81 MG tablet Take 81 mg by mouth daily.   Yes [provider]  atorvastatin (LIPITOR) 40 MG tablet Take 1 tablet (40 mg total) by mouth daily. Patient taking differently: Take  40 mg by mouth at bedtime.  03/02/19  Yes Midge Minium, MD  Azelastine-Fluticasone [phone] MCG/ACT SUSP Place 1 spray into both nostrils 2 (two) times daily.    Yes [provider]  b complex vitamins capsule Take 1 capsule by mouth daily.   Yes [provider]  Biotin 1 MG CAPS Take 1 mg by mouth daily.    Yes [provider]  Cholecalciferol (VITAMIN D3) 50 MCG (2000 UT) TABS Take 2,000 Units by mouth at bedtime.    Yes [provider]  Ferrous Sulfate (IRON) 325 (65 Fe) MG TABS Take 325 mg by mouth daily with breakfast.    Yes [provider]  fluticasone (FLONASE) 50 MCG/ACT nasal spray SPRAY 2 SPRAYS INTO EACH NOSTRIL EVERY DAY Patient taking differently: Place 2 sprays into both nostrils at bedtime as needed for allergies or rhinitis.  03/04/19  Yes Midge Minium, MD  isosorbide mononitrate (IMDUR) 30 MG 24 hr tablet Take 30 mg by mouth 2 (two) times daily.   Yes [provider]  levothyroxine (SYNTHROID) 150 MCG tablet Take 1 tablet (150 mcg total) by mouth daily before breakfast. 03/02/19  Yes Midge Minium, MD  magnesium oxide (MAG-OX) 400 MG tablet Take 400 mg by mouth at bedtime.    Yes [provider]  nitroGLYCERIN (NITROSTAT) 0.4 MG SL tablet Place 0.4 mg under the tongue every 5 (five) minutes as needed for chest pain.   Yes [provider]  quinapril (ACCUPRIL) 20 MG tablet Take 10 mg by mouth in the morning and at bedtime.  12/23/18  Yes [provider]  torsemide (DEMADEX) 20 MG tablet Take 1 tablet (20 mg total) by mouth daily. 06/01/19  Yes Shelly Coss, MD  vitamin C (ASCORBIC ACID) 500 MG tablet Take 500 mg by mouth daily.   Yes [provider]  isosorbide mononitrate (IMDUR) 60 MG 24 hr tablet Take 1 tablet (60 mg total) by mouth daily. 06/01/19   Shelly Coss, MD    Inpatient Medications:   anastrozole  1 mg Oral QHS   apixaban  2.5 mg Oral BID   aspirin EC  81  mg Oral Daily   atorvastatin  40 mg Oral QHS   azelastine  1 spray Each Nare BID   Or   fluticasone  1 spray Each Nare BID   cholecalciferol  2,000 Units Oral Daily   ferrous sulfate  325 mg Oral Q breakfast   insulin aspart  0-15 Units Subcutaneous TID WC   insulin aspart  0-5 Units Subcutaneous QHS   isosorbide mononitrate  30 mg Oral BID   levothyroxine  150 mcg Oral Q0600   magnesium oxide  400 mg Oral QHS  Allergies:  Allergies  Allergen Reactions   Tape Itching, Rash and Other (See Comments)    Reaction was from the surgical tape from cyst removed    Social History   Socioeconomic History   Marital status: Married    Spouse name: Not on file   Number of children: Not on file   Years of education: Not on file   Highest education level: Not on file  Occupational History   Not on file  Tobacco Use   Smoking status: Former Smoker   Smokeless tobacco: Never Used  Substance and Sexual Activity   Alcohol use: Yes    Comment: Very rare   Drug use: Not Currently   Sexual activity: Not Currently  Other Topics Concern   Not on file  Social History Narrative   Not on file   Social Determinants of Health   Financial Resource Strain:    Difficulty of Paying Living Expenses:   Food Insecurity:    Worried About Charity fundraiser in the Last Year:    Arboriculturist in the Last Year:   Transportation Needs:    Film/video editor (Medical):    Lack of Transportation (Non-Medical):   Physical Activity:    Days of Exercise per Week:    Minutes of Exercise per Session:   Stress:    Feeling of Stress :   Social Connections:    Frequency of Communication with Friends and Family:    Frequency of Social Gatherings with Friends and Family:    Attends Religious Services:    Active Member of Clubs or Organizations:    Attends Music therapist:    Marital Status:   Intimate Partner Violence:    Fear of Current or  Ex-Partner:    Emotionally Abused:    Physically Abused:    Sexually Abused:      Family History  Problem Relation Age of Onset   Arthritis Mother    Heart disease Mother    Hypertension Mother    Arthritis Father    Heart attack Father    Heart disease Father    Arthritis Daughter    Arthritis Son    Depression Maternal Aunt    Hyperlipidemia Maternal Aunt    Hypertension Maternal Aunt      ROS:  Please see the history of present illness.     All other systems reviewed and negative.    Physical Exam:  Blood pressure (!) 193/52, pulse (!) 52, temperature 97.7 F (36.5 C), temperature source Oral, resp. rate 17, height 5\' 4"  (1.626 m), weight 75.3 kg, SpO2 98 %. General: Well developed, well nourished female in no acute distress. Head: Normocephalic, atraumatic, sclera non-icteric, no xanthomas, nares are without discharge. EENT: normal Lymph Nodes:  none Back: without scoliosis/kyphosis , no CVA tendersness Neck: Negative for carotid bruits. JVD not elevated. Lungs: Clear bilaterally to auscultation without wheezes, rales, or rhonchi. Breathing is unlabored. Heart: Irregular RR with S1 S2. 3/6 systolic  murmur at the left upper sternal border, intensity varies with RR interval; 3/6 at the apex, intensity does not vary with RR interval., rubs, or gallops appreciated. Abdomen: Soft, non-tender, non-distended with normoactive bowel sounds. No hepatomegaly. No rebound/guarding. No obvious abdominal masses. Msk:  Strength and tone appear normal for age. Extremities: No clubbing or cyanosis.  1+ and 2+ on the left edema.  Distal pedal pulses are 2+ and equal bilaterally. Skin: Warm and Dry Neuro: Alert and oriented X 3. CN  III-XII intact Grossly normal sensory and motor function . Psych:  Responds to questions appropriately with a normal affect.      Labs: Cardiac Enzymes No results for input(s): CKTOTAL, CKMB, TROPONINI in the last 72 hours. CBC Lab Results    Component Value Date   WBC 5.8 06/18/2019   HGB 9.6 (L) 06/18/2019   HCT 30.4 (L) 06/18/2019   MCV 98.7 06/18/2019   PLT 206 06/18/2019   PROTIME: No results for input(s): LABPROT, INR in the last 72 hours. Chemistry  Recent Labs  Lab 06/18/19 0500  NA 142  K 4.1  CL 106  CO2 26  BUN 41*  CREATININE 2.42*  CALCIUM 8.8*  PROT 5.2*  BILITOT 0.9  ALKPHOS 89  ALT 51*  AST 93*  GLUCOSE 126*   Lipids Lab Results  Component Value Date   CHOL 109 12/29/2018   HDL 48.40 12/29/2018   LDLCALC 30 12/29/2018   TRIG 154.0 (H) 12/29/2018   BNP No results found for: PROBNP Thyroid Function Tests: No results for input(s): TSH, T4TOTAL, T3FREE, THYROIDAB in the last 72 hours.  Invalid input(s): FREET3    Miscellaneous No results found for: DDIMER  Radiology/Studies:  DG Chest 1 View  Result Date: 05/27/2019 CLINICAL DATA:  Status post left thoracentesis EXAM: CHEST  1 VIEW COMPARISON:  05/24/2019 FINDINGS: Interval thoracentesis has been performed on the left with near complete reduction of the left-sided pleural effusion. No pneumothorax is noted. No focal infiltrate is seen. Cardiac shadow is stable. Aortic calcifications are again noted. IMPRESSION: Resolution of left pleural effusion.  No pneumothorax is noted. Electronically Signed   By: Inez Catalina M.D.   On: 05/27/2019 13:48   DG Chest 2 View  Result Date: 05/24/2019 CLINICAL DATA:  Shortness of breath. EXAM: CHEST - 2 VIEW COMPARISON:  None. FINDINGS: Mild cardiomegaly is noted. Atherosclerosis of thoracic aorta is noted. No pneumothorax is noted. Right lung is clear. Moderate left pleural effusion is noted with probable underlying atelectasis or infiltrate. Bony thorax is unremarkable. IMPRESSION: Moderate left pleural effusion with probable underlying atelectasis or infiltrate. Aortic Atherosclerosis (ICD10-I70.0). Electronically Signed   By: Marijo Conception M.D.   On: 05/24/2019 20:55   DG Chest Portable 1  View  Result Date: 06/17/2019 CLINICAL DATA:  Dizziness for 1 hour EXAM: PORTABLE CHEST 1 VIEW COMPARISON:  05/29/2019 FINDINGS: Cardiac shadow is stable. Aortic calcifications are again seen. Increasing left-sided effusion and likely underlying infiltrate is present. The right lung is clear. No bony abnormality is noted. IMPRESSION: Increasing left-sided effusion and likely underlying infiltrate. Electronically Signed   By: Inez Catalina M.D.   On: 06/17/2019 20:20   DG Chest Port 1 View  Result Date: 05/29/2019 CLINICAL DATA:  Pleural effusion.  Shortness of breath. EXAM: PORTABLE CHEST 1 VIEW COMPARISON:  05/27/2019 FINDINGS: Lungs are adequately inflated without focal airspace consolidation or effusion. Cardiomediastinal silhouette, bones and soft tissues are unchanged. IMPRESSION: No acute disease. Electronically Signed   By: Marin Olp M.D.   On: 05/29/2019 15:27   ECHOCARDIOGRAM COMPLETE  Result Date: 05/25/2019    ECHOCARDIOGRAM REPORT   Patient Name:   Frutoso Schatz Date of Exam: 05/25/2019 Medical Rec #:  [id number]      Height:       64.0 in Accession #:    [id number]     Weight:       175.4 lb Date of Birth:  Jul 10, 1929      BSA:  1.850 m Patient Age:    33 years       BP:           165/58 mmHg Patient Gender: F              HR:           59 bpm. Exam Location:  Inpatient Procedure: 2D Echo Indications:    Congestive Heart Failure I50.31  History:        Patient has prior history of Echocardiogram examinations, most                 recent 02/16/2019.  Sonographer:    Mikki Santee RDCS (AE) Referring Phys: [id number] AMRIT ADHIKARI IMPRESSIONS  1. Left ventricular ejection fraction, by estimation, is 45 to 50%. The left ventricle has mildly decreased function. The left ventricle demonstrates regional wall motion abnormalities (see scoring diagram/findings for description). There is moderate left ventricular hypertrophy. Left ventricular diastolic parameters are indeterminate. There is  moderate hypokinesis of the left ventricular, entire inferior wall.  2. Right ventricular systolic function is normal. The right ventricular size is normal. There is normal pulmonary artery systolic pressure.  3. Left atrial size was severely dilated.  4. Large pleural effusion in the left lateral region.  5. The mitral valve is grossly normal. Moderate to severe mitral valve regurgitation.  6. The aortic valve is tricuspid. Aortic valve regurgitation is not visualized. Mild to moderate aortic valve stenosis. Aortic valve area, by VTI measures 1.49 cm. Aortic valve mean gradient measures 10.4 mmHg. Aortic valve Vmax measures 2.17 m/s.  7. The inferior vena cava is dilated in size with >50% respiratory variability, suggesting right atrial pressure of 8 mmHg. Comparison(s): 02/16/19: LVEF 60-65%, severe MR, severe LAE. FINDINGS  Left Ventricle: Left ventricular ejection fraction, by estimation, is 45 to 50%. The left ventricle has mildly decreased function. The left ventricle demonstrates regional wall motion abnormalities. Moderate hypokinesis of the left ventricular, entire inferior wall. The left ventricular internal cavity size was normal in size. There is moderate left ventricular hypertrophy. Left ventricular diastolic parameters are indeterminate. Right Ventricle: The right ventricular size is normal. No increase in right ventricular wall thickness. Right ventricular systolic function is normal. There is normal pulmonary artery systolic pressure. The tricuspid regurgitant velocity is 2.29 m/s, and  with an assumed right atrial pressure of 8 mmHg, the estimated right ventricular systolic pressure is 01.7 mmHg. Left Atrium: Left atrial size was severely dilated. Right Atrium: Right atrial size was normal in size. Pericardium: There is no evidence of pericardial effusion. Mitral Valve: The mitral valve is grossly normal. Moderate to severe mitral valve regurgitation, with posteriorly-directed jet. Tricuspid  Valve: The tricuspid valve is grossly normal. Tricuspid valve regurgitation is trivial. Aortic Valve: The aortic valve is tricuspid. Aortic valve regurgitation is not visualized. Mild to moderate aortic stenosis is present. Aortic valve mean gradient measures 10.4 mmHg. Aortic valve peak gradient measures 18.8 mmHg. Aortic valve area, by VTI measures 1.49 cm. Pulmonic Valve: The pulmonic valve was grossly normal. Pulmonic valve regurgitation is trivial. Aorta: The aortic root and ascending aorta are structurally normal, with no evidence of dilitation. Venous: The inferior vena cava is dilated in size with greater than 50% respiratory variability, suggesting right atrial pressure of 8 mmHg. IAS/Shunts: No atrial level shunt detected by color flow Doppler. Additional Comments: There is a large pleural effusion in the left lateral region.  LEFT VENTRICLE PLAX 2D LVIDd:  4.60 cm  Diastology LVIDs:         3.80 cm  LV e' lateral:   6.97 cm/s LV PW:         1.30 cm  LV E/e' lateral: 12.6 LV IVS:        1.30 cm  LV e' medial:    4.79 cm/s LVOT diam:     2.30 cm  LV E/e' medial:  18.3 LV SV:         85 LV SV Index:   46 LVOT Area:     4.15 cm  RIGHT VENTRICLE RV S prime:     14.50 cm/s TAPSE (M-mode): 1.8 cm LEFT ATRIUM              Index       RIGHT ATRIUM           Index LA diam:        4.00 cm  2.16 cm/m  RA Area:     17.60 cm LA Vol (A2C):   114.0 ml 61.61 ml/m RA Volume:   40.30 ml  21.78 ml/m LA Vol (A4C):   67.8 ml  36.64 ml/m LA Biplane Vol: 94.1 ml  50.86 ml/m  AORTIC VALVE AV Area (Vmax):    1.36 cm AV Area (Vmean):   1.33 cm AV Area (VTI):     1.49 cm AV Vmax:           216.80 cm/s AV Vmean:          151.600 cm/s AV VTI:            0.571 m AV Peak Grad:      18.8 mmHg AV Mean Grad:      10.4 mmHg LVOT Vmax:         70.80 cm/s LVOT Vmean:        48.400 cm/s LVOT VTI:          0.204 m LVOT/AV VTI ratio: 0.36  AORTA Ao Root diam: 3.20 cm MITRAL VALVE               TRICUSPID VALVE MV Area (PHT):  3.65 cm    TR Peak grad:   21.0 mmHg MV Decel Time: 208 msec    TR Vmax:        229.00 cm/s MR Peak grad: 103.2 mmHg MR Vmax:      508.00 cm/s  SHUNTS MV E velocity: 87.50 cm/s  Systemic VTI:  0.20 m MV A velocity: 33.40 cm/s  Systemic Diam: 2.30 cm MV E/A ratio:  2.62 Lyman Bishop MD Electronically signed by Lyman Bishop MD Signature Date/Time: 05/25/2019/4:51:06 PM    Final    IR THORACENTESIS ASP PLEURAL SPACE W/IMG GUIDE  Result Date: 05/27/2019 INDICATION: Patient with history of congestive heart failure, now with left pleural effusion. Request is made for therapeutic thoracentesis. EXAM: ULTRASOUND GUIDED THERAPEUTIC THORACENTESIS MEDICATIONS: 10 mL 1% lidocaine COMPLICATIONS: None immediate. PROCEDURE: An ultrasound guided thoracentesis was thoroughly discussed with the patient and questions answered. The benefits, risks, alternatives and complications were also discussed. The patient understands and wishes to proceed with the procedure. Written consent was obtained. Ultrasound was performed to localize and mark an adequate pocket of fluid in the left chest. The area was then prepped and draped in the normal sterile fashion. 1% Lidocaine was used for local anesthesia. Under ultrasound guidance a 6 Fr Safe-T-Centesis catheter was introduced. Thoracentesis was performed. The catheter was removed and a dressing applied. FINDINGS: A total of  approximately 550 mL of yellow fluid was removed. IMPRESSION: Successful ultrasound guided therapeutic thoracentesis yielding 550 mL of pleural fluid. Read by: Brynda Greathouse PA-C Electronically Signed   By: Jerilynn Mages.  Shick M.D.   On: 05/27/2019 13:58    EKG: Junctional rhythm at 43 with occasional P waves. Intervals-/11/57  ECG of wide-complex tachycardia cycle length 280 ms with a left bundle branch block pattern northwest axis QRS is negative in aVR   Assessment and Plan:  Ventricular tachycardia-monomorphic  QT prolongation  Atrial fibrillation  Presyncope  with posttermination pauses  Amiodarone therapy for the above  Elevated hyper transaminases  Anemia  Renal insufficiency grade 4  Coronary artery disease with prior RCA STEMI/stenting  Exertional chest discomfort/dyspnea   The patient has monomorphic ventricular tachycardia.  The temporal relationship to the amiodarone not withstanding the infrequency of amiodarone being proarrhythmic begs the possibility that amiodarone is causally related to the monomorphic VT.  ECG following cardioversion shows significant QT prolongation; we will repeat this.  Now, however, we will discontinue the amiodarone.  Atrial fibrillation is likely going to be a recurring issue.  She has significant left atrial enlargement mitral valve disease.  We have discussed the role of backup bradycardia pacing; I think it is going to be necessary for nonarrhythmic control of her atrial fibrillation rates.    Her chest discomfort or prior infarct also raises the possibility that the ventricular tachycardia was substrate related.  Morphology of the ventricular tachycardia however does not seem to be related to an inferior wall MI.  We will undertake Myoview scanning to look for ischemic burden as well as to identify scar to help understand the mechanism of her ventricular tachycardia and a threshold for intervening not withstanding her renal insufficiency.  Dyspnea has been much improved following the use of torsemide.  Elevated transaminases may be related to amiodarone toxicity or the prolonged ventricular tachycardia.  We will reassess them in the morning.        Virl Axe

## 2019-06-18 NOTE — ED Notes (Signed)
Lunch Tray Ordered [id number]. 

## 2019-06-18 NOTE — ED Notes (Signed)
Labs have already been drawn

## 2019-06-18 NOTE — ED Notes (Signed)
Took patient to the bathroom patient did well patient is now back in the room on the monitor with call bell in reach

## 2019-06-18 NOTE — ED Notes (Signed)
Checked patient cbg it was 44 notified RN of blood sugar patient is resting with call bell in reach and family at bedside

## 2019-06-18 NOTE — ED Notes (Signed)
Provider paged regarding BP waiting on provider to call back

## 2019-06-18 NOTE — Progress Notes (Signed)
PROGRESS NOTE    Lauren Walker  QIO:[id number] DOB: [birth year] DOA: 06/17/2019 PCP: Midge Minium, MD    Brief Narrative:  Patient is 86 year old female with history of breast cancer, heart failure with preserved ejection fraction, remote coronary artery disease, paroxysmal A. fib, severe mitral regurgitation, mild aortic stenosis, CKD stage IV, type 2 diabetes, hypertension hyperlipidemia and hypothyroidism who was recently admitted to the hospital with diastolic dysfunction and severe MR presents back to the emergency room with with syncopal episode, EMS found with SVT with heart rate 200 not relieved by amiodarone bolus, needed emergent DC cardioversion and resulting bradycardia. On recent hospitalization, she was treated with diuresis, found to have severe mitral regurgitation, treated with amiodarone.  She was also found to have prolonged pauses up to 6 seconds.  Cardiac cath not pursued because of advanced kidney disease.  Pacemaker not pursued because of advanced age.  On arrival to emergency room, remained in normal sinus rhythm and hemodynamically stable.  Mental status improved.  Seen and followed by cardiology.   Assessment & Plan:   Principal Problem:   V-tach [facility]) Active Problems:   Type 2 diabetes mellitus with other circulatory complications (HCC)   HTN (hypertension)   CAD (coronary artery disease)   Paroxysmal atrial fibrillation (HCC)   Mitral regurgitation moderate to severe based on echocardiogram in summer 2020   CKD stage G4/A1, GFR 15-29 and albumin creatinine ratio <30 mg/g (HCC)   Sinus bradycardia   Transaminitis  Presyncopal episode with ventricular tachycardia: Electrolytes are adequate. Amiodarone push followed by 100 J DC cardioversion. Currently remains on sinus rhythm. Discontinued amiodarone and all rate control medications. Continue monitoring in telemetry.  Will be seen by EP cardiology.  Paroxysmal atrial fibrillation: Currently in sinus  rhythm.  CHA2DS2-VASc score of 7.  On Eliquis.  Amiodarone discontinued because of prolonged QTC.  Chronic diastolic heart failure: Euvolemic.  On maintenance torsemide that she will continue.  Holding quinapril.  Chronic kidney disease stage IV: Creatinine and renal functions at about her baseline.  We will close monitor.  Hypertension: Blood pressure stable.  Discontinued nightly Toprol and Coreg.  Continue nitrates.  Type 2 diabetes: On sliding scale.  She is not on treatment at home.  Hemoglobin A1c 5.3.  Diet controlled.  No indication for treatment.  Hypothyroidism: Recent TSH 1.7.  Remains on thyroxine replacement.   DVT prophylaxis: Eliquis Code Status: Partial Family Communication: None present at the bedside. Disposition Plan: Status is: Inpatient  Remains inpatient appropriate because:Inpatient level of care appropriate due to severity of illness   Dispo: The patient is from: Home              Anticipated d/c is to: Home              Anticipated d/c date is: 2 days              Patient currently is not medically stable to d/c.          Consultants:   Cardiology  Procedures:   None  Antimicrobials:   None   Subjective: Patient seen and examined.  She was in emergency room waiting for inpatient bed assignment.  No overnight events.  She has no chest pain or palpitations or dizziness.  Ambulated with nursing staff helping. Telemetry shows normal sinus rhythm since arrival.  Occasional heart rate less than 50.  Objective: Vitals:   06/18/19 0315 06/18/19 0625 06/18/19 0700 06/18/19 1000  BP: (!) 162/49 (!) 171/52 132/77 Marland Kitchen)  152/59  Pulse: (!) 45 (!) 59 (!) 55 (!) 50  Resp: 18 19 20 17   Temp:      TempSrc:      SpO2: 97% 97% 95% 97%  Weight:      Height:       No intake or output data in the 24 hours ending 06/18/19 1137 Filed Weights   06/17/19 1917  Weight: 75.3 kg    Examination:  General exam: Appears calm and comfortable  Respiratory  system: Clear to auscultation. Respiratory effort normal. Cardiovascular system: S1 & S2 heard, RRR. No JVD, murmurs, rubs, gallops or clicks. No pedal edema. Gastrointestinal system: Abdomen is nondistended, soft and nontender. No organomegaly or masses felt. Normal bowel sounds heard. Central nervous system: Alert and oriented. No focal neurological deficits. Extremities: Symmetric 5 x 5 power. Skin: No rashes, lesions or ulcers Psychiatry: Judgement and insight appear normal. Mood & affect appropriate.     Data Reviewed: I have personally reviewed following labs and imaging studies  CBC: Recent Labs  Lab 06/17/19 1356 06/17/19 1932 06/17/19 1959 06/17/19 2132 06/18/19 0500  WBC 6.7 11.2*  --   --  5.8  NEUTROABS 4.6 6.4  --   --   --   HGB 9.6* 10.1* 10.2* 9.9* 9.6*  HCT 30.4* 33.3* 30.0* 29.0* 30.4*  MCV 95.9 100.6*  --   --  98.7  PLT 237 326  --   --  389   Basic Metabolic Panel: Recent Labs  Lab 06/17/19 1932 06/17/19 1959 06/17/19 2132 06/18/19 0500  NA 139 138 141 142  K 4.2 4.1 3.9 4.1  CL 101 102  --  106  CO2 19*  --   --  26  GLUCOSE 316* 297*  --  126*  BUN 42* 38*  --  41*  CREATININE 2.48* 2.50*  --  2.42*  CALCIUM 8.6*  --   --  8.8*  MG 2.4  --   --   --    GFR: Estimated Creatinine Clearance: 16 mL/min (A) (by C-G formula based on SCr of 2.42 mg/dL (H)). Liver Function Tests: Recent Labs  Lab 06/17/19 1932 06/18/19 0500  AST 190* 93*  ALT 59* 51*  ALKPHOS 106 89  BILITOT 0.7 0.9  PROT 5.5* 5.2*  ALBUMIN 3.0* 2.7*   No results for input(s): LIPASE, AMYLASE in the last 168 hours. No results for input(s): AMMONIA in the last 168 hours. Coagulation Profile: No results for input(s): INR, PROTIME in the last 168 hours. Cardiac Enzymes: No results for input(s): CKTOTAL, CKMB, CKMBINDEX, TROPONINI in the last 168 hours. BNP (last 3 results) No results for input(s): PROBNP in the last 8760 hours. HbA1C: No results for input(s): HGBA1C in  the last 72 hours. CBG: Recent Labs  Lab 06/18/19 0104 06/18/19 0928  GLUCAP 105* 137*   Lipid Profile: No results for input(s): CHOL, HDL, LDLCALC, TRIG, CHOLHDL, LDLDIRECT in the last 72 hours. Thyroid Function Tests: No results for input(s): TSH, T4TOTAL, FREET4, T3FREE, THYROIDAB in the last 72 hours. Anemia Panel: Recent Labs    06/17/19 1356  VITAMINB12 1,136*  FOLATE 51.2  RETICCTPCT 2.5   Sepsis Labs: No results for input(s): PROCALCITON, LATICACIDVEN in the last 168 hours.  Recent Results (from the past 240 hour(s))  Respiratory Panel by RT PCR (Flu A&B, Covid) - Nasopharyngeal Swab     Status: None   Collection Time: 06/17/19  7:32 PM   Specimen: Nasopharyngeal Swab  Result Value Ref Range Status  SARS Coronavirus 2 by RT PCR NEGATIVE NEGATIVE Final    Comment: (NOTE) SARS-CoV-2 target nucleic acids are NOT DETECTED. The SARS-CoV-2 RNA is generally detectable in upper respiratoy specimens during the acute phase of infection. The lowest concentration of SARS-CoV-2 viral copies this assay can detect is 131 copies/mL. A negative result does not preclude SARS-Cov-2 infection and should not be used as the sole basis for treatment or other patient management decisions. A negative result may occur with  improper specimen collection/handling, submission of specimen other than nasopharyngeal swab, presence of viral mutation(s) within the areas targeted by this assay, and inadequate number of viral copies (<131 copies/mL). A negative result must be combined with clinical observations, patient history, and epidemiological information. The expected result is Negative. Fact Sheet for Patients:  PinkCheek.be Fact Sheet for Healthcare Providers:  GravelBags.it This test is not yet ap proved or cleared by the [place] FDA and  has been authorized for detection and/or diagnosis of SARS-CoV-2 by FDA under an  Emergency Use Authorization (EUA). This EUA will remain  in effect (meaning this test can be used) for the duration of the COVID-19 declaration under Section 564(b)(1) of the Act, 21 U.S.C. section 360bbb-3(b)(1), unless the authorization is terminated or revoked sooner.    Influenza A by PCR NEGATIVE NEGATIVE Final   Influenza B by PCR NEGATIVE NEGATIVE Final    Comment: (NOTE) The Xpert Xpress SARS-CoV-2/FLU/RSV assay is intended as an aid in  the diagnosis of influenza from Nasopharyngeal swab specimens and  should not be used as a sole basis for treatment. Nasal washings and  aspirates are unacceptable for Xpert Xpress SARS-CoV-2/FLU/RSV  testing. Fact Sheet for Patients: PinkCheek.be Fact Sheet for Healthcare Providers: GravelBags.it This test is not yet approved or cleared by the [place] FDA and  has been authorized for detection and/or diagnosis of SARS-CoV-2 by  FDA under an Emergency Use Authorization (EUA). This EUA will remain  in effect (meaning this test can be used) for the duration of the  Covid-19 declaration under Section 564(b)(1) of the Act, 21  U.S.C. section 360bbb-3(b)(1), unless the authorization is  terminated or revoked. Performed at [facility], [place] [address], [place], [place] [address]          Radiology Studies: DG Chest Portable 1 View  Result Date: 06/17/2019 CLINICAL DATA:  Dizziness for 1 hour EXAM: PORTABLE CHEST 1 VIEW COMPARISON:  05/29/2019 FINDINGS: Cardiac shadow is stable. Aortic calcifications are again seen. Increasing left-sided effusion and likely underlying infiltrate is present. The right lung is clear. No bony abnormality is noted. IMPRESSION: Increasing left-sided effusion and likely underlying infiltrate. Electronically Signed   By: Inez Catalina M.D.   On: 06/17/2019 20:20        Scheduled Meds: . anastrozole  1 mg Oral QHS  . apixaban  2.5 mg Oral BID   . aspirin EC  81 mg Oral Daily  . atorvastatin  40 mg Oral QHS  . azelastine  1 spray Each Nare BID   Or  . fluticasone  1 spray Each Nare BID  . cholecalciferol  2,000 Units Oral Daily  . ferrous sulfate  325 mg Oral Q breakfast  . insulin aspart  0-15 Units Subcutaneous TID WC  . insulin aspart  0-5 Units Subcutaneous QHS  . isosorbide mononitrate  30 mg Oral BID  . levothyroxine  150 mcg Oral Q0600  . magnesium oxide  400 mg Oral QHS   Continuous Infusions:   LOS:  1 day    Time spent: 30 minutes    Barb Merino, MD [facility] Pager [phone]

## 2019-06-19 ENCOUNTER — Inpatient Hospital Stay (HOSPITAL_COMMUNITY): Payer: Medicare Other

## 2019-06-19 DIAGNOSIS — I2510 Atherosclerotic heart disease of native coronary artery without angina pectoris: Secondary | ICD-10-CM

## 2019-06-19 DIAGNOSIS — I472 Ventricular tachycardia: Secondary | ICD-10-CM | POA: Diagnosis not present

## 2019-06-19 DIAGNOSIS — R55 Syncope and collapse: Secondary | ICD-10-CM | POA: Diagnosis not present

## 2019-06-19 LAB — COMPREHENSIVE METABOLIC PANEL
ALT: 39 U/L (ref 0–44)
AST: 49 U/L — ABNORMAL HIGH (ref 15–41)
Albumin: 2.5 g/dL — ABNORMAL LOW (ref 3.5–5.0)
Alkaline Phosphatase: 64 U/L (ref 38–126)
Anion gap: 7 (ref 5–15)
BUN: 40 mg/dL — ABNORMAL HIGH (ref 8–23)
CO2: 26 mmol/L (ref 22–32)
Calcium: 8.6 mg/dL — ABNORMAL LOW (ref 8.9–10.3)
Chloride: 108 mmol/L (ref 98–111)
Creatinine, Ser: 2.3 mg/dL — ABNORMAL HIGH (ref 0.44–1.00)
GFR calc Af Amer: 21 mL/min — ABNORMAL LOW (ref >60)
GFR calc non Af Amer: 18 mL/min — ABNORMAL LOW (ref >60)
Glucose, Bld: 108 mg/dL — ABNORMAL HIGH (ref 70–99)
Potassium: 4.4 mmol/L (ref 3.5–5.1)
Sodium: 141 mmol/L (ref 135–145)
Total Bilirubin: 0.6 mg/dL (ref 0.3–1.2)
Total Protein: 4.6 g/dL — ABNORMAL LOW (ref 6.5–8.1)

## 2019-06-19 LAB — GLUCOSE, CAPILLARY
Glucose-Capillary: 111 mg/dL — ABNORMAL HIGH (ref 70–99)
Glucose-Capillary: 147 mg/dL — ABNORMAL HIGH (ref 70–99)
Glucose-Capillary: 232 mg/dL — ABNORMAL HIGH (ref 70–99)
Glucose-Capillary: 95 mg/dL (ref 70–99)

## 2019-06-19 LAB — NM MYOCAR MULTI W/SPECT W/WALL MOTION / EF
Estimated workload: 1 METS
Exercise duration (min): 0 min
Exercise duration (sec): 0 s
MPHR: 132 {beats}/min
Peak HR: 85 {beats}/min
Percent HR: 64 %
Rest HR: 73 {beats}/min

## 2019-06-19 LAB — SURGICAL PCR SCREEN
MRSA, PCR: NEGATIVE
Staphylococcus aureus: NEGATIVE

## 2019-06-19 MED ORDER — TECHNETIUM TC 99M TETROFOSMIN IV KIT
31.0000 | PACK | Freq: Once | INTRAVENOUS | Status: AC | PRN
  Administered 2019-06-19: 31 via INTRAVENOUS

## 2019-06-19 MED ORDER — SODIUM CHLORIDE 0.9 % IV SOLN: INTRAVENOUS | Status: DC

## 2019-06-19 MED ORDER — ISOSORBIDE DINITRATE 10 MG PO TABS
40.0000 mg | ORAL_TABLET | Freq: Two times a day (BID) | ORAL | Status: DC
  Administered 2019-06-19 – 2019-06-21 (×5): 40 mg via ORAL
  Filled 2019-06-19 (×6): qty 4

## 2019-06-19 MED ORDER — REGADENOSON 0.4 MG/5ML IV SOLN
0.4000 mg | Freq: Once | INTRAVENOUS | Status: AC
  Filled 2019-06-19: qty 5

## 2019-06-19 MED ORDER — TECHNETIUM TC 99M TETROFOSMIN IV KIT
10.8000 | PACK | Freq: Once | INTRAVENOUS | Status: AC | PRN
  Administered 2019-06-19: 10.8 via INTRAVENOUS

## 2019-06-19 MED ORDER — REGADENOSON 0.4 MG/5ML IV SOLN
INTRAVENOUS | Status: AC
  Administered 2019-06-19: 10:00:00 0.4 mg via INTRAVENOUS
  Filled 2019-06-19: qty 5

## 2019-06-19 MED ORDER — CEFAZOLIN SODIUM-DEXTROSE 2-4 GM/100ML-% IV SOLN
2.0000 g | INTRAVENOUS | Status: AC
  Administered 2019-06-20: 2 g via INTRAVENOUS

## 2019-06-19 MED ORDER — SODIUM CHLORIDE 0.9 % IV SOLN: 80.0000 mg | INTRAVENOUS | Status: DC

## 2019-06-19 NOTE — Progress Notes (Addendum)
Progress Note  Patient Name: Lauren Walker Date of Encounter: 06/19/2019  Primary Cardiologist: Jenne Campus, MD   Subjective   No chest discomfort overnight. BP has remained elevated but she is asymptomatic with this. Has been NPO since midnight. Evaluated in Nuc Med at the time of stress testing.   The patient denies chest pain, shortness of breath, nocturnal dyspnea, orthopnea or peripheral edema.  There have been no palpitations, lightheadedness or syncope.    Inpatient Medications    Scheduled Meds: . anastrozole  1 mg Oral QHS  . apixaban  2.5 mg Oral BID  . aspirin EC  81 mg Oral Daily  . atorvastatin  40 mg Oral QHS  . azelastine  1 spray Each Nare BID   Or  . fluticasone  1 spray Each Nare BID  . cholecalciferol  2,000 Units Oral Daily  . ferrous sulfate  325 mg Oral Q breakfast  . hydrALAZINE  25 mg Oral Q8H  . insulin aspart  0-15 Units Subcutaneous TID WC  . insulin aspart  0-5 Units Subcutaneous QHS  . isosorbide mononitrate  30 mg Oral BID  . levothyroxine  150 mcg Oral Q0600  . magnesium oxide  400 mg Oral QHS   Continuous Infusions:  PRN Meds: acetaminophen **OR** acetaminophen, fluticasone, ondansetron **OR** ondansetron (ZOFRAN) IV   Vital Signs    Vitals:   06/19/19 1002 06/19/19 1009 06/19/19 1011 06/19/19 1013  BP: (!) 221/91 (!) 208/80 (!) 188/75 (!) 185/74  Pulse:      Resp: (!) 24 (!) 25 20 19   Temp:      TempSrc:      SpO2:      Weight:      Height:        Intake/Output Summary (Last 24 hours) at 06/19/2019 1111 Last data filed at 06/19/2019 0800 Gross per 24 hour  Intake 120 ml  Output --  Net 120 ml    Last 3 Weights 06/18/2019 06/17/2019 06/07/2019  Weight (lbs) 168 lb 4.8 oz 166 lb 166 lb 8 oz  Weight (kg) 76.34 kg 75.297 kg 75.524 kg      Telemetry    Telemetry personally reviewed without VT   ECG    No new tracings.   Physical Exam   General: Well developed, well nourished, female appearing in no acute  distress. Head: Normocephalic, atraumatic.  Neck: JVD not elevated. Lungs:  Resp regular and unlabored, CTA without wheezing or rales. Heart: RRR, S1, S2, no S3, S4, 2/6 holosystolic murmur along Apex.  Abdomen: Soft, non-tender, non-distended with normoactive bowel sounds. No hepatomegaly. No rebound/guarding. No obvious abdominal masses. Extremities: No clubbing, cyanosis, some edema R>L chronic Distal pedal pulses are 2+ bilaterally. Neuro: Alert and oriented X 3. Moves all extremities spontaneously. Psych: Normal affect.  Labs    Chemistry Recent Labs  Lab 06/17/19 1932 06/17/19 1932 06/17/19 1959 06/17/19 1959 06/17/19 2132 06/18/19 0500 06/19/19 0339  NA 139   < > 138   < > 141 142 141  K 4.2   < > 4.1   < > 3.9 4.1 4.4  CL 101   < > 102  --   --  106 108  CO2 19*  --   --   --   --  26 26  GLUCOSE 316*   < > 297*  --   --  126* 108*  BUN 42*   < > 38*  --   --  41* 40*  CREATININE  2.48*   < > 2.50*  --   --  2.42* 2.30*  CALCIUM 8.6*  --   --   --   --  8.8* 8.6*  PROT 5.5*  --   --   --   --  5.2* 4.6*  ALBUMIN 3.0*  --   --   --   --  2.7* 2.5*  AST 190*  --   --   --   --  93* 49*  ALT 59*  --   --   --   --  51* 39  ALKPHOS 106  --   --   --   --  89 64  BILITOT 0.7  --   --   --   --  0.9 0.6  GFRNONAA 17*  --   --   --   --  17* 18*  GFRAA 19*  --   --   --   --  20* 21*  ANIONGAP 19*  --   --   --   --  10 7   < > = values in this interval not displayed.     Hematology Recent Labs  Lab 06/17/19 1356 06/17/19 1356 06/17/19 1932 06/17/19 1932 06/17/19 1959 06/17/19 2132 06/18/19 0500  WBC 6.7  --  11.2*  --   --   --  5.8  RBC 3.17*  3.20*  --  3.31*  --   --   --  3.08*  HGB 9.6*   < > 10.1*   < > 10.2* 9.9* 9.6*  HCT 30.4*   < > 33.3*   < > 30.0* 29.0* 30.4*  MCV 95.9  --  100.6*  --   --   --  98.7  MCH 30.3  --  30.5  --   --   --  31.2  MCHC 31.6  --  30.3  --   --   --  31.6  RDW 14.1  --  14.1  --   --   --  14.3  PLT 237  --  326  --    --   --  206   < > = values in this interval not displayed.    Cardiac EnzymesNo results for input(s): TROPONINI in the last 168 hours. No results for input(s): TROPIPOC in the last 168 hours.   BNPNo results for input(s): BNP, PROBNP in the last 168 hours.   DDimer No results for input(s): DDIMER in the last 168 hours.   Radiology    DG Chest Portable 1 View  Result Date: 06/17/2019 CLINICAL DATA:  Dizziness for 1 hour EXAM: PORTABLE CHEST 1 VIEW COMPARISON:  05/29/2019 FINDINGS: Cardiac shadow is stable. Aortic calcifications are again seen. Increasing left-sided effusion and likely underlying infiltrate is present. The right lung is clear. No bony abnormality is noted. IMPRESSION: Increasing left-sided effusion and likely underlying infiltrate. Electronically Signed   By: Inez Catalina M.D.   On: 06/17/2019 20:20    Cardiac Studies   Echocardiogram: 05/2019 IMPRESSIONS    1. Left ventricular ejection fraction, by estimation, is 45 to 50%. The  left ventricle has mildly decreased function. The left ventricle  demonstrates regional wall motion abnormalities (see scoring  diagram/findings for description). There is moderate  left ventricular hypertrophy. Left ventricular diastolic parameters are  indeterminate. There is moderate hypokinesis of the left ventricular,  entire inferior wall.  2. Right ventricular systolic function is normal. The right ventricular  size is normal.  There is normal pulmonary artery systolic pressure.  3. Left atrial size was severely dilated.  4. Large pleural effusion in the left lateral region.  5. The mitral valve is grossly normal. Moderate to severe mitral valve  regurgitation.  6. The aortic valve is tricuspid. Aortic valve regurgitation is not  visualized. Mild to moderate aortic valve stenosis. Aortic valve area, by  VTI measures 1.49 cm. Aortic valve mean gradient measures 10.4 mmHg.  Aortic valve Vmax measures 2.17 m/s.  7. The  inferior vena cava is dilated in size with >50% respiratory  variability, suggesting right atrial pressure of 8 mmHg.   Comparison(s): 02/16/19: LVEF 60-65%, severe MR, severe LAE.   Patient Profile     86 y.o. female w/ PMH of CAD (cath 2016 elsewhere, no report available, was told 1 occluded artery with collateral managed medically),paroxysmal atrial fibrillation, chronic diastolic CHF,severe mitral regurgitation,mild AS,CKD stage IV, anemia, Type 2 DM, HTN, HLD hypothyroidism, breast CA. Presented with dizziness and presyncope. Had been in VT upon EMS arrival and was cardioverted at 100 J.   Assessment & Plan    1. Presyncope/VT - evaluated by EP yesterday and Amiodarone has been discontinued given her significant QT prolongation. Lexiscan Myoview ordered to assess for ischemia and evaluate scarring. Lexiscan completed with official report pending following stress images.   2. Paroxysmal Atrial Fibrillation - was on Amiodarone prior to admission but this has now been discontinued given prolonged QTc.  - remains on Eliquis 2.5mg  BID for anticoagulation. Reduced dosing given age and renal function (creatinine 2.30 this AM).   3. CAD - s/p STEMI in 2016 and medically managed. She denies ant recent chest pain. NST performed as outlined above with results pending.  - continue ASA (was on this along with Eliquis prior to admission), Imdur and statin.  4. Chronic Combined Systolic and Diastolic CHF - EF 46-56% by echo in 05/2019. Remains on Imdur and Hydralazine. No longer on BB due to bradycardia. Appears euvolemic by examination. Quinapril and Torsemide held given AKI.   5. Severe Mitral Regurgitation - as documented by most recent echo as outlined above. By review of notes, she has been referred to the Structural Heart Team for consideration of MitraClip.   6. Accelerated HTN - BP has been significantly elevated since admission, ay 185/74 on most recent check. Quinapril held given  AKI. She has been started on PO Hydralazine 25mg  Q8H which can be further titrated as needed.   For questions or updates, please contact [place] Please consult [email or web address] for contact info under Cardiology/STEMI.   Signed, Erma Heritage , PA-C 11:11 AM 06/19/2019 Pager: [phone]  Ventricular tachycardia -- monomorphic  Presumed proarrhythmia from amio  AFib with RVR  Sinus node dysfunction with post termination pauses  MR  Severe  CAD with prior STEMI    Hypertension--urgency  Renal insufficiency grade 4 Estimated Creatinine Clearance: 16.9 mL/min     Presuming, given timing, that amio may have been the culprit of VT notwithstanding monomorphic nature-- reported association, and also true notwithstanding the QT prolongation  On the other hand the patient has a history of a prior MI.  He could serve as a substrate for the patient's ventricular tachycardia.  In the event that the Myoview scan demonstrates a significant scar that raises that possibility.  If there is no significant scar that would heighten the likelihood that this is amiodarone for arrhythmia albeit unusual.  If she were to have high risk perfusion anatomy, that could serve  as a trigger in the context of scar.  Aggressive medical therapy would almost certainly be the right strategy given her renal insufficiency and associated risks with catheterization.  Hence, I think device implantation is going to be appropriate to protect her against her documented bradycardia arrhythmias.  It would certainly be enhanced by AV nodal blocking agents use as an alternative to the amiodarone.  If we are unclear as to whether the ventricular tachycardia is substrate or proarrhythmia related, it may be worth considering not withstanding this lady's age to implant a defibrillator for both control of her tachyarrhythmia as well as protecting her from her bradycardia.  I told her these would be conversations that we would  have tomorrow once the Myoview scan is available    Agree with hyrdalazine with for BP and modest LV dysfunction, continue nitrates but will change from mono>> dinitrate

## 2019-06-19 NOTE — Progress Notes (Signed)
PROGRESS NOTE    Lauren Walker  CLE:[id number] DOB: 21-Jan-1930 DOA: 06/17/2019 PCP: Midge Minium, MD    Brief Narrative:  Patient is 86 year old female with history of breast cancer, heart failure with preserved ejection fraction, remote coronary artery disease, paroxysmal A. fib, severe mitral regurgitation, mild aortic stenosis, CKD stage IV, type 2 diabetes, hypertension hyperlipidemia and hypothyroidism who was recently admitted to the hospital with diastolic dysfunction and severe MR presents back to the emergency room with with syncopal episode, EMS found with SVT with heart rate 200 not relieved by amiodarone bolus, needed emergent DC cardioversion and resulting bradycardia. On recent hospitalization, she was treated with diuresis, found to have severe mitral regurgitation, treated with amiodarone.  She was also found to have prolonged pauses up to 6 seconds.  Cardiac cath not pursued because of advanced kidney disease.  Pacemaker not pursued because of advanced age.  On arrival to emergency room, remained in normal sinus rhythm and hemodynamically stable.  Mental status improved.  Seen and followed by cardiology.   Assessment & Plan:   Principal Problem:   V-tach [facility]) Active Problems:   Type 2 diabetes mellitus with other circulatory complications (HCC)   HTN (hypertension)   CAD (coronary artery disease)   Paroxysmal atrial fibrillation (HCC)   Mitral regurgitation moderate to severe based on echocardiogram in summer 2020   CKD stage G4/A1, GFR 15-29 and albumin creatinine ratio <30 mg/g (HCC)   Sinus bradycardia   Transaminitis  Presyncopal episode with ventricular tachycardia: Electrolytes are adequate. Amiodarone push followed by 100 J DC cardioversion. Currently remains on sinus rhythm with sinus pauses.  Discontinued amiodarone and all rate control medications. Continue monitoring in telemetry.  Seen by EP cardiology.  Ischemia work-up today with Lexiscan.   Further management as per cardiology.    Paroxysmal atrial fibrillation: Currently in sinus rhythm.  CHA2DS2-VASc score of 7.  On Eliquis.  Amiodarone discontinued because of prolonged QTC.  Chronic diastolic heart failure: Euvolemic.  Holding torsemide and quinapril.  Chronic kidney disease stage IV: Creatinine and renal functions at about her baseline.  2.3 today.  We will close monitor.  Hypertension: Blood pressure stable.  Discontinued nightly Toprol and Coreg.  Continue nitrates. Added hydralazine 25 mg 3 times daily along with as needed doses.  Blood pressure stable after receiving all medications in the morning.  Type 2 diabetes: On sliding scale.  She is not on treatment at home.  Hemoglobin A1c 5.3.  Diet controlled.  No indication for treatment.  Hypothyroidism: Recent TSH 1.7.  Remains on thyroxine replacement.   DVT prophylaxis: Eliquis Code Status: Partial Family Communication: None present at the bedside. Disposition Plan: Status is: Inpatient  Remains inpatient appropriate because:Inpatient level of care appropriate due to severity of illness   Dispo: The patient is from: Home              Anticipated d/c is to: Home              Anticipated d/c date is: 2 days              Patient currently is not medically stable to d/c.          Consultants:   Cardiology  Procedures:   None  Antimicrobials:   None   Subjective: Patient seen and examined.  No overnight events.  Going for the test in the morning.  Telemetry shows sinus rhythm with multiple pauses less than 3 seconds.  Objective: Vitals:  06/19/19 1009 06/19/19 1011 06/19/19 1013 06/19/19 1206  BP: (!) 208/80 (!) 188/75 (!) 185/74 101/77  Pulse:    (!) 59  Resp: (!) 25 20 19 18   Temp:    97.7 F (36.5 C)  TempSrc:    Oral  SpO2:    100%  Weight:      Height:        Intake/Output Summary (Last 24 hours) at 06/19/2019 1323 Last data filed at 06/19/2019 0800 Gross per 24 hour  Intake 120  ml  Output --  Net 120 ml   Filed Weights   06/17/19 1917 06/18/19 1523  Weight: 75.3 kg 76.3 kg    Examination:  General exam: Appears calm and comfortable  Respiratory system: Clear to auscultation. Respiratory effort normal. Cardiovascular system: S1 & S2 heard, RRR. No JVD, murmurs, rubs, gallops or clicks. No pedal edema. Gastrointestinal system: Abdomen is nondistended, soft and nontender. No organomegaly or masses felt. Normal bowel sounds heard. Central nervous system: Alert and oriented. No focal neurological deficits. Extremities: Symmetric 5 x 5 power. Skin: No rashes, lesions or ulcers Psychiatry: Judgement and insight appear normal. Mood & affect appropriate.     Data Reviewed: I have personally reviewed following labs and imaging studies  CBC: Recent Labs  Lab 06/17/19 1356 06/17/19 1932 06/17/19 1959 06/17/19 2132 06/18/19 0500  WBC 6.7 11.2*  --   --  5.8  NEUTROABS 4.6 6.4  --   --   --   HGB 9.6* 10.1* 10.2* 9.9* 9.6*  HCT 30.4* 33.3* 30.0* 29.0* 30.4*  MCV 95.9 100.6*  --   --  98.7  PLT 237 326  --   --  956   Basic Metabolic Panel: Recent Labs  Lab 06/17/19 1932 06/17/19 1959 06/17/19 2132 06/18/19 0500 06/19/19 0339  NA 139 138 141 142 141  K 4.2 4.1 3.9 4.1 4.4  CL 101 102  --  106 108  CO2 19*  --   --  26 26  GLUCOSE 316* 297*  --  126* 108*  BUN 42* 38*  --  41* 40*  CREATININE 2.48* 2.50*  --  2.42* 2.30*  CALCIUM 8.6*  --   --  8.8* 8.6*  MG 2.4  --   --   --   --    GFR: Estimated Creatinine Clearance: 16.9 mL/min (A) (by C-G formula based on SCr of 2.3 mg/dL (H)). Liver Function Tests: Recent Labs  Lab 06/17/19 1932 06/18/19 0500 06/19/19 0339  AST 190* 93* 49*  ALT 59* 51* 39  ALKPHOS 106 89 64  BILITOT 0.7 0.9 0.6  PROT 5.5* 5.2* 4.6*  ALBUMIN 3.0* 2.7* 2.5*   No results for input(s): LIPASE, AMYLASE in the last 168 hours. No results for input(s): AMMONIA in the last 168 hours. Coagulation Profile: No results  for input(s): INR, PROTIME in the last 168 hours. Cardiac Enzymes: No results for input(s): CKTOTAL, CKMB, CKMBINDEX, TROPONINI in the last 168 hours. BNP (last 3 results) No results for input(s): PROBNP in the last 8760 hours. HbA1C: No results for input(s): HGBA1C in the last 72 hours. CBG: Recent Labs  Lab 06/18/19 1326 06/18/19 1811 06/18/19 2137 06/19/19 0627 06/19/19 1202  GLUCAP 157* 102* 146* 111* 147*   Lipid Profile: No results for input(s): CHOL, HDL, LDLCALC, TRIG, CHOLHDL, LDLDIRECT in the last 72 hours. Thyroid Function Tests: No results for input(s): TSH, T4TOTAL, FREET4, T3FREE, THYROIDAB in the last 72 hours. Anemia Panel: Recent Labs  06/17/19 1356  VITAMINB12 1,136*  FOLATE 51.2  RETICCTPCT 2.5   Sepsis Labs: No results for input(s): PROCALCITON, LATICACIDVEN in the last 168 hours.  Recent Results (from the past 240 hour(s))  Respiratory Panel by RT PCR (Flu A&B, Covid) - Nasopharyngeal Swab     Status: None   Collection Time: 06/17/19  7:32 PM   Specimen: Nasopharyngeal Swab  Result Value Ref Range Status   SARS Coronavirus 2 by RT PCR NEGATIVE NEGATIVE Final    Comment: (NOTE) SARS-CoV-2 target nucleic acids are NOT DETECTED. The SARS-CoV-2 RNA is generally detectable in upper respiratoy specimens during the acute phase of infection. The lowest concentration of SARS-CoV-2 viral copies this assay can detect is 131 copies/mL. A negative result does not preclude SARS-Cov-2 infection and should not be used as the sole basis for treatment or other patient management decisions. A negative result may occur with  improper specimen collection/handling, submission of specimen other than nasopharyngeal swab, presence of viral mutation(s) within the areas targeted by this assay, and inadequate number of viral copies (<131 copies/mL). A negative result must be combined with clinical observations, patient history, and epidemiological information.  The expected result is Negative. Fact Sheet for Patients:  PinkCheek.be Fact Sheet for Healthcare Providers:  GravelBags.it This test is not yet ap proved or cleared by the [place] FDA and  has been authorized for detection and/or diagnosis of SARS-CoV-2 by FDA under an Emergency Use Authorization (EUA). This EUA will remain  in effect (meaning this test can be used) for the duration of the COVID-19 declaration under Section 564(b)(1) of the Act, 21 U.S.C. section 360bbb-3(b)(1), unless the authorization is terminated or revoked sooner.    Influenza A by PCR NEGATIVE NEGATIVE Final   Influenza B by PCR NEGATIVE NEGATIVE Final    Comment: (NOTE) The Xpert Xpress SARS-CoV-2/FLU/RSV assay is intended as an aid in  the diagnosis of influenza from Nasopharyngeal swab specimens and  should not be used as a sole basis for treatment. Nasal washings and  aspirates are unacceptable for Xpert Xpress SARS-CoV-2/FLU/RSV  testing. Fact Sheet for Patients: PinkCheek.be Fact Sheet for Healthcare Providers: GravelBags.it This test is not yet approved or cleared by the [place] FDA and  has been authorized for detection and/or diagnosis of SARS-CoV-2 by  FDA under an Emergency Use Authorization (EUA). This EUA will remain  in effect (meaning this test can be used) for the duration of the  Covid-19 declaration under Section 564(b)(1) of the Act, 21  U.S.C. section 360bbb-3(b)(1), unless the authorization is  terminated or revoked. Performed at [facility], [place] [address], [place], [place] [address]          Radiology Studies: NM Myocar Multi W/Spect Tamela Oddi Motion / EF  Result Date: 06/19/2019 CLINICAL DATA:  Coronary artery disease, diabetes, hypertension. Recent syncope. EXAM: MYOCARDIAL IMAGING WITH SPECT (REST AND PHARMACOLOGIC-STRESS) GATED LEFT VENTRICULAR  WALL MOTION STUDY LEFT VENTRICULAR EJECTION FRACTION TECHNIQUE: Standard myocardial SPECT imaging was performed after resting intravenous injection of 10 mCi Tc-47m tetrofosmin. Subsequently, intravenous infusion of Lexiscan was performed under the supervision of the Cardiology staff. At peak effect of the drug, 30 mCi Tc-24m tetrofosmin was injected intravenously and standard myocardial SPECT imaging was performed. Quantitative gated imaging was also performed to evaluate left ventricular wall motion, and estimate left ventricular ejection fraction. COMPARISON:  Chest radiograph 06/17/2019 FINDINGS: Perfusion: Large matched defect in the inferolateral wall extending from the apex to the base. Small matched anteroseptal defect extending  from the apex to the mid heart. No definite inducible ischemia. There is some reverse redistribution along the septal wall and anterior wall, possibly related to prior intervention. Wall Motion: Mild hypokinesis and poor wall thickening along the lateral wall. Dilated left ventricle. Left Ventricular Ejection Fraction: 46 % End diastolic volume 182 ml (prominently elevated) End systolic volume 90 ml (prominently elevated) IMPRESSION: 1. Large scar along the inferolateral wall and smaller scar along the anteroseptal wall. No inducible ischemia is identified. .  Dilated left ventricle. 2. Mild poor wall thickening and hypokinesis along the lateral wall. 3. Left ventricular ejection fraction 46% 4. Non invasive risk stratification*: High *2012 Appropriate Use Criteria for Coronary Revascularization Focused Update: J Am Coll Cardiol. 9937;16(9):678-938. http://content.[email or web address].aspx?articleid=[email or web address] Electronically Signed   By: Van Clines M.D.   On: 06/19/2019 13:09   DG Chest Portable 1 View  Result Date: 06/17/2019 CLINICAL DATA:  Dizziness for 1 hour EXAM: PORTABLE CHEST 1 VIEW COMPARISON:  05/29/2019 FINDINGS: Cardiac shadow is stable. Aortic calcifications  are again seen. Increasing left-sided effusion and likely underlying infiltrate is present. The right lung is clear. No bony abnormality is noted. IMPRESSION: Increasing left-sided effusion and likely underlying infiltrate. Electronically Signed   By: Inez Catalina M.D.   On: 06/17/2019 20:20        Scheduled Meds: . anastrozole  1 mg Oral QHS  . apixaban  2.5 mg Oral BID  . aspirin EC  81 mg Oral Daily  . atorvastatin  40 mg Oral QHS  . azelastine  1 spray Each Nare BID   Or  . fluticasone  1 spray Each Nare BID  . cholecalciferol  2,000 Units Oral Daily  . ferrous sulfate  325 mg Oral Q breakfast  . hydrALAZINE  25 mg Oral Q8H  . insulin aspart  0-15 Units Subcutaneous TID WC  . insulin aspart  0-5 Units Subcutaneous QHS  . isosorbide dinitrate  40 mg Oral BID  . levothyroxine  150 mcg Oral Q0600  . magnesium oxide  400 mg Oral QHS   Continuous Infusions:   LOS: 2 days    Time spent: 25 minutes    Barb Merino, MD [facility] Pager [phone]

## 2019-06-19 NOTE — Progress Notes (Signed)
    Reviewed stress test results with the patient which showed a large scar along the inferolateral wall and smaller scar along the anteroseptal wall with no inducible ischemia. Dr. Caryl Comes aware of the results as well. He plans to further discuss with her tomorrow in regards to PPM placement +/- ICD.   Signed, Erma Heritage, PA-C 06/19/2019, 3:10 PM Pager: [phone]

## 2019-06-20 ENCOUNTER — Other Ambulatory Visit: Payer: Self-pay | Admitting: Oncology

## 2019-06-20 ENCOUNTER — Inpatient Hospital Stay (HOSPITAL_COMMUNITY)
Admission: EM | Disposition: A | Discharge status: Home / Self Care | Payer: Self-pay | Source: Home / Self Care | Attending: Internal Medicine

## 2019-06-20 DIAGNOSIS — I472 Ventricular tachycardia: Secondary | ICD-10-CM | POA: Diagnosis not present

## 2019-06-20 DIAGNOSIS — I255 Ischemic cardiomyopathy: Secondary | ICD-10-CM | POA: Diagnosis not present

## 2019-06-20 HISTORY — PX: ICD IMPLANT: EP1208

## 2019-06-20 LAB — GLUCOSE, CAPILLARY
Glucose-Capillary: 125 mg/dL — ABNORMAL HIGH (ref 70–99)
Glucose-Capillary: 155 mg/dL — ABNORMAL HIGH (ref 70–99)
Glucose-Capillary: 87 mg/dL (ref 70–99)
Glucose-Capillary: 145 mg/dL — ABNORMAL HIGH (ref 70–99)

## 2019-06-20 LAB — IRON AND TIBC
Iron: 105 ug/dL (ref 41–142)
Saturation Ratios: 33 % (ref 21–57)
TIBC: 323 ug/dL (ref 236–444)
UIBC: 218 ug/dL (ref 120–384)

## 2019-06-20 LAB — FERRITIN: Ferritin: 52 ng/mL (ref 11–307)

## 2019-06-20 SURGERY — ICD IMPLANT

## 2019-06-20 MED ORDER — HEPARIN (PORCINE) IN NACL 1000-0.9 UT/500ML-% IV SOLN
INTRAVENOUS | Status: AC
  Filled 2019-06-20: qty 500

## 2019-06-20 MED ORDER — CEFAZOLIN SODIUM-DEXTROSE 1-4 GM/50ML-% IV SOLN
1.0000 g | Freq: Four times a day (QID) | INTRAVENOUS | Status: AC
  Administered 2019-06-20 – 2019-06-21 (×3): 1 g via INTRAVENOUS
  Filled 2019-06-20 (×3): qty 50

## 2019-06-20 MED ORDER — MIDAZOLAM HCL 5 MG/5ML IJ SOLN
INTRAMUSCULAR | Status: AC
  Filled 2019-06-20: qty 5

## 2019-06-20 MED ORDER — ONDANSETRON HCL 4 MG/2ML IJ SOLN: 4.0000 mg | Freq: Four times a day (QID) | INTRAMUSCULAR | Status: DC | PRN

## 2019-06-20 MED ORDER — LIDOCAINE HCL 1 % IJ SOLN
INTRAMUSCULAR | Status: AC
  Filled 2019-06-20: qty 20

## 2019-06-20 MED ORDER — HEPARIN (PORCINE) IN NACL 1000-0.9 UT/500ML-% IV SOLN
INTRAVENOUS | Status: DC | PRN
  Administered 2019-06-20: 500 mL

## 2019-06-20 MED ORDER — SODIUM CHLORIDE 0.9 % IV SOLN
INTRAVENOUS | Status: AC
  Filled 2019-06-20: qty 2

## 2019-06-20 MED ORDER — ACETAMINOPHEN 325 MG PO TABS
325.0000 mg | ORAL_TABLET | ORAL | Status: DC | PRN
  Administered 2019-06-20 – 2019-06-21 (×2): 650 mg via ORAL
  Filled 2019-06-20 (×2): qty 2

## 2019-06-20 MED ORDER — IOHEXOL 350 MG/ML SOLN
INTRAVENOUS | Status: DC | PRN
  Administered 2019-06-20: 15:00:00 15 mL

## 2019-06-20 MED ORDER — FENTANYL CITRATE (PF) 100 MCG/2ML IJ SOLN
INTRAMUSCULAR | Status: DC | PRN
  Administered 2019-06-20: 25 ug via INTRAVENOUS
  Administered 2019-06-20 (×3): 12.5 ug via INTRAVENOUS

## 2019-06-20 MED ORDER — MIDAZOLAM HCL 5 MG/5ML IJ SOLN
INTRAMUSCULAR | Status: DC | PRN
  Administered 2019-06-20 (×3): 1 mg via INTRAVENOUS

## 2019-06-20 MED ORDER — FENTANYL CITRATE (PF) 100 MCG/2ML IJ SOLN
INTRAMUSCULAR | Status: AC
  Filled 2019-06-20: qty 2

## 2019-06-20 MED ORDER — CEFAZOLIN SODIUM-DEXTROSE 2-4 GM/100ML-% IV SOLN
INTRAVENOUS | Status: AC
  Filled 2019-06-20: qty 100

## 2019-06-20 MED ORDER — LIDOCAINE HCL (PF) 1 % IJ SOLN
INTRAMUSCULAR | Status: DC | PRN
  Administered 2019-06-20: 15:00:00 45 mL

## 2019-06-20 SURGICAL SUPPLY — 8 items
CABLE SURGICAL S-101-97-12 (CABLE) ×3 IMPLANT
ICD GALLANT DR CDDRA500Q (ICD Generator) ×2 IMPLANT
LEAD DURATA 7122Q-58CM (Lead) ×2 IMPLANT
LEAD TENDRIL MRI 46CM LPA1200M (Lead) ×2 IMPLANT
PAD PRO RADIOLUCENT 2001M-C (PAD) ×3 IMPLANT
SHEATH 7FR PRELUDE SNAP 13 (SHEATH) ×2 IMPLANT
SHEATH 8FR PRELUDE SNAP 13 (SHEATH) ×2 IMPLANT
TRAY PACEMAKER INSERTION (PACKS) ×3 IMPLANT

## 2019-06-20 NOTE — H&P (View-Only) (Signed)
Progress Note  Patient Name: Frutoso Schatz Date of Encounter: 06/20/2019  Primary Cardiologist: Jenne Campus, MD   Subjective   Feels OK, no CP, SOB  Inpatient Medications    Scheduled Meds: . anastrozole  1 mg Oral QHS  . aspirin EC  81 mg Oral Daily  . atorvastatin  40 mg Oral QHS  . azelastine  1 spray Each Nare BID   Or  . fluticasone  1 spray Each Nare BID  . cholecalciferol  2,000 Units Oral Daily  . ferrous sulfate  325 mg Oral Q breakfast  . gentamicin irrigation  80 mg Irrigation On Call  . hydrALAZINE  25 mg Oral Q8H  . insulin aspart  0-15 Units Subcutaneous TID WC  . insulin aspart  0-5 Units Subcutaneous QHS  . isosorbide dinitrate  40 mg Oral BID  . levothyroxine  150 mcg Oral Q0600  . magnesium oxide  400 mg Oral QHS   Continuous Infusions: . sodium chloride    . sodium chloride    .  ceFAZolin (ANCEF) IV     PRN Meds: acetaminophen **OR** acetaminophen, fluticasone, ondansetron **OR** ondansetron (ZOFRAN) IV   Vital Signs    Vitals:   06/20/19 0028 06/20/19 0521 06/20/19 0633 06/20/19 0814  BP: (!) 130/36 (!) 185/68 (!) 144/48 (!) 156/41  Pulse: 62 70  (!) 59  Resp: 13 (!) _0 Temp: 98.2 F (36.8 C) 98.4 F (36.9 C)  97.8 F (36.6 C)  TempSrc: Oral Oral  Oral  SpO2: 99% 99%  99%  Weight:      Height:        Intake/Output Summary (Last 24 hours) at 06/20/2019 0902 Last data filed at 06/20/2019 0816 Gross per 24 hour  Intake 480 ml  Output --  Net 480 ml   Last 3 Weights 06/18/2019 06/17/2019 06/07/2019  Weight (lbs) 168 lb 4.8 oz 166 lb 166 lb 8 oz  Weight (kg) 76.34 kg 75.297 kg 75.524 kg      Telemetry    SB/SR, PVCs - Personally Reviewed  ECG    No new EKGs - Personally Reviewed  Physical Exam   GEN: No acute distress.   Neck: No JVD Cardiac: RRR, soft SM, rubs, or gallops.  Respiratory: CTA b/l. GI: Soft, nontender, non-distended  MS: No edema; No deformity. Neuro:  Nonfocal  Psych: Normal affect   Labs     High Sensitivity Troponin:   Recent Labs  Lab 05/25/19 1221 05/27/19 0940 05/27/19 1255 06/17/19 1932 06/17/19 2120  TROPONINIHS 576* 235* 254* 35* 311*      Chemistry Recent Labs  Lab 06/17/19 1932 06/17/19 1932 06/17/19 1959 06/17/19 1959 06/17/19 2132 06/18/19 0500 06/19/19 0339  NA 139   < > 138   < > 141 142 141  K 4.2   < > 4.1   < > 3.9 4.1 4.4  CL 101   < > 102  --   --  106 108  CO2 19*  --   --   --   --  26 26  GLUCOSE 316*   < > 297*  --   --  126* 108*  BUN 42*   < > 38*  --   --  41* 40*  CREATININE 2.48*   < > 2.50*  --   --  2.42* 2.30*  CALCIUM 8.6*  --   --   --   --  8.8* 8.6*  PROT 5.5*  --   --   --   --  5.2* 4.6*  ALBUMIN 3.0*  --   --   --   --  2.7* 2.5*  AST 190*  --   --   --   --  93* 49*  ALT 59*  --   --   --   --  51* 39  ALKPHOS 106  --   --   --   --  89 64  BILITOT 0.7  --   --   --   --  0.9 0.6  GFRNONAA 17*  --   --   --   --  17* 18*  GFRAA 19*  --   --   --   --  20* 21*  ANIONGAP 19*  --   --   --   --  10 7   < > = values in this interval not displayed.     Hematology Recent Labs  Lab 06/17/19 1356 06/17/19 1356 06/17/19 1932 06/17/19 1932 06/17/19 1959 06/17/19 2132 06/18/19 0500  WBC 6.7  --  11.2*  --   --   --  5.8  RBC 3.17*  3.20*  --  3.31*  --   --   --  3.08*  HGB 9.6*   < > 10.1*   < > 10.2* 9.9* 9.6*  HCT 30.4*   < > 33.3*   < > 30.0* 29.0* 30.4*  MCV 95.9  --  100.6*  --   --   --  98.7  MCH 30.3  --  30.5  --   --   --  31.2  MCHC 31.6  --  30.3  --   --   --  31.6  RDW 14.1  --  14.1  --   --   --  14.3  PLT 237  --  326  --   --   --  206   < > = values in this interval not displayed.    BNPNo results for input(s): BNP, PROBNP in the last 168 hours.   DDimer No results for input(s): DDIMER in the last 168 hours.   Radiology      Cardiac Studies   NM Myocar Multi W/Spect W/Wall Motion / EF Result Date: 06/19/2019 CLINICAL DATA:  Coronary artery disease, diabetes, hypertension. Recent  syncope. EXAM: MYOCARDIAL IMAGING WITH SPECT (REST AND PHARMACOLOGIC-STRESS) GATED LEFT VENTRICULAR WALL MOTION STUDY LEFT VENTRICULAR EJECTION FRACTION TECHNIQUE: Standard myocardial SPECT imaging was performed after resting intravenous injection of 10 mCi Tc-17mtetrofosmin. Subsequently, intravenous infusion of Lexiscan was performed under the supervision of the Cardiology staff. At peak effect of the drug, 30 mCi Tc-936metrofosmin was injected intravenously and standard myocardial SPECT imaging was performed. Quantitative gated imaging was also performed to evaluate left ventricular wall motion, and estimate left ventricular ejection fraction. COMPARISON:  Chest radiograph 06/17/2019 FINDINGS: Perfusion: Large matched defect in the inferolateral wall extending from the apex to the base. Small matched anteroseptal defect extending from the apex to the mid heart. No definite inducible ischemia. There is some reverse redistribution along the septal wall and anterior wall, possibly related to prior intervention. Wall Motion: Mild hypokinesis and poor wall thickening along the lateral wall. Dilated left ventricle. Left Ventricular Ejection Fraction: 46 % End diastolic volume 16003l (prominently elevated) End systolic volume 90 ml (prominently elevated) IMPRESSION: 1. Large scar along the inferolateral wall and smaller scar along the anteroseptal wall. No inducible ischemia is identified. .  Dilated left ventricle. 2. Mild poor wall thickening  and hypokinesis along the lateral wall. 3. Left ventricular ejection fraction 46% 4. Non invasive risk stratification*: High *2012 Appropriate Use Criteria for Coronary Revascularization Focused Update: J Am Coll Cardiol. 7673;41(9):379-024. http://content.[email or web address]?articleid=[email or web address] Electronically Signed   By: Van Clines M.D.   On: 06/19/2019 13:09     05/25/2019: TTE IMPRESSIONS  1. Left ventricular ejection fraction, by estimation, is 45 to  50%. The  left ventricle has mildly decreased function. The left ventricle  demonstrates regional wall motion abnormalities (see scoring  diagram/findings for description). There is moderate  left ventricular hypertrophy. Left ventricular diastolic parameters are  indeterminate. There is moderate hypokinesis of the left ventricular,  entire inferior wall.  2. Right ventricular systolic function is normal. The right ventricular  size is normal. There is normal pulmonary artery systolic pressure.  3. Left atrial size was severely dilated.  4. Large pleural effusion in the left lateral region.  5. The mitral valve is grossly normal. Moderate to severe mitral valve  regurgitation.  6. The aortic valve is tricuspid. Aortic valve regurgitation is not  visualized. Mild to moderate aortic valve stenosis. Aortic valve area, by  VTI measures 1.49 cm. Aortic valve mean gradient measures 10.4 mmHg.  Aortic valve Vmax measures 2.17 m/s.  7. The inferior vena cava is dilated in size with >50% respiratory  variability, suggesting right atrial pressure of 8 mmHg.   Patient Profile     86 y.o. female w/PMHx of breast cancer (stage 1A invasive ductal CA  E+/P+/HER-2 neg) ,HFpEF,remote CAD(cath 2016 -RCA STEMI 12/2014, managed medically),paroxysmal atrial fibrillation,severe mitral regurgitation,mild AS,CKD (IV),type 2 diabetes mellitus,hypertension, hyperlipidemia, and hypothyroidism.    She was recently admitted to [facility] with complaints of SOB and LE edema.  She was dx with acute CHF and started on IV diuretics.  hsTrop and BNp were elevated.  2D echo showed mildly reduced LVF with EF 45% with moderate to severe MR.  During that admission she had PAF with post-termination pauses up to 6 seconds and presyncope. She was seen by EP and recommended a conservative approach given her advanced age.  Plan was to maintain NSR with Amio 289m BID and was on Eliquis for CHADS2VASC socre of 7 It was felt  that PAF was due to severe MR.  It was decided not to pursue cath due to advanced age and CKD.  It was also decided to refer to structural heart after that hospital admit to see if she would be a candidate for MitraClip.  She was discharged hom on 4/13 on Torsemide 250mdaily.     She had a syncopal event at home EMS was called and initially HR was in the 200's with wide complex tachycardia felt to be VT. She was given 15064mV Amio and cardioverted with 100J to NSR.  [facility] HR was in the 40-50's.  Labs showed K+ 4.1 and Mag 2.4.  Creatinine 2.5 and Hbg 10.2. EKG showed junctional bradycardia at 43bpm with severe global ischemic ST changes and markedly prolonged QTc at 576m3mand anterior infarct and minimal ST elevated in aVR  She was admitted, her amiodarone stopped with marked QT prolongation and bradycardia  Assessment & Plan    1. Presyncope/VT     MMVT 2. Prolonged qt     Suspect 2/2 amiodarone     Still looks a little long on tele     No VT last 24hours 3. Bradycardiaknown recent h/o post termination pauses     She has had  some intermittent brief junctional rates high 20's-30's early AM hours   Device implant is planned today.  PPM vs ICD. In d/w the patient, she feels most comfortable after her discussion with Dr. Caryl Comes to pursue ICD implant Dr. Lovena Le will discuss further with her today  I have discussed implant procedure with the patient, potential risks.  She is agreeable to proceed pending her d/w Dr. Lovena Le  Will minimize contrast given her CKD  Orders to follow   4. Paroxysmal Atrial Fibrillation     amio stopped     Held eliquis for procedure today  5. CAD - s/p STEMI in 2016 and medically managed.  - continue ASA (was on this along with Eliquis prior to admission), Imdur and statin.   Stress test with scar, no ischemia  6. Chronic Combined Systolic and Diastolic CHF - EF 83-41% by echo in 05/2019 Remains on Imdur and Hydralazine.  Appears  euvolemic by examination.  Quinapril and Torsemide held given AKI.  Creat trending down 2.3 today Baseline looks about 2.0-2.2  7. Severe Mitral Regurgitation By review of notes, she has been referred to the Structural Heart Team for consideration of MitraClip.   8. Accelerated HTN - BP has been significantly elevated since admission, Quinapril held given AKI. She has been started on PO Hydralazine 100m Q8H  Looks better   For questions or updates, please contact [place]Please consult [email or web address] for contact info under   Signed, RBaldwin Jamaica PA-C  06/20/2019, 9:02 AM    EP attending  Patient seen and examined.  Agree with the findings as noted above.  The patient has sinus node dysfunction, and sustained monomorphic ventricular tachycardia in the setting of an old inferior myocardial infarction.  She had syncope associated with her ventricular tachycardia.  The patient had been on amiodarone.  I discussed the treatment options with the patient and her husband in detail.  The risks, goals, benefits, and expectations of dual-chamber ICD insertion were reviewed and she wishes to proceed.  GCristopher Peru MD

## 2019-06-20 NOTE — Progress Notes (Signed)
PROGRESS NOTE    ANALLELI Walker  BTD:[id number] DOB: 07-Jan-1930 DOA: 06/17/2019 PCP: Midge Minium, MD    Brief Narrative:  Patient is 86 year old female with history of breast cancer, heart failure with preserved ejection fraction, remote coronary artery disease, paroxysmal A. fib, severe mitral regurgitation, mild aortic stenosis, CKD stage IV, type 2 diabetes, hypertension hyperlipidemia and hypothyroidism who was recently admitted to the hospital with diastolic dysfunction and severe MR presents back to the emergency room with with syncopal episode, EMS found with SVT with heart rate 200 not relieved by amiodarone bolus, needed emergent DC cardioversion and resulting bradycardia. On recent hospitalization, she was treated with diuresis, found to have severe mitral regurgitation, treated with amiodarone.  She was also found to have prolonged pauses up to 6 seconds.  Cardiac cath not pursued because of advanced kidney disease.  Pacemaker not pursued because of advanced age.  On arrival to emergency room, remained in normal sinus rhythm and hemodynamically stable.  Mental status improved.  Seen and followed by cardiology.   Assessment & Plan:   Principal Problem:   V-tach Lauren Walker) Active Problems:   Type 2 diabetes mellitus with other circulatory complications (HCC)   HTN (hypertension)   CAD (coronary artery disease)   Paroxysmal atrial fibrillation (HCC)   Mitral regurgitation moderate to severe based on echocardiogram in summer 2020   CKD stage G4/A1, GFR 15-29 and albumin creatinine ratio <30 mg/g (HCC)   Sinus bradycardia   Transaminitis  Presyncopal episode with ventricular tachycardia: Electrolytes are adequate. Prolonged QT Amiodarone push followed by 100 J DC cardioversion. Currently remains on sinus rhythm with sinus pauses.  Discontinued amiodarone and all rate control medications. Continue monitoring in telemetry.  Seen by EP cardiology. Possible AICD / Pacemaker  today.    Paroxysmal atrial fibrillation: Currently in sinus rhythm.  CHA2DS2-VASc score of 7.  On Eliquis.  Amiodarone discontinued because of prolonged QTC.  Chronic diastolic heart failure: Euvolemic.  Holding torsemide and quinapril.  Chronic kidney disease stage IV: Creatinine and renal functions at about her baseline.  2.3 .  We will close monitor.  Hypertension: Blood pressure stable.  Discontinued nightly Toprol and Coreg.  Continue nitrates. Added hydralazine 25 mg 3 times daily along with as needed doses.  Blood pressure acceptable after receiving morning doses.  Type 2 diabetes: On sliding scale.  She is not on treatment at home.  Hemoglobin A1c 5.3.  Diet controlled.  No indication for treatment.  Hypothyroidism: Recent TSH 1.7.  Remains on thyroxine replacement.   DVT prophylaxis: Eliquis Code Status: Partial Family Communication: None present at the bedside. Disposition Plan: Status is: Inpatient  Remains inpatient appropriate because:Inpatient level of care appropriate due to severity of illness   Dispo: The patient is from: Home              Anticipated d/c is to: Home              Anticipated d/c date is: 2 days              Patient currently is not medically stable to d/c.  Plan for inpatient surgical procedures.          Consultants:   Cardiology  Procedures:   None  Antimicrobials:   None   Subjective: Patient seen and examined.  No overnight events.  She is looking forward for procedure.  Objective: Vitals:   06/19/19 1952 06/20/19 0028 06/20/19 0521 06/20/19 0633  BP: (!) 152/43 (!) 130/36 Marland Kitchen)  185/68 (!) 144/48  Pulse:  62 70   Resp: 17 13 (!) 24 19  Temp:  98.2 F (36.8 C) 98.4 F (36.9 C)   TempSrc:  Oral Oral   SpO2: 99% 99% 99%   Weight:      Height:        Intake/Output Summary (Last 24 hours) at 06/20/2019 0806 Last data filed at 06/19/2019 1400 Gross per 24 hour  Intake 240 ml  Output --  Net 240 ml   Filed Weights    06/17/19 1917 06/18/19 1523  Weight: 75.3 kg 76.3 kg    Examination:  General exam: Appears calm and comfortable  Respiratory system: Clear to auscultation. Respiratory effort normal. Cardiovascular system: S1 & S2 heard, RRR. No JVD, murmurs, rubs, gallops or clicks. No pedal edema. Gastrointestinal system: Abdomen is nondistended, soft and nontender. No organomegaly or masses felt. Normal bowel sounds heard. Central nervous system: Alert and oriented. No focal neurological deficits. Extremities: Symmetric 5 x 5 power. Skin: No rashes, lesions or ulcers Psychiatry: Judgement and insight appear normal. Mood & affect appropriate.     Data Reviewed: I have personally reviewed following labs and imaging studies  CBC: Recent Labs  Lab 06/17/19 1356 06/17/19 1932 06/17/19 1959 06/17/19 2132 06/18/19 0500  WBC 6.7 11.2*  --   --  5.8  NEUTROABS 4.6 6.4  --   --   --   HGB 9.6* 10.1* 10.2* 9.9* 9.6*  HCT 30.4* 33.3* 30.0* 29.0* 30.4*  MCV 95.9 100.6*  --   --  98.7  PLT 237 326  --   --  678   Basic Metabolic Panel: Recent Labs  Lab 06/17/19 1932 06/17/19 1959 06/17/19 2132 06/18/19 0500 06/19/19 0339  NA 139 138 141 142 141  K 4.2 4.1 3.9 4.1 4.4  CL 101 102  --  106 108  CO2 19*  --   --  26 26  GLUCOSE 316* 297*  --  126* 108*  BUN 42* 38*  --  41* 40*  CREATININE 2.48* 2.50*  --  2.42* 2.30*  CALCIUM 8.6*  --   --  8.8* 8.6*  MG 2.4  --   --   --   --    GFR: Estimated Creatinine Clearance: 16.9 mL/min (A) (by C-G formula based on SCr of 2.3 mg/dL (H)). Liver Function Tests: Recent Labs  Lab 06/17/19 1932 06/18/19 0500 06/19/19 0339  AST 190* 93* 49*  ALT 59* 51* 39  ALKPHOS 106 89 64  BILITOT 0.7 0.9 0.6  PROT 5.5* 5.2* 4.6*  ALBUMIN 3.0* 2.7* 2.5*   No results for input(s): LIPASE, AMYLASE in the last 168 hours. No results for input(s): AMMONIA in the last 168 hours. Coagulation Profile: No results for input(s): INR, PROTIME in the last 168  hours. Cardiac Enzymes: No results for input(s): CKTOTAL, CKMB, CKMBINDEX, TROPONINI in the last 168 hours. BNP (last 3 results) No results for input(s): PROBNP in the last 8760 hours. HbA1C: No results for input(s): HGBA1C in the last 72 hours. CBG: Recent Labs  Lab 06/19/19 0627 06/19/19 1202 06/19/19 1645 06/19/19 2117 06/20/19 0618  GLUCAP 111* 147* 232* 95 125*   Lipid Profile: No results for input(s): CHOL, HDL, LDLCALC, TRIG, CHOLHDL, LDLDIRECT in the last 72 hours. Thyroid Function Tests: No results for input(s): TSH, T4TOTAL, FREET4, T3FREE, THYROIDAB in the last 72 hours. Anemia Panel: Recent Labs    06/17/19 1356  VITAMINB12 1,136*  FOLATE 51.2  RETICCTPCT 2.5  Sepsis Labs: No results for input(s): PROCALCITON, LATICACIDVEN in the last 168 hours.  Recent Results (from the past 240 hour(s))  Respiratory Panel by RT PCR (Flu A&B, Covid) - Nasopharyngeal Swab     Status: None   Collection Time: 06/17/19  7:32 PM   Specimen: Nasopharyngeal Swab  Result Value Ref Range Status   SARS Coronavirus 2 by RT PCR NEGATIVE NEGATIVE Final    Comment: (NOTE) SARS-CoV-2 target nucleic acids are NOT DETECTED. The SARS-CoV-2 RNA is generally detectable in upper respiratoy specimens during the acute phase of infection. The lowest concentration of SARS-CoV-2 viral copies this assay can detect is 131 copies/mL. A negative result does not preclude SARS-Cov-2 infection and should not be used as the sole basis for treatment or other patient management decisions. A negative result may occur with  improper specimen collection/handling, submission of specimen other than nasopharyngeal swab, presence of viral mutation(s) within the areas targeted by this assay, and inadequate number of viral copies (<131 copies/mL). A negative result must be combined with clinical observations, patient history, and epidemiological information. The expected result is Negative. Fact Sheet for  Patients:  PinkCheek.be Fact Sheet for Healthcare Providers:  GravelBags.it This test is not yet ap proved or cleared by the [place] FDA and  has been authorized for detection and/or diagnosis of SARS-CoV-2 by FDA under an Emergency Use Authorization (EUA). This EUA will remain  in effect (meaning this test can be used) for the duration of the COVID-19 declaration under Section 564(b)(1) of the Act, 21 U.S.C. section 360bbb-3(b)(1), unless the authorization is terminated or revoked sooner.    Influenza A by PCR NEGATIVE NEGATIVE Final   Influenza B by PCR NEGATIVE NEGATIVE Final    Comment: (NOTE) The Xpert Xpress SARS-CoV-2/FLU/RSV assay is intended as an aid in  the diagnosis of influenza from Nasopharyngeal swab specimens and  should not be used as a sole basis for treatment. Nasal washings and  aspirates are unacceptable for Xpert Xpress SARS-CoV-2/FLU/RSV  testing. Fact Sheet for Patients: PinkCheek.be Fact Sheet for Healthcare Providers: GravelBags.it This test is not yet approved or cleared by the [place] FDA and  has been authorized for detection and/or diagnosis of SARS-CoV-2 by  FDA under an Emergency Use Authorization (EUA). This EUA will remain  in effect (meaning this test can be used) for the duration of the  Covid-19 declaration under Section 564(b)(1) of the Act, 21  U.S.C. section 360bbb-3(b)(1), unless the authorization is  terminated or revoked. Performed at [facility], [place] [address], [place], [place] [address]   Surgical PCR screen     Status: None   Collection Time: 06/19/19  2:18 PM   Specimen: Nasal Mucosa; Nasal Swab  Result Value Ref Range Status   MRSA, PCR NEGATIVE NEGATIVE Final   Staphylococcus aureus NEGATIVE NEGATIVE Final    Comment: (NOTE) The Xpert SA Assay (FDA approved for NASAL specimens in patients  62 years of age and older), is one component of a comprehensive surveillance program. It is not intended to diagnose infection nor to guide or monitor treatment. Performed at [facility], [place] [address], [place], [place] [address]          Radiology Studies: NM Myocar Multi W/Spect Tamela Oddi Motion / EF  Result Date: 06/19/2019 CLINICAL DATA:  Coronary artery disease, diabetes, hypertension. Recent syncope. EXAM: MYOCARDIAL IMAGING WITH SPECT (REST AND PHARMACOLOGIC-STRESS) GATED LEFT VENTRICULAR WALL MOTION STUDY LEFT VENTRICULAR EJECTION FRACTION TECHNIQUE: Standard myocardial SPECT imaging  was performed after resting intravenous injection of 10 mCi Tc-64m tetrofosmin. Subsequently, intravenous infusion of Lexiscan was performed under the supervision of the Cardiology staff. At peak effect of the drug, 30 mCi Tc-66m tetrofosmin was injected intravenously and standard myocardial SPECT imaging was performed. Quantitative gated imaging was also performed to evaluate left ventricular wall motion, and estimate left ventricular ejection fraction. COMPARISON:  Chest radiograph 06/17/2019 FINDINGS: Perfusion: Large matched defect in the inferolateral wall extending from the apex to the base. Small matched anteroseptal defect extending from the apex to the mid heart. No definite inducible ischemia. There is some reverse redistribution along the septal wall and anterior wall, possibly related to prior intervention. Wall Motion: Mild hypokinesis and poor wall thickening along the lateral wall. Dilated left ventricle. Left Ventricular Ejection Fraction: 46 % End diastolic volume 832 ml (prominently elevated) End systolic volume 90 ml (prominently elevated) IMPRESSION: 1. Large scar along the inferolateral wall and smaller scar along the anteroseptal wall. No inducible ischemia is identified. .  Dilated left ventricle. 2. Mild poor wall thickening and hypokinesis along the lateral wall. 3. Left ventricular  ejection fraction 46% 4. Non invasive risk stratification*: High *2012 Appropriate Use Criteria for Coronary Revascularization Focused Update: J Am Coll Cardiol. 9191;66(0):600-459. http://content.[email or web address]?articleid=[email or web address] Electronically Signed   By: Van Clines M.D.   On: 06/19/2019 13:09        Scheduled Meds: . anastrozole  1 mg Oral QHS  . aspirin EC  81 mg Oral Daily  . atorvastatin  40 mg Oral QHS  . azelastine  1 spray Each Nare BID   Or  . fluticasone  1 spray Each Nare BID  . cholecalciferol  2,000 Units Oral Daily  . ferrous sulfate  325 mg Oral Q breakfast  . gentamicin irrigation  80 mg Irrigation On Call  . hydrALAZINE  25 mg Oral Q8H  . insulin aspart  0-15 Units Subcutaneous TID WC  . insulin aspart  0-5 Units Subcutaneous QHS  . isosorbide dinitrate  40 mg Oral BID  . levothyroxine  150 mcg Oral Q0600  . magnesium oxide  400 mg Oral QHS   Continuous Infusions: . sodium chloride    . sodium chloride    .  ceFAZolin (ANCEF) IV       LOS: 3 days    Time spent: 25 minutes    Barb Merino, MD [facility] Pager [phone]

## 2019-06-20 NOTE — Progress Notes (Addendum)
Progress Note  Patient Name: Lauren Walker Date of Encounter: 06/20/2019  Primary Cardiologist: Jenne Campus, MD   Subjective   Feels OK, no CP, SOB  Inpatient Medications    Scheduled Meds: . anastrozole  1 mg Oral QHS  . aspirin EC  81 mg Oral Daily  . atorvastatin  40 mg Oral QHS  . azelastine  1 spray Each Nare BID   Or  . fluticasone  1 spray Each Nare BID  . cholecalciferol  2,000 Units Oral Daily  . ferrous sulfate  325 mg Oral Q breakfast  . gentamicin irrigation  80 mg Irrigation On Call  . hydrALAZINE  25 mg Oral Q8H  . insulin aspart  0-15 Units Subcutaneous TID WC  . insulin aspart  0-5 Units Subcutaneous QHS  . isosorbide dinitrate  40 mg Oral BID  . levothyroxine  150 mcg Oral Q0600  . magnesium oxide  400 mg Oral QHS   Continuous Infusions: . sodium chloride    . sodium chloride    .  ceFAZolin (ANCEF) IV     PRN Meds: acetaminophen **OR** acetaminophen, fluticasone, ondansetron **OR** ondansetron (ZOFRAN) IV   Vital Signs    Vitals:   06/20/19 0028 06/20/19 0521 06/20/19 0633 06/20/19 0814  BP: (!) 130/36 (!) 185/68 (!) 144/48 (!) 156/41  Pulse: 62 70  (!) 59  Resp: 13 (!) _0 Temp: 98.2 F (36.8 C) 98.4 F (36.9 C)  97.8 F (36.6 C)  TempSrc: Oral Oral  Oral  SpO2: 99% 99%  99%  Weight:      Height:        Intake/Output Summary (Last 24 hours) at 06/20/2019 0902 Last data filed at 06/20/2019 0816 Gross per 24 hour  Intake 480 ml  Output --  Net 480 ml   Last 3 Weights 06/18/2019 06/17/2019 06/07/2019  Weight (lbs) 168 lb 4.8 oz 166 lb 166 lb 8 oz  Weight (kg) 76.34 kg 75.297 kg 75.524 kg      Telemetry    SB/SR, PVCs - Personally Reviewed  ECG    No new EKGs - Personally Reviewed  Physical Exam   GEN: No acute distress.   Neck: No JVD Cardiac: RRR, soft SM, rubs, or gallops.  Respiratory: CTA b/l. GI: Soft, nontender, non-distended  MS: No edema; No deformity. Neuro:  Nonfocal  Psych: Normal affect   Labs     High Sensitivity Troponin:   Recent Labs  Lab 05/25/19 1221 05/27/19 0940 05/27/19 1255 06/17/19 1932 06/17/19 2120  TROPONINIHS 576* 235* 254* 35* 311*      Chemistry Recent Labs  Lab 06/17/19 1932 06/17/19 1932 06/17/19 1959 06/17/19 1959 06/17/19 2132 06/18/19 0500 06/19/19 0339  NA 139   < > 138   < > 141 142 141  K 4.2   < > 4.1   < > 3.9 4.1 4.4  CL 101   < > 102  --   --  106 108  CO2 19*  --   --   --   --  26 26  GLUCOSE 316*   < > 297*  --   --  126* 108*  BUN 42*   < > 38*  --   --  41* 40*  CREATININE 2.48*   < > 2.50*  --   --  2.42* 2.30*  CALCIUM 8.6*  --   --   --   --  8.8* 8.6*  PROT 5.5*  --   --   --   --  5.2* 4.6*  ALBUMIN 3.0*  --   --   --   --  2.7* 2.5*  AST 190*  --   --   --   --  93* 49*  ALT 59*  --   --   --   --  51* 39  ALKPHOS 106  --   --   --   --  89 64  BILITOT 0.7  --   --   --   --  0.9 0.6  GFRNONAA 17*  --   --   --   --  17* 18*  GFRAA 19*  --   --   --   --  20* 21*  ANIONGAP 19*  --   --   --   --  10 7   < > = values in this interval not displayed.     Hematology Recent Labs  Lab 06/17/19 1356 06/17/19 1356 06/17/19 1932 06/17/19 1932 06/17/19 1959 06/17/19 2132 06/18/19 0500  WBC 6.7  --  11.2*  --   --   --  5.8  RBC 3.17*  3.20*  --  3.31*  --   --   --  3.08*  HGB 9.6*   < > 10.1*   < > 10.2* 9.9* 9.6*  HCT 30.4*   < > 33.3*   < > 30.0* 29.0* 30.4*  MCV 95.9  --  100.6*  --   --   --  98.7  MCH 30.3  --  30.5  --   --   --  31.2  MCHC 31.6  --  30.3  --   --   --  31.6  RDW 14.1  --  14.1  --   --   --  14.3  PLT 237  --  326  --   --   --  206   < > = values in this interval not displayed.    BNPNo results for input(s): BNP, PROBNP in the last 168 hours.   DDimer No results for input(s): DDIMER in the last 168 hours.   Radiology      Cardiac Studies   NM Myocar Multi W/Spect W/Wall Motion / EF Result Date: 06/19/2019 CLINICAL DATA:  Coronary artery disease, diabetes, hypertension. Recent  syncope. EXAM: MYOCARDIAL IMAGING WITH SPECT (REST AND PHARMACOLOGIC-STRESS) GATED LEFT VENTRICULAR WALL MOTION STUDY LEFT VENTRICULAR EJECTION FRACTION TECHNIQUE: Standard myocardial SPECT imaging was performed after resting intravenous injection of 10 mCi Tc-17mtetrofosmin. Subsequently, intravenous infusion of Lexiscan was performed under the supervision of the Cardiology staff. At peak effect of the drug, 30 mCi Tc-936metrofosmin was injected intravenously and standard myocardial SPECT imaging was performed. Quantitative gated imaging was also performed to evaluate left ventricular wall motion, and estimate left ventricular ejection fraction. COMPARISON:  Chest radiograph 06/17/2019 FINDINGS: Perfusion: Large matched defect in the inferolateral wall extending from the apex to the base. Small matched anteroseptal defect extending from the apex to the mid heart. No definite inducible ischemia. There is some reverse redistribution along the septal wall and anterior wall, possibly related to prior intervention. Wall Motion: Mild hypokinesis and poor wall thickening along the lateral wall. Dilated left ventricle. Left Ventricular Ejection Fraction: 46 % End diastolic volume 16003l (prominently elevated) End systolic volume 90 ml (prominently elevated) IMPRESSION: 1. Large scar along the inferolateral wall and smaller scar along the anteroseptal wall. No inducible ischemia is identified. .  Dilated left ventricle. 2. Mild poor wall thickening  and hypokinesis along the lateral wall. 3. Left ventricular ejection fraction 46% 4. Non invasive risk stratification*: High *2012 Appropriate Use Criteria for Coronary Revascularization Focused Update: J Am Coll Cardiol. 7673;41(9):379-024. http://[email or web address]?articleid=[email or web address] Electronically Signed   By: Van Clines M.D.   On: 06/19/2019 13:09     05/25/2019: TTE IMPRESSIONS  1. Left ventricular ejection fraction, by estimation, is 45 to  50%. The  left ventricle has mildly decreased function. The left ventricle  demonstrates regional wall motion abnormalities (see scoring  diagram/findings for description). There is moderate  left ventricular hypertrophy. Left ventricular diastolic parameters are  indeterminate. There is moderate hypokinesis of the left ventricular,  entire inferior wall.  2. Right ventricular systolic function is normal. The right ventricular  size is normal. There is normal pulmonary artery systolic pressure.  3. Left atrial size was severely dilated.  4. Large pleural effusion in the left lateral region.  5. The mitral valve is grossly normal. Moderate to severe mitral valve  regurgitation.  6. The aortic valve is tricuspid. Aortic valve regurgitation is not  visualized. Mild to moderate aortic valve stenosis. Aortic valve area, by  VTI measures 1.49 cm. Aortic valve mean gradient measures 10.4 mmHg.  Aortic valve Vmax measures 2.17 m/s.  7. The inferior vena cava is dilated in size with >50% respiratory  variability, suggesting right atrial pressure of 8 mmHg.   Patient Profile     86 y.o. female w/PMHx of breast cancer (stage 1A invasive ductal CA  E+/P+/HER-2 neg) ,HFpEF,remote CAD(cath 2016 -RCA STEMI 12/2014, managed medically),paroxysmal atrial fibrillation,severe mitral regurgitation,mild AS,CKD (IV),type 2 diabetes mellitus,hypertension, hyperlipidemia, and hypothyroidism.    She was recently admitted to [facility] with complaints of SOB and LE edema.  She was dx with acute CHF and started on IV diuretics.  hsTrop and BNp were elevated.  2D echo showed mildly reduced LVF with EF 45% with moderate to severe MR.  During that admission she had PAF with post-termination pauses up to 6 seconds and presyncope. She was seen by EP and recommended a conservative approach given her advanced age.  Plan was to maintain NSR with Amio 289m BID and was on Eliquis for CHADS2VASC socre of 7 It was felt  that PAF was due to severe MR.  It was decided not to pursue cath due to advanced age and CKD.  It was also decided to refer to structural heart after that hospital admit to see if she would be a candidate for MitraClip.  She was discharged hom on 4/13 on Torsemide 250mdaily.     She had a syncopal event at home EMS was called and initially HR was in the 200's with wide complex tachycardia felt to be VT. She was given 15064mV Amio and cardioverted with 100J to NSR.  [facility] HR was in the 40-50's.  Labs showed K+ 4.1 and Mag 2.4.  Creatinine 2.5 and Hbg 10.2. EKG showed junctional bradycardia at 43bpm with severe global ischemic ST changes and markedly prolonged QTc at 576m3mand anterior infarct and minimal ST elevated in aVR  She was admitted, her amiodarone stopped with marked QT prolongation and bradycardia  Assessment & Plan    1. Presyncope/VT     MMVT 2. Prolonged qt     Suspect 2/2 amiodarone     Still looks a little long on tele     No VT last 24hours 3. Bradycardiaknown recent h/o post termination pauses     She has had  some intermittent brief junctional rates high 20's-30's early AM hours   Device implant is planned today.  PPM vs ICD. In d/w the patient, she feels most comfortable after her discussion with Dr. Caryl Comes to pursue ICD implant Dr. Lovena Le will discuss further with her today  I have discussed implant procedure with the patient, potential risks.  She is agreeable to proceed pending her d/w Dr. Lovena Le  Will minimize contrast given her CKD  Orders to follow   4. Paroxysmal Atrial Fibrillation     amio stopped     Held eliquis for procedure today  5. CAD - s/p STEMI in 2016 and medically managed.  - continue ASA (was on this along with Eliquis prior to admission), Imdur and statin.   Stress test with scar, no ischemia  6. Chronic Combined Systolic and Diastolic CHF - EF 83-41% by echo in 05/2019 Remains on Imdur and Hydralazine.  Appears  euvolemic by examination.  Quinapril and Torsemide held given AKI.  Creat trending down 2.3 today Baseline looks about 2.0-2.2  7. Severe Mitral Regurgitation By review of notes, she has been referred to the Structural Heart Team for consideration of MitraClip.   8. Accelerated HTN - BP has been significantly elevated since admission, Quinapril held given AKI. She has been started on PO Hydralazine 100m Q8H  Looks better   For questions or updates, please contact [place]Please consult [email or web address] for contact info under   Signed, RBaldwin Jamaica PA-C  06/20/2019, 9:02 AM    EP attending  Patient seen and examined.  Agree with the findings as noted above.  The patient has sinus node dysfunction, and sustained monomorphic ventricular tachycardia in the setting of an old inferior myocardial infarction.  She had syncope associated with her ventricular tachycardia.  The patient had been on amiodarone.  I discussed the treatment options with the patient and her husband in detail.  The risks, goals, benefits, and expectations of dual-chamber ICD insertion were reviewed and she wishes to proceed.  GCristopher Peru MD

## 2019-06-20 NOTE — Progress Notes (Signed)
Obtained the consent form and placed in pt's chart. Handed pt's husband pt's belongings including a silver necklace and 2 bracelets.   Lavenia Atlas, RN

## 2019-06-20 NOTE — Interval H&P Note (Signed)
History and Physical Interval Note:  06/20/2019 3:07 PM  Lauren Walker  has presented today for surgery, with the diagnosis of VT.  The various methods of treatment have been discussed with the patient and family. After consideration of risks, benefits and other options for treatment, the patient has consented to  Procedure(s): ICD IMPLANT (N/A) as a surgical intervention.  The patient's history has been reviewed, patient examined, no change in status, stable for surgery.  I have reviewed the patient's chart and labs.  Questions were answered to the patient's satisfaction.     Cristopher Peru

## 2019-06-20 NOTE — Progress Notes (Signed)
Orthopedic Tech Progress Note Patient Details:  Lauren Walker Dec 02, 1929 [id number]  Ortho Devices Type of Ortho Device: Arm sling Ortho Device/Splint Location: RUE Ortho Device/Splint Interventions: Ordered, Application   Post Interventions Patient Tolerated: Well Instructions Provided: [facility] [place] 06/20/2019, 6:06 PM

## 2019-06-21 ENCOUNTER — Inpatient Hospital Stay (HOSPITAL_COMMUNITY): Payer: Medicare Other

## 2019-06-21 ENCOUNTER — Ambulatory Visit: Payer: Medicare Other | Admitting: Podiatry

## 2019-06-21 DIAGNOSIS — I472 Ventricular tachycardia: Secondary | ICD-10-CM | POA: Diagnosis not present

## 2019-06-21 LAB — BASIC METABOLIC PANEL
Anion gap: 8 (ref 5–15)
BUN: 38 mg/dL — ABNORMAL HIGH (ref 8–23)
CO2: 24 mmol/L (ref 22–32)
Calcium: 8.6 mg/dL — ABNORMAL LOW (ref 8.9–10.3)
Chloride: 106 mmol/L (ref 98–111)
Creatinine, Ser: 2.17 mg/dL — ABNORMAL HIGH (ref 0.44–1.00)
GFR calc Af Amer: 23 mL/min — ABNORMAL LOW (ref >60)
GFR calc non Af Amer: 20 mL/min — ABNORMAL LOW (ref >60)
Glucose, Bld: 113 mg/dL — ABNORMAL HIGH (ref 70–99)
Potassium: 4.3 mmol/L (ref 3.5–5.1)
Sodium: 138 mmol/L (ref 135–145)

## 2019-06-21 LAB — GLUCOSE, CAPILLARY
Glucose-Capillary: 109 mg/dL — ABNORMAL HIGH (ref 70–99)
Glucose-Capillary: 185 mg/dL — ABNORMAL HIGH (ref 70–99)

## 2019-06-21 MED ORDER — ISOSORBIDE DINITRATE 40 MG PO TABS: 40.0000 mg | ORAL_TABLET | Freq: Two times a day (BID) | ORAL | 0 refills | Status: DC

## 2019-06-21 MED ORDER — CARVEDILOL 3.125 MG PO TABS: 3.1250 mg | ORAL_TABLET | Freq: Two times a day (BID) | ORAL | 0 refills | Status: DC

## 2019-06-21 MED ORDER — CARVEDILOL 3.125 MG PO TABS
3.1250 mg | ORAL_TABLET | Freq: Two times a day (BID) | ORAL | Status: DC
  Administered 2019-06-21: 10:00:00 3.125 mg via ORAL

## 2019-06-21 MED FILL — Gentamicin Sulfate Inj 40 MG/ML: INTRAMUSCULAR | Qty: 80 | Status: AC

## 2019-06-21 NOTE — Progress Notes (Addendum)
Progress Note  Patient Name: Lauren Walker Date of Encounter: 06/21/2019  Primary Cardiologist: Jenne Campus, MD   Subjective   Feels OK, no CP, SOB, denies pain at implant site  Inpatient Medications    Scheduled Meds:  anastrozole  1 mg Oral QHS   aspirin EC  81 mg Oral Daily   atorvastatin  40 mg Oral QHS   azelastine  1 spray Each Nare BID   Or   fluticasone  1 spray Each Nare BID   cholecalciferol  2,000 Units Oral Daily   ferrous sulfate  325 mg Oral Q breakfast   hydrALAZINE  25 mg Oral Q8H   insulin aspart  0-15 Units Subcutaneous TID WC   insulin aspart  0-5 Units Subcutaneous QHS   isosorbide dinitrate  40 mg Oral BID   levothyroxine  150 mcg Oral Q0600   magnesium oxide  400 mg Oral QHS   Continuous Infusions:   ceFAZolin (ANCEF) IV 1 g (06/21/19 0940)   PRN Meds: acetaminophen **OR** acetaminophen, acetaminophen, fluticasone, ondansetron **OR** ondansetron (ZOFRAN) IV, ondansetron (ZOFRAN) IV   Vital Signs    Vitals:   06/21/19 0025 06/21/19 0448 06/21/19 0805 06/21/19 0942  BP: (!) 120/43 (!) 169/59 (!) 143/59 (!) 137/44  Pulse: 60 62 84   Resp: '18 17 20   ' Temp: 98 F (36.7 C) 98.3 F (36.8 C) 97.6 F (36.4 C)   TempSrc: Oral Oral Oral   SpO2: 94% 97% 95%   Weight:      Height:        Intake/Output Summary (Last 24 hours) at 06/21/2019 0946 Last data filed at 06/21/2019 0912 Gross per 24 hour  Intake 540 ml  Output --  Net 540 ml   Last 3 Weights 06/18/2019 06/17/2019 06/07/2019  Weight (lbs) 168 lb 4.8 oz 166 lb 166 lb 8 oz  Weight (kg) 76.34 kg 75.297 kg 75.524 kg      Telemetry    Predominantly AP/VS, some AV pacing - Personally Reviewed  ECG    SR 64bpm, narrow QRS, QT 456, QTc 429m - Personally Reviewed  Physical Exam   GEN: No acute distress.   Neck: No JVD Cardiac: RRR, soft SM, rubs, or gallops.  Respiratory: CTA b/l. GI: Soft, nontender, non-distended  MS: No edema; No deformity. Neuro:  Nonfocal  Psych: Normal  affect   R chest: ICD site is stable, no bleeding or hematoma, scattered area of ecchymosis  Labs    High Sensitivity Troponin:   Recent Labs  Lab 05/25/19 1221 05/27/19 0940 05/27/19 1255 06/17/19 1932 06/17/19 2120  TROPONINIHS 576* 235* 254* 35* 311*      Chemistry Recent Labs  Lab 06/17/19 1932 06/17/19 1959 06/18/19 0500 06/19/19 0339 06/21/19 0547  NA 139   < > 142 141 138  K 4.2   < > 4.1 4.4 4.3  CL 101   < > 106 108 106  CO2 19*   < > '26 26 24  ' GLUCOSE 316*   < > 126* 108* 113*  BUN 42*   < > 41* 40* 38*  CREATININE 2.48*   < > 2.42* 2.30* 2.17*  CALCIUM 8.6*   < > 8.8* 8.6* 8.6*  PROT 5.5*  --  5.2* 4.6*  --   ALBUMIN 3.0*  --  2.7* 2.5*  --   AST 190*  --  93* 49*  --   ALT 59*  --  51* 39  --   ALKPHOS 106  --  89 64  --   BILITOT 0.7  --  0.9 0.6  --   GFRNONAA 17*   < > 17* 18* 20*  GFRAA 19*   < > 20* 21* 23*  ANIONGAP 19*   < > '10 7 8   ' < > = values in this interval not displayed.     Hematology Recent Labs  Lab 06/17/19 1356 06/17/19 1356 06/17/19 1932 06/17/19 1932 06/17/19 1959 06/17/19 2132 06/18/19 0500  WBC 6.7  --  11.2*  --   --   --  5.8  RBC 3.17*  3.20*  --  3.31*  --   --   --  3.08*  HGB 9.6*   < > 10.1*   < > 10.2* 9.9* 9.6*  HCT 30.4*   < > 33.3*   < > 30.0* 29.0* 30.4*  MCV 95.9  --  100.6*  --   --   --  98.7  MCH 30.3  --  30.5  --   --   --  31.2  MCHC 31.6  --  30.3  --   --   --  31.6  RDW 14.1  --  14.1  --   --   --  14.3  PLT 237  --  326  --   --   --  206   < > = values in this interval not displayed.    BNPNo results for input(s): BNP, PROBNP in the last 168 hours.   DDimer No results for input(s): DDIMER in the last 168 hours.   Radiology      Cardiac Studies   NM Myocar Multi W/Spect W/Wall Motion / EF Result Date: 06/19/2019 CLINICAL DATA:  Coronary artery disease, diabetes, hypertension. Recent syncope. EXAM: MYOCARDIAL IMAGING WITH SPECT (REST AND PHARMACOLOGIC-STRESS) GATED LEFT VENTRICULAR  WALL MOTION STUDY LEFT VENTRICULAR EJECTION FRACTION TECHNIQUE: Standard myocardial SPECT imaging was performed after resting intravenous injection of 10 mCi Tc-28mtetrofosmin. Subsequently, intravenous infusion of Lexiscan was performed under the supervision of the Cardiology staff. At peak effect of the drug, 30 mCi Tc-978metrofosmin was injected intravenously and standard myocardial SPECT imaging was performed. Quantitative gated imaging was also performed to evaluate left ventricular wall motion, and estimate left ventricular ejection fraction. COMPARISON:  Chest radiograph 06/17/2019 FINDINGS: Perfusion: Large matched defect in the inferolateral wall extending from the apex to the base. Small matched anteroseptal defect extending from the apex to the mid heart. No definite inducible ischemia. There is some reverse redistribution along the septal wall and anterior wall, possibly related to prior intervention. Wall Motion: Mild hypokinesis and poor wall thickening along the lateral wall. Dilated left ventricle. Left Ventricular Ejection Fraction: 46 % End diastolic volume 16751l (prominently elevated) End systolic volume 90 ml (prominently elevated) IMPRESSION: 1. Large scar along the inferolateral wall and smaller scar along the anteroseptal wall. No inducible ischemia is identified. .  Dilated left ventricle. 2. Mild poor wall thickening and hypokinesis along the lateral wall. 3. Left ventricular ejection fraction 46% 4. Non invasive risk stratification*: High *2012 Appropriate Use Criteria for Coronary Revascularization Focused Update: J Am Coll Cardiol. 207001;74(9):449-675http://[email or web address]?articleid=[email or web address] Electronically Signed   By: WaVan Clines.D.   On: 06/19/2019 13:09     05/25/2019: TTE IMPRESSIONS   1. Left ventricular ejection fraction, by estimation, is 45 to 50%. The  left ventricle has mildly decreased function. The left ventricle  demonstrates regional  wall motion abnormalities (see scoring  diagram/findings for description). There  is moderate  left ventricular hypertrophy. Left ventricular diastolic parameters are  indeterminate. There is moderate hypokinesis of the left ventricular,  entire inferior wall.   2. Right ventricular systolic function is normal. The right ventricular  size is normal. There is normal pulmonary artery systolic pressure.   3. Left atrial size was severely dilated.   4. Large pleural effusion in the left lateral region.   5. The mitral valve is grossly normal. Moderate to severe mitral valve  regurgitation.   6. The aortic valve is tricuspid. Aortic valve regurgitation is not  visualized. Mild to moderate aortic valve stenosis. Aortic valve area, by  VTI measures 1.49 cm. Aortic valve mean gradient measures 10.4 mmHg.  Aortic valve Vmax measures 2.17 m/s.   7. The inferior vena cava is dilated in size with >50% respiratory  variability, suggesting right atrial pressure of 8 mmHg.   Patient Profile     86 y.o. female w/PMHx of breast cancer (stage 1A invasive ductal CA  E+/P+/HER-2 neg) , HFpEF, remote CAD (cath 2016 -RCA STEMI 12/2014, managed medically), paroxysmal atrial fibrillation, severe mitral regurgitation, mild AS, CKD (IV), type 2 diabetes mellitus, hypertension, hyperlipidemia, and hypothyroidism.    She was recently admitted to [facility] with complaints of SOB and LE edema.  She was dx with acute CHF and started on IV diuretics.  hsTrop and BNp were elevated.  2D echo showed mildly reduced LVF with EF 45% with moderate to severe MR.  During that admission she had PAF with post-termination pauses up to 6 seconds and presyncope. She was seen by EP and recommended a conservative approach given her advanced age.  Plan was to maintain NSR with Amio 256m BID and was on Eliquis for CHADS2VASC socre of 7 It was felt that PAF was due to severe MR.  It was decided not to pursue cath due to advanced age and CKD.  It  was also decided to refer to structural heart after that hospital admit to see if she would be a candidate for MitraClip.  She was discharged hom on 4/13 on Torsemide 253mdaily.     She had a syncopal event at home EMS was called and initially HR was in the 200's with wide complex tachycardia felt to be VT. She was given 15067mV Amio and cardioverted with 100J to NSR.  [facility] HR was in the 40-50's.  Labs showed K+ 4.1 and Mag 2.4.  Creatinine 2.5 and Hbg 10.2. EKG showed junctional bradycardia at 43bpm with severe global ischemic ST changes and markedly prolonged QTc at 576m75mand anterior infarct and minimal ST elevated in aVR  She was admitted, her amiodarone stopped with marked QT prolongation and bradycardia  Assessment & Plan    1. Presyncope/VT     MMVT 2. Prolonged qt     Suspect 2/2 amiodarone     QT is better     No VT 3. Bradycardia known recent h/o post termination pauses     She has had some intermittent brief junctional rates high 20's-30's early AM hours       No brady since device implant   S/p dual chamber ICD implanted yesterday Site is stable Wound care and activity instructions reviewed with the patient routine follow up is in place No driving 6 months, or until cleared to by MD, patient is aware Device check this AM with good measurements CXR with stable led placement and no PTX  4. Paroxysmal Atrial Fibrillation  NO amiodarone going forward     Do NOT resume Eliquis until [facility] 06/27/2019  5. CAD - s/p STEMI in 2016 and medically managed.  - continue ASA (was on this along with Eliquis prior to admission), Imdur and statin.   Stress test with scar, no ischemia   no CP   Add coreg  6. Chronic Combined Systolic and Diastolic CHF - EF 78-93% by echo in 05/2019 Remains on Imdur and Hydralazine.  Appears euvolemic by examination.  Quinapril and Torsemide held given AKI.  Creat continues to trending down 2.1 today Baseline looks about  2.0-2.2  Resume her ACE/diuretic when medicine feels Creat stable enough Looks at her baseline Cardiology follow up is in place with her attending cardiologist    7. Severe Mitral Regurgitation By review of notes, she has been referred to the Structural Heart Team for consideration of MitraClip.    8.  HTN     Start coreg      OK from our perspective for her home ACE/diuretic when OK from medicine perspective  Dr. Lovena Le has seen and examined the patient this AM OK to discharge from our persoective   For questions or updates, please contact [facility] Please consult [email or web address] for contact info under   Signed, Baldwin Jamaica, PA-C  06/21/2019, 9:46 AM    EP Attending  Patient seen and examined. Agree with above. The patient has undergone insertion of a DDD ICD. She feels well and her CXR demonstrates normal lead position, no PTX and under my direction normally functioning DDD ICD. She will be discharged home with usual followup.  Mikle Bosworth.D

## 2019-06-21 NOTE — Discharge Instructions (Signed)
    Supplemental Discharge Instructions for  Pacemaker/Defibrillator Patients  Activity No heavy lifting or vigorous activity with your left/right arm for 6 to 8 weeks.  Do not raise your left/right arm above your head for one week.  Gradually raise your affected arm as drawn below.             06/24/2019                   06/25/2019                  06/26/2019                 06/27/2019 __  NO DRIVING for 101months, or until cleared to by your doctor  WOUND CARE - Keep the wound area clean and dry.  Do not get this area wet for one week. No showers for one week; you may shower on  07/28/2019   . - The tape/steri-strips on your wound will fall off; do not pull them off.  No bandage is needed on the site.  DO  NOT apply any creams, oils, or ointments to the wound area. - If you notice any drainage or discharge from the wound, any swelling or bruising at the site, or you develop a fever > 101? F after you are discharged home, call the office at once.  Special Instructions - You are still able to use cellular telephones; use the ear opposite the side where you have your pacemaker/defibrillator.  Avoid carrying your cellular phone near your device. - When traveling through airports, show security personnel your identification card to avoid being screened in the metal detectors.  Ask the security personnel to use the hand wand. - Avoid arc welding equipment, MRI testing (magnetic resonance imaging), TENS units (transcutaneous nerve stimulators).  Call the office for questions about other devices. - Avoid electrical appliances that are in poor condition or are not properly grounded. - Microwave ovens are safe to be near or to operate.  Additional information for defibrillator patients should your device go off: - If your device goes off ONCE and you feel fine afterward, notify the device clinic nurses. - If your device goes off ONCE and you do not feel well afterward, call 911. - If your device goes off  TWICE, call 911. - If your device goes off THREE times in one day, call 911.  DO NOT DRIVE YOURSELF OR A FAMILY MEMBER WITH A DEFIBRILLATOR TO THE HOSPITAL--CALL 911.

## 2019-06-21 NOTE — Progress Notes (Signed)
D/c tele and IV. Went over AVS with pt and daughter and all questions were answered. Sending pt home with AVS and pt's belongings with her flonase med.   Lavenia Atlas, RN

## 2019-06-21 NOTE — Discharge Summary (Signed)
Physician Discharge Summary  Lauren Walker:[id number] DOB: 11-Mar-1929 DOA: 06/17/2019  PCP: Midge Minium, MD  Admit date: 06/17/2019 Discharge date: 06/21/2019  Admitted From: Home Disposition: Home  Recommendations for Outpatient Follow-up:  1. Follow up with PCP in 1-2 weeks 2. Please obtain BMP/CBC in one week 3. Follow-up with cardiology as a scheduled  Discharge Condition: Stable CODE STATUS: Partial Diet recommendation: Low-salt diet.  Discharge summary: 86 year old female with history of breast cancer on anastrozole, chronic diastolic heart failure, remote coronary artery disease, paroxysmal A. fib, severe mitral regurgitation, CKD stage IV with baseline creatinine about 2-2.2, hypertension hyperlipidemia and hypothyroidism, diet-controlled diabetes who was recently admitted to the hospital with diastolic dysfunction and severe mitral regurgitation along with syncopal episode presents back to the ED syncopal episode.  EMS found with ventricular tachycardia and heart rate more than 200 initially not relieved by amiodarone bolus, she was given emergent DC cardioversion with 100 J and converted to sinus rhythm with resulting bradycardia. In the emergency room she remained in sinus rhythm and hemodynamically stable.  Mental status improved.  Patient was also found with QT prolongation more than 500.  # Presyncope/syncope with ventricular tachycardia/prolonged QT interval on amiodarone: Patient was stabilized.  Electrolytes were replaced.  Continue to have sinus pauses. Underwent dual-chamber pacemaker placement on 5/3 with cardiology. Post procedure monitored in the hospital, without events, interrogation of the device is stable, chest x-ray stable without complications. Telemetry with paced rhythm and regular.  Plan: Discharging home.  Right hand precautions and wound care instructions provided separately. Blood pressures were elevated, resuming home ACE inhibitor, added  Coreg, cardiology recommended changing nitrates to twice a day.  Resume torsemide.  Outpatient follow-up with cardiology. Resume aspirin, resume Eliquis in 1 week, resuming date 5/10 and instructions provided. Creatinine is 2.1 at about her baseline.  She is euvolemic on clinical exam.  TSH 1.7, remains on thyroxine replacement.  Discharge Diagnoses:  Principal Problem:   V-tach [facility]) Active Problems:   Type 2 diabetes mellitus with other circulatory complications (HCC)   HTN (hypertension)   CAD (coronary artery disease)   Paroxysmal atrial fibrillation (HCC)   Mitral regurgitation moderate to severe based on echocardiogram in summer 2020   CKD stage G4/A1, GFR 15-29 and albumin creatinine ratio <30 mg/g (HCC)   Sinus bradycardia   Transaminitis    Discharge Instructions  Discharge Instructions    Call MD for:  redness, tenderness, or signs of infection (pain, swelling, redness, odor or green/yellow discharge around incision site)   Complete by: As directed    Call MD for:  severe uncontrolled pain   Complete by: As directed    Call MD for:  temperature >100.4   Complete by: As directed    Diet - low sodium heart healthy   Complete by: As directed    Discharge instructions   Complete by: As directed    Resume Eliquis only on 06/27/2019 Wound care and protection as discussed and instructions   Increase activity slowly   Complete by: As directed      Allergies as of 06/21/2019      Reactions   Amiodarone Other (See Comments)   Prolonged QT, VT   Tape Itching, Rash, Other (See Comments)   Reaction was from the surgical tape from cyst removed      Medication List    STOP taking these medications   amiodarone 200 MG tablet Commonly known as: PACERONE   isosorbide mononitrate 30 MG 24 hr tablet  Commonly known as: IMDUR   isosorbide mononitrate 60 MG 24 hr tablet Commonly known as: IMDUR     TAKE these medications   acetaminophen 650 MG CR tablet Commonly known as:  TYLENOL Take 650 mg by mouth every 8 (eight) hours as needed for pain.   anastrozole 1 MG tablet Commonly known as: ARIMIDEX TAKE 1 TABLET BY MOUTH EVERY DAY What changed: when to take this   apixaban 2.5 MG Tabs tablet Commonly known as: Eliquis Take 1 tablet (2.5 mg total) by mouth 2 (two) times daily.   aspirin EC 81 MG tablet Take 81 mg by mouth daily.   atorvastatin 40 MG tablet Commonly known as: LIPITOR Take 1 tablet (40 mg total) by mouth daily. What changed: when to take this   Azelastine-Fluticasone 137-50 MCG/ACT Susp Place 1 spray into both nostrils 2 (two) times daily.   b complex vitamins capsule Take 1 capsule by mouth daily.   Biotin 1 MG Caps Take 1 mg by mouth daily.   carvedilol 3.125 MG tablet Commonly known as: COREG Take 1 tablet (3.125 mg total) by mouth 2 (two) times daily with a meal.   fluticasone 50 MCG/ACT nasal spray Commonly known as: FLONASE SPRAY 2 SPRAYS INTO EACH NOSTRIL EVERY DAY What changed: See the new instructions.   Iron 325 (65 Fe) MG Tabs Take 325 mg by mouth daily with breakfast.   isosorbide dinitrate 40 MG tablet Commonly known as: ISORDIL Take 1 tablet (40 mg total) by mouth 2 (two) times daily.   levothyroxine 150 MCG tablet Commonly known as: SYNTHROID Take 1 tablet (150 mcg total) by mouth daily before breakfast.   magnesium oxide 400 MG tablet Commonly known as: MAG-OX Take 400 mg by mouth at bedtime.   nitroGLYCERIN 0.4 MG SL tablet Commonly known as: NITROSTAT Place 0.4 mg under the tongue every 5 (five) minutes as needed for chest pain.   quinapril 20 MG tablet Commonly known as: ACCUPRIL Take 10 mg by mouth in the morning and at bedtime.   torsemide 20 MG tablet Commonly known as: DEMADEX Take 1 tablet (20 mg total) by mouth daily.   vitamin C 500 MG tablet Commonly known as: ASCORBIC ACID Take 500 mg by mouth daily.   Vitamin D3 50 MCG (2000 UT) Tabs Take 2,000 Units by mouth at bedtime.       Follow-up Information    [facility] Follow up.   Specialty: Cardiology Why: 06/30/2019 @ 3:00PM, wound check visit Contact information: [address] [place]       Park Liter, MD Follow up.   Specialty: Cardiology Why: 07/06/2019 @ 1:55PM Contact information: [place] [address] [phone]        Evans Lance, MD Follow up.   Specialty: Cardiology Why: 09/23/2019 @ 2:15PM Contact information: [address] [place]  [address] [phone]          Allergies  Allergen Reactions  . Amiodarone Other (See Comments)    Prolonged QT, VT  . Tape Itching, Rash and Other (See Comments)    Reaction was from the surgical tape from cyst removed    Consultations:  Cardiology   Procedures/Studies: DG Chest 1 View  Result Date: 05/27/2019 CLINICAL DATA:  Status post left thoracentesis EXAM: CHEST  1 VIEW COMPARISON:  05/24/2019 FINDINGS: Interval thoracentesis has been performed on the left with near complete reduction of the left-sided pleural  effusion. No pneumothorax is noted. No focal infiltrate is seen. Cardiac shadow is stable. Aortic calcifications are again noted. IMPRESSION: Resolution of left pleural effusion.  No pneumothorax is noted. Electronically Signed   By: Inez Catalina M.D.   On: 05/27/2019 13:48   DG Chest 2 View  Result Date: 06/21/2019 CLINICAL DATA:  Pacemaker placement EXAM: CHEST - 2 VIEW COMPARISON:  June 17, 2019 FINDINGS: Pacemaker leads are attached to the right atrium and right ventricle. No pneumothorax. There is cardiomegaly with pulmonary vascularity normal. There is apparent atelectasis in the posterior left base. Lungs elsewhere clear. No adenopathy. There is aortic atherosclerosis. No bone lesions. IMPRESSION: Pacemaker lead tips attached to right atrium and right ventricle. No pneumothorax. There is cardiomegaly. There is  left base atelectasis. Lungs elsewhere clear. Aortic Atherosclerosis (ICD10-I70.0). Electronically Signed   By: Lowella Grip III M.D.   On: 06/21/2019 07:57   DG Chest 2 View  Result Date: 05/24/2019 CLINICAL DATA:  Shortness of breath. EXAM: CHEST - 2 VIEW COMPARISON:  None. FINDINGS: Mild cardiomegaly is noted. Atherosclerosis of thoracic aorta is noted. No pneumothorax is noted. Right lung is clear. Moderate left pleural effusion is noted with probable underlying atelectasis or infiltrate. Bony thorax is unremarkable. IMPRESSION: Moderate left pleural effusion with probable underlying atelectasis or infiltrate. Aortic Atherosclerosis (ICD10-I70.0). Electronically Signed   By: Marijo Conception M.D.   On: 05/24/2019 20:55   EP PPM/ICD IMPLANT  Result Date: 06/20/2019  CONCLUSIONS:  1. Ischemic cardiomyopathy with chronic New [place] Heart Association class II heart failure and sustained VT with syncope.  2. Successful ICD implantation.  3. No early apparent complications. ICD Criteria Current LVEF:50%. Within 12 months prior to implant: Yes Heart failure history: Yes, Class II Cardiomyopathy history: Yes, Ischemic Cardiomyopathy - Prior MI. Atrial Fibrillation/Atrial Flutter: Yes, Paroxysmal. Ventricular tachycardia history: Yes, Hemodynamic instability present. VT Type: Sustained Ventricular Tachycardia - Monomorphic. Cardiac arrest history: No. History of syndromes with risk of sudden death: No. Previous ICD: No. Current ICD indication: Secondary PPM indication: No.  Class I or II Bradycardia indication present: Yes Beta Blocker therapy for 3 or more months: Yes, prescribed. Ace Inhibitor/ARB therapy for 3 or more months: Yes, prescribed. Cristopher Peru, MD 4:19 PM 06/20/2019   NM Myocar Multi W/Spect Tamela Oddi Motion / EF  Result Date: 06/19/2019 CLINICAL DATA:  Coronary artery disease, diabetes, hypertension. Recent syncope. EXAM: MYOCARDIAL IMAGING WITH SPECT (REST AND PHARMACOLOGIC-STRESS) GATED LEFT  VENTRICULAR WALL MOTION STUDY LEFT VENTRICULAR EJECTION FRACTION TECHNIQUE: Standard myocardial SPECT imaging was performed after resting intravenous injection of 10 mCi Tc-48m tetrofosmin. Subsequently, intravenous infusion of Lexiscan was performed under the supervision of the Cardiology staff. At peak effect of the drug, 30 mCi Tc-36m tetrofosmin was injected intravenously and standard myocardial SPECT imaging was performed. Quantitative gated imaging was also performed to evaluate left ventricular wall motion, and estimate left ventricular ejection fraction. COMPARISON:  Chest radiograph 06/17/2019 FINDINGS: Perfusion: Large matched defect in the inferolateral wall extending from the apex to the base. Small matched anteroseptal defect extending from the apex to the mid heart. No definite inducible ischemia. There is some reverse redistribution along the septal wall and anterior wall, possibly related to prior intervention. Wall Motion: Mild hypokinesis and poor wall thickening along the lateral wall. Dilated left ventricle. Left Ventricular Ejection Fraction: 46 % End diastolic volume 099 ml (prominently elevated) End systolic volume 90 ml (prominently elevated) IMPRESSION: 1. Large scar along the inferolateral wall and smaller scar along the anteroseptal wall.  No inducible ischemia is identified. .  Dilated left ventricle. 2. Mild poor wall thickening and hypokinesis along the lateral wall. 3. Left ventricular ejection fraction 46% 4. Non invasive risk stratification*: High *2012 Appropriate Use Criteria for Coronary Revascularization Focused Update: J Am Coll Cardiol. 3716;96(7):893-810. http://content.airportbarriers.com.aspx?articleid=[email or web address] Electronically Signed   By: Van Clines M.D.   On: 06/19/2019 13:09   DG Chest Portable 1 View  Result Date: 06/17/2019 CLINICAL DATA:  Dizziness for 1 hour EXAM: PORTABLE CHEST 1 VIEW COMPARISON:  05/29/2019 FINDINGS: Cardiac shadow is stable. Aortic  calcifications are again seen. Increasing left-sided effusion and likely underlying infiltrate is present. The right lung is clear. No bony abnormality is noted. IMPRESSION: Increasing left-sided effusion and likely underlying infiltrate. Electronically Signed   By: Inez Catalina M.D.   On: 06/17/2019 20:20   DG Chest Port 1 View  Result Date: 05/29/2019 CLINICAL DATA:  Pleural effusion.  Shortness of breath. EXAM: PORTABLE CHEST 1 VIEW COMPARISON:  05/27/2019 FINDINGS: Lungs are adequately inflated without focal airspace consolidation or effusion. Cardiomediastinal silhouette, bones and soft tissues are unchanged. IMPRESSION: No acute disease. Electronically Signed   By: Marin Olp M.D.   On: 05/29/2019 15:27   ECHOCARDIOGRAM COMPLETE  Result Date: 05/25/2019    ECHOCARDIOGRAM REPORT   Patient Name:   Lauren Walker Date of Exam: 05/25/2019 Medical Rec #:  [id number]      Height:       64.0 in Accession #:    [id number]     Weight:       175.4 lb Date of Birth:  1929-04-18      BSA:          1.850 m Patient Age:    86 years       BP:           165/58 mmHg Patient Gender: F              HR:           59 bpm. Exam Location:  Inpatient Procedure: 2D Echo Indications:    Congestive Heart Failure I50.31  History:        Patient has prior history of Echocardiogram examinations, most                 recent 02/16/2019.  Sonographer:    Mikki Santee RDCS (AE) Referring Phys: [id number] AMRIT ADHIKARI IMPRESSIONS  1. Left ventricular ejection fraction, by estimation, is 45 to 50%. The left ventricle has mildly decreased function. The left ventricle demonstrates regional wall motion abnormalities (see scoring diagram/findings for description). There is moderate left ventricular hypertrophy. Left ventricular diastolic parameters are indeterminate. There is moderate hypokinesis of the left ventricular, entire inferior wall.  2. Right ventricular systolic function is normal. The right ventricular size is normal. There is  normal pulmonary artery systolic pressure.  3. Left atrial size was severely dilated.  4. Large pleural effusion in the left lateral region.  5. The mitral valve is grossly normal. Moderate to severe mitral valve regurgitation.  6. The aortic valve is tricuspid. Aortic valve regurgitation is not visualized. Mild to moderate aortic valve stenosis. Aortic valve area, by VTI measures 1.49 cm. Aortic valve mean gradient measures 10.4 mmHg. Aortic valve Vmax measures 2.17 m/s.  7. The inferior vena cava is dilated in size with >50% respiratory variability, suggesting right atrial pressure of 8 mmHg. Comparison(s): 02/16/19: LVEF 60-65%, severe MR, severe LAE. FINDINGS  Left Ventricle: Left ventricular ejection fraction, by estimation, is  45 to 50%. The left ventricle has mildly decreased function. The left ventricle demonstrates regional wall motion abnormalities. Moderate hypokinesis of the left ventricular, entire inferior wall. The left ventricular internal cavity size was normal in size. There is moderate left ventricular hypertrophy. Left ventricular diastolic parameters are indeterminate. Right Ventricle: The right ventricular size is normal. No increase in right ventricular wall thickness. Right ventricular systolic function is normal. There is normal pulmonary artery systolic pressure. The tricuspid regurgitant velocity is 2.29 m/s, and  with an assumed right atrial pressure of 8 mmHg, the estimated right ventricular systolic pressure is 78.2 mmHg. Left Atrium: Left atrial size was severely dilated. Right Atrium: Right atrial size was normal in size. Pericardium: There is no evidence of pericardial effusion. Mitral Valve: The mitral valve is grossly normal. Moderate to severe mitral valve regurgitation, with posteriorly-directed jet. Tricuspid Valve: The tricuspid valve is grossly normal. Tricuspid valve regurgitation is trivial. Aortic Valve: The aortic valve is tricuspid. Aortic valve regurgitation is not  visualized. Mild to moderate aortic stenosis is present. Aortic valve mean gradient measures 10.4 mmHg. Aortic valve peak gradient measures 18.8 mmHg. Aortic valve area, by VTI measures 1.49 cm. Pulmonic Valve: The pulmonic valve was grossly normal. Pulmonic valve regurgitation is trivial. Aorta: The aortic root and ascending aorta are structurally normal, with no evidence of dilitation. Venous: The inferior vena cava is dilated in size with greater than 50% respiratory variability, suggesting right atrial pressure of 8 mmHg. IAS/Shunts: No atrial level shunt detected by color flow Doppler. Additional Comments: There is a large pleural effusion in the left lateral region.  LEFT VENTRICLE PLAX 2D LVIDd:         4.60 cm  Diastology LVIDs:         3.80 cm  LV e' lateral:   6.97 cm/s LV PW:         1.30 cm  LV E/e' lateral: 12.6 LV IVS:        1.30 cm  LV e' medial:    4.79 cm/s LVOT diam:     2.30 cm  LV E/e' medial:  18.3 LV SV:         85 LV SV Index:   46 LVOT Area:     4.15 cm  RIGHT VENTRICLE RV S prime:     14.50 cm/s TAPSE (M-mode): 1.8 cm LEFT ATRIUM              Index       RIGHT ATRIUM           Index LA diam:        4.00 cm  2.16 cm/m  RA Area:     17.60 cm LA Vol (A2C):   114.0 ml 61.61 ml/m RA Volume:   40.30 ml  21.78 ml/m LA Vol (A4C):   67.8 ml  36.64 ml/m LA Biplane Vol: 94.1 ml  50.86 ml/m  AORTIC VALVE AV Area (Vmax):    1.36 cm AV Area (Vmean):   1.33 cm AV Area (VTI):     1.49 cm AV Vmax:           216.80 cm/s AV Vmean:          151.600 cm/s AV VTI:            0.571 m AV Peak Grad:      18.8 mmHg AV Mean Grad:      10.4 mmHg LVOT Vmax:         70.80 cm/s LVOT  Vmean:        48.400 cm/s LVOT VTI:          0.204 m LVOT/AV VTI ratio: 0.36  AORTA Ao Root diam: 3.20 cm MITRAL VALVE               TRICUSPID VALVE MV Area (PHT): 3.65 cm    TR Peak grad:   21.0 mmHg MV Decel Time: 208 msec    TR Vmax:        229.00 cm/s MR Peak grad: 103.2 mmHg MR Vmax:      508.00 cm/s  SHUNTS MV E velocity:  87.50 cm/s  Systemic VTI:  0.20 m MV A velocity: 33.40 cm/s  Systemic Diam: 2.30 cm MV E/A ratio:  2.62 Lyman Bishop MD Electronically signed by Lyman Bishop MD Signature Date/Time: 05/25/2019/4:51:06 PM    Final    IR THORACENTESIS ASP PLEURAL SPACE W/IMG GUIDE  Result Date: 05/27/2019 INDICATION: Patient with history of congestive heart failure, now with left pleural effusion. Request is made for therapeutic thoracentesis. EXAM: ULTRASOUND GUIDED THERAPEUTIC THORACENTESIS MEDICATIONS: 10 mL 1% lidocaine COMPLICATIONS: None immediate. PROCEDURE: An ultrasound guided thoracentesis was thoroughly discussed with the patient and questions answered. The benefits, risks, alternatives and complications were also discussed. The patient understands and wishes to proceed with the procedure. Written consent was obtained. Ultrasound was performed to localize and mark an adequate pocket of fluid in the left chest. The area was then prepped and draped in the normal sterile fashion. 1% Lidocaine was used for local anesthesia. Under ultrasound guidance a 6 Fr Safe-T-Centesis catheter was introduced. Thoracentesis was performed. The catheter was removed and a dressing applied. FINDINGS: A total of approximately 550 mL of yellow fluid was removed. IMPRESSION: Successful ultrasound guided therapeutic thoracentesis yielding 550 mL of pleural fluid. Read by: Brynda Greathouse PA-C Electronically Signed   By: Jerilynn Mages.  Shick M.D.   On: 05/27/2019 13:58     Subjective: Patient seen and examined.  No overnight events.  Some discomfort on the right chest.   Discharge Exam: Vitals:   06/21/19 0805 06/21/19 0942  BP: (!) 143/59 (!) 137/44  Pulse: 84   Resp: 20   Temp: 97.6 F (36.4 C)   SpO2: 95%    Vitals:   06/21/19 0025 06/21/19 0448 06/21/19 0805 06/21/19 0942  BP: (!) 120/43 (!) 169/59 (!) 143/59 (!) 137/44  Pulse: 60 62 84   Resp: 18 17 20    Temp: 98 F (36.7 C) 98.3 F (36.8 C) 97.6 F (36.4 C)   TempSrc: Oral Oral  Oral   SpO2: 94% 97% 95%   Weight:      Height:        General: Pt is alert, awake, not in acute distress Cardiovascular: RRR, S1/S2 +, no rubs, no gallops Dressing intact.  Not removed by me on the right chest.  Inspected by cardiology. Respiratory: CTA bilaterally, no wheezing, no rhonchi Abdominal: Soft, NT, ND, bowel sounds + Extremities: no edema, no cyanosis    The results of significant diagnostics from this hospitalization (including imaging, microbiology, ancillary and laboratory) are listed below for reference.     Microbiology: Recent Results (from the past 240 hour(s))  Respiratory Panel by RT PCR (Flu A&B, Covid) - Nasopharyngeal Swab     Status: None   Collection Time: 06/17/19  7:32 PM   Specimen: Nasopharyngeal Swab  Result Value Ref Range Status   SARS Coronavirus 2 by RT PCR NEGATIVE NEGATIVE Final    Comment: (NOTE)  SARS-CoV-2 target nucleic acids are NOT DETECTED. The SARS-CoV-2 RNA is generally detectable in upper respiratoy specimens during the acute phase of infection. The lowest concentration of SARS-CoV-2 viral copies this assay can detect is 131 copies/mL. A negative result does not preclude SARS-Cov-2 infection and should not be used as the sole basis for treatment or other patient management decisions. A negative result may occur with  improper specimen collection/handling, submission of specimen other than nasopharyngeal swab, presence of viral mutation(s) within the areas targeted by this assay, and inadequate number of viral copies (<131 copies/mL). A negative result must be combined with clinical observations, patient history, and epidemiological information. The expected result is Negative. Fact Sheet for Patients:  PinkCheek.be Fact Sheet for Healthcare Providers:  GravelBags.it This test is not yet ap proved or cleared by the [place] FDA and  has been authorized for detection  and/or diagnosis of SARS-CoV-2 by FDA under an Emergency Use Authorization (EUA). This EUA will remain  in effect (meaning this test can be used) for the duration of the COVID-19 declaration under Section 564(b)(1) of the Act, 21 U.S.C. section 360bbb-3(b)(1), unless the authorization is terminated or revoked sooner.    Influenza A by PCR NEGATIVE NEGATIVE Final   Influenza B by PCR NEGATIVE NEGATIVE Final    Comment: (NOTE) The Xpert Xpress SARS-CoV-2/FLU/RSV assay is intended as an aid in  the diagnosis of influenza from Nasopharyngeal swab specimens and  should not be used as a sole basis for treatment. Nasal washings and  aspirates are unacceptable for Xpert Xpress SARS-CoV-2/FLU/RSV  testing. Fact Sheet for Patients: PinkCheek.be Fact Sheet for Healthcare Providers: GravelBags.it This test is not yet approved or cleared by the [place] FDA and  has been authorized for detection and/or diagnosis of SARS-CoV-2 by  FDA under an Emergency Use Authorization (EUA). This EUA will remain  in effect (meaning this test can be used) for the duration of the  Covid-19 declaration under Section 564(b)(1) of the Act, 21  U.S.C. section 360bbb-3(b)(1), unless the authorization is  terminated or revoked. Performed at [facility], [place] [address] [place], [place] [address]   Surgical PCR screen     Status: None   Collection Time: 06/19/19  2:18 PM   Specimen: Nasal Mucosa; Nasal Swab  Result Value Ref Range Status   MRSA, PCR NEGATIVE NEGATIVE Final   Staphylococcus aureus NEGATIVE NEGATIVE Final    Comment: (NOTE) The Xpert SA Assay (FDA approved for NASAL specimens in patients 16 years of age and older), is one component of a comprehensive surveillance program. It is not intended to diagnose infection nor to guide or monitor treatment. Performed at [facility], [place] [address], [place], [place] [address]       Labs: BNP (last 3 results) Recent Labs    05/25/19 0135  BNP 4,580.9*   Basic Metabolic Panel: Recent Labs  Lab 06/17/19 1932 06/17/19 1932 06/17/19 1959 06/17/19 2132 06/18/19 0500 06/19/19 0339 06/21/19 0547  NA 139   < > 138 141 142 141 138  K 4.2   < > 4.1 3.9 4.1 4.4 4.3  CL 101  --  102  --  106 108 106  CO2 19*  --   --   --  26 26 24   GLUCOSE 316*  --  297*  --  126* 108* 113*  BUN 42*  --  38*  --  41* 40* 38*  CREATININE 2.48*  --  2.50*  --  2.42* 2.30* 2.17*  CALCIUM 8.6*  --   --   --  8.8* 8.6* 8.6*  MG 2.4  --   --   --   --   --   --    < > = values in this interval not displayed.   Liver Function Tests: Recent Labs  Lab 06/17/19 1932 06/18/19 0500 06/19/19 0339  AST 190* 93* 49*  ALT 59* 51* 39  ALKPHOS 106 89 64  BILITOT 0.7 0.9 0.6  PROT 5.5* 5.2* 4.6*  ALBUMIN 3.0* 2.7* 2.5*   No results for input(s): LIPASE, AMYLASE in the last 168 hours. No results for input(s): AMMONIA in the last 168 hours. CBC: Recent Labs  Lab 06/17/19 1356 06/17/19 1932 06/17/19 1959 06/17/19 2132 06/18/19 0500  WBC 6.7 11.2*  --   --  5.8  NEUTROABS 4.6 6.4  --   --   --   HGB 9.6* 10.1* 10.2* 9.9* 9.6*  HCT 30.4* 33.3* 30.0* 29.0* 30.4*  MCV 95.9 100.6*  --   --  98.7  PLT 237 326  --   --  206   Cardiac Enzymes: No results for input(s): CKTOTAL, CKMB, CKMBINDEX, TROPONINI in the last 168 hours. BNP: Invalid input(s): POCBNP CBG: Recent Labs  Lab 06/20/19 0618 06/20/19 1103 06/20/19 1638 06/20/19 2240 06/21/19 0626  GLUCAP 125* 155* 87 145* 109*   D-Dimer No results for input(s): DDIMER in the last 72 hours. Hgb A1c No results for input(s): HGBA1C in the last 72 hours. Lipid Profile No results for input(s): CHOL, HDL, LDLCALC, TRIG, CHOLHDL, LDLDIRECT in the last 72 hours. Thyroid function studies No results for input(s): TSH, T4TOTAL, T3FREE, THYROIDAB in the last 72 hours.  Invalid input(s): FREET3 Anemia work up No results for  input(s): VITAMINB12, FOLATE, FERRITIN, TIBC, IRON, RETICCTPCT in the last 72 hours. Urinalysis    Component Value Date/Time   COLORURINE AMBER (A) 06/18/2019 0022   APPEARANCEUR CLOUDY (A) 06/18/2019 0022   LABSPEC 1.015 06/18/2019 0022   PHURINE 5.0 06/18/2019 0022   GLUCOSEU 50 (A) 06/18/2019 0022   HGBUR LARGE (A) 06/18/2019 0022   BILIRUBINUR NEGATIVE 06/18/2019 0022   KETONESUR NEGATIVE 06/18/2019 0022   PROTEINUR 100 (A) 06/18/2019 0022   NITRITE NEGATIVE 06/18/2019 0022   LEUKOCYTESUR TRACE (A) 06/18/2019 0022   Sepsis Labs Invalid input(s): PROCALCITONIN,  WBC,  LACTICIDVEN Microbiology Recent Results (from the past 240 hour(s))  Respiratory Panel by RT PCR (Flu A&B, Covid) - Nasopharyngeal Swab     Status: None   Collection Time: 06/17/19  7:32 PM   Specimen: Nasopharyngeal Swab  Result Value Ref Range Status   SARS Coronavirus 2 by RT PCR NEGATIVE NEGATIVE Final    Comment: (NOTE) SARS-CoV-2 target nucleic acids are NOT DETECTED. The SARS-CoV-2 RNA is generally detectable in upper respiratoy specimens during the acute phase of infection. The lowest concentration of SARS-CoV-2 viral copies this assay can detect is 131 copies/mL. A negative result does not preclude SARS-Cov-2 infection and should not be used as the sole basis for treatment or other patient management decisions. A negative result may occur with  improper specimen collection/handling, submission of specimen other than nasopharyngeal swab, presence of viral mutation(s) within the areas targeted by this assay, and inadequate number of viral copies (<131 copies/mL). A negative result must be combined with clinical observations, patient history, and epidemiological information. The expected result is Negative. Fact Sheet for Patients:  PinkCheek.be Fact Sheet for Healthcare Providers:  GravelBags.it This test is  not yet ap proved or cleared by the  [place] and  has been authorized for detection and/or diagnosis of SARS-CoV-2 by FDA under an Emergency Use Authorization (EUA). This EUA will remain  in effect (meaning this test can be used) for the duration of the COVID-19 declaration under Section 564(b)(1) of the Act, 21 U.S.C. section 360bbb-3(b)(1), unless the authorization is terminated or revoked sooner.    Influenza A by PCR NEGATIVE NEGATIVE Final   Influenza B by PCR NEGATIVE NEGATIVE Final    Comment: (NOTE) The Xpert Xpress SARS-CoV-2/FLU/RSV assay is intended as an aid in  the diagnosis of influenza from Nasopharyngeal swab specimens and  should not be used as a sole basis for treatment. Nasal washings and  aspirates are unacceptable for Xpert Xpress SARS-CoV-2/FLU/RSV  testing. Fact Sheet for Patients: PinkCheek.be Fact Sheet for Healthcare Providers: GravelBags.it This test is not yet approved or cleared by the [place] FDA and  has been authorized for detection and/or diagnosis of SARS-CoV-2 by  FDA under an Emergency Use Authorization (EUA). This EUA will remain  in effect (meaning this test can be used) for the duration of the  Covid-19 declaration under Section 564(b)(1) of the Act, 21  U.S.C. section 360bbb-3(b)(1), unless the authorization is  terminated or revoked. Performed at [facility], [place] [address], [place], [place] [address]   Surgical PCR screen     Status: None   Collection Time: 06/19/19  2:18 PM   Specimen: Nasal Mucosa; Nasal Swab  Result Value Ref Range Status   MRSA, PCR NEGATIVE NEGATIVE Final   Staphylococcus aureus NEGATIVE NEGATIVE Final    Comment: (NOTE) The Xpert SA Assay (FDA approved for NASAL specimens in patients 69 years of age and older), is one component of a comprehensive surveillance program. It is not intended to diagnose infection nor to guide or monitor treatment. Performed at [facility], [place] [address], [place], [place] [address]      Time coordinating discharge:  35 minutes  SIGNED:   Barb Merino, MD  [facility] 06/21/2019, 10:29 AM

## 2019-06-22 ENCOUNTER — Telehealth: Payer: Self-pay | Admitting: *Deleted

## 2019-06-22 NOTE — Telephone Encounter (Signed)
This RN spoke with the patient per her call stating she would like to cancel further injections due to "after I got my shot on Friday I had to go to the ER and get a pacemaker put in "  This RN informed pt unsure if Dr Jannifer Rodney knew the above- and offered her an appointment to discuss further- she nicely declined stating she would prefer not to do the shots and follow up as scheduled in June with Dr Jana Hakim.  This RN informed MD of above- verified appointment in June appropriate as well as canceled injections up to that appointment.  Call returned to pt and obtained identified VM- message left per above.

## 2019-06-24 ENCOUNTER — Telehealth: Payer: Self-pay | Admitting: Cardiology

## 2019-06-24 ENCOUNTER — Emergency Department (HOSPITAL_BASED_OUTPATIENT_CLINIC_OR_DEPARTMENT_OTHER)
Admission: EM | Admit: 2019-06-24 | Discharge: 2019-06-24 | Disposition: A | Discharge status: Home / Self Care | Payer: Medicare Other | Attending: Emergency Medicine | Admitting: Emergency Medicine

## 2019-06-24 ENCOUNTER — Telehealth: Payer: Self-pay | Admitting: Family Medicine

## 2019-06-24 ENCOUNTER — Ambulatory Visit: Payer: Medicare Other | Admitting: Family Medicine

## 2019-06-24 ENCOUNTER — Emergency Department (HOSPITAL_BASED_OUTPATIENT_CLINIC_OR_DEPARTMENT_OTHER): Payer: Medicare Other

## 2019-06-24 ENCOUNTER — Other Ambulatory Visit: Payer: Self-pay

## 2019-06-24 ENCOUNTER — Encounter (HOSPITAL_BASED_OUTPATIENT_CLINIC_OR_DEPARTMENT_OTHER): Payer: Self-pay

## 2019-06-24 DIAGNOSIS — Z87891 Personal history of nicotine dependence: Secondary | ICD-10-CM | POA: Insufficient documentation

## 2019-06-24 DIAGNOSIS — R0602 Shortness of breath: Secondary | ICD-10-CM | POA: Diagnosis present

## 2019-06-24 DIAGNOSIS — J90 Pleural effusion, not elsewhere classified: Secondary | ICD-10-CM | POA: Diagnosis not present

## 2019-06-24 DIAGNOSIS — R072 Precordial pain: Secondary | ICD-10-CM | POA: Insufficient documentation

## 2019-06-24 DIAGNOSIS — E039 Hypothyroidism, unspecified: Secondary | ICD-10-CM | POA: Diagnosis not present

## 2019-06-24 DIAGNOSIS — I129 Hypertensive chronic kidney disease with stage 1 through stage 4 chronic kidney disease, or unspecified chronic kidney disease: Secondary | ICD-10-CM | POA: Insufficient documentation

## 2019-06-24 DIAGNOSIS — N184 Chronic kidney disease, stage 4 (severe): Secondary | ICD-10-CM | POA: Diagnosis not present

## 2019-06-24 DIAGNOSIS — E1122 Type 2 diabetes mellitus with diabetic chronic kidney disease: Secondary | ICD-10-CM | POA: Diagnosis not present

## 2019-06-24 LAB — CBC WITH DIFFERENTIAL/PLATELET
Abs Immature Granulocytes: 0.04 10*3/uL (ref 0.00–0.07)
Basophils Absolute: 0.1 10*3/uL (ref 0.0–0.1)
Basophils Relative: 1 %
Eosinophils Absolute: 0.3 10*3/uL (ref 0.0–0.5)
Eosinophils Relative: 3 %
HCT: 31.5 % — ABNORMAL LOW (ref 36.0–46.0)
Hemoglobin: 9.8 g/dL — ABNORMAL LOW (ref 12.0–15.0)
Immature Granulocytes: 0 %
Lymphocytes Relative: 13 %
Lymphs Abs: 1.2 10*3/uL (ref 0.7–4.0)
MCH: 30.3 pg (ref 26.0–34.0)
MCHC: 31.1 g/dL (ref 30.0–36.0)
MCV: 97.5 fL (ref 80.0–100.0)
Monocytes Absolute: 0.8 10*3/uL (ref 0.1–1.0)
Monocytes Relative: 9 %
Neutro Abs: 7 10*3/uL (ref 1.7–7.7)
Neutrophils Relative %: 74 %
Platelets: 287 10*3/uL (ref 150–400)
RBC: 3.23 MIL/uL — ABNORMAL LOW (ref 3.87–5.11)
RDW: 14.7 % (ref 11.5–15.5)
WBC: 9.3 10*3/uL (ref 4.0–10.5)
nRBC: 0 % (ref 0.0–0.2)

## 2019-06-24 LAB — COMPREHENSIVE METABOLIC PANEL
ALT: 17 U/L (ref 0–44)
AST: 45 U/L — ABNORMAL HIGH (ref 15–41)
Albumin: 3.4 g/dL — ABNORMAL LOW (ref 3.5–5.0)
Alkaline Phosphatase: 57 U/L (ref 38–126)
Anion gap: 10 (ref 5–15)
BUN: 48 mg/dL — ABNORMAL HIGH (ref 8–23)
CO2: 24 mmol/L (ref 22–32)
Calcium: 8.7 mg/dL — ABNORMAL LOW (ref 8.9–10.3)
Chloride: 104 mmol/L (ref 98–111)
Creatinine, Ser: 2.31 mg/dL — ABNORMAL HIGH (ref 0.44–1.00)
GFR calc Af Amer: 21 mL/min — ABNORMAL LOW (ref >60)
GFR calc non Af Amer: 18 mL/min — ABNORMAL LOW (ref >60)
Glucose, Bld: 147 mg/dL — ABNORMAL HIGH (ref 70–99)
Potassium: 4.1 mmol/L (ref 3.5–5.1)
Sodium: 138 mmol/L (ref 135–145)
Total Bilirubin: 0.7 mg/dL (ref 0.3–1.2)
Total Protein: 6.2 g/dL — ABNORMAL LOW (ref 6.5–8.1)

## 2019-06-24 LAB — BRAIN NATRIURETIC PEPTIDE: B Natriuretic Peptide: 1487.1 pg/mL — ABNORMAL HIGH (ref 0.0–100.0)

## 2019-06-24 LAB — TROPONIN I (HIGH SENSITIVITY)
Troponin I (High Sensitivity): 44 ng/L — ABNORMAL HIGH (ref <18)
Troponin I (High Sensitivity): 41 ng/L — ABNORMAL HIGH (ref <18)

## 2019-06-24 MED ORDER — FUROSEMIDE 10 MG/ML IJ SOLN
40.0000 mg | Freq: Once | INTRAMUSCULAR | Status: AC
  Administered 2019-06-24: 12:00:00 40 mg via INTRAVENOUS
  Filled 2019-06-24: qty 4

## 2019-06-24 MED FILL — Lidocaine HCl Local Inj 1%: INTRAMUSCULAR | Qty: 60 | Status: AC

## 2019-06-24 NOTE — ED Provider Notes (Signed)
Emergency Department Provider Note   I have reviewed the triage vital signs and the nursing notes.   HISTORY  Chief Complaint Shortness of Breath   HPI Lauren Walker is a 86 y.o. female with PMH of HTN, CAD, DM, CKD, PAF on anticoagulation, CHF (EF 45-50%), and recent admit for V-tach requiring cardioversion and AICD placement presents to the emergency department with increased weight gain over the past several days with shortness of breath and bandlike chest discomfort.  Patient states that the shortness of breath began this morning.  She is having some dyspnea at rest but increased slightly with exertion.  She describes a bandlike tightness around her entire chest with no radiation of symptoms or other modifying factors.  No pleuritic pain.  No cough.  She states that her weight has been going up but her legs do not seem to be swelling significantly.  She is wearing compression stockings and has been compliant with her medications including torsemide.  She called her family medicine doctor and has an appointment this afternoon at 2 PM.  She called her cardiologist who advised that she present to the emergency department for chest x-ray.    Past Medical History:  Diagnosis Date  . Arthritis   . Chronic kidney disease   . Chronic systolic dysfunction of left ventricle   . Coronary artery disease   . Diabetes mellitus without complication ([place])   . Diverticulitis   . Hypertension   . Hypothyroidism   . Myocardial infarction ([place])   . Paroxysmal atrial fibrillation ([facility])   . Thyroid disease     Patient Active Problem List   Diagnosis Date Noted  . V-tach ([place]) 06/17/2019  . Sinus bradycardia 06/17/2019  . Transaminitis 06/17/2019  . Acute CHF (congestive heart failure) ([place]) 05/25/2019  . CKD stage G4/A1, GFR 15-29 and albumin creatinine ratio <30 mg/g ([facility]) 05/17/2019  . Anemia associated with stage 4 chronic renal failure ([place]) 05/17/2019  . Malignant neoplasm of  upper-outer quadrant of left breast in female, estrogen receptor positive ([place]) 03/02/2019  . Aortic stenosis 02/14/2019  . Mitral regurgitation moderate to severe based on echocardiogram in summer 2020 11/04/2018  . Paroxysmal atrial fibrillation ([place]) 10/11/2018  . Hyperlipidemia associated with type 2 diabetes mellitus ([place]) 08/16/2018  . Type 2 diabetes mellitus with other circulatory complications ([place]) 22/97/9892  . Hypothyroid 08/16/2018  . HTN (hypertension) 08/16/2018  . CAD (coronary artery disease) 08/16/2018  . History of MI (myocardial infarction) 08/16/2018    Past Surgical History:  Procedure Laterality Date  . APPENDECTOMY  1936  . ICD IMPLANT N/A 06/20/2019   Procedure: ICD IMPLANT;  Surgeon: Evans Lance, MD;  Location: [facility];  Service: Cardiovascular;  Laterality: N/A;  . IR THORACENTESIS ASP PLEURAL SPACE W/IMG GUIDE  05/27/2019  . PARS PLANA VITRECTOMY Right 12/04/2018   Procedure: RETINAL DETACHMENT REPAIR PPV 25 GAUGE WITH ENDO LASER AIR/FLUID EXCHANGE SF6 GAS INJECTION;  Surgeon: Jalene Mullet, MD;  Location: [facility];  Service: Ophthalmology;  Laterality: Right;  . TONSILLECTOMY      Allergies Amiodarone and Tape  Family History  Problem Relation Age of Onset  . Arthritis Mother   . Heart disease Mother   . Hypertension Mother   . Arthritis Father   . Heart attack Father   . Heart disease Father   . Arthritis Daughter   . Arthritis Son   . Depression Maternal Aunt   . Hyperlipidemia Maternal Aunt   . Hypertension Maternal  Aunt     Social History Social History   Tobacco Use  . Smoking status: Former Research scientist (life sciences)  . Smokeless tobacco: Never Used  Substance Use Topics  . Alcohol use: Yes    Comment: Very rare  . Drug use: Not Currently    Review of Systems  Constitutional: No fever/chills Eyes: No visual changes. ENT: No sore throat. Cardiovascular: Positive chest pain and increased weight gain (166 lbs- 171 lbs) Respiratory:  Positive shortness of breath. Gastrointestinal: No abdominal pain.  No nausea, no vomiting.  No diarrhea.  No constipation. Genitourinary: Negative for dysuria. Musculoskeletal: Negative for back pain. Skin: Negative for rash. Neurological: Negative for headaches, focal weakness or numbness.  10-point ROS otherwise negative.  ____________________________________________   PHYSICAL EXAM:  VITAL SIGNS: ED Triage Vitals  Enc Vitals Group     BP 06/24/19 1103 (!) 117/56     Pulse Rate 06/24/19 1103 63     Resp 06/24/19 1103 17     Temp 06/24/19 1103 98.1 F (36.7 C)     Temp Source 06/24/19 1103 Oral     SpO2 06/24/19 1103 97 %     Weight 06/24/19 1100 174 lb 3.2 oz (79 kg)     Height 06/24/19 1100 5\' 4"  (1.626 m)   Constitutional: Alert and oriented. Well appearing and in no acute distress. Eyes: Conjunctivae are normal.  Head: Atraumatic. Nose: No congestion/rhinnorhea. Mouth/Throat: Mucous membranes are moist. Neck: No stridor.   Cardiovascular: Normal rate, regular rhythm. Good peripheral circulation. Grossly normal heart sounds.   Respiratory: Normal respiratory effort.  No retractions. Lungs overall clear with occasional faint crackles. No wheezing.  Gastrointestinal: Soft and nontender. No distention.  Musculoskeletal: No lower extremity tenderness with bilateral 2+ pitting edema. No gross deformities of extremities. Neurologic:  Normal speech and language.  Skin:  Skin is warm, dry and intact. No rash noted.  ____________________________________________   LABS (all labs ordered are listed, but only abnormal results are displayed)  Labs Reviewed  COMPREHENSIVE METABOLIC PANEL - Abnormal; Notable for the following components:      Result Value   Glucose, Bld 147 (*)    BUN 48 (*)    Creatinine, Ser 2.31 (*)    Calcium 8.7 (*)    Total Protein 6.2 (*)    Albumin 3.4 (*)    AST 45 (*)    GFR calc non Af Amer 18 (*)    GFR calc Af Amer 21 (*)    All other  components within normal limits  BRAIN NATRIURETIC PEPTIDE - Abnormal; Notable for the following components:   B Natriuretic Peptide 1,487.1 (*)    All other components within normal limits  CBC WITH DIFFERENTIAL/PLATELET - Abnormal; Notable for the following components:   RBC 3.23 (*)    Hemoglobin 9.8 (*)    HCT 31.5 (*)    All other components within normal limits  TROPONIN I (HIGH SENSITIVITY) - Abnormal; Notable for the following components:   Troponin I (High Sensitivity) 44 (*)    All other components within normal limits  TROPONIN I (HIGH SENSITIVITY) - Abnormal; Notable for the following components:   Troponin I (High Sensitivity) 41 (*)    All other components within normal limits   ____________________________________________  EKG   EKG Interpretation  Date/Time:  Friday Jun 24 2019 11:01:55 EDT Ventricular Rate:  68 PR Interval:    QRS Duration: 131 QT Interval:  503 QTC Calculation: 532 R Axis:   -42 Text Interpretation: A-V dual-paced  rhythm with some inhibition No further analysis attempted due to paced rhythm Confirmed by Nanda Quinton [id number]) on 06/24/2019 11:15:53 AM       ____________________________________________  RADIOLOGY  DG Chest 2 View  Result Date: 06/24/2019 CLINICAL DATA:  Shortness of breath. Cardiac arrhythmia with pacemaker implantation EXAM: CHEST - 2 VIEW COMPARISON:  Jun 21, 2019 FINDINGS: There is a left pleural effusion with left base atelectasis. Lungs elsewhere are clear. Heart is upper normal in size with pulmonary vascularity normal. Pacemaker leads are attached to the right atrium and middle cardiac vein. No adenopathy. There is aortic atherosclerosis. There is degenerative change in the thoracic spine. IMPRESSION: Left pleural effusion with left base atelectasis. Lungs elsewhere clear. Heart upper normal in size. Pacemaker leads as noted. Aortic Atherosclerosis (ICD10-I70.0). Electronically Signed   By: Lowella Grip III M.D.   On:  06/24/2019 11:39    ____________________________________________   PROCEDURES  Procedure(s) performed:   Procedures  None  ____________________________________________   INITIAL IMPRESSION / ASSESSMENT AND PLAN / ED COURSE  Pertinent labs & imaging results that were available during my care of the patient were reviewed by me and considered in my medical decision making (see chart for details).   Patient presents emergency department with increased shortness of breath and bandlike chest discomfort this morning.  She states it feels similar to prior CHF flares.  Her weight is up by several pounds since hospital discharge. EKG with A-V paced rhythm as interpreted above. CXR and labs pending. No acute distress, increased WOB, or hypoxemia here.   12:14 PM  Patient's labs and imaging reviewed.  She has reaccumulated a pleural effusion on the left which may be the cause of her symptoms.  She had a pleural effusion drained in April during admission with 550 mL of fluid at that time.  Patient ambulated here in the emergency department without shortness of breath or hypoxemia. See nursing documentation. She is overall feeling well.  BNP is elevated but not to the level seen previously.  Kidney function is at baseline.  Plan for IV Lasix here and second troponin with only mild elevation initially.  Doubt primary ACS.  Discussed the pleural effusion results with the patient.  She will require close follow-up for this and possible repeat thoracentesis although I do not see an indication for hospitalization at this time. ____________________________________________  FINAL CLINICAL IMPRESSION(S) / ED DIAGNOSES  Final diagnoses:  Pleural effusion, left  SOB (shortness of breath)  Precordial chest pain     MEDICATIONS GIVEN DURING THIS VISIT:  Medications  furosemide (LASIX) injection 40 mg (40 mg Intravenous Given 06/24/19 1219)    Note:  This document was prepared using Dragon voice  recognition software and may include unintentional dictation errors.  Nanda Quinton, MD, [facility]    Long, Wonda Old[facility], MD 06/25/19 1257

## 2019-06-24 NOTE — Telephone Encounter (Signed)
Pt c/o Shortness Of Breath: STAT if SOB developed within the last 24 hours or pt is noticeably SOB on the phone  1. Are you currently SOB (can you hear that pt is SOB on the phone)? Yes but not noticeable on the phone  2. How long have you been experiencing SOB? Yesterday but worse today  3. Are you SOB when sitting or when up moving around? sitting  4. Are you currently experiencing any other symptoms? dizzy   Had a ICD Implant on 06/20/19.

## 2019-06-24 NOTE — Telephone Encounter (Signed)
FYI, waiting on triage note

## 2019-06-24 NOTE — Telephone Encounter (Signed)
Pt sent to ER by Cardiology.

## 2019-06-24 NOTE — Telephone Encounter (Signed)
Happy to see her this afternoon as long as she's not in distress.  She also needs to make cardiology aware.

## 2019-06-24 NOTE — Telephone Encounter (Signed)
This makes me feel better about the current situation and she is ok to wait until her 2pm appt

## 2019-06-24 NOTE — Discharge Instructions (Signed)
You were seen in the ED today with shortness of breath and chest pain. Your x-ray shows re-accumulation of fluid on your lung called a pleural effusion. You were given medication for fluid here and should continue your medications at home. Please call your PCP and Cardiology team today and schedule appointments for next week. This fluid may need to be drained if it gets worse. You should return to the ED immediately if you develop any worsening trouble breathing, chest pain, fever, or other sudden/severe symptoms.

## 2019-06-24 NOTE — ED Triage Notes (Addendum)
Pt arrives with c/o swelling in chest and SOB after recent defibrillator placement on Tuesday. Pt has significant bruising to right chest.

## 2019-06-24 NOTE — Telephone Encounter (Signed)
Pt called in stating that she has gained wt, she states that she has CHF and just got out of the hospital from having a pacemaker put in on Tuesday. She states that she is having some shortness of breath. I sent her to the triage nurse. Pt can be reached at the home #   She states that she was told to call her pcp if she noticed her wt going up.

## 2019-06-24 NOTE — Telephone Encounter (Signed)
Called and spoke with pt. She advised that she has been having some SOB since Tuesday. She sounded normal on the phone today able to talk with no exacerbation. States she feels almost like a tight banding around her just below bra line and has had some dizziness. Pt states she is drinking water and may have consumed a little more than normal yesterday. Also advised that she had Corned Beef for lunch and then had take out [place] food for dinner with her daughter. The weight gain pt is complaining of is on 06/07/19 at [place] with PCP she was 166 and this morning she was 171.   Pt was advised to contact cardiology to inform them of her symptoms and that I was routing this to PCP for review.

## 2019-06-24 NOTE — Telephone Encounter (Signed)
Pt states that she is having increased shortness of breath. Pt states that she has gained from166 lb on 4/25 and now 171. Pt was discharged from the hospital this week after having a pacemaker place. Pt c/o chest tightness. Spoke with Dr. Geraldo Pitter and we advised the pt to go to the ED for evaluation. Pt verbalized understanding and states that she will go for evaluation.

## 2019-06-24 NOTE — ED Notes (Signed)
Ambulated in the hallway with O2 sats between 91 to 98% on RA.  We stopped 2 times while ambulating.  Patient  Denies being short of breathe while ambulating but did voiced of being tired.

## 2019-06-29 ENCOUNTER — Ambulatory Visit (INDEPENDENT_AMBULATORY_CARE_PROVIDER_SITE_OTHER): Payer: Medicare Other | Admitting: Family Medicine

## 2019-06-29 ENCOUNTER — Encounter: Payer: Self-pay | Admitting: Family Medicine

## 2019-06-29 ENCOUNTER — Other Ambulatory Visit: Payer: Self-pay

## 2019-06-29 VITALS — BP 123/83 | HR 80 | Temp 98.1°F | Resp 16 | Ht 64.0 in | Wt 166.4 lb

## 2019-06-29 DIAGNOSIS — Z950 Presence of cardiac pacemaker: Secondary | ICD-10-CM

## 2019-06-29 DIAGNOSIS — I5041 Acute combined systolic (congestive) and diastolic (congestive) heart failure: Secondary | ICD-10-CM | POA: Diagnosis not present

## 2019-06-29 DIAGNOSIS — I2510 Atherosclerotic heart disease of native coronary artery without angina pectoris: Secondary | ICD-10-CM | POA: Diagnosis not present

## 2019-06-29 DIAGNOSIS — I472 Ventricular tachycardia, unspecified: Secondary | ICD-10-CM

## 2019-06-29 DIAGNOSIS — J90 Pleural effusion, not elsewhere classified: Secondary | ICD-10-CM

## 2019-06-29 HISTORY — DX: Presence of cardiac pacemaker: Z95.0

## 2019-06-29 HISTORY — DX: Pleural effusion, not elsewhere classified: J90

## 2019-06-29 LAB — CBC WITH DIFFERENTIAL/PLATELET
Basophils Absolute: 0 10*3/uL (ref 0.0–0.1)
Basophils Relative: 0.7 % (ref 0.0–3.0)
Eosinophils Absolute: 0.2 10*3/uL (ref 0.0–0.7)
Eosinophils Relative: 2.7 % (ref 0.0–5.0)
HCT: 29.4 % — ABNORMAL LOW (ref 36.0–46.0)
Hemoglobin: 9.8 g/dL — ABNORMAL LOW (ref 12.0–15.0)
Lymphocytes Relative: 16.8 % (ref 12.0–46.0)
Lymphs Abs: 1.1 10*3/uL (ref 0.7–4.0)
MCHC: 33.2 g/dL (ref 30.0–36.0)
MCV: 93.5 fl (ref 78.0–100.0)
Monocytes Absolute: 0.5 10*3/uL (ref 0.1–1.0)
Monocytes Relative: 8.6 % (ref 3.0–12.0)
Neutro Abs: 4.5 10*3/uL (ref 1.4–7.7)
Neutrophils Relative %: 71.2 % (ref 43.0–77.0)
Platelets: 256 10*3/uL (ref 150.0–400.0)
RBC: 3.15 Mil/uL — ABNORMAL LOW (ref 3.87–5.11)
RDW: 15.2 % (ref 11.5–15.5)
WBC: 6.3 10*3/uL (ref 4.0–10.5)

## 2019-06-29 LAB — BASIC METABOLIC PANEL
BUN: 48 mg/dL — ABNORMAL HIGH (ref 6–23)
CO2: 30 mEq/L (ref 19–32)
Calcium: 8.6 mg/dL (ref 8.4–10.5)
Chloride: 100 mEq/L (ref 96–112)
Creatinine, Ser: 1.77 mg/dL — ABNORMAL HIGH (ref 0.40–1.20)
GFR: 27.06 mL/min — ABNORMAL LOW (ref >60.00)
Glucose, Bld: 116 mg/dL — ABNORMAL HIGH (ref 70–99)
Potassium: 4.1 mEq/L (ref 3.5–5.1)
Sodium: 140 mEq/L (ref 135–145)

## 2019-06-29 LAB — BRAIN NATRIURETIC PEPTIDE: Pro B Natriuretic peptide (BNP): 1689 pg/mL — ABNORMAL HIGH (ref 0.0–100.0)

## 2019-06-29 MED ORDER — QUINAPRIL HCL 10 MG PO TABS: 10.0000 mg | ORAL_TABLET | Freq: Two times a day (BID) | ORAL | 3 refills | Status: DC

## 2019-06-29 NOTE — Patient Instructions (Addendum)
Follow up as needed or as scheduled We'll notify you of your lab results and make any changes if needed Please go to the [facility] on [address] and get a chest xray at your convenience We ordered the rolling walker and someone should contact you I sent the new Accupril prescription to the pharmacy Call with any questions or concerns Hang in there!!

## 2019-06-29 NOTE — Assessment & Plan Note (Signed)
New.  Bandage is clean and dry.  She has extensive bruising of chest wall.  Has cards f/u next week.

## 2019-06-29 NOTE — Progress Notes (Signed)
   Subjective:    Patient ID: Lauren Walker, female    DOB: March 12, 1929, 86 y.o.   MRN: [id number]  HPI Hospital f/u- pt was admitted 4/30-5/4 w/ v tach.  This started after her first Aranesp infusion.  Her HR was initially >200 and not improved w/ Amiodarone.  She was emergently cardioverted and returned to sinus rhythm w/ bradycardia and QT prolongation.  She continued to have sinus pauses and had pacemaker placed on 5/3.  At d/c, she was restarted on quinapril, Coreg was added, she was switched to isosorbide dinitrate 40mg  BID, and restarted on Torsemide. On 5/7 she called w/ SOB and bandlike chest pain.  In ER she was found to have L pleural effusion.  She had similar effusion drained in April (536ml).  ER indicated that pleuracentesis may be needed but did not see reason for admission.   Denies recent palpitations, CP.  SOB 'is much better'.  Weight is down from 174 --> 166.  Pt feels swelling has improved.  Pt is asking for rolling walker w/ basket and seat as it is hard for her to walk using her cane.  She wants more stability and be able to sit and rest when needed.  Pt is asking to change prescription to Quinapril 10mg  BID rather than having to split the pills  Hospital H&P, notes, labs, imaging, D/C summary, ER documentation reviewed.  Meds reconciled.   Review of Systems For ROS see HPI   This visit occurred during the SARS-CoV-2 public health emergency.  Safety protocols were in place, including screening questions prior to the visit, additional usage of staff PPE, and extensive cleaning of exam room while observing appropriate contact time as indicated for disinfecting solutions.       Objective:   Physical Exam Vitals reviewed.  Constitutional:      General: She is not in acute distress.    Appearance: She is well-developed.  HENT:     Head: Normocephalic and atraumatic.  Eyes:     Conjunctiva/sclera: Conjunctivae normal.     Pupils: Pupils are equal, round, and reactive to  light.  Neck:     Thyroid: No thyromegaly.  Cardiovascular:     Rate and Rhythm: Normal rate and regular rhythm.     Heart sounds: Murmur present.  Pulmonary:     Effort: Pulmonary effort is normal. No respiratory distress.     Breath sounds: Normal breath sounds. No rhonchi or rales.  Chest:     Chest wall: Tenderness (over pacemaker site on R chest wall, incision is covered, dressing clean, extensive bruising) present.  Abdominal:     General: There is no distension.     Palpations: Abdomen is soft.     Tenderness: There is no abdominal tenderness.  Musculoskeletal:     Cervical back: Normal range of motion and neck supple.     Right lower leg: Edema (1+) present.     Left lower leg: Edema (1+) present.  Lymphadenopathy:     Cervical: No cervical adenopathy.  Skin:    General: Skin is warm and dry.  Neurological:     Mental Status: She is alert and oriented to person, place, and time.  Psychiatric:        Behavior: Behavior normal.           Assessment & Plan:

## 2019-06-29 NOTE — Assessment & Plan Note (Addendum)
Improving.  SOB is better.  BS are clear in both bases which hopefully indicates pleural effusion has resolved.  Swelling is better than before.  Still has SOB w/ exertion so will attempt to order pt a rolling walker w/ seat for better mobility and safety.  Will get BNP and BMP to assess.  Has f/u w/ Cards scheduled for next week.

## 2019-06-29 NOTE — Assessment & Plan Note (Signed)
Now w/ pacemaker in place.  Currently asymptomatic.  appt upcoming w/ cards.

## 2019-06-29 NOTE — Assessment & Plan Note (Signed)
Noted on recent CXR.  Lungs are CTAB today but will get CXR to reassess.  Pt expressed understanding and is in agreement w/ plan.

## 2019-06-30 ENCOUNTER — Ambulatory Visit: Payer: Medicare Other

## 2019-06-30 ENCOUNTER — Ambulatory Visit (HOSPITAL_BASED_OUTPATIENT_CLINIC_OR_DEPARTMENT_OTHER)
Admission: RE | Admit: 2019-06-30 | Discharge: 2019-06-30 | Disposition: A | Discharge status: Home / Self Care | Payer: Medicare Other | Source: Ambulatory Visit | Attending: Family Medicine | Admitting: Family Medicine

## 2019-06-30 DIAGNOSIS — J90 Pleural effusion, not elsewhere classified: Secondary | ICD-10-CM

## 2019-07-06 ENCOUNTER — Ambulatory Visit (INDEPENDENT_AMBULATORY_CARE_PROVIDER_SITE_OTHER): Payer: Medicare Other | Admitting: Cardiology

## 2019-07-06 ENCOUNTER — Encounter: Payer: Self-pay | Admitting: Cardiology

## 2019-07-06 ENCOUNTER — Other Ambulatory Visit: Payer: Self-pay

## 2019-07-06 ENCOUNTER — Telehealth: Payer: Self-pay | Admitting: *Deleted

## 2019-07-06 VITALS — BP 138/70 | HR 64 | Ht 64.0 in | Wt 161.0 lb

## 2019-07-06 DIAGNOSIS — I5041 Acute combined systolic (congestive) and diastolic (congestive) heart failure: Secondary | ICD-10-CM

## 2019-07-06 DIAGNOSIS — I340 Nonrheumatic mitral (valve) insufficiency: Secondary | ICD-10-CM

## 2019-07-06 DIAGNOSIS — I10 Essential (primary) hypertension: Secondary | ICD-10-CM

## 2019-07-06 DIAGNOSIS — I472 Ventricular tachycardia, unspecified: Secondary | ICD-10-CM

## 2019-07-06 DIAGNOSIS — Z95810 Presence of automatic (implantable) cardiac defibrillator: Secondary | ICD-10-CM

## 2019-07-06 DIAGNOSIS — I2510 Atherosclerotic heart disease of native coronary artery without angina pectoris: Secondary | ICD-10-CM

## 2019-07-06 DIAGNOSIS — N184 Chronic kidney disease, stage 4 (severe): Secondary | ICD-10-CM

## 2019-07-06 HISTORY — DX: Presence of automatic (implantable) cardiac defibrillator: Z95.810

## 2019-07-06 NOTE — Patient Instructions (Signed)
Medication Instructions:  Your physician recommends that you continue on your current medications as directed. Please refer to the Current Medication list given to you today.  *If you need a refill on your cardiac medications before your next appointment, please call your pharmacy*   Lab Work: Your physician recommends that you return for lab work today: bmp, pro bnp  If you have labs (blood work) drawn today and your tests are completely normal, you will receive your results only by: . MyChart Message (if you have MyChart) OR . A paper copy in the mail If you have any lab test that is abnormal or we need to change your treatment, we will call you to review the results.   Testing/Procedures: None    Follow-Up: At [facility], you and your health needs are our priority.  As part of our continuing mission to provide you with exceptional heart care, we have created designated Provider Care Teams.  These Care Teams include your primary Cardiologist (physician) and Advanced Practice Providers (APPs -  Physician Assistants and Nurse Practitioners) who all work together to provide you with the care you need, when you need it.  We recommend signing up for the patient portal called "MyChart".  Sign up information is provided on this After Visit Summary.  MyChart is used to connect with patients for Virtual Visits (Telemedicine).  Patients are able to view lab/test results, encounter notes, upcoming appointments, etc.  Non-urgent messages can be sent to your provider as well.   To learn more about what you can do with MyChart, go to [email or web address].    Your next appointment:   1 month(s)  The format for your next appointment:   In Person  Provider:   Robert Krasowski, MD   Other Instructions   

## 2019-07-06 NOTE — Progress Notes (Signed)
Cardiology Office Note:    Date:  07/06/2019   ID:  Lauren Walker, DOB 10-27-1929, MRN [id number]  PCP:  Lauren Minium, MD  Cardiologist:  Lauren Campus, MD    Referring MD: Lauren Minium, MD   Chief Complaint  Patient presents with  . Follow-up  Doing better  History of Present Illness:    Lauren Walker is a 86 y.o. female with past medical history significant for breast cancer only on hormonal therapy, chronic both systolic and diastolic congestive heart failure, remote history of coronary artery disease, paroxysmal atrial fibrillation, moderate to severe mitral regurgitation, kidney failure with creatinine did improve up to 2.2, essential hypertension dyslipidemia hypothyroidism.  Recently she presented to hospital because of syncopal episode she was find to have ventricular tachycardia, that was managed with amiodarone as well as cardioversion, eventually she ended up receiving ICD.  There was also some conversation about potentially doing cardiac catheterization but it never happened because of her kidney dysfunction.  She comes today to my office for follow-up.  She is doing better.  She has more energy.  Few days ago she ended going to the emergency room because of shortness of breath.  She was given diuretic and seems to be improving since that time she is doing better she has minor injuries she is able to do more.  No discharge from the defibrillator however she described to have some palpitations that she had yesterday she was not dizzy during this episode.  Lasted only for a few seconds.  Past Medical History:  Diagnosis Date  . Acute CHF (congestive heart failure) ([place]) 05/25/2019  . Anemia associated with stage 4 chronic renal failure ([place]) 05/17/2019  . Aortic stenosis 02/14/2019  . Arthritis   . CAD (coronary artery disease) 08/16/2018  . Chronic kidney disease   . Chronic systolic dysfunction of left ventricle   . CKD stage G4/A1, GFR 15-29 and albumin  creatinine ratio <30 mg/g (HCC) 05/17/2019  . Coronary artery disease   . Diabetes mellitus without complication ([place])   . Diverticulitis   . History of MI (myocardial infarction) 08/16/2018  . HTN (hypertension) 08/16/2018  . Hyperlipidemia associated with type 2 diabetes mellitus ([place]) 08/16/2018  . Hypertension   . Hypothyroid 08/16/2018  . Hypothyroidism   . Malignant neoplasm of upper-outer quadrant of left breast in female, estrogen receptor positive ([place]) 03/02/2019  . Mitral regurgitation moderate to severe based on echocardiogram in summer 2020 11/04/2018  . Myocardial infarction ([place])   . Pacemaker 06/29/2019  . Paroxysmal atrial fibrillation (HCC)   . Pleural effusion on left 06/29/2019  . Sinus bradycardia 06/17/2019  . Thyroid disease   . Transaminitis 06/17/2019  . Type 2 diabetes mellitus with other circulatory complications ([place]) 7/61/9509  . V-tach [facility]) 06/17/2019    Past Surgical History:  Procedure Laterality Date  . APPENDECTOMY  1936  . ICD IMPLANT N/A 06/20/2019   Procedure: ICD IMPLANT;  Surgeon: Lauren Lance, MD;  Location: [facility];  Service: Cardiovascular;  Laterality: N/A;  . IR THORACENTESIS ASP PLEURAL SPACE W/IMG GUIDE  05/27/2019  . PARS PLANA VITRECTOMY Right 12/04/2018   Procedure: RETINAL DETACHMENT REPAIR PPV 25 GAUGE WITH ENDO LASER AIR/FLUID EXCHANGE SF6 GAS INJECTION;  Surgeon: Lauren Mullet, MD;  Location: [place];  Service: Ophthalmology;  Laterality: Right;  . TONSILLECTOMY      Current Medications: Current Meds  Medication Sig  . acetaminophen (TYLENOL) 650 MG CR tablet Take 650 mg by  mouth every 8 (eight) hours as needed for pain.  Marland Kitchen anastrozole (ARIMIDEX) 1 MG tablet TAKE 1 TABLET BY MOUTH EVERY DAY (Patient taking differently: Take 1 mg by mouth at bedtime. )  . apixaban (ELIQUIS) 2.5 MG TABS tablet Take 1 tablet (2.5 mg total) by mouth 2 (two) times daily.  Marland Kitchen aspirin EC 81 MG tablet Take 81 mg by mouth daily.  Marland Kitchen atorvastatin  (LIPITOR) 40 MG tablet Take 1 tablet (40 mg total) by mouth daily. (Patient taking differently: Take 40 mg by mouth at bedtime. )  . Azelastine-Fluticasone 137-50 MCG/ACT SUSP Place 1 spray into both nostrils 2 (two) times daily.   Marland Kitchen b complex vitamins capsule Take 1 capsule by mouth daily.  . Biotin 1 MG CAPS Take 1 mg by mouth daily.   . carvedilol (COREG) 3.125 MG tablet Take 1 tablet (3.125 mg total) by mouth 2 (two) times daily with a meal.  . Cholecalciferol (VITAMIN D3) 50 MCG (2000 UT) TABS Take 2,000 Units by mouth at bedtime.   . Ferrous Sulfate (IRON) 325 (65 Fe) MG TABS Take 325 mg by mouth daily with breakfast.   . fluticasone (FLONASE) 50 MCG/ACT nasal spray SPRAY 2 SPRAYS INTO EACH NOSTRIL EVERY DAY (Patient taking differently: Place 2 sprays into both nostrils at bedtime as needed for allergies or rhinitis. )  . isosorbide dinitrate (ISORDIL) 40 MG tablet Take 1 tablet (40 mg total) by mouth 2 (two) times daily.  Marland Kitchen levothyroxine (SYNTHROID) 150 MCG tablet Take 1 tablet (150 mcg total) by mouth daily before breakfast.  . magnesium oxide (MAG-OX) 400 MG tablet Take 400 mg by mouth at bedtime.   . nitroGLYCERIN (NITROSTAT) 0.4 MG SL tablet Place 0.4 mg under the tongue every 5 (five) minutes as needed for chest pain.  Marland Kitchen quinapril (ACCUPRIL) 10 MG tablet Take 1 tablet (10 mg total) by mouth 2 (two) times daily.  Marland Kitchen torsemide (DEMADEX) 20 MG tablet Take 1 tablet (20 mg total) by mouth daily.  . vitamin C (ASCORBIC ACID) 500 MG tablet Take 500 mg by mouth daily.     Allergies:   Amiodarone and Tape   Social History   Socioeconomic History  . Marital status: Married    Spouse name: Not on file  . Number of children: Not on file  . Years of education: Not on file  . Highest education level: Not on file  Occupational History  . Not on file  Tobacco Use  . Smoking status: Former Research scientist (life sciences)  . Smokeless tobacco: Never Used  Substance and Sexual Activity  . Alcohol use: Yes     Comment: Very rare  . Drug use: Not Currently  . Sexual activity: Not Currently  Other Topics Concern  . Not on file  Social History Narrative  . Not on file   Social Determinants of Health   Financial Resource Strain:   . Difficulty of Paying Living Expenses:   Food Insecurity:   . Worried About Charity fundraiser in the Last Year:   . Arboriculturist in the Last Year:   Transportation Needs:   . Film/video editor (Medical):   Marland Kitchen Lack of Transportation (Non-Medical):   Physical Activity:   . Days of Exercise per Week:   . Minutes of Exercise per Session:   Stress:   . Feeling of Stress :   Social Connections:   . Frequency of Communication with Friends and Family:   . Frequency of Social Gatherings with Friends  and Family:   . Attends Religious Services:   . Active Member of Clubs or Organizations:   . Attends Archivist Meetings:   Marland Kitchen Marital Status:      Family History: The patient's family history includes Arthritis in her daughter, father, mother, and son; Depression in her maternal aunt; Heart attack in her father; Heart disease in her father and mother; Hyperlipidemia in her maternal aunt; Hypertension in her maternal aunt and mother. ROS:   Please see the history of present illness.    All 14 point review of systems negative except as described per history of present illness  EKGs/Labs/Other Studies Reviewed:      Recent Labs: 05/25/2019: TSH 1.130 06/17/2019: Magnesium 2.4 06/24/2019: ALT 17; B Natriuretic Peptide 1,487.1 06/29/2019: BUN 48; Creatinine, Ser 1.77; Hemoglobin 9.8; Platelets 256.0; Potassium 4.1; Pro B Natriuretic peptide (BNP) 1,689.0; Sodium 140  Recent Lipid Panel    Component Value Date/Time   CHOL 109 12/29/2018 1415   TRIG 154.0 (H) 12/29/2018 1415   HDL 48.40 12/29/2018 1415   CHOLHDL 2 12/29/2018 1415   VLDL 30.8 12/29/2018 1415   LDLCALC 30 12/29/2018 1415    Physical Exam:    VS:  BP 138/70   Pulse 64   Ht 5\' 4"   (1.626 m)   Wt 161 lb (73 kg)   SpO2 95%   BMI 27.64 kg/m     Wt Readings from Last 3 Encounters:  07/06/19 161 lb (73 kg)  06/29/19 166 lb 6 oz (75.5 kg)  06/24/19 174 lb 3.2 oz (79 kg)     GEN:  Well nourished, well developed in no acute distress HEENT: Normal NECK: No JVD; No carotid bruits LYMPHATICS: No lymphadenopathy CARDIAC: RRR, no murmurs, no rubs, no gallops RESPIRATORY:  Clear to auscultation without rales, wheezing or rhonchi  ABDOMEN: Soft, non-tender, non-distended MUSCULOSKELETAL:  No edema; No deformity  SKIN: Warm and dry LOWER EXTREMITIES: no swelling NEUROLOGIC:  Alert and oriented x 3 PSYCHIATRIC:  Normal affect   ASSESSMENT:    1. Acute combined systolic and diastolic congestive heart failure ([place])   2. Essential hypertension   3. Nonrheumatic mitral valve regurgitation   4. Coronary artery disease involving native coronary artery of native heart without angina pectoris   5. V-tach ([place])   6. CKD stage G4/A1, GFR 15-29 and albumin creatinine ratio <30 mg/g (HCC)   7. ICD (implantable cardioverter-defibrillator) in place    PLAN:    In order of problems listed above:  1. Combined systolic and diastolic congestive heart failure.  I will check proBNP today as well as Chem-7.  My physical exam she seems to be compensated.  I will continue present dose of diuretic. 2. Essential hypertension blood pressure well controlled continue present management. 3. Mitral valve regurgitation the key is to try to manage her medically the best we can and then reevaluate her for possible intervention of the valve, however, I hope that with improvement left ventricle ejection fraction and reducing her blood pressure she will improve to the point that no intervention will be needed. 4. Coronary artery disease no recent evaluation has been done.  Cardiac catheterization was contemplated but because of her comorbidities namely kidney dysfunction that strategy was abandoned.   She seems to be doing well right now. 5. History of V. tach required cardioversion on amiodarone.  Now amiodarone has been withdrawn.  I did carefully review record I still cannot find a clear-cut reason for that.  Michela Pitcher that she  was allergic to amiodarone and one of the reason was prolonged QT interval.  I will make arrangements for her device to be interrogated overnight to see if she got any tachyarrhythmia she felt palpitations yesterday.  I will wait for Chem-7 to decide about intensification of her diuretic.  I see her back in about a month.   Medication Adjustments/Labs and Tests Ordered: Current medicines are reviewed at length with the patient today.  Concerns regarding medicines are outlined above.  Orders Placed This Encounter  Procedures  . Pro b natriuretic peptide (BNP)  . Basic metabolic panel   Medication changes: No orders of the defined types were placed in this encounter.   Signed, Park Liter, MD, [facility] 07/06/2019 2:53 PM    [place]

## 2019-07-06 NOTE — Telephone Encounter (Signed)
Spoke with patient to assist with ICD transmission. Pt reports she does not have much time right now and requests call back tomorrow for assistance. Will call back tomorrow as requested.

## 2019-07-06 NOTE — Telephone Encounter (Signed)
-----   Message from Stanton Kidney, RN sent at 07/06/2019  4:26 PM EDT -----  ----- Message ----- From: Park Liter, MD Sent: 07/06/2019   3:00 PM EDT To: Stanton Kidney, RN  Sheri, can you check remotely this pt device, had palpitations yesterday, hx of v.tach, now off amiodarone  Thanks Herbie [place]

## 2019-07-07 LAB — BASIC METABOLIC PANEL
BUN/Creatinine Ratio: 25 (ref 12–28)
BUN: 54 mg/dL — ABNORMAL HIGH (ref 8–27)
CO2: 25 mmol/L (ref 20–29)
Calcium: 9.2 mg/dL (ref 8.7–10.3)
Chloride: 100 mmol/L (ref 96–106)
Creatinine, Ser: 2.2 mg/dL — ABNORMAL HIGH (ref 0.57–1.00)
GFR calc Af Amer: 22 mL/min/{1.73_m2} — ABNORMAL LOW (ref >59)
GFR calc non Af Amer: 19 mL/min/{1.73_m2} — ABNORMAL LOW (ref >59)
Glucose: 125 mg/dL — ABNORMAL HIGH (ref 65–99)
Potassium: 4.7 mmol/L (ref 3.5–5.2)
Sodium: 140 mmol/L (ref 134–144)

## 2019-07-07 LAB — PRO B NATRIURETIC PEPTIDE: NT-Pro BNP: 12032 pg/mL — ABNORMAL HIGH (ref 0–738)

## 2019-07-07 NOTE — Telephone Encounter (Signed)
Assisted patient with sending manual transmission.  Pt had questions about dressing on ICD implant site and when they will be removed.  Advised they are removed at wound check appt which she had cancelled and rescheduled from 5/13 to 5/27.  Pt denies any s/s of infection and was asking d/t strips pulling at edges.

## 2019-07-08 ENCOUNTER — Other Ambulatory Visit: Payer: Medicare Other

## 2019-07-08 ENCOUNTER — Ambulatory Visit: Payer: Medicare Other

## 2019-07-08 NOTE — Telephone Encounter (Signed)
Thank you for doing it.  It looks like she did not have any detection on her device and normal function.

## 2019-07-12 ENCOUNTER — Telehealth: Payer: Self-pay | Admitting: Emergency Medicine

## 2019-07-12 DIAGNOSIS — I5041 Acute combined systolic (congestive) and diastolic (congestive) heart failure: Secondary | ICD-10-CM

## 2019-07-12 DIAGNOSIS — N184 Chronic kidney disease, stage 4 (severe): Secondary | ICD-10-CM

## 2019-07-12 NOTE — Telephone Encounter (Signed)
Called patient informed her of lab results and that Dr. Agustin Cree advised that she come back and have labs repeated she verbally understands and plans to come this week.

## 2019-07-13 ENCOUNTER — Other Ambulatory Visit: Payer: Self-pay | Admitting: Family Medicine

## 2019-07-14 ENCOUNTER — Other Ambulatory Visit: Payer: Self-pay

## 2019-07-14 ENCOUNTER — Ambulatory Visit (INDEPENDENT_AMBULATORY_CARE_PROVIDER_SITE_OTHER): Payer: Medicare Other | Admitting: Emergency Medicine

## 2019-07-14 DIAGNOSIS — Z95810 Presence of automatic (implantable) cardiac defibrillator: Secondary | ICD-10-CM | POA: Diagnosis not present

## 2019-07-14 DIAGNOSIS — I472 Ventricular tachycardia, unspecified: Secondary | ICD-10-CM

## 2019-07-14 LAB — CUP PACEART INCLINIC DEVICE CHECK
Battery Remaining Longevity: 72 mo
Brady Statistic RA Percent Paced: 95 %
Brady Statistic RV Percent Paced: 16 %
Date Time Interrogation Session: 20210527151350
HighPow Impedance: 63 Ohm
Implantable Lead Implant Date: 20210503
Implantable Lead Implant Date: 20210503
Implantable Lead Location: 753859
Implantable Lead Location: 753860
Implantable Pulse Generator Implant Date: 20210503
Lead Channel Impedance Value: 437.5 Ohm
Lead Channel Impedance Value: 500 Ohm
Lead Channel Pacing Threshold Amplitude: 0.75 V
Lead Channel Pacing Threshold Amplitude: 0.75 V
Lead Channel Pacing Threshold Pulse Width: 0.5 ms
Lead Channel Pacing Threshold Pulse Width: 0.5 ms
Lead Channel Sensing Intrinsic Amplitude: 1.3 mV
Lead Channel Sensing Intrinsic Amplitude: 12 mV
Lead Channel Setting Pacing Amplitude: 3.5 V
Lead Channel Setting Pacing Amplitude: 3.5 V
Lead Channel Setting Pacing Pulse Width: 0.5 ms
Lead Channel Setting Sensing Sensitivity: 0.5 mV
Pulse Gen Serial Number: 111019603

## 2019-07-14 LAB — BASIC METABOLIC PANEL
BUN/Creatinine Ratio: 30 — ABNORMAL HIGH (ref 12–28)
BUN: 58 mg/dL — ABNORMAL HIGH (ref 8–27)
CO2: 25 mmol/L (ref 20–29)
Calcium: 9.4 mg/dL (ref 8.7–10.3)
Chloride: 101 mmol/L (ref 96–106)
Creatinine, Ser: 1.92 mg/dL — ABNORMAL HIGH (ref 0.57–1.00)
GFR calc Af Amer: 26 mL/min/{1.73_m2} — ABNORMAL LOW (ref >59)
GFR calc non Af Amer: 23 mL/min/{1.73_m2} — ABNORMAL LOW (ref >59)
Glucose: 210 mg/dL — ABNORMAL HIGH (ref 65–99)
Potassium: 4.6 mmol/L (ref 3.5–5.2)
Sodium: 141 mmol/L (ref 134–144)

## 2019-07-14 LAB — PRO B NATRIURETIC PEPTIDE: NT-Pro BNP: 11497 pg/mL — ABNORMAL HIGH (ref 0–738)

## 2019-07-14 NOTE — Progress Notes (Signed)
Wound check appointment. Steri-strips removed. Wound without redness or edema. Incision edges approximated, wound well healed. Normal device function. Thresholds, sensing, and impedances consistent with implant measurements. Device programmed at 3.5V for extra safety margin until 3 month visit. Histogram distribution appropriate for patient and level of activity. No mode switches or ventricular arrhythmias noted. Patient educated about wound care, arm mobility, lifting restrictions, shock plan. ROV with Dr Lovena Le on 09/23/19/. Next remote transmission 09/20/19 and then every 91 days.

## 2019-07-19 ENCOUNTER — Other Ambulatory Visit: Payer: Self-pay | Admitting: Family Medicine

## 2019-07-19 ENCOUNTER — Telehealth: Payer: Self-pay | Admitting: Cardiology

## 2019-07-19 NOTE — Telephone Encounter (Signed)
Patient called stating that her and Dr. Drue Dun had discussed on possibly changing her medications. She has some medication that she needs refills on but she doesn't want to pick them up from the pharmacy if he is going to change her medications.  Please advise.

## 2019-07-19 NOTE — Telephone Encounter (Signed)
I would not change any of her medication right now please continue present management

## 2019-07-20 ENCOUNTER — Telehealth: Payer: Self-pay

## 2019-07-20 NOTE — Telephone Encounter (Signed)
Spoke with patient regarding results and recommendation.  Patient verbalizes understanding and is agreeable to plan of care. Advised patient to call back with any issues or concerns.  

## 2019-07-20 NOTE — Telephone Encounter (Signed)
-----   Message from Park Liter, MD sent at 07/19/2019  6:26 PM EDT ----- Kidney function improved, markers for congestive heart improve as well.

## 2019-07-20 NOTE — Telephone Encounter (Signed)
Spoke to the patient just now and let her know that Dr. Agustin Cree said to just continue with her current medications at this time. She verbalizes understanding and does not have any other issues or concerns.    Encouraged patient to call back with any questions or concerns.

## 2019-07-25 ENCOUNTER — Other Ambulatory Visit: Payer: Self-pay | Admitting: Oncology

## 2019-07-26 ENCOUNTER — Telehealth: Payer: Self-pay | Admitting: Family Medicine

## 2019-07-26 ENCOUNTER — Other Ambulatory Visit: Payer: Self-pay | Admitting: Family Medicine

## 2019-07-26 ENCOUNTER — Telehealth: Payer: Self-pay | Admitting: Oncology

## 2019-07-26 NOTE — Telephone Encounter (Signed)
R/s appt per 6/7 sch message - unable to reach pt - left  Message for patient with new appt date and time

## 2019-07-26 NOTE — Telephone Encounter (Signed)
I sent a message to [facility] to check on the status of this.

## 2019-07-26 NOTE — Telephone Encounter (Signed)
Patient is calling about a walker that Dr. Birdie Riddle had ordered for her in May - No one has contacted patient about this.  Please call patient back

## 2019-07-27 NOTE — Telephone Encounter (Signed)
Pt called back again today stating that no one called her about the walker. I'm happy to call [facility] for the pt please provide me a contact #   Pt can be reached at the home #

## 2019-07-27 NOTE — Telephone Encounter (Signed)
Called pt and informed that [facility] advised me this morning that they were working on the walker.

## 2019-07-29 ENCOUNTER — Other Ambulatory Visit: Payer: Medicare Other

## 2019-07-29 ENCOUNTER — Ambulatory Visit: Payer: Medicare Other

## 2019-08-03 ENCOUNTER — Encounter: Payer: Self-pay | Admitting: Cardiology

## 2019-08-03 ENCOUNTER — Other Ambulatory Visit: Payer: Self-pay

## 2019-08-03 ENCOUNTER — Ambulatory Visit (INDEPENDENT_AMBULATORY_CARE_PROVIDER_SITE_OTHER): Payer: Medicare Other | Admitting: Cardiology

## 2019-08-03 VITALS — BP 144/70 | HR 60 | Ht 64.0 in | Wt 160.0 lb

## 2019-08-03 DIAGNOSIS — I340 Nonrheumatic mitral (valve) insufficiency: Secondary | ICD-10-CM | POA: Diagnosis not present

## 2019-08-03 DIAGNOSIS — I472 Ventricular tachycardia, unspecified: Secondary | ICD-10-CM

## 2019-08-03 DIAGNOSIS — E1169 Type 2 diabetes mellitus with other specified complication: Secondary | ICD-10-CM

## 2019-08-03 DIAGNOSIS — I10 Essential (primary) hypertension: Secondary | ICD-10-CM

## 2019-08-03 DIAGNOSIS — E785 Hyperlipidemia, unspecified: Secondary | ICD-10-CM

## 2019-08-03 DIAGNOSIS — I2510 Atherosclerotic heart disease of native coronary artery without angina pectoris: Secondary | ICD-10-CM

## 2019-08-03 DIAGNOSIS — N184 Chronic kidney disease, stage 4 (severe): Secondary | ICD-10-CM

## 2019-08-03 DIAGNOSIS — I5041 Acute combined systolic (congestive) and diastolic (congestive) heart failure: Secondary | ICD-10-CM

## 2019-08-03 NOTE — Progress Notes (Signed)
Cardiology Office Note:    Date:  08/03/2019   ID:  Lauren Walker, DOB February 06, 1930, MRN [id number]  PCP:  Midge Minium, MD  Cardiologist:  Jenne Campus, MD    Referring MD: Midge Minium, MD   Chief Complaint  Patient presents with  . Follow-up  I am doing fine  History of Present Illness:    Lauren Walker is a 86 y.o. female   with past medical history significant for breast cancer only on hormonal therapy, chronic both systolic and diastolic congestive heart failure, remote history of coronary artery disease, paroxysmal atrial fibrillation, moderate to severe mitral regurgitation, kidney failure with creatinine did improve up to 2.2, essential hypertension dyslipidemia hypothyroidism.  Recently she presented to hospital because of syncopal episode she was find to have ventricular tachycardia, that was managed with amiodarone as well as cardioversion, eventually she ended up receiving ICD.  There was also some conversation about potentially doing cardiac catheterization but it never happened because of her kidney dysfunction.  She comes today to my office for follow-up.  She is doing better.  She has more energy.  Few days ago she ended going to the emergency room because of shortness of breath.   She comes today to my office for follow-up.  Overall she says she is feeling better the biggest problem that she has is the pain in the left hip and she cannot walk because of the pain.  Denies have any discharge from the defibrillator she does describe some twinges around ICD placed but otherwise doing fine.  No dizziness no syncope no passing out no palpitations.  Past Medical History:  Diagnosis Date  . Acute CHF (congestive heart failure) ([place]) 05/25/2019  . Anemia associated with stage 4 chronic renal failure ([place]) 05/17/2019  . Aortic stenosis 02/14/2019  . Arthritis   . CAD (coronary artery disease) 08/16/2018  . Chronic kidney disease   . Chronic systolic dysfunction of  left ventricle   . CKD stage G4/A1, GFR 15-29 and albumin creatinine ratio <30 mg/g (HCC) 05/17/2019  . Coronary artery disease   . Diabetes mellitus without complication ([place])   . Diverticulitis   . History of MI (myocardial infarction) 08/16/2018  . HTN (hypertension) 08/16/2018  . Hyperlipidemia associated with type 2 diabetes mellitus ([place]) 08/16/2018  . Hypertension   . Hypothyroid 08/16/2018  . Hypothyroidism   . ICD (implantable cardioverter-defibrillator) in place 07/06/2019  . Malignant neoplasm of upper-outer quadrant of left breast in female, estrogen receptor positive ([place]) 03/02/2019  . Mitral regurgitation moderate to severe based on echocardiogram in summer 2020 11/04/2018  . Myocardial infarction ([place])   . Pacemaker 06/29/2019  . Paroxysmal atrial fibrillation (HCC)   . Pleural effusion on left 06/29/2019  . Sinus bradycardia 06/17/2019  . Thyroid disease   . Transaminitis 06/17/2019  . Type 2 diabetes mellitus with other circulatory complications ([place]) 8/65/7846  . V-tach [facility]) 06/17/2019    Past Surgical History:  Procedure Laterality Date  . APPENDECTOMY  1936  . ICD IMPLANT N/A 06/20/2019   Procedure: ICD IMPLANT;  Surgeon: Evans Lance, MD;  Location: [facility];  Service: Cardiovascular;  Laterality: N/A;  . IR THORACENTESIS ASP PLEURAL SPACE W/IMG GUIDE  05/27/2019  . PARS PLANA VITRECTOMY Right 12/04/2018   Procedure: RETINAL DETACHMENT REPAIR PPV 25 GAUGE WITH ENDO LASER AIR/FLUID EXCHANGE SF6 GAS INJECTION;  Surgeon: Jalene Mullet, MD;  Location: [facility];  Service: Ophthalmology;  Laterality: Right;  . TONSILLECTOMY  Current Medications: Current Meds  Medication Sig  . acetaminophen (TYLENOL) 650 MG CR tablet Take 650 mg by mouth every 8 (eight) hours as needed for pain.  Marland Kitchen anastrozole (ARIMIDEX) 1 MG tablet TAKE 1 TABLET BY MOUTH EVERY DAY (Patient taking differently: Take 1 mg by mouth at bedtime. )  . apixaban (ELIQUIS) 2.5 MG TABS tablet Take 1  tablet (2.5 mg total) by mouth 2 (two) times daily.  Marland Kitchen aspirin EC 81 MG tablet Take 81 mg by mouth daily.  Marland Kitchen atorvastatin (LIPITOR) 40 MG tablet Take 1 tablet (40 mg total) by mouth daily. (Patient taking differently: Take 40 mg by mouth at bedtime. )  . Azelastine-Fluticasone 137-50 MCG/ACT SUSP Place 1 spray into both nostrils 2 (two) times daily.   Marland Kitchen b complex vitamins capsule Take 1 capsule by mouth daily.  . Biotin 1 MG CAPS Take 1 mg by mouth daily.   . carvedilol (COREG) 3.125 MG tablet TAKE 1 TABLET (3.125 MG TOTAL) BY MOUTH 2 (TWO) TIMES DAILY WITH A MEAL.  Marland Kitchen Cholecalciferol (VITAMIN D3) 50 MCG (2000 UT) TABS Take 2,000 Units by mouth at bedtime.   . Ferrous Sulfate (IRON) 325 (65 Fe) MG TABS Take 325 mg by mouth daily with breakfast.   . fluticasone (FLONASE) 50 MCG/ACT nasal spray SPRAY 2 SPRAYS INTO EACH NOSTRIL EVERY DAY (Patient taking differently: Place 2 sprays into both nostrils at bedtime as needed for allergies or rhinitis. )  . isosorbide dinitrate (ISORDIL) 20 MG tablet TAKE 2 TABLETS (40MG ) BY MOUTH TWICE DAILY  . levothyroxine (SYNTHROID) 150 MCG tablet Take 1 tablet (150 mcg total) by mouth daily before breakfast.  . magnesium oxide (MAG-OX) 400 MG tablet Take 400 mg by mouth at bedtime.   . nitroGLYCERIN (NITROSTAT) 0.4 MG SL tablet Place 0.4 mg under the tongue every 5 (five) minutes as needed for chest pain.  Marland Kitchen quinapril (ACCUPRIL) 10 MG tablet Take 1 tablet (10 mg total) by mouth 2 (two) times daily.  Marland Kitchen torsemide (DEMADEX) 20 MG tablet TAKE 1 TABLET BY MOUTH EVERY DAY  . vitamin C (ASCORBIC ACID) 500 MG tablet Take 500 mg by mouth daily.     Allergies:   Amiodarone and Tape   Social History   Socioeconomic History  . Marital status: Married    Spouse name: Not on file  . Number of children: Not on file  . Years of education: Not on file  . Highest education level: Not on file  Occupational History  . Not on file  Tobacco Use  . Smoking status: Former Research scientist (life sciences)   . Smokeless tobacco: Never Used  Vaping Use  . Vaping Use: Never used  Substance and Sexual Activity  . Alcohol use: Yes    Comment: Very rare  . Drug use: Not Currently  . Sexual activity: Not Currently  Other Topics Concern  . Not on file  Social History Narrative  . Not on file   Social Determinants of Health   Financial Resource Strain:   . Difficulty of Paying Living Expenses:   Food Insecurity:   . Worried About Charity fundraiser in the Last Year:   . Arboriculturist in the Last Year:   Transportation Needs:   . Film/video editor (Medical):   Marland Kitchen Lack of Transportation (Non-Medical):   Physical Activity:   . Days of Exercise per Week:   . Minutes of Exercise per Session:   Stress:   . Feeling of Stress :  Social Connections:   . Frequency of Communication with Friends and Family:   . Frequency of Social Gatherings with Friends and Family:   . Attends Religious Services:   . Active Member of Clubs or Organizations:   . Attends Archivist Meetings:   Marland Kitchen Marital Status:      Family History: The patient's family history includes Arthritis in her daughter, father, mother, and son; Depression in her maternal aunt; Heart attack in her father; Heart disease in her father and mother; Hyperlipidemia in her maternal aunt; Hypertension in her maternal aunt and mother. ROS:   Please see the history of present illness.    All 14 point review of systems negative except as described per history of present illness  EKGs/Labs/Other Studies Reviewed:      Recent Labs: 05/25/2019: TSH 1.130 06/17/2019: Magnesium 2.4 06/24/2019: ALT 17; B Natriuretic Peptide 1,487.1 06/29/2019: Hemoglobin 9.8; Platelets 256.0 07/13/2019: BUN 58; Creatinine, Ser 1.92; NT-Pro BNP 11,497; Potassium 4.6; Sodium 141  Recent Lipid Panel    Component Value Date/Time   CHOL 109 12/29/2018 1415   TRIG 154.0 (H) 12/29/2018 1415   HDL 48.40 12/29/2018 1415   CHOLHDL 2 12/29/2018 1415    VLDL 30.8 12/29/2018 1415   LDLCALC 30 12/29/2018 1415    Physical Exam:    VS:  BP (!) 144/70   Pulse 60   Ht 5\' 4"  (1.626 m)   Wt 160 lb (72.6 kg)   SpO2 96%   BMI 27.46 kg/m     Wt Readings from Last 3 Encounters:  08/03/19 160 lb (72.6 kg)  07/06/19 161 lb (73 kg)  06/29/19 166 lb 6 oz (75.5 kg)     GEN:  Well nourished, well developed in no acute distress HEENT: Normal NECK: No JVD; No carotid bruits LYMPHATICS: No lymphadenopathy CARDIAC: RRR, holosystolic murmur grade 2/6 best heard at the apex, no rubs, no gallops RESPIRATORY:  Clear to auscultation without rales, wheezing or rhonchi  ABDOMEN: Soft, non-tender, non-distended MUSCULOSKELETAL:  No edema; No deformity  SKIN: Warm and dry LOWER EXTREMITIES: no swelling NEUROLOGIC:  Alert and oriented x 3 PSYCHIATRIC:  Normal affect   ASSESSMENT:    1. V-tach ([place])   2. Nonrheumatic mitral valve regurgitation   3. Essential hypertension   4. Coronary artery disease involving native coronary artery of native heart without angina pectoris   5. Hyperlipidemia associated with type 2 diabetes mellitus ([place])   6. CKD stage G4/A1, GFR 15-29 and albumin creatinine ratio <30 mg/g (HCC)    PLAN:    In order of problems listed above:  1. History of V. tach.  She does have ICD.  Continue monitoring. 2. Nonrheumatic mitral valve regurgitation the hope is that with proper diuresis and proper medication valve became more competent.  I will check proBNP as well as Chem-7 today to decide if we can intensify her medical therapy. 3. Essential hypertension blood pressure still slightly elevated hopefully with proper medications will be able to bring it down which we will do it depends on results of her blood test Det[place]er ordered today.  She may require some vasodilatation with hydralazine. 4. Hyperlipidemia: She is on Lipitor 40 which I will continue. 5. Chronic kidney failure: Kidney function will be checked today.   Medication  Adjustments/Labs and Tests Ordered: Current medicines are reviewed at length with the patient today.  Concerns regarding medicines are outlined above.  No orders of the defined types were placed in this encounter.  Medication changes:  No orders of the defined types were placed in this encounter.   Signed, Park Liter, MD, [facility] 08/03/2019 1:30 PM    [place]

## 2019-08-03 NOTE — Patient Instructions (Signed)
Medication Instructions:  Your physician recommends that you continue on your current medications as directed. Please refer to the Current Medication list given to you today.  *If you need a refill on your cardiac medications before your next appointment, please call your pharmacy*   Lab Work: Your physician recommends that you return for lab work today: pro bnp, bmp   If you have labs (blood work) drawn today and your tests are completely normal, you will receive your results only by: Marland Kitchen MyChart Message (if you have MyChart) OR . A paper copy in the mail If you have any lab test that is abnormal or we need to change your treatment, we will call you to review the results.   Testing/Procedures: None.    Follow-Up: At [facility], you and your health needs are our priority.  As part of our continuing mission to provide you with exceptional heart care, we have created designated Provider Care Teams.  These Care Teams include your primary Cardiologist (physician) and Advanced Practice Providers (APPs -  Physician Assistants and Nurse Practitioners) who all work together to provide you with the care you need, when you need it.  We recommend signing up for the patient portal called "MyChart".  Sign up information is provided on this After Visit Summary.  MyChart is used to connect with patients for Virtual Visits (Telemedicine).  Patients are able to view lab/test results, encounter notes, upcoming appointments, etc.  Non-urgent messages can be sent to your provider as well.   To learn more about what you can do with MyChart, go to [email or web address].    Your next appointment:   1 month(s)  The format for your next appointment:   In Person  Provider:   Jenne Campus, MD   Other Instructions

## 2019-08-04 LAB — BASIC METABOLIC PANEL
BUN/Creatinine Ratio: 30 — ABNORMAL HIGH (ref 12–28)
BUN: 54 mg/dL — ABNORMAL HIGH (ref 8–27)
CO2: 23 mmol/L (ref 20–29)
Calcium: 9.1 mg/dL (ref 8.7–10.3)
Chloride: 101 mmol/L (ref 96–106)
Creatinine, Ser: 1.82 mg/dL — ABNORMAL HIGH (ref 0.57–1.00)
GFR calc Af Amer: 28 mL/min/{1.73_m2} — ABNORMAL LOW (ref >59)
GFR calc non Af Amer: 24 mL/min/{1.73_m2} — ABNORMAL LOW (ref >59)
Glucose: 167 mg/dL — ABNORMAL HIGH (ref 65–99)
Potassium: 4.6 mmol/L (ref 3.5–5.2)
Sodium: 140 mmol/L (ref 134–144)

## 2019-08-04 LAB — PRO B NATRIURETIC PEPTIDE: NT-Pro BNP: 10016 pg/mL — ABNORMAL HIGH (ref 0–738)

## 2019-08-05 ENCOUNTER — Telehealth: Payer: Self-pay | Admitting: Cardiology

## 2019-08-05 DIAGNOSIS — I5041 Acute combined systolic (congestive) and diastolic (congestive) heart failure: Secondary | ICD-10-CM

## 2019-08-05 MED ORDER — TORSEMIDE 20 MG PO TABS: 30.0000 mg | ORAL_TABLET | Freq: Every day | ORAL | 2 refills | Status: DC

## 2019-08-05 NOTE — Telephone Encounter (Signed)
Patient states she is returning a call. Unsure who called patient.

## 2019-08-05 NOTE — Telephone Encounter (Signed)
Called patient informed her I do not see any calls going out to her. Then she proceeds to ask me about her lab results. I informed her Dr. Agustin Cree has not reviewed them yet. She also needed help getting into her mychart account I changed the password for her and informed her of this so she can sign in. She is aware we will call once he reviews results. No further questions.

## 2019-08-05 NOTE — Telephone Encounter (Signed)
Called patient. Informed her of Dr. Wendy Poet recommendation based on labs to increase torsemide to 30 mg daily and to have labs rechecked in 10 days she verbally understood. No further questions.

## 2019-08-08 ENCOUNTER — Ambulatory Visit
Admission: RE | Admit: 2019-08-08 | Discharge: 2019-08-08 | Disposition: A | Discharge status: Home / Self Care | Payer: Medicare Other | Source: Ambulatory Visit | Attending: Oncology | Admitting: Oncology

## 2019-08-08 ENCOUNTER — Other Ambulatory Visit: Payer: Self-pay | Admitting: Oncology

## 2019-08-08 ENCOUNTER — Other Ambulatory Visit: Payer: Self-pay

## 2019-08-08 DIAGNOSIS — E038 Other specified hypothyroidism: Secondary | ICD-10-CM

## 2019-08-08 DIAGNOSIS — I350 Nonrheumatic aortic (valve) stenosis: Secondary | ICD-10-CM

## 2019-08-08 DIAGNOSIS — Z170 Estrogen receptor positive status [ER+]: Secondary | ICD-10-CM

## 2019-08-08 DIAGNOSIS — I252 Old myocardial infarction: Secondary | ICD-10-CM

## 2019-08-08 DIAGNOSIS — E785 Hyperlipidemia, unspecified: Secondary | ICD-10-CM

## 2019-08-08 DIAGNOSIS — N184 Chronic kidney disease, stage 4 (severe): Secondary | ICD-10-CM

## 2019-08-08 DIAGNOSIS — E1159 Type 2 diabetes mellitus with other circulatory complications: Secondary | ICD-10-CM

## 2019-08-08 DIAGNOSIS — D631 Anemia in chronic kidney disease: Secondary | ICD-10-CM

## 2019-08-08 DIAGNOSIS — E1169 Type 2 diabetes mellitus with other specified complication: Secondary | ICD-10-CM

## 2019-08-08 DIAGNOSIS — C50412 Malignant neoplasm of upper-outer quadrant of left female breast: Secondary | ICD-10-CM

## 2019-08-10 ENCOUNTER — Ambulatory Visit: Payer: Medicare Other | Admitting: Oncology

## 2019-08-10 ENCOUNTER — Other Ambulatory Visit: Payer: Medicare Other

## 2019-08-11 ENCOUNTER — Other Ambulatory Visit: Payer: Self-pay | Admitting: Family Medicine

## 2019-08-11 ENCOUNTER — Other Ambulatory Visit: Payer: Self-pay

## 2019-08-11 ENCOUNTER — Inpatient Hospital Stay: Payer: Medicare Other | Attending: Adult Health

## 2019-08-11 ENCOUNTER — Encounter: Payer: Self-pay | Admitting: Adult Health

## 2019-08-11 ENCOUNTER — Inpatient Hospital Stay (HOSPITAL_BASED_OUTPATIENT_CLINIC_OR_DEPARTMENT_OTHER): Payer: Medicare Other | Admitting: Adult Health

## 2019-08-11 VITALS — BP 168/59 | HR 80 | Temp 98.5°F | Resp 15 | Ht 64.0 in | Wt 163.2 lb

## 2019-08-11 DIAGNOSIS — D631 Anemia in chronic kidney disease: Secondary | ICD-10-CM

## 2019-08-11 DIAGNOSIS — Z8249 Family history of ischemic heart disease and other diseases of the circulatory system: Secondary | ICD-10-CM | POA: Insufficient documentation

## 2019-08-11 DIAGNOSIS — Z7982 Long term (current) use of aspirin: Secondary | ICD-10-CM | POA: Diagnosis not present

## 2019-08-11 DIAGNOSIS — Z8261 Family history of arthritis: Secondary | ICD-10-CM | POA: Insufficient documentation

## 2019-08-11 DIAGNOSIS — N184 Chronic kidney disease, stage 4 (severe): Secondary | ICD-10-CM

## 2019-08-11 DIAGNOSIS — Z8349 Family history of other endocrine, nutritional and metabolic diseases: Secondary | ICD-10-CM | POA: Diagnosis not present

## 2019-08-11 DIAGNOSIS — C50412 Malignant neoplasm of upper-outer quadrant of left female breast: Secondary | ICD-10-CM

## 2019-08-11 DIAGNOSIS — E039 Hypothyroidism, unspecified: Secondary | ICD-10-CM | POA: Diagnosis not present

## 2019-08-11 DIAGNOSIS — I480 Paroxysmal atrial fibrillation: Secondary | ICD-10-CM | POA: Diagnosis not present

## 2019-08-11 DIAGNOSIS — Z170 Estrogen receptor positive status [ER+]: Secondary | ICD-10-CM

## 2019-08-11 DIAGNOSIS — Z79811 Long term (current) use of aromatase inhibitors: Secondary | ICD-10-CM | POA: Insufficient documentation

## 2019-08-11 DIAGNOSIS — Z7901 Long term (current) use of anticoagulants: Secondary | ICD-10-CM | POA: Insufficient documentation

## 2019-08-11 DIAGNOSIS — I110 Hypertensive heart disease with heart failure: Secondary | ICD-10-CM | POA: Diagnosis not present

## 2019-08-11 DIAGNOSIS — I350 Nonrheumatic aortic (valve) stenosis: Secondary | ICD-10-CM

## 2019-08-11 DIAGNOSIS — I2510 Atherosclerotic heart disease of native coronary artery without angina pectoris: Secondary | ICD-10-CM | POA: Diagnosis not present

## 2019-08-11 DIAGNOSIS — D649 Anemia, unspecified: Secondary | ICD-10-CM | POA: Diagnosis not present

## 2019-08-11 DIAGNOSIS — Z87891 Personal history of nicotine dependence: Secondary | ICD-10-CM | POA: Diagnosis not present

## 2019-08-11 DIAGNOSIS — Z79899 Other long term (current) drug therapy: Secondary | ICD-10-CM | POA: Insufficient documentation

## 2019-08-11 DIAGNOSIS — E785 Hyperlipidemia, unspecified: Secondary | ICD-10-CM | POA: Insufficient documentation

## 2019-08-11 DIAGNOSIS — E038 Other specified hypothyroidism: Secondary | ICD-10-CM

## 2019-08-11 DIAGNOSIS — I252 Old myocardial infarction: Secondary | ICD-10-CM | POA: Diagnosis not present

## 2019-08-11 DIAGNOSIS — E1169 Type 2 diabetes mellitus with other specified complication: Secondary | ICD-10-CM

## 2019-08-11 DIAGNOSIS — E1159 Type 2 diabetes mellitus with other circulatory complications: Secondary | ICD-10-CM

## 2019-08-11 DIAGNOSIS — E119 Type 2 diabetes mellitus without complications: Secondary | ICD-10-CM | POA: Insufficient documentation

## 2019-08-11 DIAGNOSIS — I509 Heart failure, unspecified: Secondary | ICD-10-CM | POA: Insufficient documentation

## 2019-08-11 LAB — CBC WITH DIFFERENTIAL/PLATELET
Abs Immature Granulocytes: 0.09 10*3/uL — ABNORMAL HIGH (ref 0.00–0.07)
Basophils Absolute: 0.1 10*3/uL (ref 0.0–0.1)
Basophils Relative: 1 %
Eosinophils Absolute: 0.3 10*3/uL (ref 0.0–0.5)
Eosinophils Relative: 4 %
HCT: 31 % — ABNORMAL LOW (ref 36.0–46.0)
Hemoglobin: 9.8 g/dL — ABNORMAL LOW (ref 12.0–15.0)
Immature Granulocytes: 1 %
Lymphocytes Relative: 21 %
Lymphs Abs: 1.6 10*3/uL (ref 0.7–4.0)
MCH: 30.4 pg (ref 26.0–34.0)
MCHC: 31.6 g/dL (ref 30.0–36.0)
MCV: 96.3 fL (ref 80.0–100.0)
Monocytes Absolute: 0.7 10*3/uL (ref 0.1–1.0)
Monocytes Relative: 9 %
Neutro Abs: 4.8 10*3/uL (ref 1.7–7.7)
Neutrophils Relative %: 64 %
Platelets: 223 10*3/uL (ref 150–400)
RBC: 3.22 MIL/uL — ABNORMAL LOW (ref 3.87–5.11)
RDW: 13.4 % (ref 11.5–15.5)
WBC: 7.5 10*3/uL (ref 4.0–10.5)
nRBC: 0 % (ref 0.0–0.2)

## 2019-08-11 LAB — FERRITIN: Ferritin: 49 ng/mL (ref 11–307)

## 2019-08-11 LAB — RETICULOCYTES
Immature Retic Fract: 8.9 % (ref 2.3–15.9)
RBC.: 3.18 MIL/uL — ABNORMAL LOW (ref 3.87–5.11)
Retic Count, Absolute: 75.4 10*3/uL (ref 19.0–186.0)
Retic Ct Pct: 2.4 % (ref 0.4–3.1)

## 2019-08-11 NOTE — Progress Notes (Signed)
[place]  [phone] [phone]     ID: [name] DOB: 07/25/1929  MR#: [id number]  JKD#:[id number]  Patient Care Team: Lauren Minium, MD as PCP - General (Family Medicine) Lauren Liter, MD as PCP - Cardiology (Cardiology) Lauren Liter, MD as Consulting Physician (Cardiology) Lauren Mullet, MD as Consulting Physician (Ophthalmology) Lauren Lips, MD as Consulting Physician (Nephrology) Lauren Kaufmann, RN as Oncology Nurse Navigator Lauren Germany, RN as Oncology Nurse Navigator Lauren Walker, Lauren Dad, MD as Consulting Physician (Oncology) Lauren Kussmaul, MD as Consulting Physician (General Surgery) Lauren Dock, NP OTHER MD:  CHIEF COMPLAINT: estrogen receptor positive breast cancer  CURRENT TREATMENT: anastrozole; definitive surgery pending   INTERVAL HISTORY: Lauren Walker returns today for follow up of her estrogen receptor positive breast cancer accompanied by her granddaughter Lauren Walker.. She was evaluated in the breast cancer clinic on 03/16/2019.  She was started on anastrozole at that time due to anticipated surgical delays. She notes that she never heard from surgery, and due to this, she continued on Anastrozole, and is tolerating it quite well.  She has decided to delay surgery as long as possible.    Lauren Walker was also started on Aranesp.  She has a heart condition, and the day she received Aranesp, she had a cardiac arrest.  Due to this event, she would prefer to not receive Aranesp again.    Lauren Walker underwent mammogram and ultrasound to f/u her breast cancer on 08/08/2019.  This shows a continued decrease in the breast cancer in her breast.    REVIEW OF SYSTEMS: Lauren Walker is otherwise feeling well.  She was hospitalized after her cardiac arrest and had an ICD placement performed.  She denies any new issues such as fever, chills, chest pain, palpitations, cough, shortness of breath, headaches, nausea, vomiting,  arthralgias, vaginal dryness, or significant hot flashes.  She remains active and a detailed ROS was otherwise non contributory.    HISTORY OF CURRENT ILLNESS: From the original intake note:  Lauren Walker had routine screening mammography on 01/10/2019 showing a possible abnormality in the left breast. She underwent left diagnostic mammography with tomography and left breast ultrasonography at [facility] on 01/26/2019 showing: breast density category B; there was a 1.1 cm left breast mass at 2 o'clock 8 cm from the nipple with irregular mixed echogenicity and internal blood flow.  There were two adjacent, oval masses measuring 1 cm and 1.2 cm; taking all 3 masses together totaled approximately 2 cm.  Left axilla is negative for adenopathy.  Accordingly on 02/23/2019 she proceeded to biopsy of the 2:00 o'clock left breast area in question. The pathology from this procedure ([id number]) showed: invasive ductal carcinoma with extracellular mucin, grade 2, with focal ductal carcinoma in situ. Prognostic indicators significant for: estrogen receptor, 100% positive and progesterone receptor, 100% positive, both with strong staining intensity. Proliferation marker Ki67 at 5%. HER2 negative by immunohistochemistry (1+).  The patient's subsequent history is as detailed below.   PAST MEDICAL HISTORY: Past Medical History:  Diagnosis Date  . Acute CHF (congestive heart failure) ([place]) 05/25/2019  . Anemia associated with stage 4 chronic renal failure ([place]) 05/17/2019  . Aortic stenosis 02/14/2019  . Arthritis   . CAD (coronary artery disease) 08/16/2018  . Chronic kidney disease   . Chronic systolic dysfunction of left ventricle   . CKD stage G4/A1, GFR 15-29 and albumin creatinine ratio <30 mg/g ([facility]) 05/17/2019  . Coronary artery disease   . Diabetes mellitus  without complication ([place])   . Diverticulitis   . History of MI (myocardial infarction) 08/16/2018  . HTN (hypertension) 08/16/2018  . Hyperlipidemia  associated with type 2 diabetes mellitus ([place]) 08/16/2018  . Hypertension   . Hypothyroid 08/16/2018  . Hypothyroidism   . ICD (implantable cardioverter-defibrillator) in place 07/06/2019  . Malignant neoplasm of upper-outer quadrant of left breast in female, estrogen receptor positive ([place]) 03/02/2019  . Mitral regurgitation moderate to severe based on echocardiogram in summer 2020 11/04/2018  . Myocardial infarction ([place])   . Pacemaker 06/29/2019  . Paroxysmal atrial fibrillation (H[facility])   . Pleural effusion on left 06/29/2019  . Sinus bradycardia 06/17/2019  . Thyroid disease   . Transaminitis 06/17/2019  . Type 2 diabetes mellitus with other circulatory complications ([place]) 0/35/0093  . V-tach [facility]) 06/17/2019    PAST SURGICAL HISTORY: Past Surgical History:  Procedure Laterality Date  . APPENDECTOMY  1936  . ICD IMPLANT N/A 06/20/2019   Procedure: ICD IMPLANT;  Surgeon: Evans Lance, MD;  Location: [facility];  Service: Cardiovascular;  Laterality: N/A;  . IR THORACENTESIS ASP PLEURAL SPACE W/IMG GUIDE  05/27/2019  . PARS PLANA VITRECTOMY Right 12/04/2018   Procedure: RETINAL DETACHMENT REPAIR PPV 25 GAUGE WITH ENDO LASER AIR/FLUID EXCHANGE SF6 GAS INJECTION;  Surgeon: Lauren Mullet, MD;  Location: [facility];  Service: Ophthalmology;  Laterality: Right;  . TONSILLECTOMY      FAMILY HISTORY: Family History  Problem Relation Age of Onset  . Arthritis Mother   . Heart disease Mother   . Hypertension Mother   . Arthritis Father   . Heart attack Father   . Heart disease Father   . Arthritis Daughter   . Arthritis Son   . Depression Maternal Aunt   . Hyperlipidemia Maternal Aunt   . Hypertension Maternal Aunt    The patient's father died at age 8 from cardiac problems.  The patient's mother died at age 60 from sepsis.  The patient had no brothers or sisters.  1 maternal cousin was diagnosed with breast cancer in her 76s.  There is no other breast ovarian pancreatic or prostate  cancer in the family to the patient's knowledge   GYNECOLOGIC HISTORY:  No LMP recorded. Patient is postmenopausal. Menarche: 86 years old Age at first live birth: 86 years old [place] P 2 LMP age 40 Contraceptive oral contraceptives for approximately 2 years remotely, without complications HRT no  Hysterectomy? no BSO?  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  Her husband Lauren Walker was a Psychiatrist.  They are both retired.  They have been married 20 years (as of January 2021).  At home is just the 2 of them.  They moved to this area from [place] to be closer to their daughter Lauren Walker who works as a Environmental consultant for [employer].  Son Lauren Walker lives in [place] and works in Land.  The patient has 3 grandchildren and 1 great grandchild.  She identifies herself as Presbyterian    ADVANCED DIRECTIVES: In the absence of any documents to the contrary her husband is her healthcare power of attorney   HEALTH MAINTENANCE: Social History   Tobacco Use  . Smoking status: Former Research scientist (life sciences)  . Smokeless tobacco: Never Used  Vaping Use  . Vaping Use: Never used  Substance Use Topics  . Alcohol use: Yes    Comment: Very rare  . Drug use: Not Currently     Colonoscopy: 2019  PAP: Remote  Bone density: Remote; "normal" by patient's account   Allergies  Allergen Reactions  . Amiodarone Other (See Comments)    Prolonged QT, VT  . Tape Itching, Rash and Other (See Comments)    Reaction was from the surgical tape from cyst removed    Current Outpatient Medications  Medication Sig Dispense Refill  . acetaminophen (TYLENOL) 650 MG CR tablet Take 650 mg by mouth every 8 (eight) hours as needed for pain.    Marland Kitchen anastrozole (ARIMIDEX) 1 MG tablet TAKE 1 TABLET BY MOUTH EVERY DAY (Patient taking differently: Take 1 mg by mouth at bedtime. ) 90 tablet 0  . apixaban (ELIQUIS) 2.5 MG TABS tablet Take 1 tablet (2.5 mg total) by mouth 2 (two)  times daily. 180 tablet 3  . aspirin EC 81 MG tablet Take 81 mg by mouth daily.    Marland Kitchen atorvastatin (LIPITOR) 40 MG tablet Take 1 tablet (40 mg total) by mouth daily. (Patient taking differently: Take 40 mg by mouth at bedtime. ) 90 tablet 1  . Azelastine-Fluticasone 137-50 MCG/ACT SUSP Place 1 spray into both nostrils 2 (two) times daily.     Marland Kitchen b complex vitamins capsule Take 1 capsule by mouth daily.    . Biotin 1 MG CAPS Take 1 mg by mouth daily.     . carvedilol (COREG) 3.125 MG tablet TAKE 1 TABLET (3.125 MG TOTAL) BY MOUTH 2 (TWO) TIMES DAILY WITH A MEAL. 60 tablet 6  . Cholecalciferol (VITAMIN D3) 50 MCG (2000 UT) TABS Take 2,000 Units by mouth at bedtime.     . Ferrous Sulfate (IRON) 325 (65 Fe) MG TABS Take 325 mg by mouth daily with breakfast.     . fluticasone (FLONASE) 50 MCG/ACT nasal spray SPRAY 2 SPRAYS INTO EACH NOSTRIL EVERY DAY (Patient taking differently: Place 2 sprays into both nostrils at bedtime as needed for allergies or rhinitis. ) 48 mL 2  . levothyroxine (SYNTHROID) 150 MCG tablet Take 1 tablet (150 mcg total) by mouth daily before breakfast. 90 tablet 1  . magnesium oxide (MAG-OX) 400 MG tablet Take 400 mg by mouth at bedtime.     . nitroGLYCERIN (NITROSTAT) 0.4 MG SL tablet Place 0.4 mg under the tongue every 5 (five) minutes as needed for chest pain.    Marland Kitchen quinapril (ACCUPRIL) 10 MG tablet Take 1 tablet (10 mg total) by mouth 2 (two) times daily. 60 tablet 3  . torsemide (DEMADEX) 20 MG tablet Take 1.5 tablets (30 mg total) by mouth daily. 45 tablet 2  . vitamin C (ASCORBIC ACID) 500 MG tablet Take 500 mg by mouth daily.    . isosorbide dinitrate (ISORDIL) 20 MG tablet TAKE 2 TABLETS (40MG) BY MOUTH TWICE DAILY 120 tablet 1  . isosorbide dinitrate (ISORDIL) 40 MG tablet Take 1 tablet (40 mg total) by mouth 2 (two) times daily. 60 tablet 0   No current facility-administered medications for this visit.    OBJECTIVE: white woman who appears stated age  23:    08/11/19 1416  BP: (!) 168/59  Pulse: 80  Resp: 15  Temp: 98.5 F (36.9 C)  SpO2: 100%     Body mass index is 28.01 kg/m.   Wt Readings from Last 3 Encounters:  08/11/19 163 lb 3.2 oz (74 kg)  08/03/19 160 lb (72.6 kg)  07/06/19 161 lb (73 kg)      ECOG FS:1 - Symptomatic but completely ambulatory GENERAL: Patient is a well appearing female in no acute  distress HEENT:  Sclerae anicteric.  Mask in place. Neck is supple.  NODES:  No cervical, supraclavicular, or axillary lymphadenopathy palpated.  BREAST EXAM:  Deferred. LUNGS:  Clear to auscultation bilaterally.  No wheezes or rhonchi. HEART:  Regular rate and rhythm. No murmur appreciated. ABDOMEN:  Soft, nontender.  Positive, normoactive bowel sounds. No organomegaly palpated. MSK:  No focal spinal tenderness to palpation. Full range of motion bilaterally in the upper extremities. EXTREMITIES:  No peripheral edema.   SKIN:  Clear with no obvious rashes or skin changes. No nail dyscrasia. NEURO:  Nonfocal. Well oriented.  Appropriate affect.     LAB RESULTS:  CMP     Component Value Date/Time   NA 140 08/03/2019 1340   K 4.6 08/03/2019 1340   CL 101 08/03/2019 1340   CO2 23 08/03/2019 1340   GLUCOSE 167 (H) 08/03/2019 1340   GLUCOSE 116 (H) 06/29/2019 1111   BUN 54 (H) 08/03/2019 1340   CREATININE 1.82 (H) 08/03/2019 1340   CREATININE 2.47 (H) 05/17/2019 1453   CALCIUM 9.1 08/03/2019 1340   PROT 6.2 (L) 06/24/2019 1120   ALBUMIN 3.4 (L) 06/24/2019 1120   AST 45 (H) 06/24/2019 1120   AST 22 05/17/2019 1453   ALT 17 06/24/2019 1120   ALT 14 05/17/2019 1453   ALKPHOS 57 06/24/2019 1120   BILITOT 0.7 06/24/2019 1120   BILITOT 0.5 05/17/2019 1453   GFRNONAA 24 (L) 08/03/2019 1340   GFRNONAA 17 (L) 05/17/2019 1453   GFRAA 28 (L) 08/03/2019 1340   GFRAA 20 (L) 05/17/2019 1453    No results found for: TOTALPROTELP, ALBUMINELP, A1GS, A2GS, BETS, BETA2SER, GAMS, MSPIKE, SPEI  Lab Results  Component Value Date    WBC 7.5 08/11/2019   NEUTROABS 4.8 08/11/2019   HGB 9.8 (L) 08/11/2019   HCT 31.0 (L) 08/11/2019   MCV 96.3 08/11/2019   PLT 223 08/11/2019    No results found for: LABCA2  No components found for: TKWIOX735  No results for input(s): INR in the last 168 hours.  No results found for: LABCA2  No results found for: HGD924  No results found for: QAS341  No results found for: DQQ229  No results found for: CA2729  No components found for: HGQUANT  No results found for: CEA1 / No results found for: CEA1  No results found for: AFPTUMOR  No results found for: CHROMOGRNA  No results found for: HGBA, HGBA2QUANT, HGBFQUANT, HGBSQUAN (Hemoglobinopathy evaluation)   No results found for: LDH  Lab Results  Component Value Date   IRON 105 06/17/2019   TIBC 323 06/17/2019   IRONPCTSAT 33 06/17/2019   (Iron and TIBC)  Lab Results  Component Value Date   FERRITIN 52 06/17/2019    Urinalysis    Component Value Date/Time   COLORURINE AMBER (A) 06/18/2019 0022   APPEARANCEUR CLOUDY (A) 06/18/2019 0022   LABSPEC 1.015 06/18/2019 0022   PHURINE 5.0 06/18/2019 0022   GLUCOSEU 50 (A) 06/18/2019 0022   HGBUR LARGE (A) 06/18/2019 0022   BILIRUBINUR NEGATIVE 06/18/2019 0022   KETONESUR NEGATIVE 06/18/2019 0022   PROTEINUR 100 (A) 06/18/2019 0022   NITRITE NEGATIVE 06/18/2019 0022   LEUKOCYTESUR TRACE (A) 06/18/2019 0022     STUDIES: US BREAST LTD UNI LEFT INC AXILLA  Result Date: 08/08/2019 CLINICAL DATA:  86 year old female with known, biopsy proven left breast cancer. The patient is currently undergoing oral chemotherapy and has not had surgery. EXAM: DIGITAL DIAGNOSTIC LEFT MAMMOGRAM WITH CAD AND TOMO ULTRASOUND LEFT  BREAST COMPARISON:  Previous exam(s). ACR Breast Density Category b: There are scattered areas of fibroglandular density. FINDINGS: Targeted ultrasound is performed, showing stable to slight interval decrease in size of a irregular, hypoechoic mass at the 2:30  position 8 cm from the nipple. It measures 6 x 6 x 4 mm (previously 8 x 7 x 7 mm). Two additional circumscribed masses at the 2 o'clock position 8 cm from the nipple measure 5 x 3 10 mm and 8 x 3 x 10 mm (previously 10 x 3 x 10 mm and 10 x 4 x 10 mm). A post biopsy clip is seen in the vicinity of the masses. Additional two view mammographic images of the left breast were obtained. Post biopsy clip is seen in association with a spiculated mass in the upper outer left breast at mid to posterior depth. No additional suspicious findings are identified in the remainder of the left breast. Mammographic images were processed with CAD. IMPRESSION: Stable to slight interval decrease in size of the patient's known left breast cancer. No new or suspicious findings identified. RECOMMENDATION: Per clinical treatment plan. The patient is due for right breast screening in November 2021. I have discussed the findings and recommendations with the patient. If applicable, a reminder letter will be sent to the patient regarding the next appointment. BI-RADS CATEGORY  6: Known biopsy-proven malignancy. Electronically Signed   By: Kristopher Oppenheim M.D.   On: 08/08/2019 14:18   MM DIAG BREAST TOMO UNI LEFT  Result Date: 08/08/2019 CLINICAL DATA:  86 year old female with known, biopsy proven left breast cancer. The patient is currently undergoing oral chemotherapy and has not had surgery. EXAM: DIGITAL DIAGNOSTIC LEFT MAMMOGRAM WITH CAD AND TOMO ULTRASOUND LEFT BREAST COMPARISON:  Previous exam(s). ACR Breast Density Category b: There are scattered areas of fibroglandular density. FINDINGS: Targeted ultrasound is performed, showing stable to slight interval decrease in size of a irregular, hypoechoic mass at the 2:30 position 8 cm from the nipple. It measures 6 x 6 x 4 mm (previously 8 x 7 x 7 mm). Two additional circumscribed masses at the 2 o'clock position 8 cm from the nipple measure 5 x 3 10 mm and 8 x 3 x 10 mm (previously 10 x 3 x  10 mm and 10 x 4 x 10 mm). A post biopsy clip is seen in the vicinity of the masses. Additional two view mammographic images of the left breast were obtained. Post biopsy clip is seen in association with a spiculated mass in the upper outer left breast at mid to posterior depth. No additional suspicious findings are identified in the remainder of the left breast. Mammographic images were processed with CAD. IMPRESSION: Stable to slight interval decrease in size of the patient's known left breast cancer. No new or suspicious findings identified. RECOMMENDATION: Per clinical treatment plan. The patient is due for right breast screening in November 2021. I have discussed the findings and recommendations with the patient. If applicable, a reminder letter will be sent to the patient regarding the next appointment. BI-RADS CATEGORY  6: Known biopsy-proven malignancy. Electronically Signed   By: Kristopher Oppenheim M.D.   On: 08/08/2019 14:18   CUP PACEART INCLINIC DEVICE CHECK  Result Date: 07/14/2019 Wound check appointment. Steri-strips removed. Wound without redness or edema. Incision edges approximated, wound well healed. Normal device function. Thresholds, sensing, and impedances consistent with implant measurements. Device programmed at 3.5V for  extra safety margin until 3 month visit. Histogram distribution appropriate for patient and  level of activity. No mode switches or ventricular arrhythmias noted. Patient educated about wound care, arm mobility, lifting restrictions, shock plan. ROV with  Dr Lovena Le on 09/23/19/. Next remote transmission 09/20/19 and then every 91 days.    ELIGIBLE FOR AVAILABLE RESEARCH PROTOCOL:no  ASSESSMENT: 86 y.o. [place] woman status post left breast biopsy 02/23/2019 for a clinical T1c N0, stage IA invasive ductal carcinoma, with extracellular mucin, grade 2, estrogen and progesterone receptor positive, HER-2 not amplified, with an MIB-1 of 5%  (1) anastrozole started 03/16/2019    (2) breast conserving surgery if and when disease progression documented  (3) consider adjuvant radiation  (4) anemia of renal insufficiency  (a) to start Aranesp or bio similar 05/27/2019, repeated every 21 days   PLAN: Dannon is tolerating the Anastrozole well and her cancer is responding and shrinking to it in her breast.  She will continue this treatment.  She will undergo repeat ultrasound and mammogram in 3 months along with f/u with Dr. Cleotis Nipper after.  At that point going to every 6 month breast imaging can be discussed.    Makyla and I talked about her cardiac arrest and the EPO shot.  I have discontinued the medication from her treatment regimen, and also have put the medication in her allergy list at her request.  Her hemoglobin is stable at 9.8 today.  Eyla was recommended to continue with healthy diet and exercise.  We will see her back in 3 months as noted above.  She knows to call for any questions that may arise between now and her next appointment.  We are happy to see her sooner if needed.  Total encounter time 20 minutes.Wilber Bihari, NP 08/11/19 2:52 PM Medical Oncology and Hematology [facility] [place], [place] [address] Tel. [phone]    Fax. [phone]    *Total Encounter Time as defined by the Centers for Medicare and Medicaid Services includes, in addition to the face-to-face time of a patient visit (documented in the note above) non-face-to-face time: obtaining and reviewing outside history, ordering and reviewing medications, tests or procedures, care coordination (communications with other health care professionals or caregivers) and documentation in the medical record.

## 2019-08-11 NOTE — Telephone Encounter (Signed)
Should pt be on 20mg  BID or 40mg  BID

## 2019-08-19 ENCOUNTER — Inpatient Hospital Stay: Payer: Medicare Other | Attending: Oncology

## 2019-08-19 ENCOUNTER — Inpatient Hospital Stay: Payer: Medicare Other

## 2019-08-19 ENCOUNTER — Telehealth: Payer: Self-pay

## 2019-08-19 NOTE — Telephone Encounter (Signed)
TC to pt to see if she was coming in for her injection appointment today. Patient stated that she no longer wanted injections. Made Dr Ace Gins and nurse Val RN aware so that they could follow up with patient.

## 2019-08-24 LAB — BASIC METABOLIC PANEL
BUN/Creatinine Ratio: 37 — ABNORMAL HIGH (ref 12–28)
BUN: 81 mg/dL (ref 8–27)
CO2: 22 mmol/L (ref 20–29)
Calcium: 9.1 mg/dL (ref 8.7–10.3)
Chloride: 99 mmol/L (ref 96–106)
Creatinine, Ser: 2.17 mg/dL — ABNORMAL HIGH (ref 0.57–1.00)
GFR calc Af Amer: 23 mL/min/{1.73_m2} — ABNORMAL LOW (ref >59)
GFR calc non Af Amer: 20 mL/min/{1.73_m2} — ABNORMAL LOW (ref >59)
Glucose: 181 mg/dL — ABNORMAL HIGH (ref 65–99)
Potassium: 5 mmol/L (ref 3.5–5.2)
Sodium: 138 mmol/L (ref 134–144)

## 2019-08-24 LAB — PRO B NATRIURETIC PEPTIDE: NT-Pro BNP: 7756 pg/mL — ABNORMAL HIGH (ref 0–738)

## 2019-08-25 ENCOUNTER — Other Ambulatory Visit: Payer: Self-pay | Admitting: Family Medicine

## 2019-08-30 ENCOUNTER — Ambulatory Visit (INDEPENDENT_AMBULATORY_CARE_PROVIDER_SITE_OTHER): Payer: Medicare Other | Admitting: Podiatry

## 2019-08-30 ENCOUNTER — Encounter: Payer: Self-pay | Admitting: Podiatry

## 2019-08-30 ENCOUNTER — Other Ambulatory Visit: Payer: Self-pay

## 2019-08-30 DIAGNOSIS — M79674 Pain in right toe(s): Secondary | ICD-10-CM

## 2019-08-30 DIAGNOSIS — D649 Anemia, unspecified: Secondary | ICD-10-CM

## 2019-08-30 DIAGNOSIS — S91114A Laceration without foreign body of right lesser toe(s) without damage to nail, initial encounter: Secondary | ICD-10-CM

## 2019-08-30 DIAGNOSIS — M79675 Pain in left toe(s): Secondary | ICD-10-CM | POA: Diagnosis not present

## 2019-08-30 DIAGNOSIS — L84 Corns and callosities: Secondary | ICD-10-CM | POA: Diagnosis not present

## 2019-08-30 DIAGNOSIS — E119 Type 2 diabetes mellitus without complications: Secondary | ICD-10-CM

## 2019-08-30 DIAGNOSIS — B351 Tinea unguium: Secondary | ICD-10-CM | POA: Diagnosis not present

## 2019-09-02 ENCOUNTER — Other Ambulatory Visit: Payer: Self-pay | Admitting: Family Medicine

## 2019-09-02 ENCOUNTER — Telehealth: Payer: Self-pay | Admitting: Emergency Medicine

## 2019-09-02 DIAGNOSIS — I480 Paroxysmal atrial fibrillation: Secondary | ICD-10-CM

## 2019-09-02 DIAGNOSIS — I5041 Acute combined systolic (congestive) and diastolic (congestive) heart failure: Secondary | ICD-10-CM

## 2019-09-02 DIAGNOSIS — D649 Anemia, unspecified: Secondary | ICD-10-CM

## 2019-09-02 NOTE — Telephone Encounter (Signed)
Please advise, per my math pt should have meds until 7/24 and have 1 refill?

## 2019-09-02 NOTE — Telephone Encounter (Signed)
From what I can tell, you are correct

## 2019-09-02 NOTE — Telephone Encounter (Signed)
Spoke with patient. She wanted to know why her labs were drawn on 08/23/19 and she is just now getting the result today. I informed her that Dr. Agustin Cree has to review these before I call and he just reviewed them yesterday. She verbally understood.

## 2019-09-02 NOTE — Telephone Encounter (Signed)
Patient calling back.   °

## 2019-09-02 NOTE — Telephone Encounter (Signed)
Called patient informed her of results and to have labs redrawn. No further questions.

## 2019-09-02 NOTE — Telephone Encounter (Signed)
-----   Message from Park Liter, MD sent at 08/31/2019  9:48 PM EDT ----- Please recheck her Chem-7 as well as proBNP, CBC

## 2019-09-03 LAB — BASIC METABOLIC PANEL
BUN/Creatinine Ratio: 31 — ABNORMAL HIGH (ref 12–28)
BUN: 65 mg/dL — ABNORMAL HIGH (ref 8–27)
CO2: 24 mmol/L (ref 20–29)
Calcium: 8.8 mg/dL (ref 8.7–10.3)
Chloride: 102 mmol/L (ref 96–106)
Creatinine, Ser: 2.07 mg/dL — ABNORMAL HIGH (ref 0.57–1.00)
GFR calc Af Amer: 24 mL/min/{1.73_m2} — ABNORMAL LOW (ref >59)
GFR calc non Af Amer: 21 mL/min/{1.73_m2} — ABNORMAL LOW (ref >59)
Glucose: 135 mg/dL — ABNORMAL HIGH (ref 65–99)
Potassium: 5.1 mmol/L (ref 3.5–5.2)
Sodium: 139 mmol/L (ref 134–144)

## 2019-09-03 LAB — PRO B NATRIURETIC PEPTIDE: NT-Pro BNP: 12348 pg/mL — ABNORMAL HIGH (ref 0–738)

## 2019-09-03 NOTE — Progress Notes (Signed)
Subjective: Lauren Walker presents today for preventative diabetic visit of painful, discolored, thick toenails which interfere with daily activities.  Pain is aggravated when wearing enclosed shoe gear.   She states her dog jumped on her feet and cut the side of her right 2nd digit with it's toenail. She notes no pain, redness or swelling.  Past Medical History:  Diagnosis Date  . Acute CHF (congestive heart failure) ([place]) 05/25/2019  . Anemia associated with stage 4 chronic renal failure ([place]) 05/17/2019  . Aortic stenosis 02/14/2019  . Arthritis   . CAD (coronary artery disease) 08/16/2018  . Chronic kidney disease   . Chronic systolic dysfunction of left ventricle   . CKD stage G4/A1, GFR 15-29 and albumin creatinine ratio <30 mg/g (HCC) 05/17/2019  . Coronary artery disease   . Diabetes mellitus without complication ([place])   . Diverticulitis   . History of MI (myocardial infarction) 08/16/2018  . HTN (hypertension) 08/16/2018  . Hyperlipidemia associated with type 2 diabetes mellitus ([place]) 08/16/2018  . Hypertension   . Hypothyroid 08/16/2018  . Hypothyroidism   . ICD (implantable cardioverter-defibrillator) in place 07/06/2019  . Malignant neoplasm of upper-outer quadrant of left breast in female, estrogen receptor positive ([place]) 03/02/2019  . Mitral regurgitation moderate to severe based on echocardiogram in summer 2020 11/04/2018  . Myocardial infarction ([place])   . Pacemaker 06/29/2019  . Paroxysmal atrial fibrillation (HCC)   . Pleural effusion on left 06/29/2019  . Sinus bradycardia 06/17/2019  . Thyroid disease   . Transaminitis 06/17/2019  . Type 2 diabetes mellitus with other circulatory complications ([place]) 0/98/1191  . V-tach [facility]) 06/17/2019    Patient Active Problem List   Diagnosis Date Noted  . ICD (implantable cardioverter-defibrillator) in place 07/06/2019  . Pleural effusion on left 06/29/2019  . Pacemaker 06/29/2019  . V-tach ([place]) 06/17/2019  . Sinus bradycardia  06/17/2019  . Transaminitis 06/17/2019  . Acute CHF (congestive heart failure) ([place]) 05/25/2019  . CKD stage G4/A1, GFR 15-29 and albumin creatinine ratio <30 mg/g (HCC) 05/17/2019  . Anemia associated with stage 4 chronic renal failure ([place]) 05/17/2019  . Malignant neoplasm of upper-outer quadrant of left breast in female, estrogen receptor positive ([place]) 03/02/2019  . Aortic stenosis 02/14/2019  . Mitral regurgitation moderate to severe based on echocardiogram in summer 2020 11/04/2018  . Paroxysmal atrial fibrillation ([place]) 10/11/2018  . Hyperlipidemia associated with type 2 diabetes mellitus ([place]) 08/16/2018  . Type 2 diabetes mellitus with other circulatory complications ([place]) 47/82/9562  . Hypothyroid 08/16/2018  . HTN (hypertension) 08/16/2018  . CAD (coronary artery disease) 08/16/2018  . History of MI (myocardial infarction) 08/16/2018    Past Surgical History:  Procedure Laterality Date  . APPENDECTOMY  1936  . ICD IMPLANT N/A 06/20/2019   Procedure: ICD IMPLANT;  Surgeon: Evans Lance, MD;  Location: [facility];  Service: Cardiovascular;  Laterality: N/A;  . IR THORACENTESIS ASP PLEURAL SPACE W/IMG GUIDE  05/27/2019  . PARS PLANA VITRECTOMY Right 12/04/2018   Procedure: RETINAL DETACHMENT REPAIR PPV 25 GAUGE WITH ENDO LASER AIR/FLUID EXCHANGE SF6 GAS INJECTION;  Surgeon: Jalene Mullet, MD;  Location: [facility];  Service: Ophthalmology;  Laterality: Right;  . TONSILLECTOMY      Current Outpatient Medications on File Prior to Visit  Medication Sig Dispense Refill  . acetaminophen (TYLENOL) 650 MG CR tablet Take 650 mg by mouth every 8 (eight) hours as needed for pain.    Marland Kitchen anastrozole (ARIMIDEX) 1 MG tablet TAKE 1 TABLET  BY MOUTH EVERY DAY (Patient taking differently: Take 1 mg by mouth at bedtime. ) 90 tablet 0  . apixaban (ELIQUIS) 2.5 MG TABS tablet Take 1 tablet (2.5 mg total) by mouth 2 (two) times daily. 180 tablet 3  . aspirin EC 81 MG tablet Take 81 mg by mouth  daily.    Marland Kitchen atorvastatin (LIPITOR) 40 MG tablet Take 1 tablet (40 mg total) by mouth daily. (Patient taking differently: Take 40 mg by mouth at bedtime. ) 90 tablet 1  . Azelastine-Fluticasone 137-50 MCG/ACT SUSP Place 1 spray into both nostrils 2 (two) times daily.     Marland Kitchen b complex vitamins capsule Take 1 capsule by mouth daily.    . Biotin 1 MG CAPS Take 1 mg by mouth daily.     . carvedilol (COREG) 3.125 MG tablet TAKE 1 TABLET (3.125 MG TOTAL) BY MOUTH 2 (TWO) TIMES DAILY WITH A MEAL. 60 tablet 6  . Cholecalciferol (VITAMIN D3) 50 MCG (2000 UT) TABS Take 2,000 Units by mouth at bedtime.     . Ferrous Sulfate (IRON) 325 (65 Fe) MG TABS Take 325 mg by mouth daily with breakfast.     . fluticasone (FLONASE) 50 MCG/ACT nasal spray SPRAY 2 SPRAYS INTO EACH NOSTRIL EVERY DAY (Patient taking differently: Place 2 sprays into both nostrils at bedtime as needed for allergies or rhinitis. ) 48 mL 2  . isosorbide dinitrate (ISORDIL) 20 MG tablet TAKE 2 TABLETS (40MG ) BY MOUTH TWICE DAILY 120 tablet 1  . isosorbide dinitrate (ISORDIL) 40 MG tablet Take 1 tablet (40 mg total) by mouth 2 (two) times daily. 60 tablet 0  . levothyroxine (SYNTHROID) 150 MCG tablet TAKE 1 TABLET BY MOUTH DAILY BEFORE BREAKFAST. 90 tablet 1  . magnesium oxide (MAG-OX) 400 MG tablet Take 400 mg by mouth at bedtime.     . nitroGLYCERIN (NITROSTAT) 0.4 MG SL tablet Place 0.4 mg under the tongue every 5 (five) minutes as needed for chest pain.    Marland Kitchen quinapril (ACCUPRIL) 10 MG tablet Take 1 tablet (10 mg total) by mouth 2 (two) times daily. 60 tablet 3  . torsemide (DEMADEX) 20 MG tablet Take 1.5 tablets (30 mg total) by mouth daily. 45 tablet 2  . vitamin C (ASCORBIC ACID) 500 MG tablet Take 500 mg by mouth daily.     No current facility-administered medications on file prior to visit.     Allergies  Allergen Reactions  . Amiodarone Other (See Comments)    Prolonged QT, VT  . Aranesp (Albumin Free) [Darbepoetin Alfa] Other (See  Comments)    Patient had cardiac arrest evening after receiving this  . Tape Itching, Rash and Other (See Comments)    Reaction was from the surgical tape from cyst removed    Social History   Occupational History  . Not on file  Tobacco Use  . Smoking status: Former Research scientist (life sciences)  . Smokeless tobacco: Never Used  Vaping Use  . Vaping Use: Never used  Substance and Sexual Activity  . Alcohol use: Yes    Comment: Very rare  . Drug use: Not Currently  . Sexual activity: Not Currently    Family History  Problem Relation Age of Onset  . Arthritis Mother   . Heart disease Mother   . Hypertension Mother   . Arthritis Father   . Heart attack Father   . Heart disease Father   . Arthritis Daughter   . Arthritis Son   . Depression Maternal Aunt   .  Hyperlipidemia Maternal Aunt   . Hypertension Maternal Aunt     Immunization History  Administered Date(s) Administered  . Fluad Quad(high Dose 65+) 11/08/2018  . H1N1 01/27/2008  . Influenza, High Dose Seasonal PF 11/08/2012, 11/02/2013, 11/20/2014, 10/05/2015, 10/04/2016, 11/30/2017  . PFIZER SARS-COV-2 Vaccination 03/14/2019, 04/04/2019  . Pneumococcal Conjugate-13 12/29/2014  . Pneumococcal Polysaccharide-23 12/29/2015  . Zoster 10/06/2006  . Zoster Recombinat (Shingrix) 10/22/2017   Objective: There were no vitals filed for this visit. Vascular Examination: Capillary refill time immediate x 10 digits.  Dorsalis pedis pulses pulses 2/4 b/l.  Posterior tibial pulses 1/4 b/l.   Digital hair sparse x 10 digits.  Skin temperature gradient WNL b/l.  Edema noted b/l ankles. No increased warmth. No open wounds.   Dermatological Examination: Skin with normal turgor, texture and tone b/l.  Toenails 1-5 b/l discolored, thick, dystrophic with subungual debris and pain with palpation to nailbeds due to thickness of nails.  She has a small laceration on the dorsomedial aspect of her right 2nd digit. No erythema, no edema, no  drainage, no flocculence.  Hyperkeratotic lesion medial aspect right 2nd digit. No erythema, no edema, no drainage, no flocculence noted.  Musculoskeletal: Muscle strength 5/5 to all LE muscle groups b/l.  Hammertoes 2-5 b/l.  HAV with bunion b/l.   Neurological: Sensation intact 5/5 b/l with 10 gram monofilament.  Vibratory sensation intact b/l.   Assessment: 1. Painful onychomycosis toenails 1-5 b/l  2. Laceration right 2nd digit 3. Corn right 2nd digit 4. HAV with bunion b/l 5. Hammertoes 2-5 b/l 6. NIDDM  Plan: 1. Discussed diabetic foot care principles. Literature dispensed on today. 2. Toenails 1-5 b/l were debrided in length and girth without iatrogenic bleeding. 3. Triple antibiotic ointment applied to right 2nd digit. She is to apply Neosporin to right 2nd digit once daily until healed. Call office if she encounters any problems. 4. Corn right 2nd digit pared utilizing sterile scalpel blade without incident. 5. Patient to continue soft, supportive shoe gear b/l. 6. Patient to report any pedal injuries to medical professional immediately. 7. Follow up 3 months.  8. Patient/POA to call should there be a concern in the interim.

## 2019-09-05 ENCOUNTER — Other Ambulatory Visit: Payer: Self-pay | Admitting: Cardiology

## 2019-09-05 NOTE — Addendum Note (Signed)
Addended by: Truddie Hidden on: 09/05/2019 01:20 PM   Modules accepted: Orders

## 2019-09-05 NOTE — Addendum Note (Signed)
Addended by: Truddie Hidden on: 09/05/2019 01:25 PM   Modules accepted: Orders

## 2019-09-06 LAB — CBC WITH DIFFERENTIAL/PLATELET
Basophils Absolute: 0.1 10*3/uL (ref 0.0–0.2)
Basos: 1 %
EOS (ABSOLUTE): 0.3 10*3/uL (ref 0.0–0.4)
Eos: 3 %
Hematocrit: 25.9 % — ABNORMAL LOW (ref 34.0–46.6)
Hemoglobin: 8.3 g/dL — ABNORMAL LOW (ref 11.1–15.9)
Immature Grans (Abs): 0 10*3/uL (ref 0.0–0.1)
Immature Granulocytes: 0 %
Lymphocytes Absolute: 1.4 10*3/uL (ref 0.7–3.1)
Lymphs: 18 %
MCH: 30.6 pg (ref 26.6–33.0)
MCHC: 32 g/dL (ref 31.5–35.7)
MCV: 96 fL (ref 79–97)
Monocytes Absolute: 0.6 10*3/uL (ref 0.1–0.9)
Monocytes: 8 %
Neutrophils Absolute: 5.3 10*3/uL (ref 1.4–7.0)
Neutrophils: 70 %
Platelets: 235 10*3/uL (ref 150–450)
RBC: 2.71 x10E6/uL — CL (ref 3.77–5.28)
RDW: 13.1 % (ref 11.7–15.4)
WBC: 7.6 10*3/uL (ref 3.4–10.8)

## 2019-09-07 ENCOUNTER — Telehealth: Payer: Self-pay

## 2019-09-07 NOTE — Telephone Encounter (Signed)
-----   Message from Park Liter, MD sent at 09/07/2019 12:19 PM EDT ----- Her H&H improved it was 8.1 before and I do not see this results in the chart surprisingly.  She supposed to stop aspirin but continue with Eliquis.  She need to have another CBC only H&H at the beginning of next week

## 2019-09-07 NOTE — Telephone Encounter (Signed)
Patient returned your call.

## 2019-09-07 NOTE — Telephone Encounter (Signed)
Left message on patients voicemail to please return our call.   

## 2019-09-09 ENCOUNTER — Other Ambulatory Visit: Payer: Medicare Other

## 2019-09-09 ENCOUNTER — Ambulatory Visit: Payer: Medicare Other

## 2019-09-15 ENCOUNTER — Other Ambulatory Visit: Payer: Self-pay | Admitting: Oncology

## 2019-09-19 ENCOUNTER — Ambulatory Visit (INDEPENDENT_AMBULATORY_CARE_PROVIDER_SITE_OTHER): Payer: Medicare Other | Admitting: Family Medicine

## 2019-09-19 ENCOUNTER — Other Ambulatory Visit: Payer: Self-pay

## 2019-09-19 ENCOUNTER — Encounter: Payer: Self-pay | Admitting: Family Medicine

## 2019-09-19 VITALS — BP 130/78 | HR 72 | Temp 97.6°F | Resp 16 | Ht 64.0 in | Wt 162.1 lb

## 2019-09-19 DIAGNOSIS — M1612 Unilateral primary osteoarthritis, left hip: Secondary | ICD-10-CM | POA: Insufficient documentation

## 2019-09-19 DIAGNOSIS — Z789 Other specified health status: Secondary | ICD-10-CM | POA: Diagnosis not present

## 2019-09-19 DIAGNOSIS — I2510 Atherosclerotic heart disease of native coronary artery without angina pectoris: Secondary | ICD-10-CM | POA: Diagnosis not present

## 2019-09-19 DIAGNOSIS — R269 Unspecified abnormalities of gait and mobility: Secondary | ICD-10-CM

## 2019-09-19 HISTORY — DX: Unilateral primary osteoarthritis, left hip: M16.12

## 2019-09-19 NOTE — Progress Notes (Signed)
   Subjective:    Patient ID: Lauren Walker, female    DOB: Aug 10, 1929, 86 y.o.   MRN: [id number]  HPI Mobility concerns- pt has both a 4 point cane and a rolling walker.  Pt is planning to move in w/ daughter and there are stairs and bedroom is upstairs.  Daughter is fearful that she will not be able to negotiate the stairs and she will fall.  Has known L hip arthritis.  She also has SOB due to cardiac issues.  Hoping to arrange a stair lift.  Pt walks w/ a Trendelenburg gait.  Has difficult time entering and exiting a vehicle.  Pt already has handicapped placard.   Review of Systems For ROS see HPI   This visit occurred during the SARS-CoV-2 public health emergency.  Safety protocols were in place, including screening questions prior to the visit, additional usage of staff PPE, and extensive cleaning of exam room while observing appropriate contact time as indicated for disinfecting solutions.       Objective:   Physical Exam Vitals reviewed.  Constitutional:      General: She is not in acute distress.    Appearance: She is not ill-appearing.     Comments: Frail, elderly woman  Cardiovascular:     Rate and Rhythm: Normal rate and regular rhythm.     Heart sounds: Murmur heard.   Pulmonary:     Effort: Pulmonary effort is normal. No respiratory distress.     Breath sounds: Normal breath sounds. No wheezing or rhonchi.  Musculoskeletal:     Right lower leg: No edema.     Left lower leg: No edema.  Skin:    General: Skin is warm and dry.  Neurological:     Mental Status: She is alert and oriented to person, place, and time.     Motor: Weakness (bilateral LE weakness- uses cane or walker for support) present.     Gait: Gait abnormal (Trendelenburg gait).  Psychiatric:        Mood and Affect: Mood normal.        Behavior: Behavior normal.        Thought Content: Thought content normal.           Assessment & Plan:  Abnormal gait- ongoing issue for pt.  + trendelenburg  gait.  Pt requires cane or walker for ambulation but is not able to safely go upstairs due to weakness and poor balance.  She is moving in w/ daughter b/c of multiple medical issues and daughter's house requires her to go up and down stairs.  In order to safely do this, she requires a stair lift.  Provided pt and daughter with names of private companies and have reached out to home health agencies to determine how best to proceed.

## 2019-09-19 NOTE — Patient Instructions (Signed)
Follow up as needed or as scheduled We'll investigate the best way to get the stair lift (and try to get it covered) Make sure you are using your cane or walking at all times Call with any questions or concerns Hopefully the rest of the summer will be uneventful!

## 2019-09-20 ENCOUNTER — Ambulatory Visit (INDEPENDENT_AMBULATORY_CARE_PROVIDER_SITE_OTHER): Payer: Medicare Other | Admitting: *Deleted

## 2019-09-20 DIAGNOSIS — I480 Paroxysmal atrial fibrillation: Secondary | ICD-10-CM | POA: Diagnosis not present

## 2019-09-20 LAB — CUP PACEART REMOTE DEVICE CHECK
Battery Remaining Longevity: 67 mo
Battery Remaining Percentage: 94 %
Battery Voltage: 2.99 V
Brady Statistic AP VP Percent: 1 %
Brady Statistic AP VS Percent: 96 %
Brady Statistic AS VP Percent: 1 %
Brady Statistic AS VS Percent: 1.9 %
Brady Statistic RA Percent Paced: 94 %
Brady Statistic RV Percent Paced: 1 %
Date Time Interrogation Session: 20210803020425
HighPow Impedance: 78 Ohm
Implantable Lead Implant Date: 20210503
Implantable Lead Implant Date: 20210503
Implantable Lead Location: 753859
Implantable Lead Location: 753860
Implantable Pulse Generator Implant Date: 20210503
Lead Channel Impedance Value: 430 Ohm
Lead Channel Impedance Value: 480 Ohm
Lead Channel Pacing Threshold Amplitude: 0.75 V
Lead Channel Pacing Threshold Amplitude: 0.75 V
Lead Channel Pacing Threshold Pulse Width: 0.5 ms
Lead Channel Pacing Threshold Pulse Width: 0.5 ms
Lead Channel Sensing Intrinsic Amplitude: 1.6 mV
Lead Channel Sensing Intrinsic Amplitude: 12 mV
Lead Channel Setting Pacing Amplitude: 3.5 V
Lead Channel Setting Pacing Amplitude: 3.5 V
Lead Channel Setting Pacing Pulse Width: 0.5 ms
Lead Channel Setting Sensing Sensitivity: 0.5 mV
Pulse Gen Serial Number: 111019603

## 2019-09-20 NOTE — Assessment & Plan Note (Signed)
New to provider, ongoing for pt.  Severely impacts ability to walk- particularly going up stairs.  + trendelenburg gait due to pain and weakness.  Pt needs stair lift to safely navigate her daughter's home

## 2019-09-22 ENCOUNTER — Ambulatory Visit: Payer: Medicare Other | Admitting: Cardiology

## 2019-09-22 NOTE — Progress Notes (Signed)
Remote ICD transmission.   

## 2019-09-23 ENCOUNTER — Ambulatory Visit (INDEPENDENT_AMBULATORY_CARE_PROVIDER_SITE_OTHER): Payer: Medicare Other | Admitting: Internal Medicine

## 2019-09-23 ENCOUNTER — Encounter: Payer: Self-pay | Admitting: Internal Medicine

## 2019-09-23 ENCOUNTER — Other Ambulatory Visit: Payer: Self-pay

## 2019-09-23 VITALS — BP 148/74 | HR 63 | Ht 64.0 in | Wt 162.6 lb

## 2019-09-23 DIAGNOSIS — R001 Bradycardia, unspecified: Secondary | ICD-10-CM

## 2019-09-23 DIAGNOSIS — I340 Nonrheumatic mitral (valve) insufficiency: Secondary | ICD-10-CM

## 2019-09-23 DIAGNOSIS — I472 Ventricular tachycardia, unspecified: Secondary | ICD-10-CM

## 2019-09-23 DIAGNOSIS — Z95810 Presence of automatic (implantable) cardiac defibrillator: Secondary | ICD-10-CM

## 2019-09-23 DIAGNOSIS — I2510 Atherosclerotic heart disease of native coronary artery without angina pectoris: Secondary | ICD-10-CM | POA: Diagnosis not present

## 2019-09-23 DIAGNOSIS — I480 Paroxysmal atrial fibrillation: Secondary | ICD-10-CM

## 2019-09-23 NOTE — Patient Instructions (Signed)
Medication Instructions:  Your physician recommends that you continue on your current medications as directed. Please refer to the Current Medication list given to you today.  Labwork: None ordered.  Testing/Procedures: None ordered.  Follow-Up: Your physician wants you to follow-up in: one year with Dr. Lovena Le.   You will receive a reminder letter in the mail two months in advance. If you don't receive a letter, please call our office to schedule the follow-up appointment.  Remote monitoring is used to monitor your ICD from home. This monitoring reduces the number of office visits required to check your device to one time per year. It allows [place] to keep an eye on the functioning of your device to ensure it is working properly. You are scheduled for a device check from home on 12/20/2019. You may send your transmission at any time that day. If you have a wireless device, the transmission will be sent automatically. After your physician reviews your transmission, you will receive a postcard with your next transmission date.  Any Other Special Instructions Will Be Listed Below (If Applicable).  If you need a refill on your cardiac medications before your next appointment, please call your pharmacy.

## 2019-09-23 NOTE — Progress Notes (Signed)
HPI Lauren Walker returns today fo followup. She is a pleasant woman with a h/o breast CA, chronic systolic heart failure, s/p ICD insertion secondary to VT with syncope. She has done well in the interim. She denies chest pain or sob. She has had some stinging around her ICD site. This is mild and infrequent. She wears support hose for peripheral edema.  Allergies  Allergen Reactions  . Amiodarone Other (See Comments)    Prolonged QT, VT  . Aranesp (Albumin Free) [Darbepoetin Alfa] Other (See Comments)    Patient had cardiac arrest evening after receiving this  . Tape Itching, Rash and Other (See Comments)    Reaction was from the surgical tape from cyst removed     Current Outpatient Medications  Medication Sig Dispense Refill  . acetaminophen (TYLENOL) 650 MG CR tablet Take 650 mg by mouth every 8 (eight) hours as needed for pain.    Marland Kitchen anastrozole (ARIMIDEX) 1 MG tablet TAKE 1 TABLET BY MOUTH EVERY DAY 90 tablet 0  . apixaban (ELIQUIS) 2.5 MG TABS tablet Take 1 tablet (2.5 mg total) by mouth 2 (two) times daily. 180 tablet 3  . atorvastatin (LIPITOR) 40 MG tablet Take 1 tablet (40 mg total) by mouth daily. 90 tablet 1  . Azelastine-Fluticasone 137-50 MCG/ACT SUSP Place 1 spray into both nostrils 2 (two) times daily.     Marland Kitchen b complex vitamins capsule Take 1 capsule by mouth daily.    . Biotin 1 MG CAPS Take 1 mg by mouth daily.     . carvedilol (COREG) 3.125 MG tablet TAKE 1 TABLET (3.125 MG TOTAL) BY MOUTH 2 (TWO) TIMES DAILY WITH A MEAL. 60 tablet 6  . Cholecalciferol (VITAMIN D3) 50 MCG (2000 UT) TABS Take 2,000 Units by mouth at bedtime.     . Ferrous Sulfate (IRON) 325 (65 Fe) MG TABS Take 325 mg by mouth daily with breakfast.     . fluticasone (FLONASE) 50 MCG/ACT nasal spray SPRAY 2 SPRAYS INTO EACH NOSTRIL EVERY DAY 48 mL 2  . isosorbide dinitrate (ISORDIL) 20 MG tablet TAKE 2 TABLETS (40MG ) BY MOUTH TWICE DAILY 120 tablet 1  . isosorbide dinitrate (ISORDIL) 40 MG tablet Take  1 tablet (40 mg total) by mouth 2 (two) times daily. 60 tablet 0  . levothyroxine (SYNTHROID) 150 MCG tablet TAKE 1 TABLET BY MOUTH DAILY BEFORE BREAKFAST. 90 tablet 1  . magnesium oxide (MAG-OX) 400 MG tablet Take 400 mg by mouth at bedtime.     . nitroGLYCERIN (NITROSTAT) 0.4 MG SL tablet Place 0.4 mg under the tongue every 5 (five) minutes as needed for chest pain.    Marland Kitchen quinapril (ACCUPRIL) 10 MG tablet Take 1 tablet (10 mg total) by mouth 2 (two) times daily. 60 tablet 3  . torsemide (DEMADEX) 20 MG tablet Take 1.5 tablets (30 mg total) by mouth daily. 45 tablet 2  . vitamin C (ASCORBIC ACID) 500 MG tablet Take 500 mg by mouth daily.     No current facility-administered medications for this visit.     Past Medical History:  Diagnosis Date  . Acute CHF (congestive heart failure) ([place]) 05/25/2019  . Anemia associated with stage 4 chronic renal failure ([place]) 05/17/2019  . Aortic stenosis 02/14/2019  . Arthritis   . CAD (coronary artery disease) 08/16/2018  . Chronic kidney disease   . Chronic systolic dysfunction of left ventricle   . CKD stage G4/A1, GFR 15-29 and albumin creatinine ratio <30 mg/g (HCC)  05/17/2019  . Coronary artery disease   . Diabetes mellitus without complication ([place])   . Diverticulitis   . History of MI (myocardial infarction) 08/16/2018  . HTN (hypertension) 08/16/2018  . Hyperlipidemia associated with type 2 diabetes mellitus ([place]) 08/16/2018  . Hypertension   . Hypothyroid 08/16/2018  . Hypothyroidism   . ICD (implantable cardioverter-defibrillator) in place 07/06/2019  . Malignant neoplasm of upper-outer quadrant of left breast in female, estrogen receptor positive ([place]) 03/02/2019  . Mitral regurgitation moderate to severe based on echocardiogram in summer 2020 11/04/2018  . Myocardial infarction ([place])   . Pacemaker 06/29/2019  . Paroxysmal atrial fibrillation (HCC)   . Pleural effusion on left 06/29/2019  . Sinus bradycardia 06/17/2019  . Thyroid disease   .  Transaminitis 06/17/2019  . Type 2 diabetes mellitus with other circulatory complications ([place]) 9/38/1829  . V-tach ([place]) 06/17/2019    ROS:   All systems reviewed and negative except as noted in the HPI.   Past Surgical History:  Procedure Laterality Date  . APPENDECTOMY  1936  . ICD IMPLANT N/A 06/20/2019   Procedure: ICD IMPLANT;  Surgeon: Evans Lance, MD;  Location: [facility] [facility];  Service: Cardiovascular;  Laterality: N/A;  . IR THORACENTESIS ASP PLEURAL SPACE W/IMG GUIDE  05/27/2019  . PARS PLANA VITRECTOMY Right 12/04/2018   Procedure: RETINAL DETACHMENT REPAIR PPV 25 GAUGE WITH ENDO LASER AIR/FLUID EXCHANGE SF6 GAS INJECTION;  Surgeon: Jalene Mullet, MD;  Location: [place] [facility];  Service: Ophthalmology;  Laterality: Right;  . TONSILLECTOMY       Family History  Problem Relation Age of Onset  . Arthritis Mother   . Heart disease Mother   . Hypertension Mother   . Arthritis Father   . Heart attack Father   . Heart disease Father   . Arthritis Daughter   . Arthritis Son   . Depression Maternal Aunt   . Hyperlipidemia Maternal Aunt   . Hypertension Maternal Aunt      Social History   Socioeconomic History  . Marital status: Married    Spouse name: Not on file  . Number of children: Not on file  . Years of education: Not on file  . Highest education level: Not on file  Occupational History  . Not on file  Tobacco Use  . Smoking status: Former Research scientist (life sciences)  . Smokeless tobacco: Never Used  Vaping Use  . Vaping Use: Never used  Substance and Sexual Activity  . Alcohol use: Yes    Comment: Very rare  . Drug use: Not Currently  . Sexual activity: Not Currently  Other Topics Concern  . Not on file  Social History Narrative  . Not on file   Social Determinants of Health   Financial Resource Strain:   . Difficulty of Paying Living Expenses:   Food Insecurity:   . Worried About Charity fundraiser in the Last Year:   . Arboriculturist in the Last Year:     Transportation Needs:   . Film/video editor (Medical):   Marland Kitchen Lack of Transportation (Non-Medical):   Physical Activity:   . Days of Exercise per Week:   . Minutes of Exercise per Session:   Stress:   . Feeling of Stress :   Social Connections:   . Frequency of Communication with Friends and Family:   . Frequency of Social Gatherings with Friends and Family:   . Attends Religious Services:   . Active Member of Clubs or Organizations:   .  Attends Archivist Meetings:   Marland Kitchen Marital Status:   Intimate Partner Violence:   . Fear of Current or Ex-Partner:   . Emotionally Abused:   Marland Kitchen Physically Abused:   . Sexually Abused:      Ht 5\' 4"  (1.626 m)   BMI 27.83 kg/m   Physical Exam:  Well appearing NAD HEENT: Unremarkable Neck:  No JVD, no thyromegally Lymphatics:  No adenopathy Back:  No CVA tenderness Lungs:  Clear with no wheezes HEART:  Regular rate rhythm, no murmurs, no rubs, no clicks Abd:  soft, positive bowel sounds, no organomegally, no rebound, no guarding Ext:  2 plus pulses, no edema, no cyanosis, no clubbing Skin:  No rashes no nodules Neuro:  CN II through XII intact, motor grossly intact  EKG - nsr with atrial pacing  DEVICE  Normal device function.  See PaceArt for details.   Assess/Plan: 1. VT - she has had no recurrent ICD therapies.  2. Chronic systolic heart failure - her symptoms are class 2. She will continue her current meds.  3. coags - she has had no bleeding complications. Continue current meds. 4. ICD - her St. Jude DDD ICD is working normally.  [place] Spotted.

## 2019-09-25 ENCOUNTER — Other Ambulatory Visit: Payer: Self-pay | Admitting: Family Medicine

## 2019-09-27 ENCOUNTER — Telehealth: Payer: Self-pay | Admitting: Family Medicine

## 2019-09-27 NOTE — Telephone Encounter (Signed)
Patient needs a refill on her nitroglycerin.  She said that her's has expired.  She would like it sent to [facility] on [address].  Last visit 09/19/2019

## 2019-09-28 MED ORDER — NITROGLYCERIN 0.4 MG SL SUBL: 0.4000 mg | SUBLINGUAL_TABLET | SUBLINGUAL | 1 refills | Status: DC | PRN

## 2019-09-28 NOTE — Telephone Encounter (Signed)
Medication filled to pharmacy as requested.   

## 2019-10-14 ENCOUNTER — Other Ambulatory Visit: Payer: Self-pay | Admitting: Family Medicine

## 2019-10-18 ENCOUNTER — Other Ambulatory Visit: Payer: Self-pay | Admitting: Family Medicine

## 2019-10-20 ENCOUNTER — Other Ambulatory Visit: Payer: Self-pay

## 2019-10-20 ENCOUNTER — Emergency Department (HOSPITAL_COMMUNITY): Payer: Medicare Other

## 2019-10-20 ENCOUNTER — Emergency Department (HOSPITAL_COMMUNITY)
Admission: EM | Admit: 2019-10-20 | Discharge: 2019-10-20 | Disposition: A | Discharge status: Home / Self Care | Payer: Medicare Other | Source: Home / Self Care | Attending: Emergency Medicine | Admitting: Emergency Medicine

## 2019-10-20 ENCOUNTER — Encounter (HOSPITAL_COMMUNITY): Payer: Self-pay | Admitting: Emergency Medicine

## 2019-10-20 DIAGNOSIS — I10 Essential (primary) hypertension: Secondary | ICD-10-CM

## 2019-10-20 DIAGNOSIS — I509 Heart failure, unspecified: Secondary | ICD-10-CM | POA: Insufficient documentation

## 2019-10-20 DIAGNOSIS — Z87891 Personal history of nicotine dependence: Secondary | ICD-10-CM | POA: Insufficient documentation

## 2019-10-20 DIAGNOSIS — Z7901 Long term (current) use of anticoagulants: Secondary | ICD-10-CM | POA: Insufficient documentation

## 2019-10-20 DIAGNOSIS — Z79890 Hormone replacement therapy (postmenopausal): Secondary | ICD-10-CM | POA: Insufficient documentation

## 2019-10-20 DIAGNOSIS — R079 Chest pain, unspecified: Secondary | ICD-10-CM | POA: Insufficient documentation

## 2019-10-20 DIAGNOSIS — I252 Old myocardial infarction: Secondary | ICD-10-CM | POA: Insufficient documentation

## 2019-10-20 DIAGNOSIS — N184 Chronic kidney disease, stage 4 (severe): Secondary | ICD-10-CM | POA: Insufficient documentation

## 2019-10-20 DIAGNOSIS — Z79899 Other long term (current) drug therapy: Secondary | ICD-10-CM | POA: Insufficient documentation

## 2019-10-20 DIAGNOSIS — I480 Paroxysmal atrial fibrillation: Secondary | ICD-10-CM | POA: Diagnosis not present

## 2019-10-20 DIAGNOSIS — I2510 Atherosclerotic heart disease of native coronary artery without angina pectoris: Secondary | ICD-10-CM | POA: Insufficient documentation

## 2019-10-20 DIAGNOSIS — I130 Hypertensive heart and chronic kidney disease with heart failure and stage 1 through stage 4 chronic kidney disease, or unspecified chronic kidney disease: Secondary | ICD-10-CM | POA: Insufficient documentation

## 2019-10-20 DIAGNOSIS — I4891 Unspecified atrial fibrillation: Secondary | ICD-10-CM | POA: Insufficient documentation

## 2019-10-20 DIAGNOSIS — E039 Hypothyroidism, unspecified: Secondary | ICD-10-CM | POA: Insufficient documentation

## 2019-10-20 DIAGNOSIS — E1122 Type 2 diabetes mellitus with diabetic chronic kidney disease: Secondary | ICD-10-CM | POA: Insufficient documentation

## 2019-10-20 LAB — CBC WITH DIFFERENTIAL/PLATELET
Abs Immature Granulocytes: 0.05 10*3/uL (ref 0.00–0.07)
Basophils Absolute: 0 10*3/uL (ref 0.0–0.1)
Basophils Relative: 0 %
Eosinophils Absolute: 0.1 10*3/uL (ref 0.0–0.5)
Eosinophils Relative: 1 %
HCT: 31.1 % — ABNORMAL LOW (ref 36.0–46.0)
Hemoglobin: 9.4 g/dL — ABNORMAL LOW (ref 12.0–15.0)
Immature Granulocytes: 1 %
Lymphocytes Relative: 12 %
Lymphs Abs: 1.2 10*3/uL (ref 0.7–4.0)
MCH: 28.9 pg (ref 26.0–34.0)
MCHC: 30.2 g/dL (ref 30.0–36.0)
MCV: 95.7 fL (ref 80.0–100.0)
Monocytes Absolute: 1 10*3/uL (ref 0.1–1.0)
Monocytes Relative: 10 %
Neutro Abs: 7.4 10*3/uL (ref 1.7–7.7)
Neutrophils Relative %: 76 %
Platelets: 169 10*3/uL (ref 150–400)
RBC: 3.25 MIL/uL — ABNORMAL LOW (ref 3.87–5.11)
RDW: 12.6 % (ref 11.5–15.5)
WBC: 9.8 10*3/uL (ref 4.0–10.5)
nRBC: 0 % (ref 0.0–0.2)

## 2019-10-20 LAB — COMPREHENSIVE METABOLIC PANEL
ALT: 16 U/L (ref 0–44)
AST: 21 U/L (ref 15–41)
Albumin: 2.9 g/dL — ABNORMAL LOW (ref 3.5–5.0)
Alkaline Phosphatase: 40 U/L (ref 38–126)
Anion gap: 11 (ref 5–15)
BUN: 64 mg/dL — ABNORMAL HIGH (ref 8–23)
CO2: 23 mmol/L (ref 22–32)
Calcium: 8.4 mg/dL — ABNORMAL LOW (ref 8.9–10.3)
Chloride: 106 mmol/L (ref 98–111)
Creatinine, Ser: 1.94 mg/dL — ABNORMAL HIGH (ref 0.44–1.00)
GFR calc Af Amer: 26 mL/min — ABNORMAL LOW (ref >60)
GFR calc non Af Amer: 23 mL/min — ABNORMAL LOW (ref >60)
Glucose, Bld: 169 mg/dL — ABNORMAL HIGH (ref 70–99)
Potassium: 4 mmol/L (ref 3.5–5.1)
Sodium: 140 mmol/L (ref 135–145)
Total Bilirubin: 0.6 mg/dL (ref 0.3–1.2)
Total Protein: 5.6 g/dL — ABNORMAL LOW (ref 6.5–8.1)

## 2019-10-20 LAB — TROPONIN I (HIGH SENSITIVITY)
Troponin I (High Sensitivity): 18 ng/L — ABNORMAL HIGH (ref <18)
Troponin I (High Sensitivity): 18 ng/L — ABNORMAL HIGH (ref <18)

## 2019-10-20 LAB — BRAIN NATRIURETIC PEPTIDE: B Natriuretic Peptide: 516.6 pg/mL — ABNORMAL HIGH (ref 0.0–100.0)

## 2019-10-20 MED ORDER — AMLODIPINE BESYLATE 5 MG PO TABS
2.5000 mg | ORAL_TABLET | Freq: Once | ORAL | Status: AC
  Administered 2019-10-20: 2.5 mg via ORAL
  Filled 2019-10-20: qty 1

## 2019-10-20 MED ORDER — CARVEDILOL 12.5 MG PO TABS
6.2500 mg | ORAL_TABLET | Freq: Once | ORAL | Status: AC
  Administered 2019-10-20: 6.25 mg via ORAL
  Filled 2019-10-20: qty 1

## 2019-10-20 MED ORDER — AMLODIPINE BESYLATE 2.5 MG PO TABS: 2.5000 mg | ORAL_TABLET | Freq: Every day | ORAL | 0 refills | Status: DC

## 2019-10-20 NOTE — ED Provider Notes (Signed)
[facility]PART[facility] Provider Note   CSN: [id number] Arrival date & time: 10/20/19  1458     History Chief Complaint  Patient presents with  . Chest Pain    Lauren Walker is a 86 y.o. female hx of CHF, CAD, CKD, CHF, DM, Vtach s/p ICD, here presenting with chest pain.  Patient states that she woke up today and she has some chest pressure.  She states that her pressure is worse when she exerts herself.  She states that even minimal exertion like walking the bathroom gives her pressure.  She states that she took 1 nitro at home and EMS gave her another nitro and 324 aspirin.  She states that she has no pain currently when she is not exerting herself.  She has a pacemaker denies that any shocks or palpitations.  Patient follows up with cardiology at [facility].  Patient states that her legs are not not more swollen than usual.  The history is provided by the patient.       Past Medical History:  Diagnosis Date  . Acute CHF (congestive heart failure) ([place]) 05/25/2019  . Anemia associated with stage 4 chronic renal failure ([place]) 05/17/2019  . Aortic stenosis 02/14/2019  . Arthritis   . CAD (coronary artery disease) 08/16/2018  . Chronic kidney disease   . Chronic systolic dysfunction of left ventricle   . CKD stage G4/A1, GFR 15-29 and albumin creatinine ratio <30 mg/g (HCC) 05/17/2019  . Coronary artery disease   . Diabetes mellitus without complication ([place])   . Diverticulitis   . History of MI (myocardial infarction) 08/16/2018  . HTN (hypertension) 08/16/2018  . Hyperlipidemia associated with type 2 diabetes mellitus ([place]) 08/16/2018  . Hypertension   . Hypothyroid 08/16/2018  . Hypothyroidism   . ICD (implantable cardioverter-defibrillator) in place 07/06/2019  . Malignant neoplasm of upper-outer quadrant of left breast in female, estrogen receptor positive ([place]) 03/02/2019  . Mitral regurgitation moderate to severe based on echocardiogram  in summer 2020 11/04/2018  . Myocardial infarction ([place])   . Pacemaker 06/29/2019  . Paroxysmal atrial fibrillation (HCC)   . Pleural effusion on left 06/29/2019  . Sinus bradycardia 06/17/2019  . Thyroid disease   . Transaminitis 06/17/2019  . Type 2 diabetes mellitus with other circulatory complications ([place]) 2/95/2841  . V-tach [facility]) 06/17/2019    Patient Active Problem List   Diagnosis Date Noted  . Arthritis of left hip 09/19/2019  . ICD (implantable cardioverter-defibrillator) in place 07/06/2019  . Pleural effusion on left 06/29/2019  . Pacemaker 06/29/2019  . V-tach ([place]) 06/17/2019  . Sinus bradycardia 06/17/2019  . Transaminitis 06/17/2019  . Acute CHF (congestive heart failure) ([place]) 05/25/2019  . CKD stage G4/A1, GFR 15-29 and albumin creatinine ratio <30 mg/g (HCC) 05/17/2019  . Anemia associated with stage 4 chronic renal failure ([place]) 05/17/2019  . Malignant neoplasm of upper-outer quadrant of left breast in female, estrogen receptor positive ([place]) 03/02/2019  . Aortic stenosis 02/14/2019  . Mitral regurgitation moderate to severe based on echocardiogram in summer 2020 11/04/2018  . Paroxysmal atrial fibrillation ([place]) 10/11/2018  . Hyperlipidemia associated with type 2 diabetes mellitus ([place]) 08/16/2018  . Type 2 diabetes mellitus with other circulatory complications ([place]) 32/44/0102  . Hypothyroid 08/16/2018  . HTN (hypertension) 08/16/2018  . CAD (coronary artery disease) 08/16/2018  . History of MI (myocardial infarction) 08/16/2018    Past Surgical History:  Procedure Laterality Date  . APPENDECTOMY  1936  .  ICD IMPLANT N/A 06/20/2019   Procedure: ICD IMPLANT;  Surgeon: Evans Lance, MD;  Location: [facility];  Service: Cardiovascular;  Laterality: N/A;  . IR THORACENTESIS ASP PLEURAL SPACE W/IMG GUIDE  05/27/2019  . PARS PLANA VITRECTOMY Right 12/04/2018   Procedure: RETINAL DETACHMENT REPAIR PPV 25 GAUGE WITH ENDO LASER AIR/FLUID EXCHANGE SF6 GAS  INJECTION;  Surgeon: Jalene Mullet, MD;  Location: [facility];  Service: Ophthalmology;  Laterality: Right;  . TONSILLECTOMY       OB History   No obstetric history on file.     Family History  Problem Relation Age of Onset  . Arthritis Mother   . Heart disease Mother   . Hypertension Mother   . Arthritis Father   . Heart attack Father   . Heart disease Father   . Arthritis Daughter   . Arthritis Son   . Depression Maternal Aunt   . Hyperlipidemia Maternal Aunt   . Hypertension Maternal Aunt     Social History   Tobacco Use  . Smoking status: Former Research scientist (life sciences)  . Smokeless tobacco: Never Used  Vaping Use  . Vaping Use: Never used  Substance Use Topics  . Alcohol use: Yes    Comment: Very rare  . Drug use: Not Currently    Home Medications Prior to Admission medications   Medication Sig Start Date End Date Taking? Authorizing Provider  acetaminophen (TYLENOL) 650 MG CR tablet Take 650 mg by mouth every 8 (eight) hours as needed for pain.    [provider]  anastrozole (ARIMIDEX) 1 MG tablet TAKE 1 TABLET BY MOUTH EVERY DAY 09/15/19   Magrinat, Virgie Dad, MD  apixaban (ELIQUIS) 2.5 MG TABS tablet Take 1 tablet (2.5 mg total) by mouth 2 (two) times daily. 03/10/19   Loel Dubonnet, NP  atorvastatin (LIPITOR) 40 MG tablet TAKE 1 TABLET BY MOUTH EVERY DAY 10/18/19   Midge Minium, MD  Azelastine-Fluticasone 137-50 MCG/ACT SUSP Place 1 spray into both nostrils 2 (two) times daily.     [provider]  b complex vitamins capsule Take 1 capsule by mouth daily.    [provider]  Biotin 1 MG CAPS Take 1 mg by mouth daily.     [provider]  carvedilol (COREG) 3.125 MG tablet TAKE 1 TABLET (3.125 MG TOTAL) BY MOUTH 2 (TWO) TIMES DAILY WITH A MEAL. 07/13/19 09/23/19  Midge Minium, MD  Cholecalciferol (VITAMIN D3) 50 MCG (2000 UT) TABS Take 2,000 Units by mouth at bedtime.     [provider]  Ferrous Sulfate (IRON) 325 (65 Fe)  MG TABS Take 325 mg by mouth daily with breakfast.     [provider]  isosorbide dinitrate (ISORDIL) 20 MG tablet TAKE 2 TABLETS (40MG ) BY MOUTH TWICE DAILY 10/14/19   Midge Minium, MD  levothyroxine (SYNTHROID) 150 MCG tablet TAKE 1 TABLET BY MOUTH DAILY BEFORE BREAKFAST. 08/25/19   Midge Minium, MD  magnesium oxide (MAG-OX) 400 MG tablet Take 400 mg by mouth at bedtime.     [provider]  nitroGLYCERIN (NITROSTAT) 0.4 MG SL tablet Place 1 tablet (0.4 mg total) under the tongue every 5 (five) minutes as needed for chest pain. 09/28/19   Midge Minium, MD  quinapril (ACCUPRIL) 10 MG tablet TAKE 1 TABLET BY MOUTH TWICE A DAY 09/26/19   Midge Minium, MD  torsemide (DEMADEX) 20 MG tablet Take 1.5 tablets (30 mg total) by mouth daily. 08/05/19  Park Liter, MD  vitamin C (ASCORBIC ACID) 500 MG tablet Take 500 mg by mouth daily.    [provider]    Allergies    Amiodarone, Aranesp (albumin free) [darbepoetin alfa], and Tape  Review of Systems   Review of Systems  Cardiovascular: Positive for chest pain.  All other systems reviewed and are negative.   Physical Exam Updated Vital Signs BP (!) 205/63   Pulse 65   Temp 98.7 F (37.1 C) (Oral)   Resp 20   Ht 5\' 4"  (1.626 m)   Wt 73.8 kg   SpO2 97%   BMI 27.93 kg/m   Physical Exam Vitals and nursing note reviewed.  HENT:     Head: Normocephalic.  Eyes:     Extraocular Movements: Extraocular movements intact.     Pupils: Pupils are equal, round, and reactive to light.  Cardiovascular:     Rate and Rhythm: Normal rate and regular rhythm.     Heart sounds: Normal heart sounds.  Pulmonary:     Effort: Pulmonary effort is normal.     Breath sounds: Normal breath sounds.  Abdominal:     General: Bowel sounds are normal.     Palpations: Abdomen is soft.  Musculoskeletal:        General: Normal range of motion.     Cervical back: Normal range of motion.  Skin:    General:  Skin is warm.     Capillary Refill: Capillary refill takes less than 2 seconds.  Neurological:     General: No focal deficit present.     Mental Status: She is alert and oriented to person, place, and time.  Psychiatric:        Mood and Affect: Mood normal.        Behavior: Behavior normal.     ED Results / Procedures / Treatments   Labs (all labs ordered are listed, but only abnormal results are displayed) Labs Reviewed  CBC WITH DIFFERENTIAL/PLATELET - Abnormal; Notable for the following components:      Result Value   RBC 3.25 (*)    Hemoglobin 9.4 (*)    HCT 31.1 (*)    All other components within normal limits  COMPREHENSIVE METABOLIC PANEL - Abnormal; Notable for the following components:   Glucose, Bld 169 (*)    BUN 64 (*)    Creatinine, Ser 1.94 (*)    Calcium 8.4 (*)    Total Protein 5.6 (*)    Albumin 2.9 (*)    GFR calc non Af Amer 23 (*)    GFR calc Af Amer 26 (*)    All other components within normal limits  TROPONIN I (HIGH SENSITIVITY) - Abnormal; Notable for the following components:   Troponin I (High Sensitivity) 18 (*)    All other components within normal limits  TROPONIN I (HIGH SENSITIVITY) - Abnormal; Notable for the following components:   Troponin I (High Sensitivity) 18 (*)    All other components within normal limits  BRAIN NATRIURETIC PEPTIDE    EKG EKG Interpretation  Date/Time:  Thursday October 20 2019 15:20:14 EDT Ventricular Rate:  64 PR Interval:    QRS Duration: 115 QT Interval:  470 QTC Calculation: 485 R Axis:   -44 Text Interpretation: Atrial flutter with predominant 4:1 AV block Multiple ventricular premature complexes Nonspecific IVCD with LAD LVH with secondary repolarization abnormality No significant change since last tracing Confirmed by Wandra Arthurs [phone]) on 10/20/2019 3:53:02 PM   Radiology DG  Chest 2 View  Result Date: 10/20/2019 CLINICAL DATA:  Chest pain EXAM: CHEST - 2 VIEW COMPARISON:  06/24/2019 FINDINGS:  Tiny left pleural effusion is present, decreased in size since prior examination. Lungs are well expanded and are otherwise clear. No pneumothorax. Right subclavian pacemaker defibrillator is unchanged. Cardiac size is mildly enlarged, unchanged. Pulmonary vascularity is normal. No acute bone abnormality. IMPRESSION: 1. Tiny left pleural effusion, decreased in size since prior examination. 2. Stable mild cardiomegaly. Electronically Signed   By: Fidela Salisbury MD   On: 10/20/2019 15:45    Procedures Procedures (including critical care time)  Medications Ordered in ED Medications  carvedilol (COREG) tablet 6.25 mg (6.25 mg Oral Given 10/20/19 1927)  amLODipine (NORVASC) tablet 2.5 mg (2.5 mg Oral Given 10/20/19 1939)    ED Course  I have reviewed the triage vital signs and the nursing notes.  Pertinent labs & imaging results that were available during my care of the patient were reviewed by me and considered in my medical decision making (see chart for details).    MDM Rules/Calculators/A&P                          Lauren Walker is a 86 y.o. female who presented with chest pain.  Patient does have history of CHF and MI in the past.  She is high risk for ACS.  Pain is worse with exertion.  Plan to get CBC, CMP, troponin, chest x-ray.  We will also interrogate her pacemaker.  Patient will likely need cardiology consult.   7:50 PM Patient's troponin is flat at 18.  Her creatinine is 1.9 which is baseline.  Chest x-ray showed decreased pleural effusion.  Patient ambulated to the bathroom with no chest pain.  She states that she just had some shortness of breath.  Patient was noted to have a blood pressure of 205 right now.  She did not take her nighttime dose of Coreg. I discussed case with Dr. Domenic Polite from cardiology.  He recommend adding Norvasc 2.5 mg.  She is on long-acting nitro already.  He will try and get her follow-up outpatient with cardiology.  I discussed admission for observation but  patient would rather go home.  Since we were able to get her close follow-up, I think that is reasonable.  Final Clinical Impression(s) / ED Diagnoses Final diagnoses:  None    Rx / DC Orders ED Discharge Orders    None       Drenda Freeze, MD 10/20/19 [phone]

## 2019-10-20 NOTE — ED Notes (Signed)
Gave transmission of pacemaker sent by rep to MD

## 2019-10-20 NOTE — ED Triage Notes (Signed)
Pt woke up with CP at 0600 this morning. Pt rated 2/10 without movement 5/10 with movement. Pt took 1 nitro at home about 1.5 hrs ago. EMS gave 1 nitro and 324 ASA in route with no relief. Pacemaker in place.

## 2019-10-20 NOTE — Discharge Instructions (Signed)
Your blood pressure is elevated and that can cause you to have chest pain.  Continue current blood pressure medicine and we will add Norvasc 2.5 mg daily.  Cardiology office will contact you regarding appointment.  Return to ER if you have worse chest pain, shortness of breath, leg swelling.

## 2019-10-20 NOTE — ED Notes (Addendum)
Called [employer] for rep to come interrogate pacemaker after in house interrogator failed.

## 2019-10-21 ENCOUNTER — Encounter (HOSPITAL_BASED_OUTPATIENT_CLINIC_OR_DEPARTMENT_OTHER): Payer: Self-pay | Admitting: *Deleted

## 2019-10-21 ENCOUNTER — Inpatient Hospital Stay (HOSPITAL_BASED_OUTPATIENT_CLINIC_OR_DEPARTMENT_OTHER)
Admission: EM | Admit: 2019-10-21 | Discharge: 2019-10-24 | DRG: 309 | Disposition: A | Discharge status: Home / Self Care | Payer: Medicare Other | Source: Ambulatory Visit | Attending: Internal Medicine | Admitting: Internal Medicine

## 2019-10-21 ENCOUNTER — Other Ambulatory Visit: Payer: Self-pay

## 2019-10-21 ENCOUNTER — Encounter: Payer: Self-pay | Admitting: Cardiology

## 2019-10-21 ENCOUNTER — Ambulatory Visit (INDEPENDENT_AMBULATORY_CARE_PROVIDER_SITE_OTHER): Payer: Medicare Other | Admitting: Cardiology

## 2019-10-21 VITALS — BP 100/60 | HR 104 | Ht 64.0 in | Wt 164.0 lb

## 2019-10-21 DIAGNOSIS — I340 Nonrheumatic mitral (valve) insufficiency: Secondary | ICD-10-CM | POA: Diagnosis not present

## 2019-10-21 DIAGNOSIS — Z20822 Contact with and (suspected) exposure to covid-19: Secondary | ICD-10-CM | POA: Diagnosis present

## 2019-10-21 DIAGNOSIS — R079 Chest pain, unspecified: Secondary | ICD-10-CM

## 2019-10-21 DIAGNOSIS — Z170 Estrogen receptor positive status [ER+]: Secondary | ICD-10-CM

## 2019-10-21 DIAGNOSIS — I252 Old myocardial infarction: Secondary | ICD-10-CM

## 2019-10-21 DIAGNOSIS — Z95810 Presence of automatic (implantable) cardiac defibrillator: Secondary | ICD-10-CM

## 2019-10-21 DIAGNOSIS — I2510 Atherosclerotic heart disease of native coronary artery without angina pectoris: Secondary | ICD-10-CM | POA: Diagnosis present

## 2019-10-21 DIAGNOSIS — D631 Anemia in chronic kidney disease: Secondary | ICD-10-CM | POA: Diagnosis present

## 2019-10-21 DIAGNOSIS — E1169 Type 2 diabetes mellitus with other specified complication: Secondary | ICD-10-CM | POA: Diagnosis present

## 2019-10-21 DIAGNOSIS — E1122 Type 2 diabetes mellitus with diabetic chronic kidney disease: Secondary | ICD-10-CM | POA: Diagnosis present

## 2019-10-21 DIAGNOSIS — I130 Hypertensive heart and chronic kidney disease with heart failure and stage 1 through stage 4 chronic kidney disease, or unspecified chronic kidney disease: Secondary | ICD-10-CM | POA: Diagnosis present

## 2019-10-21 DIAGNOSIS — I4819 Other persistent atrial fibrillation: Secondary | ICD-10-CM | POA: Diagnosis present

## 2019-10-21 DIAGNOSIS — Z87891 Personal history of nicotine dependence: Secondary | ICD-10-CM

## 2019-10-21 DIAGNOSIS — I5042 Chronic combined systolic (congestive) and diastolic (congestive) heart failure: Secondary | ICD-10-CM | POA: Diagnosis present

## 2019-10-21 DIAGNOSIS — I10 Essential (primary) hypertension: Secondary | ICD-10-CM | POA: Diagnosis not present

## 2019-10-21 DIAGNOSIS — I472 Ventricular tachycardia, unspecified: Secondary | ICD-10-CM

## 2019-10-21 DIAGNOSIS — N184 Chronic kidney disease, stage 4 (severe): Secondary | ICD-10-CM | POA: Diagnosis present

## 2019-10-21 DIAGNOSIS — C50412 Malignant neoplasm of upper-outer quadrant of left female breast: Secondary | ICD-10-CM | POA: Diagnosis present

## 2019-10-21 DIAGNOSIS — I480 Paroxysmal atrial fibrillation: Secondary | ICD-10-CM

## 2019-10-21 DIAGNOSIS — Z79899 Other long term (current) drug therapy: Secondary | ICD-10-CM

## 2019-10-21 DIAGNOSIS — Z888 Allergy status to other drugs, medicaments and biological substances status: Secondary | ICD-10-CM

## 2019-10-21 DIAGNOSIS — Z7901 Long term (current) use of anticoagulants: Secondary | ICD-10-CM

## 2019-10-21 DIAGNOSIS — Z91048 Other nonmedicinal substance allergy status: Secondary | ICD-10-CM

## 2019-10-21 DIAGNOSIS — I4891 Unspecified atrial fibrillation: Secondary | ICD-10-CM | POA: Diagnosis not present

## 2019-10-21 DIAGNOSIS — I080 Rheumatic disorders of both mitral and aortic valves: Secondary | ICD-10-CM | POA: Diagnosis present

## 2019-10-21 DIAGNOSIS — Z7289 Other problems related to lifestyle: Secondary | ICD-10-CM

## 2019-10-21 DIAGNOSIS — Z79890 Hormone replacement therapy (postmenopausal): Secondary | ICD-10-CM

## 2019-10-21 DIAGNOSIS — E039 Hypothyroidism, unspecified: Secondary | ICD-10-CM | POA: Diagnosis present

## 2019-10-21 DIAGNOSIS — Z8249 Family history of ischemic heart disease and other diseases of the circulatory system: Secondary | ICD-10-CM

## 2019-10-21 DIAGNOSIS — E785 Hyperlipidemia, unspecified: Secondary | ICD-10-CM | POA: Diagnosis present

## 2019-10-21 HISTORY — DX: Chest pain, unspecified: R07.9

## 2019-10-21 LAB — CBC WITH DIFFERENTIAL/PLATELET
Abs Immature Granulocytes: 0.06 10*3/uL (ref 0.00–0.07)
Basophils Absolute: 0 10*3/uL (ref 0.0–0.1)
Basophils Relative: 0 %
Eosinophils Absolute: 0.1 10*3/uL (ref 0.0–0.5)
Eosinophils Relative: 1 %
HCT: 32.9 % — ABNORMAL LOW (ref 36.0–46.0)
Hemoglobin: 10.6 g/dL — ABNORMAL LOW (ref 12.0–15.0)
Immature Granulocytes: 1 %
Lymphocytes Relative: 20 %
Lymphs Abs: 2.5 10*3/uL (ref 0.7–4.0)
MCH: 29.9 pg (ref 26.0–34.0)
MCHC: 32.2 g/dL (ref 30.0–36.0)
MCV: 92.9 fL (ref 80.0–100.0)
Monocytes Absolute: 1.5 10*3/uL — ABNORMAL HIGH (ref 0.1–1.0)
Monocytes Relative: 12 %
Neutro Abs: 8.5 10*3/uL — ABNORMAL HIGH (ref 1.7–7.7)
Neutrophils Relative %: 66 %
Platelets: 226 10*3/uL (ref 150–400)
RBC: 3.54 MIL/uL — ABNORMAL LOW (ref 3.87–5.11)
RDW: 12.9 % (ref 11.5–15.5)
WBC: 12.6 10*3/uL — ABNORMAL HIGH (ref 4.0–10.5)
nRBC: 0 % (ref 0.0–0.2)

## 2019-10-21 LAB — BASIC METABOLIC PANEL
Anion gap: 10 (ref 5–15)
BUN: 68 mg/dL — ABNORMAL HIGH (ref 8–23)
CO2: 23 mmol/L (ref 22–32)
Calcium: 9 mg/dL (ref 8.9–10.3)
Chloride: 98 mmol/L (ref 98–111)
Creatinine, Ser: 2.26 mg/dL — ABNORMAL HIGH (ref 0.44–1.00)
GFR calc Af Amer: 22 mL/min — ABNORMAL LOW (ref >60)
GFR calc non Af Amer: 19 mL/min — ABNORMAL LOW (ref >60)
Glucose, Bld: 172 mg/dL — ABNORMAL HIGH (ref 70–99)
Potassium: 4 mmol/L (ref 3.5–5.1)
Sodium: 131 mmol/L — ABNORMAL LOW (ref 135–145)

## 2019-10-21 LAB — SARS CORONAVIRUS 2 BY RT PCR (HOSPITAL ORDER, PERFORMED IN ~~LOC~~ HOSPITAL LAB): SARS Coronavirus 2: NEGATIVE

## 2019-10-21 LAB — MAGNESIUM: Magnesium: 2.2 mg/dL (ref 1.7–2.4)

## 2019-10-21 MED ORDER — DILTIAZEM LOAD VIA INFUSION
20.0000 mg | Freq: Once | INTRAVENOUS | Status: AC
  Administered 2019-10-21: 20 mg via INTRAVENOUS
  Filled 2019-10-21: qty 20

## 2019-10-21 MED ORDER — DILTIAZEM HCL-DEXTROSE 125-5 MG/125ML-% IV SOLN (PREMIX)
5.0000 mg/h | INTRAVENOUS | Status: DC
  Administered 2019-10-21 – 2019-10-22 (×2): 5 mg/h via INTRAVENOUS
  Filled 2019-10-21 (×2): qty 125

## 2019-10-21 MED ORDER — OFF THE BEAT BOOK
Freq: Once | Status: AC
  Filled 2019-10-21: qty 1

## 2019-10-21 MED ORDER — PROPOFOL 10 MG/ML IV BOLUS
40.0000 mg | Freq: Once | INTRAVENOUS | Status: AC
  Administered 2019-10-21: 40 mg via INTRAVENOUS
  Filled 2019-10-21: qty 20

## 2019-10-21 NOTE — Sedation Documentation (Signed)
Continues to show Afib rate of 137.  Shocked for third time at [facility].

## 2019-10-21 NOTE — H&P (Signed)
Cardiology Admission History and Physical:   Patient ID: Lauren Walker; MRN: [id number]; DOB: July 22, 1929   Admission date: 10/21/2019  Primary Care Provider: Midge Minium, MD Primary Cardiologist: Jenne Campus, MD    Chief Complaint:  Chest discomfort  History of Present Illness:   Lauren Walker is a 86 y.o. female with a history of combined systolic and diastolic congestive heart failure (past ICD placement for VT and syncope), remote CAD, paroxysmal atrial fibrillation (on Eliquis), moderate to severe mitral regurgitation, CKD, hypertension, dyslipidemia and hypothyroidism, who initially presented to Dr Wendy Poet office with complaints of chest tightness. Apparently in the clinic she was noted to be tachycardic with the EKG showing A. fib with RVR. She also was complaining of chest tightness and resting shortness of breath. She was therefore referred to the emergency department.  In the hospital, she had a systolic blood pressure greater than 200 mmHg. Her antihypertensives were resumed. Chest x-ray showed a very small left pleural effusion.   ECG from 10/21/2019 (17:49) documented atrial fibrillation, rate 137 bpm, left anterior fascicular block and voltage criteria for LVH.  High-sensitivity troponins were flat (18 and 18). The BNP was 516. The labs were as follows: Potassium 4.0, glucose 169, BUN 64, creatinine 1.94, WBC 12.6, hemoglobin 10.6, hematocrit 32.9 and platelets of 226. Covid test was negative.   Recent studies Echocardiogram 05/25/2019 1. Left ventricular ejection fraction, by estimation, is 45 to 50%. The  left ventricle has mildly decreased function. The left ventricle  demonstrates regional wall motion abnormalities (see scoring  diagram/findings for description). There is moderate  left ventricular hypertrophy. Left ventricular diastolic parameters are  indeterminate. There is moderate hypokinesis of the left ventricular,  entire inferior wall.  2. Right  ventricular systolic function is normal. The right ventricular  size is normal. There is normal pulmonary artery systolic pressure.  3. Left atrial size was severely dilated.  4. Large pleural effusion in the left lateral region.  5. The mitral valve is grossly normal. Moderate to severe mitral valve  regurgitation.  6. The aortic valve is tricuspid. Aortic valve regurgitation is not  visualized. Mild to moderate aortic valve stenosis. Aortic valve area, by  VTI measures 1.49 cm. Aortic valve mean gradient measures 10.4 mmHg.  Aortic valve Vmax measures 2.17 m/s.  7. The inferior vena cava is dilated in size with >50% respiratory  variability, suggesting right atrial pressure of 8 mmHg.    Past Medical History:  Diagnosis Date  . Acute CHF (congestive heart failure) ([place]) 05/25/2019  . Anemia associated with stage 4 chronic renal failure ([place]) 05/17/2019  . Aortic stenosis 02/14/2019  . Arthritis   . CAD (coronary artery disease) 08/16/2018  . Chronic kidney disease   . Chronic systolic dysfunction of left ventricle   . CKD stage G4/A1, GFR 15-29 and albumin creatinine ratio <30 mg/g ([facility]) 05/17/2019  . Coronary artery disease   . Diabetes mellitus without complication ([place])   . Diverticulitis   . History of MI (myocardial infarction) 08/16/2018  . HTN (hypertension) 08/16/2018  . Hyperlipidemia associated with type 2 diabetes mellitus ([place]) 08/16/2018  . Hypertension   . Hypothyroid 08/16/2018  . Hypothyroidism   . ICD (implantable cardioverter-defibrillator) in place 07/06/2019  . Malignant neoplasm of upper-outer quadrant of left breast in female, estrogen receptor positive ([place]) 03/02/2019  . Mitral regurgitation moderate to severe based on echocardiogram in summer 2020 11/04/2018  . Myocardial infarction ([place])   . Pacemaker 06/29/2019  . Paroxysmal atrial fibrillation (  [place])   . Pleural effusion on left 06/29/2019  . Sinus bradycardia 06/17/2019  . Thyroid disease   .  Transaminitis 06/17/2019  . Type 2 diabetes mellitus with other circulatory complications ([place]) 9/44/9675  . V-[facility]) 06/17/2019    Past Surgical History:  Procedure Laterality Date  . APPENDECTOMY  1936  . ICD IMPLANT N/A 06/20/2019   Procedure: ICD IMPLANT;  Surgeon: Evans Lance, MD;  Location: [facility];  Service: Cardiovascular;  Laterality: N/A;  . IR THORACENTESIS ASP PLEURAL SPACE W/IMG GUIDE  05/27/2019  . PARS PLANA VITRECTOMY Right 12/04/2018   Procedure: RETINAL DETACHMENT REPAIR PPV 25 GAUGE WITH ENDO LASER AIR/FLUID EXCHANGE SF6 GAS INJECTION;  Surgeon: Jalene Mullet, MD;  Location: [facility];  Service: Ophthalmology;  Laterality: Right;  . TONSILLECTOMY       Medications Prior to Admission: Prior to Admission medications   Medication Sig Start Date End Date Taking? Authorizing Provider  acetaminophen (TYLENOL) 650 MG CR tablet Take 650 mg by mouth every 8 (eight) hours as needed for pain.    [provider]  amLODipine (NORVASC) 2.5 MG tablet Take 1 tablet (2.5 mg total) by mouth daily. 10/20/19   Drenda Freeze, MD  anastrozole (ARIMIDEX) 1 MG tablet TAKE 1 TABLET BY MOUTH EVERY DAY 09/15/19   Magrinat, Virgie Dad, MD  apixaban (ELIQUIS) 2.5 MG TABS tablet Take 1 tablet (2.5 mg total) by mouth 2 (two) times daily. 03/10/19   Loel Dubonnet, NP  atorvastatin (LIPITOR) 40 MG tablet TAKE 1 TABLET BY MOUTH EVERY DAY 10/18/19   Midge Minium, MD  Azelastine-Fluticasone 137-50 MCG/ACT SUSP Place 1 spray into both nostrils 2 (two) times daily.     [provider]  b complex vitamins capsule Take 1 capsule by mouth daily.    [provider]  Biotin 1 MG CAPS Take 1 mg by mouth daily.     [provider]  carvedilol (COREG) 3.125 MG tablet TAKE 1 TABLET (3.125 MG TOTAL) BY MOUTH 2 (TWO) TIMES DAILY WITH A MEAL. 07/13/19 10/21/19  Midge Minium, MD  Cholecalciferol (VITAMIN D3) 50 MCG (2000 UT) TABS Take 2,000 Units by mouth at  bedtime.     [provider]  Ferrous Sulfate (IRON) 325 (65 Fe) MG TABS Take 325 mg by mouth daily with breakfast.     [provider]  fluticasone (FLONASE) 50 MCG/ACT nasal spray Place 2 sprays into both nostrils daily. 10/15/19   [provider]  isosorbide dinitrate (ISORDIL) 20 MG tablet TAKE 2 TABLETS (40MG ) BY MOUTH TWICE DAILY 10/14/19   Midge Minium, MD  levothyroxine (SYNTHROID) 150 MCG tablet TAKE 1 TABLET BY MOUTH DAILY BEFORE BREAKFAST. 08/25/19   Midge Minium, MD  magnesium oxide (MAG-OX) 400 MG tablet Take 400 mg by mouth at bedtime.     [provider]  nitroGLYCERIN (NITROSTAT) 0.4 MG SL tablet Place 1 tablet (0.4 mg total) under the tongue every 5 (five) minutes as needed for chest pain. 09/28/19   Midge Minium, MD  quinapril (ACCUPRIL) 10 MG tablet TAKE 1 TABLET BY MOUTH TWICE A DAY 09/26/19   Midge Minium, MD  torsemide (DEMADEX) 20 MG tablet Take 1.5 tablets (30 mg total) by mouth daily. 08/05/19   Park Liter, MD  vitamin C (ASCORBIC ACID) 500 MG tablet Take 500 mg by mouth daily.    [provider]     Allergies:    Allergies  Allergen  Reactions  . Amiodarone Other (See Comments)    Prolonged QT, VT  . Aranesp (Albumin Free) [Darbepoetin Alfa] Other (See Comments)    Patient had cardiac arrest evening after receiving this  . Tape Itching, Rash and Other (See Comments)    Reaction was from the surgical tape from cyst removed    Social History:   Social History   Socioeconomic History  . Marital status: Married    Spouse name: Not on file  . Number of children: Not on file  . Years of education: Not on file  . Highest education level: Not on file  Occupational History  . Not on file  Tobacco Use  . Smoking status: Former Research scientist (life sciences)  . Smokeless tobacco: Never Used  Vaping Use  . Vaping Use: Never used  Substance and Sexual Activity  . Alcohol use: Yes    Comment: Very rare  . Drug use:  Not Currently  . Sexual activity: Not Currently  Other Topics Concern  . Not on file  Social History Narrative  . Not on file   Social Determinants of Health   Financial Resource Strain:   . Difficulty of Paying Living Expenses: Not on file  Food Insecurity:   . Worried About Charity fundraiser in the Last Year: Not on file  . Ran Out of Food in the Last Year: Not on file  Transportation Needs:   . Lack of Transportation (Medical): Not on file  . Lack of Transportation (Non-Medical): Not on file  Physical Activity:   . Days of Exercise per Week: Not on file  . Minutes of Exercise per Session: Not on file  Stress:   . Feeling of Stress : Not on file  Social Connections:   . Frequency of Communication with Friends and Family: Not on file  . Frequency of Social Gatherings with Friends and Family: Not on file  . Attends Religious Services: Not on file  . Active Member of Clubs or Organizations: Not on file  . Attends Archivist Meetings: Not on file  . Marital Status: Not on file  Intimate Partner Violence:   . Fear of Current or Ex-Partner: Not on file  . Emotionally Abused: Not on file  . Physically Abused: Not on file  . Sexually Abused: Not on file     Family History:   The patient's family history includes Arthritis in her daughter, father, mother, and son; Depression in her maternal aunt; Heart attack in her father; Heart disease in her father and mother; Hyperlipidemia in her maternal aunt; Hypertension in her maternal aunt and mother.     Review of Systems: [y] = yes, [ ]  = no   . General: Weight gain [ ] ; Weight loss [ ] ; Anorexia [ ] ; Fatigue [ ] ; Fever [ ] ; Chills [ ] ; Weakness [ ]   . Cardiac: Chest pain/pressure [Y]; Resting SOB [Y]; Exertional SOB [Y]; Orthopnea [ ] ; Pedal Edema [ ] ; Palpitations [Y]; Syncope [ ] ; Presyncope [ ] ; Paroxysmal nocturnal dyspnea[ ]   . Pulmonary: Cough [ ] ; Wheezing[ ] ; Hemoptysis[ ] ; Sputum [ ] ; Snoring [ ]   . GI:  Vomiting[ ] ; Dysphagia[ ] ; Melena[ ] ; Hematochezia [ ] ; Heartburn[ ] ; Abdominal pain [ ] ; Constipation [ ] ; Diarrhea [ ] ; BRBPR [ ]   . GU: Hematuria[ ] ; Dysuria [ ] ; Nocturia[ ]   . Vascular: Pain in legs with walking [ ] ; Pain in feet with lying flat [ ] ; Non-healing sores [ ] ; Stroke [ ] ; TIA [ ] ;  Slurred speech [ ] ;  . Neuro: Headaches[ ] ; Vertigo[ ] ; Seizures[ ] ; Paresthesias[ ] ;Blurred vision [ ] ; Diplopia [ ] ; Vision changes [ ]   . Ortho/Skin: Arthritis [ ] ; Joint pain [ ] ; Muscle pain [ ] ; Joint swelling [ ] ; Back Pain [ ] ; Rash [ ]   . Psych: Depression[ ] ; Anxiety[ ]   . Heme: Bleeding problems [ ] ; Clotting disorders [ ] ; Anemia [ ]   . Endocrine: Diabetes [ ] ; Thyroid dysfunction. Chest x-ray showed   Physical Exam/Data:   Vitals:   10/21/19 1915 10/21/19 2000 10/21/19 2046 10/21/19 2254  BP: 110/68 116/75    Pulse: (!) 127 (!) 110  95  Resp: 20 19  18   Temp:   98.3 F (36.8 C) 98.6 F (37 C)  TempSrc:   Oral Oral  SpO2: 100% 98%  99%  Weight:    75.1 kg  Height:       No intake or output data in the 24 hours ending 10/21/19 2329 Filed Weights   10/21/19 1703 10/21/19 2254  Weight: 74.4 kg 75.1 kg   Body mass index is 28.41 kg/m.  General:  Well nourished, well developed, in no acute distress HEENT: normal Lymph: no adenopathy Neck: no JVD Endocrine:  No thryomegaly Vascular: No carotid bruits; FA pulses 2+ bilaterally without bruits  Cardiac:   Irregular irregular, holosystolic murmur grade 2/6 best heard at apex, no rubs, no gallops Lungs:  clear to auscultation bilaterally, no wheezing, rhonchi or rales  Abd: soft, nontender, no hepatomegaly  Ext: no edema Musculoskeletal:  No deformities, BUE and BLE strength normal and equal Skin: warm and dry  Neuro:  CNs 2-12 intact, no focal abnormalities noted Psych:  Normal affect   Laboratory Data:  Chemistry Recent Labs  Lab 10/20/19 1544 10/21/19 1719  NA 140 131*  K 4.0 4.0  CL 106 98  CO2 23 23  GLUCOSE  169* 172*  BUN 64* 68*  CREATININE 1.94* 2.26*  CALCIUM 8.4* 9.0  GFRNONAA 23* 19*  GFRAA 26* 22*  ANIONGAP 11 10    Recent Labs  Lab 10/20/19 1544  PROT 5.6*  ALBUMIN 2.9*  AST 21  ALT 16  ALKPHOS 40  BILITOT 0.6   Hematology Recent Labs  Lab 10/20/19 1544 10/21/19 1719  WBC 9.8 12.6*  RBC 3.25* 3.54*  HGB 9.4* 10.6*  HCT 31.1* 32.9*  MCV 95.7 92.9  MCH 28.9 29.9  MCHC 30.2 32.2  RDW 12.6 12.9  PLT 169 226   Cardiac EnzymesNo results for input(s): TROPONINI in the last 168 hours. No results for input(s): TROPIPOC in the last 168 hours.  BNP Recent Labs  Lab 10/20/19 1544  BNP 516.6*    DDimer No results for input(s): DDIMER in the last 168 hours.  Radiology/Studies:  No results found.  Assessment and Plan:   1. Atrial fibrillation with rapid ventricular rate The patient has a history of paroxysmal atrial fibrillation. She now presents to the hospital in A. fib with RVR. The high-sensitivity troponins are negative. She failed electrical cardioversion x3 at [facility].  -Initial rate control with AV nodal blockers such as diltiazem IV (bolus/infusion) -Daily weights -Strict I and O's -Maintain serum K >4.0 and Mg >2.0 -Can consider amiodarone if the patient continues in A. fib with RVR (though in her medical records there is a mention of prolonged QT with amiodarone in the past) -Continue Eliquis     Severity of Illness: The appropriate patient status for this patient is  INPATIENT. Inpatient status is judged to be reasonable and necessary in order to provide the required intensity of service to ensure the patient's safety. The patient's presenting symptoms, physical exam findings, and initial radiographic and laboratory data in the context of their chronic comorbidities is felt to place them at high risk for further clinical deterioration. Furthermore, it is not anticipated that the patient will be medically stable for discharge from the hospital  within 2 midnights of admission. The following factors support the patient status of inpatient.   " The patient's presenting symptoms include palpitations. " The worrisome physical exam findings include tachycardia. " The initial radiographic and laboratory data are worrisome because of abnormal ECG. " The chronic co-morbidities include congestive heart failure.   * I certify that at the point of admission it is my clinical judgment that the patient will require inpatient hospital care spanning beyond 2 midnights from the point of admission due to high intensity of service, high risk for further deterioration and high frequency of surveillance required.*    For questions or updates, please contact [facility] Please consult [email or web address] for contact info under Cardiology/STEMI.    Signed, Meade Maw, MD  10/21/2019 11:29 PM

## 2019-10-21 NOTE — Progress Notes (Signed)
Patient has arrived from [facility], denies chest pain.  Cardizem infusing at 10mg /hr on arrival to [place].  Cardiology paged.

## 2019-10-21 NOTE — Sedation Documentation (Signed)
Unable to rate pain due to procedural sedation.

## 2019-10-21 NOTE — Progress Notes (Signed)
Cardiology Office Note:    Date:  10/21/2019   ID:  Lauren Walker, DOB 1929-11-24, MRN [id number]  PCP:  Lauren Minium, MD  Cardiologist:  Lauren Campus, MD    Referring MD: Lauren Minium, MD   Chief Complaint  Patient presents with   Follow-up  I was in the emergency room yesterday because of chest pain  History of Present Illness:    Lauren Walker is a 86 y.o. female with past medical history significant for breast CA only on hormonal therapy, chronic both systolic and diastolic congestive heart failure, remote history of coronary artery disease, paroxysmal atrial fibrillation, moderate to severe mitral regurgitation, chronic kidney failure, essential hypertension, dyslipidemia, hypothyroidism.  She also have an ICD present.  Yesterday she ended going to the emergency room the reason for visit in the emergency room was chest pain she described pain located in the lower portion of her sternum pain is worse with taking deep breath pain is worse with pressing, she ruled out for myocardial infarction she was discharged home with extra antianginal therapy.  She was given amlodipine.  She said she was fine after that at home however woke up this morning and again have a chest pain.  She came to my office.  She is clearly short of breath during my physical exam.  She is also tachycardic.  EKG showed presence of atrial fibrillation with poorly controlled ventricular rate on top of that she does have some ischemic changes on the EKG.  She was brought to the emergency room.  Past Medical History:  Diagnosis Date   Acute CHF (congestive heart failure) ([place]) 05/25/2019   Anemia associated with stage 4 chronic renal failure ([place]) 05/17/2019   Aortic stenosis 02/14/2019   Arthritis    CAD (coronary artery disease) 08/16/2018   Chronic kidney disease    Chronic systolic dysfunction of left ventricle    CKD stage G4/A1, GFR 15-29 and albumin creatinine ratio <30 mg/g ([place])  05/17/2019   Coronary artery disease    Diabetes mellitus without complication (HCC)    Diverticulitis    History of MI (myocardial infarction) 08/16/2018   HTN (hypertension) 08/16/2018   Hyperlipidemia associated with type 2 diabetes mellitus ([place]) 08/16/2018   Hypertension    Hypothyroid 08/16/2018   Hypothyroidism    ICD (implantable cardioverter-defibrillator) in place 07/06/2019   Malignant neoplasm of upper-outer quadrant of left breast in female, estrogen receptor positive ([place]) 03/02/2019   Mitral regurgitation moderate to severe based on echocardiogram in summer 2020 11/04/2018   Myocardial infarction [facility])    Pacemaker 06/29/2019   Paroxysmal atrial fibrillation (HCC)    Pleural effusion on left 06/29/2019   Sinus bradycardia 06/17/2019   Thyroid disease    Transaminitis 06/17/2019   Type 2 diabetes mellitus with other circulatory complications ([place]) 04/17/6008   V-tach ([place]) 06/17/2019    Past Surgical History:  Procedure Laterality Date   APPENDECTOMY  1936   ICD IMPLANT N/A 06/20/2019   Procedure: ICD IMPLANT;  Surgeon: Evans Lance, MD;  Location: [facility];  Service: Cardiovascular;  Laterality: N/A;   IR THORACENTESIS ASP PLEURAL SPACE W/IMG GUIDE  05/27/2019   PARS PLANA VITRECTOMY Right 12/04/2018   Procedure: RETINAL DETACHMENT REPAIR PPV 25 GAUGE WITH ENDO LASER AIR/FLUID EXCHANGE SF6 GAS INJECTION;  Surgeon: Jalene Mullet, MD;  Location: [facility];  Service: Ophthalmology;  Laterality: Right;   TONSILLECTOMY      Current Medications: Current Meds  Medication Sig  acetaminophen (TYLENOL) 650 MG CR tablet Take 650 mg by mouth every 8 (eight) hours as needed for pain.   amLODipine (NORVASC) 2.5 MG tablet Take 1 tablet (2.5 mg total) by mouth daily.   anastrozole (ARIMIDEX) 1 MG tablet TAKE 1 TABLET BY MOUTH EVERY DAY   apixaban (ELIQUIS) 2.5 MG TABS tablet Take 1 tablet (2.5 mg total) by mouth 2 (two) times daily.   atorvastatin  (LIPITOR) 40 MG tablet TAKE 1 TABLET BY MOUTH EVERY DAY   Azelastine-Fluticasone 137-50 MCG/ACT SUSP Place 1 spray into both nostrils 2 (two) times daily.    b complex vitamins capsule Take 1 capsule by mouth daily.   Biotin 1 MG CAPS Take 1 mg by mouth daily.    carvedilol (COREG) 3.125 MG tablet TAKE 1 TABLET (3.125 MG TOTAL) BY MOUTH 2 (TWO) TIMES DAILY WITH A MEAL.   Cholecalciferol (VITAMIN D3) 50 MCG (2000 UT) TABS Take 2,000 Units by mouth at bedtime.    Ferrous Sulfate (IRON) 325 (65 Fe) MG TABS Take 325 mg by mouth daily with breakfast.    fluticasone (FLONASE) 50 MCG/ACT nasal spray Place 2 sprays into both nostrils daily.   isosorbide dinitrate (ISORDIL) 20 MG tablet TAKE 2 TABLETS (40MG ) BY MOUTH TWICE DAILY   levothyroxine (SYNTHROID) 150 MCG tablet TAKE 1 TABLET BY MOUTH DAILY BEFORE BREAKFAST.   magnesium oxide (MAG-OX) 400 MG tablet Take 400 mg by mouth at bedtime.    nitroGLYCERIN (NITROSTAT) 0.4 MG SL tablet Place 1 tablet (0.4 mg total) under the tongue every 5 (five) minutes as needed for chest pain.   quinapril (ACCUPRIL) 10 MG tablet TAKE 1 TABLET BY MOUTH TWICE A DAY   torsemide (DEMADEX) 20 MG tablet Take 1.5 tablets (30 mg total) by mouth daily.   vitamin C (ASCORBIC ACID) 500 MG tablet Take 500 mg by mouth daily.     Allergies:   Amiodarone, Aranesp (albumin free) [darbepoetin alfa], and Tape   Social History   Socioeconomic History   Marital status: Married    Spouse name: Not on file   Number of children: Not on file   Years of education: Not on file   Highest education level: Not on file  Occupational History   Not on file  Tobacco Use   Smoking status: Former Smoker   Smokeless tobacco: Never Used  Scientific laboratory technician Use: Never used  Substance and Sexual Activity   Alcohol use: Yes    Comment: Very rare   Drug use: Not Currently   Sexual activity: Not Currently  Other Topics Concern   Not on file  Social History  Narrative   Not on file   Social Determinants of Health   Financial Resource Strain:    Difficulty of Paying Living Expenses: Not on file  Food Insecurity:    Worried About Charity fundraiser in the Last Year: Not on file   [place] in the Last Year: Not on file  Transportation Needs:    Lack of Transportation (Medical): Not on file   Lack of Transportation (Non-Medical): Not on file  Physical Activity:    Days of Exercise per Week: Not on file   Minutes of Exercise per Session: Not on file  Stress:    Feeling of Stress : Not on file  Social Connections:    Frequency of Communication with Friends and Family: Not on file   Frequency of Social Gatherings with Friends and Family: Not on file  Attends Religious Services: Not on file   Active Member of Clubs or Organizations: Not on file   Attends Archivist Meetings: Not on file   Marital Status: Not on file     Family History: The patient's family history includes Arthritis in her daughter, father, mother, and son; Depression in her maternal aunt; Heart attack in her father; Heart disease in her father and mother; Hyperlipidemia in her maternal aunt; Hypertension in her maternal aunt and mother. ROS:   Please see the history of present illness.    All 14 point review of systems negative except as described per history of present illness  EKGs/Labs/Other Studies Reviewed:      Recent Labs: 05/25/2019: TSH 1.130 06/17/2019: Magnesium 2.4 09/02/2019: NT-Pro BNP 12,348 10/20/2019: ALT 16; B Natriuretic Peptide 516.6; BUN 64; Creatinine, Ser 1.94; Hemoglobin 9.4; Platelets 169; Potassium 4.0; Sodium 140  Recent Lipid Panel    Component Value Date/Time   CHOL 109 12/29/2018 1415   TRIG 154.0 (H) 12/29/2018 1415   HDL 48.40 12/29/2018 1415   CHOLHDL 2 12/29/2018 1415   VLDL 30.8 12/29/2018 1415   LDLCALC 30 12/29/2018 1415    Physical Exam:    VS:  BP 100/60 (BP Location: Right Arm, Patient  Position: Sitting, Cuff Size: Normal)    Pulse (!) 104    Ht 5\' 4"  (1.626 m)    Wt 164 lb (74.4 kg)    SpO2 98%    BMI 28.15 kg/m     Wt Readings from Last 3 Encounters:  10/21/19 164 lb (74.4 kg)  10/20/19 162 lb 11.2 oz (73.8 kg)  09/23/19 162 lb 9.6 oz (73.8 kg)     GEN:  Well nourished, well developed in no acute distress HEENT: Normal NECK: No JVD; No carotid bruits LYMPHATICS: No lymphadenopathy CARDIAC: Irregular irregular, holosystolic murmur grade 2/6 best heard at apex, no rubs, no gallops RESPIRATORY:  Clear to auscultation without rales, wheezing or rhonchi  ABDOMEN: Soft, non-tender, non-distended MUSCULOSKELETAL:  No edema; No deformity  SKIN: Warm and dry LOWER EXTREMITIES: no swelling NEUROLOGIC:  Alert and oriented x 3 PSYCHIATRIC:  Normal affect   ASSESSMENT:    1. Paroxysmal atrial fibrillation (HCC)   2. V-tach ([place])   3. Nonrheumatic mitral valve regurgitation   4. Essential hypertension   5. Coronary artery disease involving native coronary artery of native heart without angina pectoris   6. History of MI (myocardial infarction)   7. ICD (implantable cardioverter-defibrillator) in place    PLAN:    In order of problems listed above:  1. Paroxysmal atrial fibrillation.  Today she does have atrial fibrillation with poorly controlled ventricular rate.  She is anticoagulant with Eliquis 2.5 mg daily she did have some episode of chest pain today when I am pressing her chest it hurts a little bit also pressing epigastric increasing problems.  Again she will be brought to the emergency room and I think the best medication for somebody like care will be amiodarone especially considering the fact that her blood pressure is soft 100/60.  If she became hemodynamically unstable we may be forced to do electrical cardioversion. 2. History of V. tach.  Denies having any dizziness passing out. 3. Mitral regurgitation which is moderate to severe medical management for now  this is secondary mitral valve regurgitation. 4. Remote history of coronary artery disease yesterday troponins signs high-sensitivity was 18.  Flat to numbers the same, will continue monitoring. 5. ICD present.  Noted.  Last interrogation  reviewed.   Medication Adjustments/Labs and Tests Ordered: Current medicines are reviewed at length with the patient today.  Concerns regarding medicines are outlined above.  No orders of the defined types were placed in this encounter.  Medication changes: No orders of the defined types were placed in this encounter.   Signed, Park Liter, MD, [facility] 10/21/2019 4:38 PM    [place]

## 2019-10-21 NOTE — Sedation Documentation (Signed)
Continues to be in afib.  Shocked again 200J.

## 2019-10-21 NOTE — ED Triage Notes (Signed)
Pt was sent by her cardiologist to the ER for atrial fib/RVR. Denies any cp/sob. Pt states she does have a pacemaker. Denies any complaints at present. States she was seen at the [facility] ED yesterday for chest pain.

## 2019-10-21 NOTE — ED Provider Notes (Signed)
[facility] EMERGENCY DEPARTMENT Provider Note   CSN: [id number] Arrival date & time: 10/21/19  1631     History Chief Complaint  Patient presents with  . Atrial Fib/RVR    Lauren Walker is a 86 y.o. female.  86 yo F with a chief complaints of chest pain.  Was seen in the ED yesterday for the same.  Had to troponins that were unchanged and was sent to see her cardiologist today.  In the office she was in atrial fibrillation with RVR and then he brought her down to the ED for evaluation.  She is having ongoing symptoms similar to yesterday.  Prior to yesterday she stated that she was in her normal state of health.  She has been taking her Eliquis as prescribed and has not missed any doses.  She denies cough congestion or fever denies nausea vomiting or diarrhea.  Has been eating and drinking normally.  The history is provided by the patient.  Illness Severity:  Moderate Onset quality:  Gradual Duration:  2 days Timing:  Constant Progression:  Worsening Chronicity:  New Associated symptoms: chest pain   Associated symptoms: no congestion, no fever, no headaches, no myalgias, no nausea, no rhinorrhea, no shortness of breath, no vomiting and no wheezing        Past Medical History:  Diagnosis Date  . Acute CHF (congestive heart failure) ([place]) 05/25/2019  . Anemia associated with stage 4 chronic renal failure ([place]) 05/17/2019  . Aortic stenosis 02/14/2019  . Arthritis   . CAD (coronary artery disease) 08/16/2018  . Chronic kidney disease   . Chronic systolic dysfunction of left ventricle   . CKD stage G4/A1, GFR 15-29 and albumin creatinine ratio <30 mg/g (HCC) 05/17/2019  . Coronary artery disease   . Diabetes mellitus without complication ([place])   . Diverticulitis   . History of MI (myocardial infarction) 08/16/2018  . HTN (hypertension) 08/16/2018  . Hyperlipidemia associated with type 2 diabetes mellitus ([place]) 08/16/2018  . Hypertension   . Hypothyroid 08/16/2018  .  Hypothyroidism   . ICD (implantable cardioverter-defibrillator) in place 07/06/2019  . Malignant neoplasm of upper-outer quadrant of left breast in female, estrogen receptor positive ([place]) 03/02/2019  . Mitral regurgitation moderate to severe based on echocardiogram in summer 2020 11/04/2018  . Myocardial infarction ([place])   . Pacemaker 06/29/2019  . Paroxysmal atrial fibrillation (HCC)   . Pleural effusion on left 06/29/2019  . Sinus bradycardia 06/17/2019  . Thyroid disease   . Transaminitis 06/17/2019  . Type 2 diabetes mellitus with other circulatory complications ([place]) 3/82/5053  . V-tach [facility]) 06/17/2019    Patient Active Problem List   Diagnosis Date Noted  . Chest pain of uncertain etiology 97/67/3419  . Atrial fibrillation ([place]) 10/21/2019  . Arthritis of left hip 09/19/2019  . ICD (implantable cardioverter-defibrillator) in place 07/06/2019  . Pleural effusion on left 06/29/2019  . Pacemaker 06/29/2019  . V-tach ([place]) 06/17/2019  . Sinus bradycardia 06/17/2019  . Transaminitis 06/17/2019  . Acute CHF (congestive heart failure) ([place]) 05/25/2019  . CKD stage G4/A1, GFR 15-29 and albumin creatinine ratio <30 mg/g (HCC) 05/17/2019  . Anemia associated with stage 4 chronic renal failure ([place]) 05/17/2019  . Malignant neoplasm of upper-outer quadrant of left breast in female, estrogen receptor positive ([place]) 03/02/2019  . Aortic stenosis 02/14/2019  . Mitral regurgitation moderate to severe based on echocardiogram in summer 2020 11/04/2018  . Paroxysmal atrial fibrillation ([place]) 10/11/2018  . Hyperlipidemia associated with type 2  diabetes mellitus ([place]) 08/16/2018  . Type 2 diabetes mellitus with other circulatory complications ([place]) 61/60/7371  . Hypothyroid 08/16/2018  . HTN (hypertension) 08/16/2018  . CAD (coronary artery disease) 08/16/2018  . History of MI (myocardial infarction) 08/16/2018    Past Surgical History:  Procedure Laterality Date  . APPENDECTOMY  1936  . ICD  IMPLANT N/A 06/20/2019   Procedure: ICD IMPLANT;  Surgeon: Evans Lance, MD;  Location: [facility];  Service: Cardiovascular;  Laterality: N/A;  . IR THORACENTESIS ASP PLEURAL SPACE W/IMG GUIDE  05/27/2019  . PARS PLANA VITRECTOMY Right 12/04/2018   Procedure: RETINAL DETACHMENT REPAIR PPV 25 GAUGE WITH ENDO LASER AIR/FLUID EXCHANGE SF6 GAS INJECTION;  Surgeon: Jalene Mullet, MD;  Location: [facility];  Service: Ophthalmology;  Laterality: Right;  . TONSILLECTOMY       OB History   No obstetric history on file.     Family History  Problem Relation Age of Onset  . Arthritis Mother   . Heart disease Mother   . Hypertension Mother   . Arthritis Father   . Heart attack Father   . Heart disease Father   . Arthritis Daughter   . Arthritis Son   . Depression Maternal Aunt   . Hyperlipidemia Maternal Aunt   . Hypertension Maternal Aunt     Social History   Tobacco Use  . Smoking status: Former Research scientist (life sciences)  . Smokeless tobacco: Never Used  Vaping Use  . Vaping Use: Never used  Substance Use Topics  . Alcohol use: Yes    Comment: Very rare  . Drug use: Not Currently    Home Medications Prior to Admission medications   Medication Sig Start Date End Date Taking? Authorizing Provider  acetaminophen (TYLENOL) 650 MG CR tablet Take 650 mg by mouth every 8 (eight) hours as needed for pain.    [provider]  amLODipine (NORVASC) 2.5 MG tablet Take 1 tablet (2.5 mg total) by mouth daily. 10/20/19   Drenda Freeze, MD  anastrozole (ARIMIDEX) 1 MG tablet TAKE 1 TABLET BY MOUTH EVERY DAY 09/15/19   Magrinat, Virgie Dad, MD  apixaban (ELIQUIS) 2.5 MG TABS tablet Take 1 tablet (2.5 mg total) by mouth 2 (two) times daily. 03/10/19   Loel Dubonnet, NP  atorvastatin (LIPITOR) 40 MG tablet TAKE 1 TABLET BY MOUTH EVERY DAY 10/18/19   Midge Minium, MD  Azelastine-Fluticasone 137-50 MCG/ACT SUSP Place 1 spray into both nostrils 2 (two) times daily.     [provider]    b complex vitamins capsule Take 1 capsule by mouth daily.    [provider]  Biotin 1 MG CAPS Take 1 mg by mouth daily.     [provider]  carvedilol (COREG) 3.125 MG tablet TAKE 1 TABLET (3.125 MG TOTAL) BY MOUTH 2 (TWO) TIMES DAILY WITH A MEAL. 07/13/19 10/21/19  Midge Minium, MD  Cholecalciferol (VITAMIN D3) 50 MCG (2000 UT) TABS Take 2,000 Units by mouth at bedtime.     [provider]  Ferrous Sulfate (IRON) 325 (65 Fe) MG TABS Take 325 mg by mouth daily with breakfast.     [provider]  fluticasone (FLONASE) 50 MCG/ACT nasal spray Place 2 sprays into both nostrils daily. 10/15/19   [provider]  isosorbide dinitrate (ISORDIL) 20 MG tablet TAKE 2 TABLETS (40MG ) BY MOUTH TWICE DAILY 10/14/19   Midge Minium, MD  levothyroxine (SYNTHROID) 150 MCG tablet TAKE 1 TABLET BY MOUTH DAILY BEFORE  BREAKFAST. 08/25/19   Midge Minium, MD  magnesium oxide (MAG-OX) 400 MG tablet Take 400 mg by mouth at bedtime.     [provider]  nitroGLYCERIN (NITROSTAT) 0.4 MG SL tablet Place 1 tablet (0.4 mg total) under the tongue every 5 (five) minutes as needed for chest pain. 09/28/19   Midge Minium, MD  quinapril (ACCUPRIL) 10 MG tablet TAKE 1 TABLET BY MOUTH TWICE A DAY 09/26/19   Midge Minium, MD  torsemide (DEMADEX) 20 MG tablet Take 1.5 tablets (30 mg total) by mouth daily. 08/05/19   Park Liter, MD  vitamin C (ASCORBIC ACID) 500 MG tablet Take 500 mg by mouth daily.    [provider]    Allergies    Amiodarone, Aranesp (albumin free) [darbepoetin alfa], and Tape  Review of Systems   Review of Systems  Constitutional: Negative for chills and fever.  HENT: Negative for congestion and rhinorrhea.   Eyes: Negative for redness and visual disturbance.  Respiratory: Negative for shortness of breath and wheezing.   Cardiovascular: Positive for chest pain. Negative for palpitations.  Gastrointestinal:  Negative for nausea and vomiting.  Genitourinary: Negative for dysuria and urgency.  Musculoskeletal: Negative for arthralgias and myalgias.  Skin: Negative for pallor and wound.  Neurological: Negative for dizziness and headaches.    Physical Exam Updated Vital Signs BP 98/70 (BP Location: Right Arm)   Pulse (!) 127   Temp 97.6 F (36.4 C) (Oral)   Resp (!) 21   Ht 5\' 4"  (1.626 m)   Wt 74.4 kg   SpO2 100%   BMI 28.15 kg/m   Physical Exam Vitals and nursing note reviewed.  Constitutional:      General: She is not in acute distress.    Appearance: She is well-developed. She is not diaphoretic.  HENT:     Head: Normocephalic and atraumatic.  Eyes:     Pupils: Pupils are equal, round, and reactive to light.  Cardiovascular:     Rate and Rhythm: Tachycardia present. Rhythm irregular.     Heart sounds: No murmur heard.  No friction rub. No gallop.   Pulmonary:     Effort: Pulmonary effort is normal.     Breath sounds: No wheezing or rales.  Abdominal:     General: There is no distension.     Palpations: Abdomen is soft.     Tenderness: There is no abdominal tenderness.  Musculoskeletal:        General: No tenderness.     Cervical back: Normal range of motion and neck supple.  Skin:    General: Skin is warm and dry.  Neurological:     Mental Status: She is alert and oriented to person, place, and time.  Psychiatric:        Behavior: Behavior normal.     ED Results / Procedures / Treatments   Labs (all labs ordered are listed, but only abnormal results are displayed) Labs Reviewed  CBC WITH DIFFERENTIAL/PLATELET - Abnormal; Notable for the following components:      Result Value   WBC 12.6 (*)    RBC 3.54 (*)    Hemoglobin 10.6 (*)    HCT 32.9 (*)    Neutro Abs 8.5 (*)    Monocytes Absolute 1.5 (*)    All other components within normal limits  BASIC METABOLIC PANEL - Abnormal; Notable for the following components:   Sodium 131 (*)    Glucose, Bld 172 (*)  BUN 68 (*)    Creatinine, Ser 2.26 (*)    GFR calc non Af Amer 19 (*)    GFR calc Af Amer 22 (*)    All other components within normal limits  SARS CORONAVIRUS 2 BY RT PCR [facility] ORDER, [facility])  MAGNESIUM    EKG EKG Interpretation  Date/Time:  Friday October 21 2019 17:04:41 EDT Ventricular Rate:  125 PR Interval:    QRS Duration: 116 QT Interval:  365 QTC Calculation: 527 R Axis:   -122 Text Interpretation: Atrial fibrillation Nonspecific intraventricular conduction delay Probable lateral infarct, age indeterminate Since last tracing rate faster Otherwise no significant change Confirmed by Deno Etienne [phone]) on 10/21/2019 5:07:52 PM   Radiology DG Chest 2 View  Result Date: 10/20/2019 CLINICAL DATA:  Chest pain EXAM: CHEST - 2 VIEW COMPARISON:  06/24/2019 FINDINGS: Tiny left pleural effusion is present, decreased in size since prior examination. Lungs are well expanded and are otherwise clear. No pneumothorax. Right subclavian pacemaker defibrillator is unchanged. Cardiac size is mildly enlarged, unchanged. Pulmonary vascularity is normal. No acute bone abnormality. IMPRESSION: 1. Tiny left pleural effusion, decreased in size since prior examination. 2. Stable mild cardiomegaly. Electronically Signed   By: Fidela Salisbury MD   On: 10/20/2019 15:45    Procedures .Cardioversion  Date/Time: 10/21/2019 6:02 PM Performed by: Deno Etienne, DO Authorized by: Deno Etienne, DO   Consent:    Consent obtained:  Written   Consent given by:  Patient   Risks discussed:  Cutaneous burn, death and induced arrhythmia   Alternatives discussed:  No treatment, rate-control medication, alternative treatment and observation Pre-procedure details:    Cardioversion basis:  Elective   Rhythm:  Atrial fibrillation   Electrode placement:  Anterior-posterior Patient sedated: Yes. Refer to sedation procedure documentation for details of sedation.  Attempt one:     Cardioversion mode:  Synchronous   Waveform:  Biphasic   Shock (Joules):  300   Shock outcome:  No change in rhythm Post-procedure details:    Patient status:  Awake   Patient tolerance of procedure:  Tolerated well, no immediate complications .Sedation  Date/Time: 10/21/2019 6:03 PM Performed by: Deno Etienne, DO Authorized by: Deno Etienne, DO   Consent:    Consent obtained:  Verbal   Consent given by:  Patient   Risks discussed:  Allergic reaction, dysrhythmia, inadequate sedation, nausea, prolonged hypoxia resulting in organ damage, prolonged sedation necessitating reversal, respiratory compromise necessitating ventilatory assistance and intubation and vomiting   Alternatives discussed:  Analgesia without sedation, anxiolysis and regional anesthesia Universal protocol:    Procedure explained and questions answered to patient or proxy's satisfaction: yes     Relevant documents present and verified: yes     Test results available and properly labeled: yes     Imaging studies available: yes     Required blood products, implants, devices, and special equipment available: yes     Site/side marked: yes     Immediately prior to procedure a time out was called: yes     Patient identity confirmation method:  Verbally with patient Indications:    Procedure necessitating sedation performed by:  Physician performing sedation Pre-sedation assessment:    Time since last food or drink:  6   ASA classification: class 1 - normal, healthy patient     Neck mobility: normal     Mouth opening:  3 or more finger widths   Thyromental distance:  4 finger widths  Mallampati score:  I - soft palate, uvula, fauces, pillars visible   Pre-sedation assessments completed and reviewed: airway patency, cardiovascular function, hydration status, mental status, nausea/vomiting, pain level, respiratory function and temperature   Immediate pre-procedure details:    Reassessment: Patient reassessed immediately prior  to procedure     Reviewed: vital signs, relevant labs/tests and NPO status     Verified: bag valve mask available, emergency equipment available, intubation equipment available, IV patency confirmed, oxygen available and suction available   Procedure details (see MAR for exact dosages):    Preoxygenation:  Nasal cannula   Sedation:  Propofol   Intended level of sedation: deep   Intra-procedure monitoring:  Blood pressure monitoring, cardiac monitor, continuous pulse oximetry, frequent LOC assessments, frequent vital sign checks and continuous capnometry   Intra-procedure events: none     Total Provider sedation time (minutes):  35 Post-procedure details:    Attendance: Constant attendance by certified staff until patient recovered     Recovery: Patient returned to pre-procedure baseline     Post-sedation assessments completed and reviewed: airway patency, cardiovascular function, hydration status, mental status, nausea/vomiting, pain level, respiratory function and temperature     Patient is stable for discharge or admission: yes     Patient tolerance:  Tolerated well, no immediate complications   (including critical care time)  Medications Ordered in ED Medications  diltiazem (CARDIZEM) 1 mg/mL load via infusion 20 mg (20 mg Intravenous Bolus from Bag 10/21/19 1804)    And  diltiazem (CARDIZEM) 125 mg in dextrose 5% 125 mL (1 mg/mL) infusion (5 mg/hr Intravenous New Bag/Given 10/21/19 1801)  propofol (DIPRIVAN) 10 mg/mL bolus/IV push 40 mg (40 mg Intravenous Given 10/21/19 1743)    ED Course  I have reviewed the triage vital signs and the nursing notes.  Pertinent labs & imaging results that were available during my care of the patient were reviewed by me and considered in my medical decision making (see chart for details).    MDM Rules/Calculators/A&P                          86 yo F with a cc of chest pain.  Going on for the past two days.  Cardiology recommended amio, though the  patient has a listed allergy and had VT.    I discussed this with the patient, elected for electrical cardioversion over amio.  Shocked x3 with recurrent afib.  Start on dilt.  No significant electrolyte abnormality.   Discussed with cards, will admit.   CRITICAL CARE Performed by: Cecilio Asper   Total critical care time: 35 minutes  Critical care time was exclusive of separately billable procedures and treating other patients.  Critical care was necessary to treat or prevent imminent or life-threatening deterioration.  Critical care was time spent personally by me on the following activities: development of treatment plan with patient and/or surrogate as well as nursing, discussions with consultants, evaluation of patient's response to treatment, examination of patient, obtaining history from patient or surrogate, ordering and performing treatments and interventions, ordering and review of laboratory studies, ordering and review of radiographic studies, pulse oximetry and re-evaluation of patient's condition.  The patients results and plan were reviewed and discussed.   Any x-rays performed were independently reviewed by myself.   Differential diagnosis were considered with the presenting HPI.  Medications  diltiazem (CARDIZEM) 1 mg/mL load via infusion 20 mg (20 mg Intravenous Bolus from Bag 10/21/19 1804)  And  diltiazem (CARDIZEM) 125 mg in dextrose 5% 125 mL (1 mg/mL) infusion (5 mg/hr Intravenous New Bag/Given 10/21/19 1801)  propofol (DIPRIVAN) 10 mg/mL bolus/IV push 40 mg (40 mg Intravenous Given 10/21/19 1743)    Vitals:   10/21/19 1746 10/21/19 1756 10/21/19 1800 10/21/19 1830  BP: 105/64 99/72 98/70    Pulse: (!) 127 (!) 135 (!) 124 (!) 127  Resp: (!) 31 (!) 28 18 (!) 21  Temp:  97.6 F (36.4 C)    TempSrc:  Oral    SpO2: 100% 100% 100% 100%  Weight:      Height:        Final diagnoses:  Atrial fibrillation with rapid ventricular response (HCC)     Admission/ observation were discussed with the admitting physician, patient and/or family and they are comfortable with the plan.   Final Clinical Impression(s) / ED Diagnoses Final diagnoses:  Atrial fibrillation with rapid ventricular response [facility])    Rx / DC Orders ED Discharge Orders    None       Deno Etienne, DO 10/21/19 1921

## 2019-10-21 NOTE — Sedation Documentation (Signed)
Shocked with 200J at this time.

## 2019-10-22 ENCOUNTER — Inpatient Hospital Stay (HOSPITAL_COMMUNITY): Payer: Medicare Other

## 2019-10-22 DIAGNOSIS — N184 Chronic kidney disease, stage 4 (severe): Secondary | ICD-10-CM | POA: Diagnosis present

## 2019-10-22 DIAGNOSIS — E1169 Type 2 diabetes mellitus with other specified complication: Secondary | ICD-10-CM | POA: Diagnosis present

## 2019-10-22 DIAGNOSIS — I4891 Unspecified atrial fibrillation: Secondary | ICD-10-CM | POA: Diagnosis present

## 2019-10-22 DIAGNOSIS — I130 Hypertensive heart and chronic kidney disease with heart failure and stage 1 through stage 4 chronic kidney disease, or unspecified chronic kidney disease: Secondary | ICD-10-CM | POA: Diagnosis present

## 2019-10-22 DIAGNOSIS — I080 Rheumatic disorders of both mitral and aortic valves: Secondary | ICD-10-CM | POA: Diagnosis present

## 2019-10-22 DIAGNOSIS — Z79899 Other long term (current) drug therapy: Secondary | ICD-10-CM | POA: Diagnosis not present

## 2019-10-22 DIAGNOSIS — C50412 Malignant neoplasm of upper-outer quadrant of left female breast: Secondary | ICD-10-CM | POA: Diagnosis present

## 2019-10-22 DIAGNOSIS — I252 Old myocardial infarction: Secondary | ICD-10-CM | POA: Diagnosis not present

## 2019-10-22 DIAGNOSIS — I5042 Chronic combined systolic (congestive) and diastolic (congestive) heart failure: Secondary | ICD-10-CM | POA: Diagnosis present

## 2019-10-22 DIAGNOSIS — Z20822 Contact with and (suspected) exposure to covid-19: Secondary | ICD-10-CM | POA: Diagnosis present

## 2019-10-22 DIAGNOSIS — I480 Paroxysmal atrial fibrillation: Secondary | ICD-10-CM | POA: Diagnosis present

## 2019-10-22 DIAGNOSIS — E785 Hyperlipidemia, unspecified: Secondary | ICD-10-CM | POA: Diagnosis present

## 2019-10-22 DIAGNOSIS — Z888 Allergy status to other drugs, medicaments and biological substances status: Secondary | ICD-10-CM | POA: Diagnosis not present

## 2019-10-22 DIAGNOSIS — Z87891 Personal history of nicotine dependence: Secondary | ICD-10-CM | POA: Diagnosis not present

## 2019-10-22 DIAGNOSIS — I2510 Atherosclerotic heart disease of native coronary artery without angina pectoris: Secondary | ICD-10-CM | POA: Diagnosis present

## 2019-10-22 DIAGNOSIS — Z91048 Other nonmedicinal substance allergy status: Secondary | ICD-10-CM | POA: Diagnosis not present

## 2019-10-22 DIAGNOSIS — Z7901 Long term (current) use of anticoagulants: Secondary | ICD-10-CM | POA: Diagnosis not present

## 2019-10-22 DIAGNOSIS — E1122 Type 2 diabetes mellitus with diabetic chronic kidney disease: Secondary | ICD-10-CM | POA: Diagnosis present

## 2019-10-22 DIAGNOSIS — I4819 Other persistent atrial fibrillation: Secondary | ICD-10-CM | POA: Diagnosis not present

## 2019-10-22 DIAGNOSIS — Z95810 Presence of automatic (implantable) cardiac defibrillator: Secondary | ICD-10-CM | POA: Diagnosis not present

## 2019-10-22 DIAGNOSIS — E039 Hypothyroidism, unspecified: Secondary | ICD-10-CM | POA: Diagnosis present

## 2019-10-22 DIAGNOSIS — Z79890 Hormone replacement therapy (postmenopausal): Secondary | ICD-10-CM | POA: Diagnosis not present

## 2019-10-22 DIAGNOSIS — I340 Nonrheumatic mitral (valve) insufficiency: Secondary | ICD-10-CM

## 2019-10-22 DIAGNOSIS — Z8249 Family history of ischemic heart disease and other diseases of the circulatory system: Secondary | ICD-10-CM | POA: Diagnosis not present

## 2019-10-22 DIAGNOSIS — Z170 Estrogen receptor positive status [ER+]: Secondary | ICD-10-CM | POA: Diagnosis not present

## 2019-10-22 DIAGNOSIS — D631 Anemia in chronic kidney disease: Secondary | ICD-10-CM | POA: Diagnosis present

## 2019-10-22 DIAGNOSIS — Z7289 Other problems related to lifestyle: Secondary | ICD-10-CM | POA: Diagnosis not present

## 2019-10-22 HISTORY — DX: Unspecified atrial fibrillation: I48.91

## 2019-10-22 LAB — CBC
HCT: 32.1 % — ABNORMAL LOW (ref 36.0–46.0)
Hemoglobin: 9.9 g/dL — ABNORMAL LOW (ref 12.0–15.0)
MCH: 28.9 pg (ref 26.0–34.0)
MCHC: 30.8 g/dL (ref 30.0–36.0)
MCV: 93.6 fL (ref 80.0–100.0)
Platelets: 227 10*3/uL (ref 150–400)
RBC: 3.43 MIL/uL — ABNORMAL LOW (ref 3.87–5.11)
RDW: 13 % (ref 11.5–15.5)
WBC: 11.6 10*3/uL — ABNORMAL HIGH (ref 4.0–10.5)
nRBC: 0 % (ref 0.0–0.2)

## 2019-10-22 LAB — BASIC METABOLIC PANEL
Anion gap: 12 (ref 5–15)
BUN: 64 mg/dL — ABNORMAL HIGH (ref 8–23)
CO2: 23 mmol/L (ref 22–32)
Calcium: 9 mg/dL (ref 8.9–10.3)
Chloride: 102 mmol/L (ref 98–111)
Creatinine, Ser: 2.37 mg/dL — ABNORMAL HIGH (ref 0.44–1.00)
GFR calc Af Amer: 21 mL/min — ABNORMAL LOW (ref >60)
GFR calc non Af Amer: 18 mL/min — ABNORMAL LOW (ref >60)
Glucose, Bld: 168 mg/dL — ABNORMAL HIGH (ref 70–99)
Potassium: 4.5 mmol/L (ref 3.5–5.1)
Sodium: 137 mmol/L (ref 135–145)

## 2019-10-22 LAB — ECHOCARDIOGRAM LIMITED
Height: 64 in
MV M vel: 5.63 m/s
MV Peak grad: 126.6 mmHg
Radius: 0.6 cm
Weight: 2624 oz

## 2019-10-22 LAB — PROTIME-INR
INR: 1.4 — ABNORMAL HIGH (ref 0.8–1.2)
Prothrombin Time: 17.1 seconds — ABNORMAL HIGH (ref 11.4–15.2)

## 2019-10-22 MED ORDER — CARVEDILOL 3.125 MG PO TABS
3.1250 mg | ORAL_TABLET | Freq: Two times a day (BID) | ORAL | Status: DC
  Administered 2019-10-22 – 2019-10-23 (×3): 3.125 mg via ORAL
  Filled 2019-10-22 (×3): qty 1

## 2019-10-22 MED ORDER — ATORVASTATIN CALCIUM 40 MG PO TABS
40.0000 mg | ORAL_TABLET | Freq: Every day | ORAL | Status: DC
  Administered 2019-10-22 – 2019-10-23 (×2): 40 mg via ORAL
  Filled 2019-10-22 (×2): qty 1

## 2019-10-22 MED ORDER — ANASTROZOLE 1 MG PO TABS
1.0000 mg | ORAL_TABLET | Freq: Every day | ORAL | Status: DC
  Administered 2019-10-22 – 2019-10-24 (×3): 1 mg via ORAL
  Filled 2019-10-22 (×3): qty 1

## 2019-10-22 MED ORDER — APIXABAN 2.5 MG PO TABS
2.5000 mg | ORAL_TABLET | Freq: Two times a day (BID) | ORAL | Status: DC
  Administered 2019-10-22 – 2019-10-24 (×6): 2.5 mg via ORAL
  Filled 2019-10-22 (×6): qty 1

## 2019-10-22 MED ORDER — ONDANSETRON HCL 4 MG/2ML IJ SOLN: 4.0000 mg | Freq: Four times a day (QID) | INTRAMUSCULAR | Status: DC | PRN

## 2019-10-22 MED ORDER — ACETAMINOPHEN 325 MG PO TABS: 650.0000 mg | ORAL_TABLET | ORAL | Status: DC | PRN

## 2019-10-22 MED ORDER — BIOTIN 1 MG PO CAPS: 1.0000 mg | ORAL_CAPSULE | Freq: Every day | ORAL | Status: DC

## 2019-10-22 MED ORDER — LEVOTHYROXINE SODIUM 75 MCG PO TABS
150.0000 ug | ORAL_TABLET | Freq: Every day | ORAL | Status: DC
  Administered 2019-10-22 – 2019-10-24 (×3): 150 ug via ORAL
  Filled 2019-10-22 (×3): qty 2

## 2019-10-22 NOTE — Progress Notes (Signed)
Nutrition Brief Note  RD working remotely.  Patient identified on the Malnutrition Screening Tool (MST) Report.  Per screening, pt reports ~20 lb weight loss over a year ago, 0 value score for poor appetite and per ED notes, pt reports eating and drinking normally.  Wt Readings from Last 15 Encounters:  10/22/19 74.4 kg  10/21/19 74.4 kg  10/20/19 73.8 kg  09/23/19 73.8 kg  09/19/19 73.5 kg  08/11/19 74 kg  08/03/19 72.6 kg  07/06/19 73 kg  06/29/19 75.5 kg  06/24/19 79 kg  06/18/19 76.3 kg  06/07/19 75.5 kg  05/31/19 75.1 kg  05/17/19 79.6 kg  04/07/19 78.9 kg    Body mass index is 28.15 kg/m. Patient meets criteria for overweight based on current BMI.   Current diet order is HH, patient is consuming approximately 100% of meals at this time. Labs and medications reviewed.   No nutrition interventions warranted at this time. If nutrition issues arise, please consult RD.   Lajuan Lines, RD, LDN Clinical Nutrition After Hours/Weekend Pager # in [place]

## 2019-10-22 NOTE — Progress Notes (Signed)
Patient in paced rhythm, EKG completed to confirm.

## 2019-10-22 NOTE — Progress Notes (Signed)
Progress Note  Patient Name: Lauren Walker Date of Encounter: 10/22/2019  Primary Cardiologist: Jenne Campus, MD   Subjective   Feels back to normal now in paced rhythm.  Inpatient Medications    Scheduled Meds: . anastrozole  1 mg Oral Daily  . apixaban  2.5 mg Oral BID  . atorvastatin  40 mg Oral q1800  . levothyroxine  150 mcg Oral QAC breakfast   Continuous Infusions: . diltiazem (CARDIZEM) infusion 5 mg/hr (10/22/19 0700)   PRN Meds: acetaminophen, ondansetron (ZOFRAN) IV   Vital Signs    Vitals:   10/21/19 2326 10/22/19 0426 10/22/19 0451 10/22/19 0948  BP: (!) 122/53  (!) 116/52 (!) 116/47  Pulse: 95  79 63  Resp:   (!) 24 18  Temp:   99.7 F (37.6 C) 99.2 F (37.3 C)  TempSrc:   Oral Oral  SpO2:   98% 97%  Weight:  74.4 kg    Height:        Intake/Output Summary (Last 24 hours) at 10/22/2019 0957 Last data filed at 10/22/2019 1779 Gross per 24 hour  Intake 382.89 ml  Output 270 ml  Net 112.89 ml   Filed Weights   10/21/19 1703 10/21/19 2254 10/22/19 0426  Weight: 74.4 kg 75.1 kg 74.4 kg    Telemetry    Afib RVR then converted to paced rhythm  - Personally Reviewed  ECG    Paced with frequent ectopy- Personally Reviewed  Physical Exam   GEN: No acute distress.   Neck: No JVD Cardiac: regular rhythm, normal rate, no murmurs, rubs, or gallops.  Respiratory: Clear to auscultation bilaterally. GI: Soft, nontender, non-distended  MS: No edema; No deformity. Neuro:  Nonfocal  Psych: Normal affect   Labs    Chemistry Recent Labs  Lab 10/20/19 1544 10/21/19 1719  NA 140 131*  K 4.0 4.0  CL 106 98  CO2 23 23  GLUCOSE 169* 172*  BUN 64* 68*  CREATININE 1.94* 2.26*  CALCIUM 8.4* 9.0  PROT 5.6*  --   ALBUMIN 2.9*  --   AST 21  --   ALT 16  --   ALKPHOS 40  --   BILITOT 0.6  --   GFRNONAA 23* 19*  GFRAA 26* 22*  ANIONGAP 11 10     Hematology Recent Labs  Lab 10/20/19 1544 10/21/19 1719  WBC 9.8 12.6*  RBC 3.25* 3.54*   HGB 9.4* 10.6*  HCT 31.1* 32.9*  MCV 95.7 92.9  MCH 28.9 29.9  MCHC 30.2 32.2  RDW 12.6 12.9  PLT 169 226    Cardiac EnzymesNo results for input(s): TROPONINI in the last 168 hours. No results for input(s): TROPIPOC in the last 168 hours.   BNP Recent Labs  Lab 10/20/19 1544  BNP 516.6*     DDimer No results for input(s): DDIMER in the last 168 hours.   Radiology    DG Chest 2 View  Result Date: 10/20/2019 CLINICAL DATA:  Chest pain EXAM: CHEST - 2 VIEW COMPARISON:  06/24/2019 FINDINGS: Tiny left pleural effusion is present, decreased in size since prior examination. Lungs are well expanded and are otherwise clear. No pneumothorax. Right subclavian pacemaker defibrillator is unchanged. Cardiac size is mildly enlarged, unchanged. Pulmonary vascularity is normal. No acute bone abnormality. IMPRESSION: 1. Tiny left pleural effusion, decreased in size since prior examination. 2. Stable mild cardiomegaly. Electronically Signed   By: Fidela Salisbury MD   On: 10/20/2019 15:45    Cardiac Studies  Patient Profile    Assessment & Plan   Principal Problem:   Atrial fibrillation (HCC) Active Problems:   Atrial fibrillation with RVR (HCC)   Atrial fibrillation with rapid ventricular response (HCC)   No amiodarone due to prior VT felt related to amiodarone and QT prolongation.   Will transition back to carvedilol 3.125 mg BID and stop dilt drip. Will need follow up in afib clinic or EP upon discharge.   MR felt to be secondary MR, for medical management. However, now with recurrent afib, would recommend sooner structural consult for consideration of mitraclip as possible nidus for recurrent afib. She is euvolemic.  Will repeat an echo to better assess nature of MR currently, last echo in April.   Possible DC tomorrow.   Total time of encounter: 35 minutes total time of encounter, including 20 minutes spent in face-to-face patient care. This time includes coordination of care  and counseling regarding management of afib and MR. Remainder of non-face-to-face time involved reviewing chart documents/testing relevant to the patient encounter and documentation in the medical record.  Cherlynn Kaiser, MD [place]    For questions or updates, please contact [facility] Please consult [email or web address] for contact info under        Signed, [name], MD  10/22/2019, 9:57 AM

## 2019-10-22 NOTE — Progress Notes (Signed)
*  PRELIMINARY RESULTS* Echocardiogram Limited 2-D Echocardiogram has been performed to evaluate mitral valve insufficiency.  Samuel [place] 10/22/2019, 4:40 PM

## 2019-10-23 DIAGNOSIS — I4819 Other persistent atrial fibrillation: Secondary | ICD-10-CM

## 2019-10-23 LAB — CBC
HCT: 32.2 % — ABNORMAL LOW (ref 36.0–46.0)
Hemoglobin: 10 g/dL — ABNORMAL LOW (ref 12.0–15.0)
MCH: 29 pg (ref 26.0–34.0)
MCHC: 31.1 g/dL (ref 30.0–36.0)
MCV: 93.3 fL (ref 80.0–100.0)
Platelets: 238 10*3/uL (ref 150–400)
RBC: 3.45 MIL/uL — ABNORMAL LOW (ref 3.87–5.11)
RDW: 12.6 % (ref 11.5–15.5)
WBC: 9.5 10*3/uL (ref 4.0–10.5)
nRBC: 0 % (ref 0.0–0.2)

## 2019-10-23 LAB — BASIC METABOLIC PANEL
Anion gap: 9 (ref 5–15)
BUN: 67 mg/dL — ABNORMAL HIGH (ref 8–23)
CO2: 24 mmol/L (ref 22–32)
Calcium: 8.8 mg/dL — ABNORMAL LOW (ref 8.9–10.3)
Chloride: 103 mmol/L (ref 98–111)
Creatinine, Ser: 2.24 mg/dL — ABNORMAL HIGH (ref 0.44–1.00)
GFR calc Af Amer: 22 mL/min — ABNORMAL LOW (ref >60)
GFR calc non Af Amer: 19 mL/min — ABNORMAL LOW (ref >60)
Glucose, Bld: 249 mg/dL — ABNORMAL HIGH (ref 70–99)
Potassium: 4.7 mmol/L (ref 3.5–5.1)
Sodium: 136 mmol/L (ref 135–145)

## 2019-10-23 MED ORDER — METOPROLOL TARTRATE 25 MG PO TABS
25.0000 mg | ORAL_TABLET | Freq: Three times a day (TID) | ORAL | Status: DC
  Administered 2019-10-23 – 2019-10-24 (×4): 25 mg via ORAL
  Filled 2019-10-23 (×4): qty 1

## 2019-10-23 MED ORDER — DILTIAZEM HCL-DEXTROSE 125-5 MG/125ML-% IV SOLN (PREMIX)
5.0000 mg/h | INTRAVENOUS | Status: DC
  Administered 2019-10-23: 5 mg/h via INTRAVENOUS
  Filled 2019-10-23: qty 125

## 2019-10-23 NOTE — Progress Notes (Signed)
Patient in atrial fibrillation with rapid ventricular response. Heart rate 100's-130's.  Dr. Radford Pax, on call with  Cardiology updated and stated to give morning dose of Coreg.

## 2019-10-23 NOTE — Plan of Care (Signed)
  Problem: Health Behavior/Discharge Planning: Goal: Ability to manage health-related needs will improve Outcome: Progressing   Problem: Education: Goal: Knowledge of General Education information will improve Description: Including pain rating scale, medication(s)/side effects and non-pharmacologic comfort measures Outcome: Progressing   

## 2019-10-23 NOTE — Progress Notes (Signed)
Progress Note  Patient Name: Lauren Walker Date of Encounter: 10/23/2019  Primary Cardiologist: Jenne Campus, MD   Subjective   Back in afib. We discussed contributing factors.  Inpatient Medications    Scheduled Meds: . anastrozole  1 mg Oral Daily  . apixaban  2.5 mg Oral BID  . atorvastatin  40 mg Oral q1800  . levothyroxine  150 mcg Oral QAC breakfast  . metoprolol tartrate  25 mg Oral TID   Continuous Infusions:  PRN Meds: acetaminophen, ondansetron (ZOFRAN) IV   Vital Signs    Vitals:   10/23/19 0002 10/23/19 0528 10/23/19 0537 10/23/19 0757  BP: (!) 127/54 115/68  101/68  Pulse: 99 100  (!) 126  Resp: 18 (!) 23  (!) 22  Temp: 98 F (36.7 C) 98.6 F (37 C)  98.4 F (36.9 C)  TempSrc: Oral Oral  Oral  SpO2: 95% 98%  97%  Weight:   75.1 kg   Height:        Intake/Output Summary (Last 24 hours) at 10/23/2019 0948 Last data filed at 10/23/2019 0530 Gross per 24 hour  Intake 700 ml  Output 875 ml  Net -175 ml   Filed Weights   10/21/19 2254 10/22/19 0426 10/23/19 0537  Weight: 75.1 kg 74.4 kg 75.1 kg    Telemetry    Afib RVR then converted to paced rhythm  - Personally Reviewed  ECG    Paced with frequent ectopy- Personally Reviewed  Physical Exam   GEN: No acute distress.   Neck: No JVD Cardiac: regular rhythm, normal rate, no murmurs, rubs, or gallops.  Respiratory: Clear to auscultation bilaterally. GI: Soft, nontender, non-distended  MS: No edema; No deformity. Neuro:  Nonfocal  Psych: Normal affect   Labs    Chemistry Recent Labs  Lab 10/20/19 1544 10/20/19 1544 10/21/19 1719 10/22/19 0901 10/23/19 0900  NA 140   < > 131* 137 136  K 4.0   < > 4.0 4.5 4.7  CL 106   < > 98 102 103  CO2 23   < > 23 23 24   GLUCOSE 169*   < > 172* 168* 249*  BUN 64*   < > 68* 64* 67*  CREATININE 1.94*   < > 2.26* 2.37* 2.24*  CALCIUM 8.4*   < > 9.0 9.0 8.8*  PROT 5.6*  --   --   --   --   ALBUMIN 2.9*  --   --   --   --   AST 21  --   --    --   --   ALT 16  --   --   --   --   ALKPHOS 40  --   --   --   --   BILITOT 0.6  --   --   --   --   GFRNONAA 23*   < > 19* 18* 19*  GFRAA 26*   < > 22* 21* 22*  ANIONGAP 11   < > 10 12 9    < > = values in this interval not displayed.     Hematology Recent Labs  Lab 10/21/19 1719 10/22/19 0901 10/23/19 0900  WBC 12.6* 11.6* 9.5  RBC 3.54* 3.43* 3.45*  HGB 10.6* 9.9* 10.0*  HCT 32.9* 32.1* 32.2*  MCV 92.9 93.6 93.3  MCH 29.9 28.9 29.0  MCHC 32.2 30.8 31.1  RDW 12.9 13.0 12.6  PLT 226 227 238    Cardiac EnzymesNo results for  input(s): TROPONINI in the last 168 hours. No results for input(s): TROPIPOC in the last 168 hours.   BNP Recent Labs  Lab 10/20/19 1544  BNP 516.6*     DDimer No results for input(s): DDIMER in the last 168 hours.   Radiology    ECHOCARDIOGRAM LIMITED  Result Date: 10/22/2019    ECHOCARDIOGRAM LIMITED REPORT   Patient Name:   Lauren Walker Date of Exam: 10/22/2019 Medical Rec #:  [id number]      Height:       64.0 in Accession #:    [id number]     Weight:       164.0 lb Date of Birth:  [birth year]      BSA:          1.798 m Patient Age:    86 years       BP:           119/44 mmHg Patient Gender: F              HR:           64 bpm. Exam Location:  Inpatient Procedure: Limited Echo, Cardiac Doppler and Limited Color Doppler                              MODIFIED REPORT: This report was modified by Cherlynn Kaiser MD on 10/22/2019 due to revision.  Indications:     Mitral valve insufficiency 424.0 / 134.0  History:         Patient has prior history of Echocardiogram examinations, most                  recent 05/25/2019. CHF, CAD and Previous Myocardial Infarction,                  Pacemaker and Defibrillator, Mitral Valve Disease,                  Arrythmias:Atrial Fibrillation; Risk Factors:Diabetes and                  Hypertension. H/o mitral valve insufficiency.  Sonographer:     Alvino Chapel RCS Referring Phys:  [id number] [name] Diagnosing Phys:  Cherlynn Kaiser MD IMPRESSIONS  1. Severe mitral valve regurgitation. Moderate restriction of posterior mitral leaflet, with posteriorly directed eccentric jet. Secondary mitral regurgitation, likely Carpentier type IIIB. Quantitation by PISA suggests moderate regurgitation however there is splaying of color flow Doppler and the dominant jet demonstrates Coanda effect. These finding in combination suggest severe mitral valve regurgitation. The mitral valve is grossly normal. Severe mitral valve regurgitation.  2. Left ventricular ejection fraction, by estimation, is 45 to 50%. The left ventricle has mildly decreased function. The left ventricle demonstrates regional wall motion abnormalities (see scoring diagram/findings for description).  3. Right ventricular systolic function is moderately reduced.  4. Left atrial size was severely dilated.  5. Aortic valve regurgitation is trivial.  6. The inferior vena cava is dilated in size with <50% respiratory variability, suggesting right atrial pressure of 15 mmHg. Comparison(s): A prior study was performed on 05/25/19. No significant change from prior study. Prior images reviewed side by side. FINDINGS  Left Ventricle: Left ventricular ejection fraction, by estimation, is 45 to 50%. The left ventricle has mildly decreased function. The left ventricle demonstrates regional wall motion abnormalities.  LV Wall Scoring: The basal inferior segment is akinetic. The mid and distal inferior wall is hypokinetic.  Right Ventricle: Right ventricular systolic function is moderately reduced. Left Atrium: Left atrial size was severely dilated. Pericardium: A small pericardial effusion is present. Mitral Valve: Severe mitral valve regurgitation. Moderate restriction of posterior mitral leaflet, with posteriorly directed eccentric jet. Secondary mitral regurgitation, likely Carpentier type IIIB. Quantitation by PISA suggests moderate regurgitation however there is splaying of color flow  Doppler and the dominant jet demonstrates Coanda effect. These finding in combination suggest severe mitral valve regurgitation. The mitral valve is grossly normal. Severe mitral valve regurgitation. Aortic Valve: Aortic valve regurgitation is trivial. There is moderate calcification of the aortic valve. Venous: A systolic blunting flow pattern is recorded from the right lower pulmonary vein. The inferior vena cava is dilated in size with less than 50% respiratory variability, suggesting right atrial pressure of 15 mmHg. LEFT VENTRICLE PLAX 2D LVOT diam:     1.70 cm LVOT Area:     2.27 cm  LEFT ATRIUM              Index LA Vol (A2C):   116.0 ml 64.51 ml/m LA Vol (A4C):   106.0 ml 58.95 ml/m LA Biplane Vol: 113.0 ml 62.84 ml/m  MR Peak grad:    126.6 mmHg MR Mean grad:    87.0 mmHg   SHUNTS MR Vmax:         562.50 cm/s Systemic Diam: 1.70 cm MR Vmean:        443.5 cm/s MR PISA:         2.26 cm MR PISA Eff ROA: 13 mm MR PISA Radius:  0.60 cm Cherlynn Kaiser MD Electronically signed by Cherlynn Kaiser MD Signature Date/Time: 10/22/2019/6:16:48 PM    Final (Updated)     Cardiac Studies    Patient Profile    Assessment & Plan   Principal Problem:   Atrial fibrillation ([place]) Active Problems:   Atrial fibrillation with RVR (HCC)   Atrial fibrillation with rapid ventricular response (HCC)   No amiodarone due to prior VT felt related to amiodarone and QT prolongation.   Will transition to metoprolol 25 mg TID for better rate control. If we can optimize rates we can consider discharge. I am suspicious that her afib is exacerbated by severe MR and we discussed possible Structural consult for Mitraclip consideration.   She may also need an evaluation with EP given inability to use AAD due to prior arrest with QT prolongation, renal dysfunction, and evidence of structural HD.  Discussed in detail with patient.   Total time of encounter: 30 minutes total time of encounter, including 20 minutes spent  in face-to-face patient care. This time includes coordination of care and counseling regarding management of afib and MR. Remainder of non-face-to-face time involved reviewing chart documents/testing relevant to the patient encounter and documentation in the medical record.  Cherlynn Kaiser, MD [place]    For questions or updates, please contact [facility] Please consult [email or web address] for contact info under        Signed, [name], MD  10/23/2019, 9:48 AM

## 2019-10-23 NOTE — Progress Notes (Signed)
   10/23/19 0757  Assess: MEWS Score  Temp 98.4 F (36.9 C)  BP 101/68  Pulse Rate (!) 126  ECG Heart Rate (!) 126  Resp (!) 22  SpO2 97 %  O2 Device Room Air  Assess: MEWS Score  MEWS Temp 0  MEWS Systolic 0  MEWS Pulse 2  MEWS RR 1  MEWS LOC 0  MEWS Score 3  MEWS Score Color Yellow  Assess: if the MEWS score is Yellow or Red  Were vital signs taken at a resting state? Yes  Focused Assessment Change from prior assessment (see assessment flowsheet)  Early Detection of Sepsis Score *See Row Information* Low  MEWS guidelines implemented *See Row Information* Yes  Take Vital Signs  Increase Vital Sign Frequency  Yellow: Q 2hr X 2 then Q 4hr X 2, if remains yellow, continue Q 4hrs  Escalate  MEWS: Escalate Yellow: discuss with charge nurse/RN and consider discussing with provider and RRT  Notify: Charge Nurse/RN  Name of Charge Nurse/RN Notified  Kerrie Buffalo RN)  Date Charge Nurse/RN Notified 10/23/19  Time Charge Nurse/RN Notified 0122  Notify: Provider  Provider Name/Title Rosaria Ferries PA  Date Provider Notified 10/23/19  Time Provider Notified [phone]  Notification Type Page  Notification Reason Other (Comment) (MEWS color change )  Response Other (Comment) (MD to see pt)  Date of Provider Response 10/23/19  Time of Provider Response [phone]  Document  Patient Outcome Other (Comment) (Medication change by MD)  Progress note created (see row info) Yes

## 2019-10-24 ENCOUNTER — Other Ambulatory Visit: Payer: Self-pay | Admitting: Physician Assistant

## 2019-10-24 DIAGNOSIS — I340 Nonrheumatic mitral (valve) insufficiency: Secondary | ICD-10-CM

## 2019-10-24 MED ORDER — DILTIAZEM HCL ER COATED BEADS 120 MG PO CP24
120.0000 mg | ORAL_CAPSULE | Freq: Every day | ORAL | Status: DC
  Administered 2019-10-24: 120 mg via ORAL
  Filled 2019-10-24: qty 1

## 2019-10-24 MED ORDER — METOPROLOL TARTRATE 25 MG PO TABS: 25.0000 mg | ORAL_TABLET | Freq: Two times a day (BID) | ORAL | Status: DC

## 2019-10-24 MED ORDER — DILTIAZEM HCL ER COATED BEADS 120 MG PO CP24: 120.0000 mg | ORAL_CAPSULE | Freq: Every day | ORAL | 6 refills | Status: DC

## 2019-10-24 MED ORDER — METOPROLOL TARTRATE 25 MG PO TABS: 25.0000 mg | ORAL_TABLET | Freq: Two times a day (BID) | ORAL | 6 refills | Status: DC

## 2019-10-24 NOTE — Discharge Instructions (Addendum)
Atrial Fibrillation  Atrial fibrillation is a type of heartbeat that is irregular or fast. If you have this condition, your heart beats without any order. This makes it hard for your heart to pump blood in a normal way. Atrial fibrillation may come and go, or it may become a long-lasting problem. If this condition is not treated, it can put you at higher risk for stroke, heart failure, and other heart problems. What are the causes? This condition may be caused by diseases that damage the heart. They include:  High blood pressure.  Heart failure.  Heart valve disease.  Heart surgery. Other causes include:  Diabetes.  Thyroid disease.  Being overweight.  Kidney disease. Sometimes the cause is not known. What increases the risk? You are more likely to develop this condition if:  You are older.  You smoke.  You exercise often and very hard.  You have a family history of this condition.  You are a man.  You use drugs.  You drink a lot of alcohol.  You have lung conditions, such as emphysema, pneumonia, or COPD.  You have sleep apnea. What are the signs or symptoms? Common symptoms of this condition include:  A feeling that your heart is beating very fast.  Chest pain or discomfort.  Feeling short of breath.  Suddenly feeling light-headed or weak.  Getting tired easily during activity.  Fainting.  Sweating. In some cases, there are no symptoms. How is this treated? Treatment for this condition depends on underlying conditions and how you feel when you have atrial fibrillation. They include:  Medicines to: ? Prevent blood clots. ? Treat heart rate or heart rhythm problems.  Using devices, such as a pacemaker, to correct heart rhythm problems.  Doing surgery to remove the part of the heart that sends bad signals.  Closing an area where clots can form in the heart (left atrial appendage). In some cases, your doctor will treat other underlying  conditions. Follow these instructions at home: Medicines  Take over-the-counter and prescription medicines only as told by your doctor.  Do not take any new medicines without first talking to your doctor.  If you are taking blood thinners: ? Talk with your doctor before you take any medicines that have aspirin or NSAIDs, such as ibuprofen, in them. ? Take your medicine exactly as told by your doctor. Take it at the same time each day. ? Avoid activities that could hurt or bruise you. Follow instructions about how to prevent falls. ? Wear a bracelet that says you are taking blood thinners. Or, carry a card that lists what medicines you take. Lifestyle      Do not use any products that have nicotine or tobacco in them. These include cigarettes, e-cigarettes, and chewing tobacco. If you need help quitting, ask your doctor.  Eat heart-healthy foods. Talk with your doctor about the right eating plan for you.  Exercise regularly as told by your doctor.  Do not drink alcohol.  Lose weight if you are overweight.  Do not use drugs, including cannabis. General instructions  If you have a condition that causes breathing to stop for a short period of time (apnea), treat it as told by your doctor.  Keep a healthy weight. Do not use diet pills unless your doctor says they are safe for you. Diet pills may make heart problems worse.  Keep all follow-up visits as told by your doctor. This is important. Contact a doctor if:  You notice a change  in the speed, rhythm, or strength of your heartbeat.  You are taking a blood-thinning medicine and you get more bruising.  You get tired more easily when you move or exercise.  You have a sudden change in weight. Get help right away if:   You have pain in your chest or your belly (abdomen).  You have trouble breathing.  You have side effects of blood thinners, such as blood in your vomit, poop (stool), or pee (urine), or bleeding that cannot  stop.  You have any signs of a stroke. "BE FAST" is an easy way to remember the main warning signs: ? B - Balance. Signs are dizziness, sudden trouble walking, or loss of balance. ? E - Eyes. Signs are trouble seeing or a change in how you see. ? F - Face. Signs are sudden weakness or loss of feeling in the face, or the face or eyelid drooping on one side. ? A - Arms. Signs are weakness or loss of feeling in an arm. This happens suddenly and usually on one side of the body. ? S - Speech. Signs are sudden trouble speaking, slurred speech, or trouble understanding what people say. ? T - Time. Time to call emergency services. Write down what time symptoms started.  You have other signs of a stroke, such as: ? A sudden, very bad headache with no known cause. ? Feeling like you may vomit (nausea). ? Vomiting. ? A seizure. These symptoms may be an emergency. Do not wait to see if the symptoms will go away. Get medical help right away. Call your local emergency services (911 in the [place] Do not drive yourself to the hospital. Summary  Atrial fibrillation is a type of heartbeat that is irregular or fast.  You are at higher risk of this condition if you smoke, are older, have diabetes, or are overweight.  Follow your doctor's instructions about medicines, diet, exercise, and follow-up visits.  Get help right away if you have signs or symptoms of a stroke.  Get help right away if you cannot catch your breath, or you have chest pain or discomfort. This information is not intended to replace advice given to you by your health care provider. Make sure you discuss any questions you have with your health care provider. Document Revised: 07/28/2018 Document Reviewed: 07/28/2018 Elsevier Patient Education  [place].  Diltiazem Oral Tablets What is this medicine? DILTIAZEM (dil TYE a zem) is a calcium channel blocker. It relaxes your blood vessels and decreases the amount of work the heart  has to do. It treats and/or prevents chest pain (also called angina). This medicine may be used for other purposes; ask your health care provider or pharmacist if you have questions. COMMON BRAND NAME(S): Cardizem What should I tell my health care provider before I take this medicine? They need to know if you have any of these conditions:  heart attack  heart disease  irregular heartbeat or rhythm  low blood pressure  an unusual or allergic reaction to diltiazem, other drugs, foods, dyes, or preservatives  pregnant or trying to get pregnant  breast-feeding How should I use this medicine? Take this drug by mouth. Take it as directed on the prescription label at the same time every day. Keep taking it unless your health care provider tells you to stop. Talk to your health care provider about the use of this drug in children. Special care may be needed. Overdosage: If you think you have taken too much  of this medicine contact a poison control center or emergency room at once. NOTE: This medicine is only for you. Do not share this medicine with others. What if I miss a dose? If you miss a dose, take it as soon as you can. If it is almost time for your next dose, take only that dose. Do not take double or extra doses. What may interact with this medicine? Do not take this medicine with any of the following:  cisapride  hawthorn  pimozide  ranolazine  red yeast rice This medicine may also interact with the following medications:  buspirone  carbamazepine  cimetidine  cyclosporine  digoxin  local anesthetics or general anesthetics  lovastatin  medicines for anxiety or difficulty sleeping like midazolam and triazolam  medicines for high blood pressure or heart problems  quinidine  rifampin, rifabutin, or rifapentine This list may not describe all possible interactions. Give your health care provider a list of all the medicines, herbs, non-prescription drugs, or  dietary supplements you use. Also tell them if you smoke, drink alcohol, or use illegal drugs. Some items may interact with your medicine. What should I watch for while using this medicine? Visit your health care provider for regular checks on your progress. Check your blood pressure as directed. Ask your health care provider what your blood pressure should be. Also, find out when you should contact him or her. Do not treat yourself for coughs, colds, or pain while you are using this drug without asking your health care provider for advice. Some drugs may increase your blood pressure. This drug may cause serious skin reactions. They can happen weeks to months after starting the drug. Contact your health care provider right away if you notice fevers or flu-like symptoms with a rash. The rash may be red or purple and then turn into blisters or peeling of the skin. Or, you might notice a red rash with swelling of the face, lips or lymph nodes in your neck or under your arms. You may get drowsy or dizzy. Do not drive, use machinery, or do anything that needs mental alertness until you know how this drug affects you. Do not stand up or sit up quickly, especially if you are an older patient. This reduces the risk of dizzy or fainting spells. What side effects may I notice from receiving this medicine? Side effects that you should report to your doctor or health care provider as soon as possible:  allergic reactions (skin rash, itching or hives; swelling of the face, lips, or tongue)  heart failure (trouble breathing; fast, irregular heartbeat; sudden weight gain; swelling of the ankles, feet, hands; unusually weak or tired)  heartbeat rhythm changes (trouble breathing; chest pain; dizziness; fast, irregular heartbeat; feeling faint or lightheaded, falls)  liver injury (dark yellow or brown urine; general ill feeling or flu-like symptoms; loss of appetite, right upper belly pain; unusually weak or tired,  yellowing of the eyes or skin)  low blood pressure (dizziness; feeling faint or lightheaded, falls; unusually weak or tired)  redness, blistering, peeling, or loosening of the skin, including inside the mouth Side effects that usually do not require medical attention (report to your doctor or health care provider if they continue or are bothersome):  changes in sex drive or performance  depressed mood  headache  sudden weight gain  nausea  sudden weight gain  trouble sleeping This list may not describe all possible side effects. Call your doctor for medical advice about side effects.  You may report side effects to FDA at 1-800-FDA-1088. Where should I keep my medicine? Keep out of the reach of children and pets. Store at room temperature between 15 and 30 degrees C (59 and 86 degrees F). Protect from moisture. Keep the container tightly closed. Throw away any unused drug after the expiration date. NOTE: This sheet is a summary. It may not cover all possible information. If you have questions about this medicine, talk to your doctor, pharmacist, or health care provider.  2020 Elsevier/Gold Standard (2018-11-09 18:13:24)

## 2019-10-24 NOTE — Consult Note (Signed)
[facility]   MULTIDISCIPLINARY HEART VALVE TEAM  Date:  10/24/2019   ID:  Lauren Walker, DOB 1929/04/10, MRN [id number]  PCP:  Midge Minium, MD   Chief Complaint  Patient presents with  . Atrial Fib/RVR     HISTORY OF PRESENT ILLNESS: Lauren Walker is a 86 y.o. female seen at the request of Dr. Margaretann Loveless for evaluation of severe mitral regurgitation.  At the time of my evaluation today, the patient's husband is at the bedside.  They moved to [place] about 1 year ago from [place].  They moved in order to gain proximity to their daughter, granddaughter, and other family members.  They have lived independently, the patient retired from teaching and her husband a retired Engineer, maintenance (IT).  The patient's cardiac history dates back to approximately 2016 when she was diagnosed with coronary artery disease when she presented late into the course of an inferior MI after several days of symptoms.  At that time she underwent cardiac catheterization demonstrating chronic occlusion of the right coronary artery with mild to moderate nonobstructive disease involving the LAD and left circumflex.  Medical therapy was recommended.  She has been noted to have severe, presumably ischemic mitral regurgitation dating back to 2020.  Review of outside records through care everywhere suggests mild to moderate mitral regurgitation by echo in December 2018.  LVEF was previously preserved at 55 to 60%.  Most recent echo studies have demonstrated mild to moderate LV dysfunction with LVEF in the 45 to 50% range with hypokinesis of the entire inferior wall.  The patient had done well clinically until April 2021 when she was hospitalized with acute heart failure.  She was found to have paroxysmal atrial fibrillation complicated by symptomatic posttermination pauses with tachybradycardia syndrome.  The patient was started on amiodarone during that admission.  She was readmitted later that  same month with ventricular tachycardia and syncope felt to be related to prolonged QT on amiodarone.  She underwent implantation of a dual-chamber ICD Jun 20, 2019 by Dr. Lovena Le.  She developed recurrent atrial fibrillation with RVR and is rehospitalized October 21, 2019.  She converted back to sinus rhythm with IV diltiazem.  An echocardiogram has again demonstrated moderately severe mitral regurgitation and request for structural heart consultation for consideration of transcatheter edge-to-edge mitral valve repair is made.  The patient is feeling much better.  She denies any chest discomfort, heart palpitations, or shortness of breath at present.  She is noted to ambulate with a walker.  She is otherwise functionally independent.  She denies orthopnea or PND.  States that her breathing has been good since she was hospitalized for heart failure and treated with diuretic therapy.  In reviewing her records, there have been several notations of consideration of MitraClip.  The patient has not been inclined to pursue invasive therapies in the setting of her advanced age and breast cancer.  She notes that she is undergoing hormonal treatment of localized stage I breast cancer at present.   Past Medical History:  Diagnosis Date  . Acute CHF (congestive heart failure) ([place]) 05/25/2019  . Anemia associated with stage 4 chronic renal failure ([place]) 05/17/2019  . Aortic stenosis 02/14/2019  . Arthritis   . CAD (coronary artery disease) 08/16/2018  . Chronic kidney disease   . Chronic systolic dysfunction of left ventricle   . CKD stage G4/A1, GFR 15-29 and albumin creatinine ratio <30 mg/g (HCC) 05/17/2019  . Coronary artery disease   .  Diabetes mellitus without complication ([place])   . Diverticulitis   . History of MI (myocardial infarction) 08/16/2018  . HTN (hypertension) 08/16/2018  . Hyperlipidemia associated with type 2 diabetes mellitus ([place]) 08/16/2018  . Hypertension   . Hypothyroid 08/16/2018  .  Hypothyroidism   . ICD (implantable cardioverter-defibrillator) in place 07/06/2019  . Malignant neoplasm of upper-outer quadrant of left breast in female, estrogen receptor positive ([place]) 03/02/2019  . Mitral regurgitation moderate to severe based on echocardiogram in summer 2020 11/04/2018  . Myocardial infarction ([place])   . Pacemaker 06/29/2019  . Paroxysmal atrial fibrillation (HCC)   . Pleural effusion on left 06/29/2019  . Sinus bradycardia 06/17/2019  . Thyroid disease   . Transaminitis 06/17/2019  . Type 2 diabetes mellitus with other circulatory complications ([place]) 1/61/0960  . V-tach ([place]) 06/17/2019    Current Facility-Administered Medications  Medication Dose Route Frequency Provider Last Rate Last Admin  . acetaminophen (TYLENOL) tablet 650 mg  650 mg Oral Q4H PRN Meade Maw, MD      . anastrozole (ARIMIDEX) tablet 1 mg  1 mg Oral Daily Meade Maw, MD   1 mg at 10/24/19 0824  . apixaban (ELIQUIS) tablet 2.5 mg  2.5 mg Oral BID Meade Maw, MD   2.5 mg at 10/24/19 0824  . atorvastatin (LIPITOR) tablet 40 mg  40 mg Oral q1800 Meade Maw, MD   40 mg at 10/23/19 1719  . diltiazem (CARDIZEM CD) 24 hr capsule 120 mg  120 mg Oral Daily Cherlynn Kaiser A, MD   120 mg at 10/24/19 0920  . levothyroxine (SYNTHROID) tablet 150 mcg  150 mcg Oral QAC breakfast Meade Maw, MD   150 mcg at 10/24/19 0544  . metoprolol tartrate (LOPRESSOR) tablet 25 mg  25 mg Oral BID Cherlynn Kaiser A, MD      . ondansetron [facility]) injection 4 mg  4 mg Intravenous Q6H PRN Akhter, Lacie Scotts, MD        ALLERGIES:   Amiodarone, Aranesp (albumin free) [darbepoetin alfa], and Tape   SOCIAL HISTORY:  The patient  reports that she has quit smoking. She has never used smokeless tobacco. She reports current alcohol use. She reports previous drug use.   FAMILY HISTORY:  The patient's family history includes Arthritis in her daughter, father, mother, and son; Depression in her  maternal aunt; Heart attack in her father; Heart disease in her father and mother; Hyperlipidemia in her maternal aunt; Hypertension in her maternal aunt and mother.   REVIEW OF SYSTEMS:  All systems are reviewed and negative.   PHYSICAL EXAM: VS:  BP (!) 160/56 (BP Location: Left Arm)   Pulse 63   Temp 98.4 F (36.9 C) (Oral)   Resp 18   Ht 5\' 4"  (1.626 m)   Wt 74.9 kg   SpO2 99%   BMI 28.34 kg/m  , BMI Body mass index is 28.34 kg/m. GEN: Well nourished, well developed, elderly woman in no acute distress HEENT: normal Neck: No JVD. carotids 2+ without bruits or masses Cardiac: The heart is RRR with a 2/6 harsh early to mid peaking systolic murmur at the right upper sternal border and left lower sternal border.  No edema. Pedal pulses 2+ = bilaterally  Respiratory:  clear to auscultation bilaterally GI: soft, nontender, nondistended, + BS MS: no deformity or atrophy Skin: warm and dry, no rash Neuro:  Strength and sensation are intact Psych: euthymic mood, full affect  RECENT LABS: 05/25/2019: TSH 1.130  09/02/2019: NT-Pro BNP 34,742 10/20/2019: ALT 16; B Natriuretic Peptide 516.6 10/21/2019: Magnesium 2.2 10/23/2019: BUN 67; Creatinine, Ser 2.24; Hemoglobin 10.0; Platelets 238; Potassium 4.7; Sodium 136  12/29/2018: Cholesterol 109; HDL 48.40; LDL Cholesterol 30; Total CHOL/HDL Ratio 2; Triglycerides 154.0; VLDL 30.8   Estimated Creatinine Clearance: 17.2 mL/min (A) (by C-G formula based on SCr of 2.24 mg/dL (H)).   Wt Readings from Last 3 Encounters:  10/24/19 74.9 kg  10/21/19 74.4 kg  10/20/19 73.8 kg     CARDIAC STUDIES:  Echo:  IMPRESSIONS    1. Severe mitral valve regurgitation. Moderate restriction of posterior  mitral leaflet, with posteriorly directed eccentric jet. Secondary mitral  regurgitation, likely Carpentier type IIIB. Quantitation by PISA suggests  moderate regurgitation however  there is splaying of color flow Doppler and the dominant jet demonstrates    Coanda effect. These finding in combination suggest severe mitral valve  regurgitation. The mitral valve is grossly normal. Severe mitral valve  regurgitation.  2. Left ventricular ejection fraction, by estimation, is 45 to 50%. The  left ventricle has mildly decreased function. The left ventricle  demonstrates regional wall motion abnormalities (see scoring  diagram/findings for description).  3. Right ventricular systolic function is moderately reduced.  4. Left atrial size was severely dilated.  5. Aortic valve regurgitation is trivial.  6. The inferior vena cava is dilated in size with <50% respiratory  variability, suggesting right atrial pressure of 15 mmHg.   Comparison(s): A prior study was performed on 05/25/19. No significant  change from prior study. Prior images reviewed side by side.   STS RISK CALCULATOR: Isolated mitral valve replacement: Risk of Mortality: 12.913% Renal Failure: 17.181% Permanent Stroke: 5.431% Prolonged Ventilation: 21.209% DSW Infection: 0.099% Reoperation: 7.636% Morbidity or Mortality: 43.459% Short Length of Stay: 5.583% Long Length of Stay: 28.514%  Isolated Mitral valve repair: Risk of Mortality: 10.520% Renal Failure: 23.765% Permanent Stroke: 1.656% Prolonged Ventilation: 18.343% DSW Infection: 0.051% Reoperation: 4.585% Morbidity or Mortality: 35.046% Short Length of Stay: 7.463% Long Length of Stay: 32.525%  ASSESSMENT AND PLAN: 1.  Severe, stage mitral regurgitation 2.  Paroxysmal atrial fibrillation 3.  Chronic combined systolic and diastolic heart failure 4.  Coronary artery disease with known occlusion of the right coronary artery and extensive left to right collaterals from cardiac catheterization in November 2016.  Mild to moderate nonobstructive LAD and circumflex disease noted at that time. 5. Mild-moderate aortic stenosis.   I have reviewed the natural history of mitral regurgitation with the  patient and their family members who are present today. We have discussed the limitations of medical therapy and the poor prognosis associated with symptomatic mitral regurgitation. We have also reviewed potential treatment options, including palliative medical therapy, conventional surgical mitral valve repair or replacement, and percutaneous mitral valve therapies such as edge-to-edge mitral valve approximation with MitraClip. We discussed treatment options in the context of this patient's specific comorbid medical conditions.  The patient has significant comorbid medical problems as outlined above.  Most importantly, she suffers from stage IV chronic kidney disease with GFR 15-30, coronary artery disease status post remote inferior wall MI, recurrent atrial fibrillation, history of ventricular tachycardia now status post ICD, and advanced age.  I personally reviewed her echo images which demonstrate mild to moderate calcific aortic stenosis, moderate segmental LV systolic dysfunction, and 3-4+ mitral regurgitation, likely ischemic in etiology.  I think it would be reasonable to consider transcatheter edge-to-edge mitral valve repair in this patient who has been hospitalized for heart  failure and now has recurrent atrial fibrillation.  This has been discussed with the patient several times based on my review of outpatient records and she has not been inclined to pursue treatment.  She is willing to undergo transesophageal echo to see if she has anatomic features suitable for edge-to-edge mitral valve repair.  She would prefer to have this done after discharge from the hospital.  Pending that evaluation, we would have to consider the risk/benefit of pursuing limited dye coronary angiography in the context of her stage IV kidney disease.  I will discuss her case with the primary team who might consider hospital discharge in the near future with outpatient transesophageal echo.  I will arrange outpatient structural  heart follow-up after her transesophageal echo is completed.  I did review the risks, indications, and alternatives of transesophageal echo with the patient today.  She understands and is willing to proceed.  Deatra James 10/24/2019 12:12 PM     [address] [place] [address]  [phone] (office) [phone] (fax)

## 2019-10-24 NOTE — Progress Notes (Signed)
Patient d/c'd via wheelchair to private vehicle in stable condition. No further questions

## 2019-10-24 NOTE — Progress Notes (Signed)
Progress Note  Patient Name: Lauren Walker Date of Encounter: 10/24/2019  Primary Cardiologist: Jenne Campus, MD   Subjective   Paced rhythm, she feels well. We discussed contributing factors to afib  Inpatient Medications    Scheduled Meds: . anastrozole  1 mg Oral Daily  . apixaban  2.5 mg Oral BID  . atorvastatin  40 mg Oral q1800  . diltiazem  120 mg Oral Daily  . levothyroxine  150 mcg Oral QAC breakfast  . metoprolol tartrate  25 mg Oral BID   Continuous Infusions:  PRN Meds: acetaminophen, ondansetron (ZOFRAN) IV   Vital Signs    Vitals:   10/23/19 2346 10/24/19 0500 10/24/19 0545 10/24/19 0748  BP: 108/66   (!) 160/56  Pulse: 65  65 63  Resp: 18  18 18   Temp: 98.7 F (37.1 C)  (!) 97.5 F (36.4 C) 98.4 F (36.9 C)  TempSrc: Oral  Oral Oral  SpO2: 99%  99% 99%  Weight:  74.9 kg    Height:        Intake/Output Summary (Last 24 hours) at 10/24/2019 0905 Last data filed at 10/24/2019 0547 Gross per 24 hour  Intake 355 ml  Output 250 ml  Net 105 ml   Filed Weights   10/22/19 0426 10/23/19 0537 10/24/19 0500  Weight: 74.4 kg 75.1 kg 74.9 kg    Telemetry    paced  - Personally Reviewed  ECG    Paced with frequent ectopy- Personally Reviewed  Physical Exam   GEN: No acute distress.   Neck: No JVD Cardiac: RRR, 3/6 HSM Respiratory: Clear to auscultation bilaterally. GI: Soft, nontender, non-distended  MS: No edema; No deformity. Neuro:  Nonfocal  Psych: Normal affect    Labs    Chemistry Recent Labs  Lab 10/20/19 1544 10/20/19 1544 10/21/19 1719 10/22/19 0901 10/23/19 0900  NA 140   < > 131* 137 136  K 4.0   < > 4.0 4.5 4.7  CL 106   < > 98 102 103  CO2 23   < > 23 23 24   GLUCOSE 169*   < > 172* 168* 249*  BUN 64*   < > 68* 64* 67*  CREATININE 1.94*   < > 2.26* 2.37* 2.24*  CALCIUM 8.4*   < > 9.0 9.0 8.8*  PROT 5.6*  --   --   --   --   ALBUMIN 2.9*  --   --   --   --   AST 21  --   --   --   --   ALT 16  --   --   --    --   ALKPHOS 40  --   --   --   --   BILITOT 0.6  --   --   --   --   GFRNONAA 23*   < > 19* 18* 19*  GFRAA 26*   < > 22* 21* 22*  ANIONGAP 11   < > 10 12 9    < > = values in this interval not displayed.     Hematology Recent Labs  Lab 10/21/19 1719 10/22/19 0901 10/23/19 0900  WBC 12.6* 11.6* 9.5  RBC 3.54* 3.43* 3.45*  HGB 10.6* 9.9* 10.0*  HCT 32.9* 32.1* 32.2*  MCV 92.9 93.6 93.3  MCH 29.9 28.9 29.0  MCHC 32.2 30.8 31.1  RDW 12.9 13.0 12.6  PLT 226 227 238    Cardiac EnzymesNo results for input(s): TROPONINI  in the last 168 hours. No results for input(s): TROPIPOC in the last 168 hours.   BNP Recent Labs  Lab 10/20/19 1544  BNP 516.6*     DDimer No results for input(s): DDIMER in the last 168 hours.   Radiology    ECHOCARDIOGRAM LIMITED  Result Date: 10/22/2019    ECHOCARDIOGRAM LIMITED REPORT   Patient Name:   [name] Date of Exam: 10/22/2019 Medical Rec #:  [id number]      Height:       64.0 in Accession #:    [id number]     Weight:       164.0 lb Date of Birth:  16-Feb-1930      BSA:          1.798 m Patient Age:    19 years       BP:           119/44 mmHg Patient Gender: F              HR:           64 bpm. Exam Location:  Inpatient Procedure: Limited Echo, Cardiac Doppler and Limited Color Doppler                              MODIFIED REPORT: This report was modified by Cherlynn Kaiser MD on 10/22/2019 due to revision.  Indications:     Mitral valve insufficiency 424.0 / 134.0  History:         Patient has prior history of Echocardiogram examinations, most                  recent 05/25/2019. CHF, CAD and Previous Myocardial Infarction,                  Pacemaker and Defibrillator, Mitral Valve Disease,                  Arrythmias:Atrial Fibrillation; Risk Factors:Diabetes and                  Hypertension. H/o mitral valve insufficiency.  Sonographer:     Alvino Chapel RCS Referring Phys:  [id number] [name] Diagnosing Phys: Cherlynn Kaiser MD IMPRESSIONS  1.  Severe mitral valve regurgitation. Moderate restriction of posterior mitral leaflet, with posteriorly directed eccentric jet. Secondary mitral regurgitation, likely Carpentier type IIIB. Quantitation by PISA suggests moderate regurgitation however there is splaying of color flow Doppler and the dominant jet demonstrates Coanda effect. These finding in combination suggest severe mitral valve regurgitation. The mitral valve is grossly normal. Severe mitral valve regurgitation.  2. Left ventricular ejection fraction, by estimation, is 45 to 50%. The left ventricle has mildly decreased function. The left ventricle demonstrates regional wall motion abnormalities (see scoring diagram/findings for description).  3. Right ventricular systolic function is moderately reduced.  4. Left atrial size was severely dilated.  5. Aortic valve regurgitation is trivial.  6. The inferior vena cava is dilated in size with <50% respiratory variability, suggesting right atrial pressure of 15 mmHg. Comparison(s): A prior study was performed on 05/25/19. No significant change from prior study. Prior images reviewed side by side. FINDINGS  Left Ventricle: Left ventricular ejection fraction, by estimation, is 45 to 50%. The left ventricle has mildly decreased function. The left ventricle demonstrates regional wall motion abnormalities.  LV Wall Scoring: The basal inferior segment is akinetic. The mid and distal inferior wall is hypokinetic. Right Ventricle:  Right ventricular systolic function is moderately reduced. Left Atrium: Left atrial size was severely dilated. Pericardium: A small pericardial effusion is present. Mitral Valve: Severe mitral valve regurgitation. Moderate restriction of posterior mitral leaflet, with posteriorly directed eccentric jet. Secondary mitral regurgitation, likely Carpentier type IIIB. Quantitation by PISA suggests moderate regurgitation however there is splaying of color flow Doppler and the dominant jet  demonstrates Coanda effect. These finding in combination suggest severe mitral valve regurgitation. The mitral valve is grossly normal. Severe mitral valve regurgitation. Aortic Valve: Aortic valve regurgitation is trivial. There is moderate calcification of the aortic valve. Venous: A systolic blunting flow pattern is recorded from the right lower pulmonary vein. The inferior vena cava is dilated in size with less than 50% respiratory variability, suggesting right atrial pressure of 15 mmHg. LEFT VENTRICLE PLAX 2D LVOT diam:     1.70 cm LVOT Area:     2.27 cm  LEFT ATRIUM              Index LA Vol (A2C):   116.0 ml 64.51 ml/m LA Vol (A4C):   106.0 ml 58.95 ml/m LA Biplane Vol: 113.0 ml 62.84 ml/m  MR Peak grad:    126.6 mmHg MR Mean grad:    87.0 mmHg   SHUNTS MR Vmax:         562.50 cm/s Systemic Diam: 1.70 cm MR Vmean:        443.5 cm/s MR PISA:         2.26 cm MR PISA Eff ROA: 13 mm MR PISA Radius:  0.60 cm Cherlynn Kaiser MD Electronically signed by Cherlynn Kaiser MD Signature Date/Time: 10/22/2019/6:16:48 PM    Final (Updated)     Cardiac Studies    Patient Profile    Assessment & Plan   Principal Problem:   Atrial fibrillation ([place]) Active Problems:   Atrial fibrillation with RVR (HCC)   Atrial fibrillation with rapid ventricular response (HCC)   No amiodarone due to prior VT felt related to amiodarone and QT prolongation.   Surprisingly, she converted to sinus just 15 minutes after use of diltiazem. Though EF is mildly reduced, we may try this to see if she has benefit in rate control. Has not had decompensated HF.  LV is not dilated, however mitral regurgitation is severe with probable restriction of posterior mitral leaflet. Will discuss with structural heart team, if she may benefit from consideration for MitraClip.  She may also need an evaluation with EP given inability to use AAD due to prior arrest with QT prolongation, renal dysfunction, and evidence of structural  HD.  Discussed in detail with patient.   Total time of encounter: 35 minutes total time of encounter, including 20 minutes spent in face-to-face patient care. This time includes coordination of care and counseling regarding management of afib and MR. Remainder of non-face-to-face time involved reviewing chart documents/testing relevant to the patient encounter and documentation in the medical record.  Cherlynn Kaiser, MD [facility]    For questions or updates, please contact [facility] Please consult [email or web address] for contact info under        Signed, [name], MD  10/24/2019, 9:05 AM

## 2019-10-24 NOTE — Discharge Summary (Addendum)
Discharge Summary    Patient ID: Lauren Walker MRN: [id number]; DOB: 01/12/1930  Admit date: 10/21/2019 Discharge date: 10/24/2019  Primary Care Provider: Midge Minium, MD  Primary Cardiologist: Jenne Campus, MD  Primary Electrophysiologist:  None   Discharge Diagnoses    Principal Problem:   Atrial fibrillation [facility]) Active Problems:   Atrial fibrillation with RVR ([facility])   Atrial fibrillation with rapid ventricular response ([facility])   Allergies Allergies  Allergen Reactions   Amiodarone Other (See Comments)    Prolonged QT, VT   Aranesp (Albumin Free) [Darbepoetin Alfa] Other (See Comments)    Patient had cardiac arrest evening after receiving this   Tape Itching, Rash and Other (See Comments)    Reaction was from the surgical tape from cyst removed    Diagnostic Studies/Procedures    ECHO: 10/22/2019  1. Severe mitral valve regurgitation. Moderate restriction of posterior  mitral leaflet, with posteriorly directed eccentric jet. Secondary mitral  regurgitation, likely Carpentier type IIIB. Quantitation by PISA suggests  moderate regurgitation however  there is splaying of color flow Doppler and the dominant jet demonstrates  Coanda effect. These finding in combination suggest severe mitral valve  regurgitation. The mitral valve is grossly normal. Severe mitral valve  regurgitation.   2. Left ventricular ejection fraction, by estimation, is 45 to 50%. The  left ventricle has mildly decreased function. The left ventricle  demonstrates regional wall motion abnormalities (see scoring  diagram/findings for description).   3. Right ventricular systolic function is moderately reduced.   4. Left atrial size was severely dilated.   5. Aortic valve regurgitation is trivial.   6. The inferior vena cava is dilated in size with <50% respiratory  variability, suggesting right atrial pressure of 15 mmHg.   Comparison(s): A prior study was performed on 05/25/19. No  significant  change from prior study. Prior images reviewed side by side.  _____________   History of Present Illness     Lauren Walker is a 86 y.o. female with  a history of combined systolic and diastolic congestive heart failure (past ICD placement for VT and syncope), remote CAD, paroxysmal atrial fibrillation (on Eliquis), moderate to severe mitral regurgitation, CKD, hypertension, dyslipidemia and hypothyroidism, who initially presented to the ER on 09/02 with chest pain and SOB.   Hospital Course     Consultants: Structural Heart, Dr Burt Knack   Initially, she wanted to go home. She converted to A pacing and HR was controlled. BP was elevated. Troponin negative.The initial plan was to add amlodipine 2.5 mg qd and f/u as outpt.   She went to Dr Wendy Poet office 09/03 with complaints of chest tightness. Apparently in the clinic she was noted to be tachycardic with the EKG showing A. fib with RVR. She also was complaining of chest tightness and resting shortness of breath. She was therefore referred back to the [facility] emergency department.  Her pacemaker was interrogated and she was cardioverted with 200 J x 3. She was started on IV Cardizem.  She continued to go in/out of rapid atrial fibrillation versus a paced rhythm.  She had been on apixaban 2.5 mg twice daily and this was continued without interruption.  She was continued on her home dose of levothyroxine.  It is felt that she cannot be on amiodarone due to prior VT felt related to amiodarone and QT prolongation  Her MR was felt to be secondary MR and was initially for medical management.  However, with recurrent atrial fibrillation and the MR  is a possible nidus for the atrial fibrillation, a Structural Heart consult was indicated.  An echo was ordered, results are above.  Heart muscle function is normal with no wall motion abnormalities.    She had a mild elevation in her troponin that was felt consistent with being cardioverted, and her  chest pain resolved once her heart rate improved.  No further ischemic evaluation is indicated.  She was seen by [name] and all data were reviewed.  Her volume status was good.  She is a possible candidate for MitraClip.  She will need a TEE prior to further evaluation by the Structural Heart team.  This can be done as an outpatient.  On 10/24/2019, she was evaluated by [name] and by Dr. Margaretann Loveless.  All data were reviewed.  She was initially in atrial fibrillation, but spontaneously converted to sinus rhythm.  Her diltiazem had been changed to p.o.  She was also on metoprolol.  Her renal function although decreased, is stable.  Her ACE inhibitor was discontinued.  She will resume her Isordil and torsemide at discharge, they were held during her hospital stay.  She needs early follow-up with Dr. Agustin Cree for management.  No further inpatient work-up is indicated and she is considered stable for discharge, to follow-up as an outpatient.  _____________  Discharge Vitals Blood pressure (!) 160/56, pulse 63, temperature 98.4 F (36.9 C), temperature source Oral, resp. rate 18, height 5\' 4"  (1.626 m), weight 74.9 kg, SpO2 99 %.  Filed Weights   10/22/19 0426 10/23/19 0537 10/24/19 0500  Weight: 74.4 kg 75.1 kg 74.9 kg    Labs & Radiologic Studies    CBC Recent Labs    10/21/19 1719 10/21/19 1719 10/22/19 0901 10/23/19 0900  WBC 12.6*   < > 11.6* 9.5  NEUTROABS 8.5*  --   --   --   HGB 10.6*   < > 9.9* 10.0*  HCT 32.9*   < > 32.1* 32.2*  MCV 92.9   < > 93.6 93.3  PLT 226   < > 227 238   < > = values in this interval not displayed.   Basic Metabolic Panel Recent Labs    10/21/19 1719 10/21/19 1719 10/22/19 0901 10/23/19 0900  NA 131*   < > 137 136  K 4.0   < > 4.5 4.7  CL 98   < > 102 103  CO2 23   < > 23 24  GLUCOSE 172*   < > 168* 249*  BUN 68*   < > 64* 67*  CREATININE 2.26*   < > 2.37* 2.24*  CALCIUM 9.0   < > 9.0 8.8*  MG 2.2  --   --   --    < > = values in  this interval not displayed.   Liver Function Tests No results for input(s): AST, ALT, ALKPHOS, BILITOT, PROT, ALBUMIN in the last 72 hours. No results for input(s): LIPASE, AMYLASE in the last 72 hours. High Sensitivity Troponin:   Recent Labs  Lab 10/20/19 1544 10/20/19 1734  TROPONINIHS 18* 18*    BNP Invalid input(s): POCBNP D-Dimer No results for input(s): DDIMER in the last 72 hours. Hemoglobin A1C No results for input(s): HGBA1C in the last 72 hours. Fasting Lipid Panel No results for input(s): CHOL, HDL, LDLCALC, TRIG, CHOLHDL, LDLDIRECT in the last 72 hours. Thyroid Function Tests Lab Results  Component Value Date   TSH 1.130 05/25/2019    _____________  DG Chest 2 View  Result Date: 10/20/2019 CLINICAL DATA:  Chest pain EXAM: CHEST - 2 VIEW COMPARISON:  06/24/2019 FINDINGS: Tiny left pleural effusion is present, decreased in size since prior examination. Lungs are well expanded and are otherwise clear. No pneumothorax. Right subclavian pacemaker defibrillator is unchanged. Cardiac size is mildly enlarged, unchanged. Pulmonary vascularity is normal. No acute bone abnormality. IMPRESSION: 1. Tiny left pleural effusion, decreased in size since prior examination. 2. Stable mild cardiomegaly. Electronically Signed   By: Fidela Salisbury MD   On: 10/20/2019 15:45   ECHOCARDIOGRAM LIMITED  Result Date: 10/22/2019    ECHOCARDIOGRAM LIMITED REPORT   Patient Name:   Lauren Walker Date of Exam: 10/22/2019 Medical Rec #:  [id number]      Height:       64.0 in Accession #:    [id number]     Weight:       164.0 lb Date of Birth:  07-09-1929      BSA:          1.798 m Patient Age:    61 years       BP:           119/44 mmHg Patient Gender: F              HR:           64 bpm. Exam Location:  Inpatient Procedure: Limited Echo, Cardiac Doppler and Limited Color Doppler                              MODIFIED REPORT: This report was modified by Cherlynn Kaiser MD on 10/22/2019 due to revision.   Indications:     Mitral valve insufficiency 424.0 / 134.0  History:         Patient has prior history of Echocardiogram examinations, most                  recent 05/25/2019. CHF, CAD and Previous Myocardial Infarction,                  Pacemaker and Defibrillator, Mitral Valve Disease,                  Arrythmias:Atrial Fibrillation; Risk Factors:Diabetes and                  Hypertension. H/o mitral valve insufficiency.  Sonographer:     Alvino Chapel RCS Referring Phys:  [id number] [name] Diagnosing Phys: Cherlynn Kaiser MD IMPRESSIONS  1. Severe mitral valve regurgitation. Moderate restriction of posterior mitral leaflet, with posteriorly directed eccentric jet. Secondary mitral regurgitation, likely Carpentier type IIIB. Quantitation by PISA suggests moderate regurgitation however there is splaying of color flow Doppler and the dominant jet demonstrates Coanda effect. These finding in combination suggest severe mitral valve regurgitation. The mitral valve is grossly normal. Severe mitral valve regurgitation.  2. Left ventricular ejection fraction, by estimation, is 45 to 50%. The left ventricle has mildly decreased function. The left ventricle demonstrates regional wall motion abnormalities (see scoring diagram/findings for description).  3. Right ventricular systolic function is moderately reduced.  4. Left atrial size was severely dilated.  5. Aortic valve regurgitation is trivial.  6. The inferior vena cava is dilated in size with <50% respiratory variability, suggesting right atrial pressure of 15 mmHg. Comparison(s): A prior study was performed on 05/25/19. No significant change from prior study. Prior images reviewed side by side. FINDINGS  Left Ventricle: Left ventricular  ejection fraction, by estimation, is 45 to 50%. The left ventricle has mildly decreased function. The left ventricle demonstrates regional wall motion abnormalities.  LV Wall Scoring: The basal inferior segment is akinetic. The mid  and distal inferior wall is hypokinetic. Right Ventricle: Right ventricular systolic function is moderately reduced. Left Atrium: Left atrial size was severely dilated. Pericardium: A small pericardial effusion is present. Mitral Valve: Severe mitral valve regurgitation. Moderate restriction of posterior mitral leaflet, with posteriorly directed eccentric jet. Secondary mitral regurgitation, likely Carpentier type IIIB. Quantitation by PISA suggests moderate regurgitation however there is splaying of color flow Doppler and the dominant jet demonstrates Coanda effect. These finding in combination suggest severe mitral valve regurgitation. The mitral valve is grossly normal. Severe mitral valve regurgitation. Aortic Valve: Aortic valve regurgitation is trivial. There is moderate calcification of the aortic valve. Venous: A systolic blunting flow pattern is recorded from the right lower pulmonary vein. The inferior vena cava is dilated in size with less than 50% respiratory variability, suggesting right atrial pressure of 15 mmHg. LEFT VENTRICLE PLAX 2D LVOT diam:     1.70 cm LVOT Area:     2.27 cm  LEFT ATRIUM              Index LA Vol (A2C):   116.0 ml 64.51 ml/m LA Vol (A4C):   106.0 ml 58.95 ml/m LA Biplane Vol: 113.0 ml 62.84 ml/m  MR Peak grad:    126.6 mmHg MR Mean grad:    87.0 mmHg   SHUNTS MR Vmax:         562.50 cm/s Systemic Diam: 1.70 cm MR Vmean:        443.5 cm/s MR PISA:         2.26 cm MR PISA Eff ROA: 13 mm MR PISA Radius:  0.60 cm Cherlynn Kaiser MD Electronically signed by Cherlynn Kaiser MD Signature Date/Time: 10/22/2019/6:16:48 PM    Final (Updated)    Disposition   Pt is being discharged home today in improved condition.  Follow-up Plans & Appointments     Follow-up Information     Park Liter, MD. Schedule an appointment as soon as possible for a visit.   Specialty: Cardiology Contact information: [place] [address] [phone]          [name], MD Follow up.   Specialties: Cardiology, Radiology Why: Tentative date for the outpatient TEE is 9/15.  The office will call. Contact information: [address] [place]  [address] [phone]         Sherren Mocha, MD Follow up.   Specialty: Cardiology Why: See after the TEE. Contact information: [address] [place] [address] [phone]                Discharge Instructions     Diet - low sodium heart healthy   Complete by: As directed    Increase activity slowly   Complete by: As directed        Discharge Medications   Allergies as of 10/24/2019       Reactions   Amiodarone Other (See Comments)   Prolonged QT, VT   Aranesp (albumin Free) [darbepoetin Alfa] Other (See Comments)   Patient had cardiac arrest evening after receiving this   Tape Itching, Rash, Other (See Comments)   Reaction was from the surgical tape from cyst removed        Medication List     STOP  taking these medications    amLODipine 2.5 MG tablet Commonly known as: NORVASC   carvedilol 3.125 MG tablet Commonly known as: COREG   quinapril 10 MG tablet Commonly known as: ACCUPRIL       TAKE these medications    acetaminophen 650 MG CR tablet Commonly known as: TYLENOL Take 650 mg by mouth every 8 (eight) hours as needed for pain.   anastrozole 1 MG tablet Commonly known as: ARIMIDEX TAKE 1 TABLET BY MOUTH EVERY DAY   apixaban 2.5 MG Tabs tablet Commonly known as: Eliquis Take 1 tablet (2.5 mg total) by mouth 2 (two) times daily.   atorvastatin 40 MG tablet Commonly known as: LIPITOR TAKE 1 TABLET BY MOUTH EVERY DAY   Azelastine-Fluticasone 137-50 MCG/ACT Susp Place 1 spray into both nostrils 2 (two) times daily.   b complex vitamins capsule Take 1 capsule by mouth daily.   Biotin 1 MG Caps Take 1 mg by mouth daily.   diltiazem 120 MG 24 hr capsule Commonly known as: CARDIZEM CD Take 1  capsule (120 mg total) by mouth daily. Start taking on: October 25, 2019   fluticasone 50 MCG/ACT nasal spray Commonly known as: FLONASE Place 2 sprays into both nostrils daily.   Iron 325 (65 Fe) MG Tabs Take 325 mg by mouth daily with breakfast.   isosorbide dinitrate 20 MG tablet Commonly known as: ISORDIL TAKE 2 TABLETS (40MG ) BY MOUTH TWICE DAILY What changed: See the new instructions.   levothyroxine 150 MCG tablet Commonly known as: SYNTHROID TAKE 1 TABLET BY MOUTH DAILY BEFORE BREAKFAST. What changed: See the new instructions.   magnesium oxide 400 MG tablet Commonly known as: MAG-OX Take 400 mg by mouth at bedtime.   metoprolol tartrate 25 MG tablet Commonly known as: LOPRESSOR Take 1 tablet (25 mg total) by mouth 2 (two) times daily.   nitroGLYCERIN 0.4 MG SL tablet Commonly known as: NITROSTAT Place 1 tablet (0.4 mg total) under the tongue every 5 (five) minutes as needed for chest pain.   torsemide 20 MG tablet Commonly known as: DEMADEX Take 1.5 tablets (30 mg total) by mouth daily.   vitamin C 500 MG tablet Commonly known as: ASCORBIC ACID Take 500 mg by mouth daily.   Vitamin D3 50 MCG (2000 UT) Tabs Take 2,000 Units by mouth at bedtime.           Outstanding Labs/Studies   None  Duration of Discharge Encounter   Greater than 30 minutes including physician time.  Signed, Rosaria Ferries, PA-C 10/24/2019, 3:15 PM

## 2019-10-25 ENCOUNTER — Telehealth: Payer: Self-pay | Admitting: Cardiology

## 2019-10-25 NOTE — Telephone Encounter (Signed)
Patient c/o Palpitations:  High priority if patient c/o lightheadedness, shortness of breath, or chest pain  1) How long have you had palpitations/irregular HR/ Afib? Are you having the symptoms now? Yes   2) Are you currently experiencing lightheadedness, SOB or CP? SOB  3) Do you have a history of afib (atrial fibrillation) or irregular heart rhythm? Yes  4) Have you checked your BP or HR? (document readings if available): No   5) Are you experiencing any other symptoms? No

## 2019-10-25 NOTE — Telephone Encounter (Signed)
Called patient got her scheduled with Dr. Harriet Masson in [place] tomorrow. She verbally understood no further questions.

## 2019-10-25 NOTE — Telephone Encounter (Signed)
Called patient. She reports she was discharged from hospital yesterday and she went back into afib last night. She is having shortness of breath heart rate is 62-120. She was started on diltiazem 120 which worked in hospital but not working now. Will consult with Dr. Agustin Cree.

## 2019-10-25 NOTE — Telephone Encounter (Signed)
She needs to have EKG done to confirm a.fibr before treating her, Ideally she needs to be seen in [place] tomorrow

## 2019-10-26 ENCOUNTER — Encounter: Payer: Self-pay | Admitting: Cardiology

## 2019-10-26 ENCOUNTER — Ambulatory Visit (INDEPENDENT_AMBULATORY_CARE_PROVIDER_SITE_OTHER): Payer: Medicare Other | Admitting: Cardiology

## 2019-10-26 ENCOUNTER — Other Ambulatory Visit: Payer: Self-pay

## 2019-10-26 VITALS — BP 128/78 | HR 131 | Ht 64.0 in | Wt 167.0 lb

## 2019-10-26 DIAGNOSIS — I10 Essential (primary) hypertension: Secondary | ICD-10-CM | POA: Diagnosis not present

## 2019-10-26 DIAGNOSIS — E1169 Type 2 diabetes mellitus with other specified complication: Secondary | ICD-10-CM | POA: Diagnosis not present

## 2019-10-26 DIAGNOSIS — I4891 Unspecified atrial fibrillation: Secondary | ICD-10-CM | POA: Diagnosis not present

## 2019-10-26 DIAGNOSIS — I340 Nonrheumatic mitral (valve) insufficiency: Secondary | ICD-10-CM

## 2019-10-26 DIAGNOSIS — E1159 Type 2 diabetes mellitus with other circulatory complications: Secondary | ICD-10-CM

## 2019-10-26 DIAGNOSIS — I2510 Atherosclerotic heart disease of native coronary artery without angina pectoris: Secondary | ICD-10-CM | POA: Diagnosis not present

## 2019-10-26 DIAGNOSIS — E785 Hyperlipidemia, unspecified: Secondary | ICD-10-CM

## 2019-10-26 MED ORDER — TORSEMIDE 20 MG PO TABS: 40.0000 mg | ORAL_TABLET | Freq: Two times a day (BID) | ORAL | 3 refills | Status: DC

## 2019-10-26 MED ORDER — DILTIAZEM HCL ER COATED BEADS 180 MG PO CP24: 180.0000 mg | ORAL_CAPSULE | Freq: Every day | ORAL | 3 refills | Status: DC

## 2019-10-26 NOTE — Patient Instructions (Signed)
Medication Instructions:  Your physician has recommended you make the following change in your medication:  INCREASE: Cardizem 180 mg take one tablet by mouth daily INCREASE: Torsemide 40 mg take two tablets by mouth twice daily.  *If you need a refill on your cardiac medications before your next appointment, please call your pharmacy*   Lab Work: Your physician recommends that you return for lab work in: TODAY BMP, CBC, Mag If you have labs (blood work) drawn today and your tests are completely normal, you will receive your results only by: Marland Kitchen MyChart Message (if you have MyChart) OR . A paper copy in the mail If you have any lab test that is abnormal or we need to change your treatment, we will call you to review the results.   Testing/Procedures: None   Follow-Up: At [facility], you and your health needs are our priority.  As part of our continuing mission to provide you with exceptional heart care, we have created designated Provider Care Teams.  These Care Teams include your primary Cardiologist (physician) and Advanced Practice Providers (APPs -  Physician Assistants and Nurse Practitioners) who all work together to provide you with the care you need, when you need it.  We recommend signing up for the patient portal called "MyChart".  Sign up information is provided on this After Visit Summary.  MyChart is used to connect with patients for Virtual Visits (Telemedicine).  Patients are able to view lab/test results, encounter notes, upcoming appointments, etc.  Non-urgent messages can be sent to your provider as well.   To learn more about what you can do with MyChart, go to [email or web address].    Your next appointment:   1 month(s)  The format for your next appointment:   In Person  Provider:   Jenne Campus, MD   Other Instructions

## 2019-10-26 NOTE — Progress Notes (Signed)
Cardiology Office Note:    Date:  10/26/2019   ID:  Lauren Walker, DOB [birth year], MRN [id number]  PCP:  Midge Minium, MD  Cardiologist:  Jenne Campus, MD  Electrophysiologist:  None   Referring MD: Midge Minium, MD   " My heart rate is up and I am tired"  History of Present Illness:    Lauren Walker is a 86 y.o. female with past medical history significant for breast CA only on hormonal therapy, chronic both systolic and diastolic congestive heart failure, status post ICD, remote history of coronary artery disease, paroxysmal atrial fibrillation, moderate to severe mitral regurgitation, chronic kidney failure, essential hypertension, dyslipidemia, hypothyroidism,   She did she Dr Wendy Poet office 09/03with complaints of chest tightness.  She was noted to be atrial fibrillation rapid ventricular rate.  Patient was taken to the [facility] emergency department where she was subsequently transferred to inpatient.  During her inpatient stay her pacemaker interrogation showed atrial fibrillation rapid ventricular rate the patient was cardioverted 200 J x 3.  She was continued on her apixaban.  She did have an echocardiogram with concerned that her mitral regurgitation is the negatives of her recurrent atrial fibrillation rapid ventricular rate.  She was deemed to be a candidate for MitraClip.  And has been scheduled for a follow-up TEE.  She is here today for follow-up visit.  Past Medical History:  Diagnosis Date  . Acute CHF (congestive heart failure) ([place]) 05/25/2019  . Anemia associated with stage 4 chronic renal failure ([place]) 05/17/2019  . Aortic stenosis 02/14/2019  . Arthritis   . CAD (coronary artery disease) 08/16/2018  . Chronic kidney disease   . Chronic systolic dysfunction of left ventricle   . CKD stage G4/A1, GFR 15-29 and albumin creatinine ratio <30 mg/g (HCC) 05/17/2019  . Coronary artery disease   . Diabetes mellitus without complication  ([place])   . Diverticulitis   . History of MI (myocardial infarction) 08/16/2018  . HTN (hypertension) 08/16/2018  . Hyperlipidemia associated with type 2 diabetes mellitus ([place]) 08/16/2018  . Hypertension   . Hypothyroid 08/16/2018  . Hypothyroidism   . ICD (implantable cardioverter-defibrillator) in place 07/06/2019  . Malignant neoplasm of upper-outer quadrant of left breast in female, estrogen receptor positive ([place]) 03/02/2019  . Mitral regurgitation moderate to severe based on echocardiogram in summer 2020 11/04/2018  . Myocardial infarction ([place])   . Pacemaker 06/29/2019  . Paroxysmal atrial fibrillation (HCC)   . Pleural effusion on left 06/29/2019  . Sinus bradycardia 06/17/2019  . Thyroid disease   . Transaminitis 06/17/2019  . Type 2 diabetes mellitus with other circulatory complications ([place]) 0/62/6948  . V-tach [facility]) 06/17/2019    Past Surgical History:  Procedure Laterality Date  . APPENDECTOMY  1936  . ICD IMPLANT N/A 06/20/2019   Procedure: ICD IMPLANT;  Surgeon: Evans Lance, MD;  Location: [facility] [place] CV LAB;  Service: Cardiovascular;  Laterality: N/A;  . IR THORACENTESIS ASP PLEURAL SPACE W/IMG GUIDE  05/27/2019  . PARS PLANA VITRECTOMY Right 12/04/2018   Procedure: RETINAL DETACHMENT REPAIR PPV 25 GAUGE WITH ENDO LASER AIR/FLUID EXCHANGE SF6 GAS INJECTION;  Surgeon: Jalene Mullet, MD;  Location: [facility];  Service: Ophthalmology;  Laterality: Right;  . TONSILLECTOMY      Current Medications: Current Meds  Medication Sig  . acetaminophen (TYLENOL) 650 MG CR tablet Take 650 mg by mouth every 8 (eight) hours as needed for pain.  Marland Kitchen anastrozole (ARIMIDEX) 1 MG tablet TAKE  1 TABLET BY MOUTH EVERY DAY  . apixaban (ELIQUIS) 2.5 MG TABS tablet Take 1 tablet (2.5 mg total) by mouth 2 (two) times daily.  Marland Kitchen atorvastatin (LIPITOR) 40 MG tablet TAKE 1 TABLET BY MOUTH EVERY DAY  . Azelastine-Fluticasone 137-50 MCG/ACT SUSP Place 1 spray into both nostrils 2 (two) times daily.   Marland Kitchen b  complex vitamins capsule Take 1 capsule by mouth daily.  . Biotin 1 MG CAPS Take 1 mg by mouth daily.   . Cholecalciferol (VITAMIN D3) 50 MCG (2000 UT) TABS Take 2,000 Units by mouth at bedtime.   . Ferrous Sulfate (IRON) 325 (65 Fe) MG TABS Take 325 mg by mouth daily with breakfast.   . fluticasone (FLONASE) 50 MCG/ACT nasal spray Place 2 sprays into both nostrils daily.  . isosorbide dinitrate (ISORDIL) 20 MG tablet TAKE 2 TABLETS (40MG ) BY MOUTH TWICE DAILY  . levothyroxine (SYNTHROID) 150 MCG tablet TAKE 1 TABLET BY MOUTH DAILY BEFORE BREAKFAST.  . magnesium oxide (MAG-OX) 400 MG tablet Take 400 mg by mouth at bedtime.   . metoprolol tartrate (LOPRESSOR) 25 MG tablet Take 1 tablet (25 mg total) by mouth 2 (two) times daily.  . nitroGLYCERIN (NITROSTAT) 0.4 MG SL tablet Place 1 tablet (0.4 mg total) under the tongue every 5 (five) minutes as needed for chest pain.  . vitamin C (ASCORBIC ACID) 500 MG tablet Take 500 mg by mouth daily.  . [DISCONTINUED] diltiazem (CARDIZEM CD) 120 MG 24 hr capsule Take 1 capsule (120 mg total) by mouth daily.  . [DISCONTINUED] torsemide (DEMADEX) 20 MG tablet Take 1.5 tablets (30 mg total) by mouth daily.     Allergies:   Amiodarone, Aranesp (albumin free) [darbepoetin alfa], and Tape   Social History   Socioeconomic History  . Marital status: Married    Spouse name: Not on file  . Number of children: Not on file  . Years of education: Not on file  . Highest education level: Not on file  Occupational History  . Not on file  Tobacco Use  . Smoking status: Former Research scientist (life sciences)  . Smokeless tobacco: Never Used  Vaping Use  . Vaping Use: Never used  Substance and Sexual Activity  . Alcohol use: Yes    Comment: Very rare  . Drug use: Not Currently  . Sexual activity: Not Currently  Other Topics Concern  . Not on file  Social History Narrative  . Not on file   Social Determinants of Health   Financial Resource Strain:   . Difficulty of Paying Living  Expenses: Not on file  Food Insecurity:   . Worried About Charity fundraiser in the Last Year: Not on file  . Ran Out of Food in the Last Year: Not on file  Transportation Needs:   . Lack of Transportation (Medical): Not on file  . Lack of Transportation (Non-Medical): Not on file  Physical Activity:   . Days of Exercise per Week: Not on file  . Minutes of Exercise per Session: Not on file  Stress:   . Feeling of Stress : Not on file  Social Connections:   . Frequency of Communication with Friends and Family: Not on file  . Frequency of Social Gatherings with Friends and Family: Not on file  . Attends Religious Services: Not on file  . Active Member of Clubs or Organizations: Not on file  . Attends Archivist Meetings: Not on file  . Marital Status: Not on file  Family History: The patient's family history includes Arthritis in her daughter, father, mother, and son; Depression in her maternal aunt; Heart attack in her father; Heart disease in her father and mother; Hyperlipidemia in her maternal aunt; Hypertension in her maternal aunt and mother.  ROS:   Review of Systems  Constitution: Negative for decreased appetite, fever and weight gain.  HENT: Negative for congestion, ear discharge, hoarse voice and sore throat.   Eyes: Negative for discharge, redness, vision loss in right eye and visual halos.  Cardiovascular: Negative for chest pain, dyspnea on exertion, leg swelling, orthopnea and palpitations.  Respiratory: Negative for cough, hemoptysis, shortness of breath and snoring.   Endocrine: Negative for heat intolerance and polyphagia.  Hematologic/Lymphatic: Negative for bleeding problem. Does not bruise/bleed easily.  Skin: Negative for flushing, nail changes, rash and suspicious lesions.  Musculoskeletal: Negative for arthritis, joint pain, muscle cramps, myalgias, neck pain and stiffness.  Gastrointestinal: Negative for abdominal pain, bowel incontinence,  diarrhea and excessive appetite.  Genitourinary: Negative for decreased libido, genital sores and incomplete emptying.  Neurological: Negative for brief paralysis, focal weakness, headaches and loss of balance.  Psychiatric/Behavioral: Negative for altered mental status, depression and suicidal ideas.  Allergic/Immunologic: Negative for HIV exposure and persistent infections.    EKGs/Labs/Other Studies Reviewed:    The following studies were reviewed today:   EKG:  The ekg ordered today demonstrates atrial fibrillation rapid ventricular rate, heart rate 131 bpm.  Echo ECHO: 10/22/2019 1. Severe mitral valve regurgitation. Moderate restriction of posterior  mitral leaflet, with posteriorly directed eccentric jet. Secondary mitral  regurgitation, likely Carpentier type IIIB. Quantitation by PISA suggests moderate regurgitation however there is splaying of color flow Doppler and the dominant jet demonstrates Coanda effect. These finding in combination suggest severe mitral valve regurgitation. The mitral valve is grossly normal. Severe mitral valve regurgitation.  2. Left ventricular ejection fraction, by estimation, is 45 to 50%. The left ventricle has mildly decreased function. The left ventricle demonstrates regional wall motion abnormalities (see scoring  diagram/findings for description).  3. Right ventricular systolic function is moderately reduced.  4. Left atrial size was severely dilated.  5. Aortic valve regurgitation is trivial.  6. The inferior vena cava is dilated in size with <50% respiratory variability, suggesting right atrial pressure of 15 mmHg.     Recent Labs: 05/25/2019: TSH 1.130 09/02/2019: NT-Pro BNP 12,348 10/20/2019: ALT 16; B Natriuretic Peptide 516.6 10/21/2019: Magnesium 2.2 10/23/2019: BUN 67; Creatinine, Ser 2.24; Hemoglobin 10.0; Platelets 238; Potassium 4.7; Sodium 136  Recent Lipid Panel    Component Value Date/Time   CHOL 109 12/29/2018 1415   TRIG  154.0 (H) 12/29/2018 1415   HDL 48.40 12/29/2018 1415   CHOLHDL 2 12/29/2018 1415   VLDL 30.8 12/29/2018 1415   LDLCALC 30 12/29/2018 1415    Physical Exam:    VS:  BP 128/78   Pulse (!) 131   Ht 5\' 4"  (1.626 m)   Wt 167 lb 0.6 oz (75.8 kg)   SpO2 97%   BMI 28.67 kg/m     Wt Readings from Last 3 Encounters:  10/26/19 167 lb 0.6 oz (75.8 kg)  10/24/19 165 lb 1.6 oz (74.9 kg)  10/21/19 164 lb (74.4 kg)     GEN: Well nourished, well developed in no acute distress HEENT: Normal NECK: No JVD; No carotid bruits LYMPHATICS: No lymphadenopathy CARDIAC: S1S2 noted,RRR, 3 out of 6 holosystolic murmur murmurs, rubs, gallops RESPIRATORY:  Clear to auscultation without rales, wheezing or rhonchi  ABDOMEN: Soft, non-tender, non-distended, +bowel sounds, no guarding. EXTREMITIES: +1 bilateral leg edema, No cyanosis, no clubbing MUSCULOSKELETAL:  No deformity  SKIN: Warm and dry NEUROLOGIC:  Alert and oriented x 3, non-focal PSYCHIATRIC:  Normal affect, good insight  ASSESSMENT:    1. Atrial fibrillation with RVR ([place])   2. Coronary artery disease involving native coronary artery of native heart without angina pectoris   3. Hyperlipidemia associated with type 2 diabetes mellitus ([place])   4. Essential hypertension   5. Type 2 diabetes mellitus with other circulatory complications (HCC)   6. Nonrheumatic mitral valve regurgitation    PLAN:     1.  She is in atrial fibrillation rapid ventricular rate in the office today.  Going to increase her Cardizem to 180 mg daily.  She will continue on her metoprolol 25 mg daily.  She is on Eliquis 2.5 mg twice a day will continue this as well.  Hoping that the increase in her Cardizem will help with her heart rate.  2.  She is pending a TEE on September 15 for her mitral valve regurgitation and planning for mitral clip.   3.  Her weight has been increasing slightly she is up 3 pounds.  I did tell the patient is okay to increase her torsemide to  40 mg daily.  4.  Blood work to be done today which will include BMP, CBC  The patient is in agreement with the above plan. The patient left the office in stable condition.  The patient will follow up in   Medication Adjustments/Labs and Tests Ordered: Current medicines are reviewed at length with the patient today.  Concerns regarding medicines are outlined above.  Orders Placed This Encounter  Procedures  . Basic metabolic panel  . CBC  . Magnesium  . EKG 12-Lead   Meds ordered this encounter  Medications  . diltiazem (CARDIZEM CD) 180 MG 24 hr capsule    Sig: Take 1 capsule (180 mg total) by mouth daily.    Dispense:  90 capsule    Refill:  3  . torsemide (DEMADEX) 20 MG tablet    Sig: Take 2 tablets (40 mg total) by mouth 2 (two) times daily.    Dispense:  360 tablet    Refill:  3    Patient Instructions  Medication Instructions:  Your physician has recommended you make the following change in your medication:  INCREASE: Cardizem 180 mg take one tablet by mouth daily INCREASE: Torsemide 40 mg take two tablets by mouth twice daily.  *If you need a refill on your cardiac medications before your next appointment, please call your pharmacy*   Lab Work: Your physician recommends that you return for lab work in: TODAY BMP, CBC, Mag If you have labs (blood work) drawn today and your tests are completely normal, you will receive your results only by: Marland Kitchen MyChart Message (if you have MyChart) OR . A paper copy in the mail If you have any lab test that is abnormal or we need to change your treatment, we will call you to review the results.   Testing/Procedures: None   Follow-Up: At [facility], you and your health needs are our priority.  As part of our continuing mission to provide you with exceptional heart care, we have created designated Provider Care Teams.  These Care Teams include your primary Cardiologist (physician) and Advanced Practice Providers (APPs -   Physician Assistants and Nurse Practitioners) who all work together to provide you with the  care you need, when you need it.  We recommend signing up for the patient portal called "MyChart".  Sign up information is provided on this After Visit Summary.  MyChart is used to connect with patients for Virtual Visits (Telemedicine).  Patients are able to view lab/test results, encounter notes, upcoming appointments, etc.  Non-urgent messages can be sent to your provider as well.   To learn more about what you can do with MyChart, go to [email or web address].    Your next appointment:   1 month(s)  The format for your next appointment:   In Person  Provider:   Jenne Campus, MD   Other Instructions      Adopting a Healthy Lifestyle.  Know what a healthy weight is for you (roughly BMI <25) and aim to maintain this   Aim for 7+ servings of fruits and vegetables daily   65-80+ fluid ounces of water or unsweet tea for healthy kidneys   Limit to max 1 drink of alcohol per day; avoid smoking/tobacco   Limit animal fats in diet for cholesterol and heart health - choose grass fed whenever available   Avoid highly processed foods, and foods high in saturated/trans fats   Aim for low stress - take time to unwind and care for your mental health   Aim for 150 min of moderate intensity exercise weekly for heart health, and weights twice weekly for bone health   Aim for 7-9 hours of sleep daily   When it comes to diets, agreement about the perfect plan isnt easy to find, even among the experts. Experts at the [employer] developed an idea known as the Healthy Eating Plate. Just imagine a plate divided into logical, healthy portions.   The emphasis is on diet quality:   Load up on vegetables and fruits - one-half of your plate: Aim for color and variety, and remember that potatoes dont count.   Go for whole grains - one-quarter of your plate: Whole wheat, barley,  wheat berries, quinoa, oats, brown rice, and foods made with them. If you want pasta, go with whole wheat pasta.   Protein power - one-quarter of your plate: Fish, chicken, beans, and nuts are all healthy, versatile protein sources. Limit red meat.   The diet, however, does go beyond the plate, offering a few other suggestions.   Use healthy plant oils, such as olive, canola, soy, corn, sunflower and peanut. Check the labels, and avoid partially hydrogenated oil, which have unhealthy trans fats.   If youre thirsty, drink water. Coffee and tea are good in moderation, but skip sugary drinks and limit milk and dairy products to one or two daily servings.   The type of carbohydrate in the diet is more important than the amount. Some sources of carbohydrates, such as vegetables, fruits, whole grains, and beans-are healthier than others.   Finally, stay active  Signed, Berniece Salines, DO  10/26/2019 11:56 AM    [place]

## 2019-10-26 NOTE — H&P (View-Only) (Signed)
Cardiology Office Note:    Date:  10/26/2019   ID:  Lauren Walker, DOB 01/04/1930, MRN [id number]  PCP:  Midge Minium, MD  Cardiologist:  Jenne Campus, MD  Electrophysiologist:  None   Referring MD: Midge Minium, MD   " My heart rate is up and I am tired"  History of Present Illness:    Lauren Walker is a 86 y.o. female with past medical history significant for breast CA only on hormonal therapy, chronic both systolic and diastolic congestive heart failure, status post ICD, remote history of coronary artery disease, paroxysmal atrial fibrillation, moderate to severe mitral regurgitation, chronic kidney failure, essential hypertension, dyslipidemia, hypothyroidism,   She did she Dr Wendy Poet office 09/03with complaints of chest tightness.  She was noted to be atrial fibrillation rapid ventricular rate.  Patient was taken to the [facility] [place] emergency department where she was subsequently transferred to inpatient.  During her inpatient stay her pacemaker interrogation showed atrial fibrillation rapid ventricular rate the patient was cardioverted 200 J x 3.  She was continued on her apixaban.  She did have an echocardiogram with concerned that her mitral regurgitation is the negatives of her recurrent atrial fibrillation rapid ventricular rate.  She was deemed to be a candidate for MitraClip.  And has been scheduled for a follow-up TEE.  She is here today for follow-up visit.  Past Medical History:  Diagnosis Date  . Acute CHF (congestive heart failure) ([place]) 05/25/2019  . Anemia associated with stage 4 chronic renal failure ([place]) 05/17/2019  . Aortic stenosis 02/14/2019  . Arthritis   . CAD (coronary artery disease) 08/16/2018  . Chronic kidney disease   . Chronic systolic dysfunction of left ventricle   . CKD stage G4/A1, GFR 15-29 and albumin creatinine ratio <30 mg/g (HCC) 05/17/2019  . Coronary artery disease   . Diabetes mellitus without complication  ([place])   . Diverticulitis   . History of MI (myocardial infarction) 08/16/2018  . HTN (hypertension) 08/16/2018  . Hyperlipidemia associated with type 2 diabetes mellitus ([place]) 08/16/2018  . Hypertension   . Hypothyroid 08/16/2018  . Hypothyroidism   . ICD (implantable cardioverter-defibrillator) in place 07/06/2019  . Malignant neoplasm of upper-outer quadrant of left breast in female, estrogen receptor positive ([place]) 03/02/2019  . Mitral regurgitation moderate to severe based on echocardiogram in summer 2020 11/04/2018  . Myocardial infarction ([place])   . Pacemaker 06/29/2019  . Paroxysmal atrial fibrillation (HCC)   . Pleural effusion on left 06/29/2019  . Sinus bradycardia 06/17/2019  . Thyroid disease   . Transaminitis 06/17/2019  . Type 2 diabetes mellitus with other circulatory complications ([place]) 05/26/8117  . V-tach [facility]) 06/17/2019    Past Surgical History:  Procedure Laterality Date  . APPENDECTOMY  1936  . ICD IMPLANT N/A 06/20/2019   Procedure: ICD IMPLANT;  Surgeon: Evans Lance, MD;  Location: [facility];  Service: Cardiovascular;  Laterality: N/A;  . IR THORACENTESIS ASP PLEURAL SPACE W/IMG GUIDE  05/27/2019  . PARS PLANA VITRECTOMY Right 12/04/2018   Procedure: RETINAL DETACHMENT REPAIR PPV 25 GAUGE WITH ENDO LASER AIR/FLUID EXCHANGE SF6 GAS INJECTION;  Surgeon: Jalene Mullet, MD;  Location: [facility];  Service: Ophthalmology;  Laterality: Right;  . TONSILLECTOMY      Current Medications: Current Meds  Medication Sig  . acetaminophen (TYLENOL) 650 MG CR tablet Take 650 mg by mouth every 8 (eight) hours as needed for pain.  Marland Kitchen anastrozole (ARIMIDEX) 1 MG tablet TAKE  1 TABLET BY MOUTH EVERY DAY  . apixaban (ELIQUIS) 2.5 MG TABS tablet Take 1 tablet (2.5 mg total) by mouth 2 (two) times daily.  Marland Kitchen atorvastatin (LIPITOR) 40 MG tablet TAKE 1 TABLET BY MOUTH EVERY DAY  . Azelastine-Fluticasone 137-50 MCG/ACT SUSP Place 1 spray into both nostrils 2 (two) times daily.   Marland Kitchen b  complex vitamins capsule Take 1 capsule by mouth daily.  . Biotin 1 MG CAPS Take 1 mg by mouth daily.   . Cholecalciferol (VITAMIN D3) 50 MCG (2000 UT) TABS Take 2,000 Units by mouth at bedtime.   . Ferrous Sulfate (IRON) 325 (65 Fe) MG TABS Take 325 mg by mouth daily with breakfast.   . fluticasone (FLONASE) 50 MCG/ACT nasal spray Place 2 sprays into both nostrils daily.  . isosorbide dinitrate (ISORDIL) 20 MG tablet TAKE 2 TABLETS (40MG ) BY MOUTH TWICE DAILY  . levothyroxine (SYNTHROID) 150 MCG tablet TAKE 1 TABLET BY MOUTH DAILY BEFORE BREAKFAST.  . magnesium oxide (MAG-OX) 400 MG tablet Take 400 mg by mouth at bedtime.   . metoprolol tartrate (LOPRESSOR) 25 MG tablet Take 1 tablet (25 mg total) by mouth 2 (two) times daily.  . nitroGLYCERIN (NITROSTAT) 0.4 MG SL tablet Place 1 tablet (0.4 mg total) under the tongue every 5 (five) minutes as needed for chest pain.  . vitamin C (ASCORBIC ACID) 500 MG tablet Take 500 mg by mouth daily.  . [DISCONTINUED] diltiazem (CARDIZEM CD) 120 MG 24 hr capsule Take 1 capsule (120 mg total) by mouth daily.  . [DISCONTINUED] torsemide (DEMADEX) 20 MG tablet Take 1.5 tablets (30 mg total) by mouth daily.     Allergies:   Amiodarone, Aranesp (albumin free) [darbepoetin alfa], and Tape   Social History   Socioeconomic History  . Marital status: Married    Spouse name: Not on file  . Number of children: Not on file  . Years of education: Not on file  . Highest education level: Not on file  Occupational History  . Not on file  Tobacco Use  . Smoking status: Former Research scientist (life sciences)  . Smokeless tobacco: Never Used  Vaping Use  . Vaping Use: Never used  Substance and Sexual Activity  . Alcohol use: Yes    Comment: Very rare  . Drug use: Not Currently  . Sexual activity: Not Currently  Other Topics Concern  . Not on file  Social History Narrative  . Not on file   Social Determinants of Health   Financial Resource Strain:   . Difficulty of Paying Living  Expenses: Not on file  Food Insecurity:   . Worried About Charity fundraiser in the Last Year: Not on file  . Ran Out of Food in the Last Year: Not on file  Transportation Needs:   . Lack of Transportation (Medical): Not on file  . Lack of Transportation (Non-Medical): Not on file  Physical Activity:   . Days of Exercise per Week: Not on file  . Minutes of Exercise per Session: Not on file  Stress:   . Feeling of Stress : Not on file  Social Connections:   . Frequency of Communication with Friends and Family: Not on file  . Frequency of Social Gatherings with Friends and Family: Not on file  . Attends Religious Services: Not on file  . Active Member of Clubs or Organizations: Not on file  . Attends Archivist Meetings: Not on file  . Marital Status: Not on file  Family History: The patient's family history includes Arthritis in her daughter, father, mother, and son; Depression in her maternal aunt; Heart attack in her father; Heart disease in her father and mother; Hyperlipidemia in her maternal aunt; Hypertension in her maternal aunt and mother.  ROS:   Review of Systems  Constitution: Negative for decreased appetite, fever and weight gain.  HENT: Negative for congestion, ear discharge, hoarse voice and sore throat.   Eyes: Negative for discharge, redness, vision loss in right eye and visual halos.  Cardiovascular: Negative for chest pain, dyspnea on exertion, leg swelling, orthopnea and palpitations.  Respiratory: Negative for cough, hemoptysis, shortness of breath and snoring.   Endocrine: Negative for heat intolerance and polyphagia.  Hematologic/Lymphatic: Negative for bleeding problem. Does not bruise/bleed easily.  Skin: Negative for flushing, nail changes, rash and suspicious lesions.  Musculoskeletal: Negative for arthritis, joint pain, muscle cramps, myalgias, neck pain and stiffness.  Gastrointestinal: Negative for abdominal pain, bowel incontinence,  diarrhea and excessive appetite.  Genitourinary: Negative for decreased libido, genital sores and incomplete emptying.  Neurological: Negative for brief paralysis, focal weakness, headaches and loss of balance.  Psychiatric/Behavioral: Negative for altered mental status, depression and suicidal ideas.  Allergic/Immunologic: Negative for HIV exposure and persistent infections.    EKGs/Labs/Other Studies Reviewed:    The following studies were reviewed today:   EKG:  The ekg ordered today demonstrates atrial fibrillation rapid ventricular rate, heart rate 131 bpm.  Echo ECHO: 10/22/2019 1. Severe mitral valve regurgitation. Moderate restriction of posterior  mitral leaflet, with posteriorly directed eccentric jet. Secondary mitral  regurgitation, likely Carpentier type IIIB. Quantitation by PISA suggests moderate regurgitation however there is splaying of color flow Doppler and the dominant jet demonstrates Coanda effect. These finding in combination suggest severe mitral valve regurgitation. The mitral valve is grossly normal. Severe mitral valve regurgitation.  2. Left ventricular ejection fraction, by estimation, is 45 to 50%. The left ventricle has mildly decreased function. The left ventricle demonstrates regional wall motion abnormalities (see scoring  diagram/findings for description).  3. Right ventricular systolic function is moderately reduced.  4. Left atrial size was severely dilated.  5. Aortic valve regurgitation is trivial.  6. The inferior vena cava is dilated in size with <50% respiratory variability, suggesting right atrial pressure of 15 mmHg.     Recent Labs: 05/25/2019: TSH 1.130 09/02/2019: NT-Pro BNP 12,348 10/20/2019: ALT 16; B Natriuretic Peptide 516.6 10/21/2019: Magnesium 2.2 10/23/2019: BUN 67; Creatinine, Ser 2.24; Hemoglobin 10.0; Platelets 238; Potassium 4.7; Sodium 136  Recent Lipid Panel    Component Value Date/Time   CHOL 109 12/29/2018 1415   TRIG  154.0 (H) 12/29/2018 1415   HDL 48.40 12/29/2018 1415   CHOLHDL 2 12/29/2018 1415   VLDL 30.8 12/29/2018 1415   LDLCALC 30 12/29/2018 1415    Physical Exam:    VS:  BP 128/78   Pulse (!) 131   Ht 5\' 4"  (1.626 m)   Wt 167 lb 0.6 oz (75.8 kg)   SpO2 97%   BMI 28.67 kg/m     Wt Readings from Last 3 Encounters:  10/26/19 167 lb 0.6 oz (75.8 kg)  10/24/19 165 lb 1.6 oz (74.9 kg)  10/21/19 164 lb (74.4 kg)     GEN: Well nourished, well developed in no acute distress HEENT: Normal NECK: No JVD; No carotid bruits LYMPHATICS: No lymphadenopathy CARDIAC: S1S2 noted,RRR, 3 out of 6 holosystolic murmur murmurs, rubs, gallops RESPIRATORY:  Clear to auscultation without rales, wheezing or rhonchi  ABDOMEN: Soft, non-tender, non-distended, +bowel sounds, no guarding. EXTREMITIES: +1 bilateral leg edema, No cyanosis, no clubbing MUSCULOSKELETAL:  No deformity  SKIN: Warm and dry NEUROLOGIC:  Alert and oriented x 3, non-focal PSYCHIATRIC:  Normal affect, good insight  ASSESSMENT:    1. Atrial fibrillation with RVR ([place])   2. Coronary artery disease involving native coronary artery of native heart without angina pectoris   3. Hyperlipidemia associated with type 2 diabetes mellitus ([place])   4. Essential hypertension   5. Type 2 diabetes mellitus with other circulatory complications (HCC)   6. Nonrheumatic mitral valve regurgitation    PLAN:     1.  She is in atrial fibrillation rapid ventricular rate in the office today.  Going to increase her Cardizem to 180 mg daily.  She will continue on her metoprolol 25 mg daily.  She is on Eliquis 2.5 mg twice a day will continue this as well.  Hoping that the increase in her Cardizem will help with her heart rate.  2.  She is pending a TEE on September 15 for her mitral valve regurgitation and planning for mitral clip.   3.  Her weight has been increasing slightly she is up 3 pounds.  I did tell the patient is okay to increase her torsemide to  40 mg daily.  4.  Blood work to be done today which will include BMP, CBC  The patient is in agreement with the above plan. The patient left the office in stable condition.  The patient will follow up in   Medication Adjustments/Labs and Tests Ordered: Current medicines are reviewed at length with the patient today.  Concerns regarding medicines are outlined above.  Orders Placed This Encounter  Procedures  . Basic metabolic panel  . CBC  . Magnesium  . EKG 12-Lead   Meds ordered this encounter  Medications  . diltiazem (CARDIZEM CD) 180 MG 24 hr capsule    Sig: Take 1 capsule (180 mg total) by mouth daily.    Dispense:  90 capsule    Refill:  3  . torsemide (DEMADEX) 20 MG tablet    Sig: Take 2 tablets (40 mg total) by mouth 2 (two) times daily.    Dispense:  360 tablet    Refill:  3    Patient Instructions  Medication Instructions:  Your physician has recommended you make the following change in your medication:  INCREASE: Cardizem 180 mg take one tablet by mouth daily INCREASE: Torsemide 40 mg take two tablets by mouth twice daily.  *If you need a refill on your cardiac medications before your next appointment, please call your pharmacy*   Lab Work: Your physician recommends that you return for lab work in: TODAY BMP, CBC, Mag If you have labs (blood work) drawn today and your tests are completely normal, you will receive your results only by: Marland Kitchen MyChart Message (if you have MyChart) OR . A paper copy in the mail If you have any lab test that is abnormal or we need to change your treatment, we will call you to review the results.   Testing/Procedures: None   Follow-Up: At [facility], you and your health needs are our priority.  As part of our continuing mission to provide you with exceptional heart care, we have created designated Provider Care Teams.  These Care Teams include your primary Cardiologist (physician) and Advanced Practice Providers (APPs -   Physician Assistants and Nurse Practitioners) who all work together to provide you with the  care you need, when you need it.  We recommend signing up for the patient portal called "MyChart".  Sign up information is provided on this After Visit Summary.  MyChart is used to connect with patients for Virtual Visits (Telemedicine).  Patients are able to view lab/test results, encounter notes, upcoming appointments, etc.  Non-urgent messages can be sent to your provider as well.   To learn more about what you can do with MyChart, go to [email or web address].    Your next appointment:   1 month(s)  The format for your next appointment:   In Person  Provider:   Jenne Campus, MD   Other Instructions      Adopting a Healthy Lifestyle.  Know what a healthy weight is for you (roughly BMI <25) and aim to maintain this   Aim for 7+ servings of fruits and vegetables daily   65-80+ fluid ounces of water or unsweet tea for healthy kidneys   Limit to max 1 drink of alcohol per day; avoid smoking/tobacco   Limit animal fats in diet for cholesterol and heart health - choose grass fed whenever available   Avoid highly processed foods, and foods high in saturated/trans fats   Aim for low stress - take time to unwind and care for your mental health   Aim for 150 min of moderate intensity exercise weekly for heart health, and weights twice weekly for bone health   Aim for 7-9 hours of sleep daily   When it comes to diets, agreement about the perfect plan isnt easy to find, even among the experts. Experts at the [employer] developed an idea known as the Healthy Eating Plate. Just imagine a plate divided into logical, healthy portions.   The emphasis is on diet quality:   Load up on vegetables and fruits - one-half of your plate: Aim for color and variety, and remember that potatoes dont count.   Go for whole grains - one-quarter of your plate: Whole wheat, barley,  wheat berries, quinoa, oats, brown rice, and foods made with them. If you want pasta, go with whole wheat pasta.   Protein power - one-quarter of your plate: Fish, chicken, beans, and nuts are all healthy, versatile protein sources. Limit red meat.   The diet, however, does go beyond the plate, offering a few other suggestions.   Use healthy plant oils, such as olive, canola, soy, corn, sunflower and peanut. Check the labels, and avoid partially hydrogenated oil, which have unhealthy trans fats.   If youre thirsty, drink water. Coffee and tea are good in moderation, but skip sugary drinks and limit milk and dairy products to one or two daily servings.   The type of carbohydrate in the diet is more important than the amount. Some sources of carbohydrates, such as vegetables, fruits, whole grains, and beans-are healthier than others.   Finally, stay active  Signed, Berniece Salines, DO  10/26/2019 11:56 AM    [place]

## 2019-10-27 LAB — CBC
Hematocrit: 29.7 % — ABNORMAL LOW (ref 34.0–46.6)
Hemoglobin: 9.6 g/dL — ABNORMAL LOW (ref 11.1–15.9)
MCH: 29.1 pg (ref 26.6–33.0)
MCHC: 32.3 g/dL (ref 31.5–35.7)
MCV: 90 fL (ref 79–97)
Platelets: 351 10*3/uL (ref 150–450)
RBC: 3.3 x10E6/uL — ABNORMAL LOW (ref 3.77–5.28)
RDW: 12.4 % (ref 11.7–15.4)
WBC: 11.7 10*3/uL — ABNORMAL HIGH (ref 3.4–10.8)

## 2019-10-27 LAB — BASIC METABOLIC PANEL
BUN/Creatinine Ratio: 31 — ABNORMAL HIGH (ref 12–28)
BUN: 63 mg/dL — ABNORMAL HIGH (ref 8–27)
CO2: 20 mmol/L (ref 20–29)
Calcium: 9.1 mg/dL (ref 8.7–10.3)
Chloride: 101 mmol/L (ref 96–106)
Creatinine, Ser: 2.02 mg/dL — ABNORMAL HIGH (ref 0.57–1.00)
GFR calc Af Amer: 25 mL/min/{1.73_m2} — ABNORMAL LOW (ref >59)
GFR calc non Af Amer: 22 mL/min/{1.73_m2} — ABNORMAL LOW (ref >59)
Glucose: 244 mg/dL — ABNORMAL HIGH (ref 65–99)
Potassium: 4.9 mmol/L (ref 3.5–5.2)
Sodium: 136 mmol/L (ref 134–144)

## 2019-10-27 LAB — MAGNESIUM: Magnesium: 2.2 mg/dL (ref 1.6–2.3)

## 2019-10-28 ENCOUNTER — Telehealth: Payer: Self-pay | Admitting: Emergency Medicine

## 2019-10-28 NOTE — Telephone Encounter (Signed)
Patient alert received for ongoing AF event . Known PAF, + Eliquis. Was seen by Dr Harriet Masson 10/26/19 and diltiazem increased to 180 mg qd, and torosemide increased to 40 mg QD. Confirmed patient increased medication after 10/26/19 visit and that she is taking lopressor 25 mg BID, + Eliquis. She reports feeling a " little" better this afternoon. She reports that she still has increased tiredness and SOB with activity. Reports no CP, SOB, chest pressure dizziness or syncope. Patient given ED precautions for declining cardiac condition and will send manual remote transmission on 11/03/19 to assess AF. Device clinic # and clinic hours provided t assist with remote transmission.

## 2019-10-31 ENCOUNTER — Other Ambulatory Visit (HOSPITAL_COMMUNITY)
Admission: RE | Admit: 2019-10-31 | Discharge: 2019-10-31 | Disposition: A | Discharge status: Home / Self Care | Payer: Medicare Other | Source: Ambulatory Visit | Attending: Internal Medicine | Admitting: Internal Medicine

## 2019-10-31 ENCOUNTER — Telehealth: Payer: Self-pay | Admitting: Cardiology

## 2019-10-31 ENCOUNTER — Encounter: Payer: Self-pay | Admitting: Cardiology

## 2019-10-31 DIAGNOSIS — Z01812 Encounter for preprocedural laboratory examination: Secondary | ICD-10-CM | POA: Insufficient documentation

## 2019-10-31 DIAGNOSIS — I4819 Other persistent atrial fibrillation: Secondary | ICD-10-CM | POA: Diagnosis not present

## 2019-10-31 DIAGNOSIS — Z20822 Contact with and (suspected) exposure to covid-19: Secondary | ICD-10-CM | POA: Insufficient documentation

## 2019-10-31 DIAGNOSIS — I4891 Unspecified atrial fibrillation: Secondary | ICD-10-CM | POA: Diagnosis not present

## 2019-10-31 LAB — SARS CORONAVIRUS 2 (TAT 6-24 HRS): SARS Coronavirus 2: NEGATIVE

## 2019-10-31 NOTE — Telephone Encounter (Signed)
Pt c/o Shortness Of Breath: STAT if SOB developed within the last 24 hours or pt is noticeably SOB on the phone  1. Are you currently SOB (can you hear that pt is SOB on the phone)?yes   2. How long have you been experiencing SOB? Thursday last week  3. Are you SOB when sitting or when up moving around? Yes   4. Are you currently experiencing any other symptoms? Can not sleep weak.

## 2019-10-31 NOTE — Telephone Encounter (Signed)
Called patient back her daughter is actually able to take her now. She will call back if something changes.

## 2019-10-31 NOTE — Telephone Encounter (Signed)
Patient is calling to follow up regarding IV Lasix and labs. She states she will not be able to make it today because she does not have transportation.

## 2019-10-31 NOTE — Telephone Encounter (Signed)
Called patient. She reports since last Thursday she has been short of breath. Yesterday she gained 7 lbs in one day. Swelling in legs is present but she is unsure if gotten worse due to wearing compression stockings. She is currently taking 40 mg of demadex twice daily. She has lost 1 lb today. Will consult with Dr. Geraldo Pitter for next steps.

## 2019-10-31 NOTE — Telephone Encounter (Signed)
Called patient and [facility]. Got her set up to have IV lasix and labs. No further questions.

## 2019-10-31 NOTE — Telephone Encounter (Signed)
Please set her up at [facility] to get 40 mg of IV Lasix today and tomorrow as outpatient..  On these days she will not take her oral diuretic.  She will subsequently resume them on Wednesday.  Chem-7 today after her Lasix.

## 2019-11-02 ENCOUNTER — Ambulatory Visit (HOSPITAL_COMMUNITY)
Admission: RE | Admit: 2019-11-02 | Discharge: 2019-11-02 | Disposition: A | Discharge status: Home / Self Care | Payer: Medicare Other | Source: Home / Self Care | Attending: Internal Medicine | Admitting: Internal Medicine

## 2019-11-02 ENCOUNTER — Other Ambulatory Visit: Payer: Self-pay

## 2019-11-02 ENCOUNTER — Ambulatory Visit (HOSPITAL_BASED_OUTPATIENT_CLINIC_OR_DEPARTMENT_OTHER)
Admission: RE | Admit: 2019-11-02 | Discharge: 2019-11-02 | Disposition: A | Discharge status: Home / Self Care | Payer: Medicare Other | Source: Ambulatory Visit | Attending: Physician Assistant | Admitting: Physician Assistant

## 2019-11-02 ENCOUNTER — Encounter (HOSPITAL_COMMUNITY): Payer: Self-pay | Admitting: Internal Medicine

## 2019-11-02 ENCOUNTER — Ambulatory Visit (HOSPITAL_COMMUNITY): Payer: Medicare Other | Admitting: Anesthesiology

## 2019-11-02 ENCOUNTER — Encounter (HOSPITAL_COMMUNITY)
Admission: RE | Disposition: A | Discharge status: Home / Self Care | Payer: Self-pay | Source: Home / Self Care | Attending: Internal Medicine

## 2019-11-02 DIAGNOSIS — E039 Hypothyroidism, unspecified: Secondary | ICD-10-CM | POA: Insufficient documentation

## 2019-11-02 DIAGNOSIS — Z7901 Long term (current) use of anticoagulants: Secondary | ICD-10-CM | POA: Insufficient documentation

## 2019-11-02 DIAGNOSIS — I340 Nonrheumatic mitral (valve) insufficiency: Secondary | ICD-10-CM | POA: Insufficient documentation

## 2019-11-02 DIAGNOSIS — E1122 Type 2 diabetes mellitus with diabetic chronic kidney disease: Secondary | ICD-10-CM | POA: Insufficient documentation

## 2019-11-02 DIAGNOSIS — N189 Chronic kidney disease, unspecified: Secondary | ICD-10-CM | POA: Insufficient documentation

## 2019-11-02 DIAGNOSIS — I252 Old myocardial infarction: Secondary | ICD-10-CM | POA: Insufficient documentation

## 2019-11-02 DIAGNOSIS — I313 Pericardial effusion (noninflammatory): Secondary | ICD-10-CM | POA: Diagnosis not present

## 2019-11-02 DIAGNOSIS — I130 Hypertensive heart and chronic kidney disease with heart failure and stage 1 through stage 4 chronic kidney disease, or unspecified chronic kidney disease: Secondary | ICD-10-CM | POA: Insufficient documentation

## 2019-11-02 DIAGNOSIS — Z170 Estrogen receptor positive status [ER+]: Secondary | ICD-10-CM | POA: Insufficient documentation

## 2019-11-02 DIAGNOSIS — I509 Heart failure, unspecified: Secondary | ICD-10-CM | POA: Insufficient documentation

## 2019-11-02 DIAGNOSIS — I361 Nonrheumatic tricuspid (valve) insufficiency: Secondary | ICD-10-CM | POA: Diagnosis not present

## 2019-11-02 DIAGNOSIS — Z87891 Personal history of nicotine dependence: Secondary | ICD-10-CM | POA: Insufficient documentation

## 2019-11-02 DIAGNOSIS — Z79890 Hormone replacement therapy (postmenopausal): Secondary | ICD-10-CM | POA: Insufficient documentation

## 2019-11-02 DIAGNOSIS — Z95810 Presence of automatic (implantable) cardiac defibrillator: Secondary | ICD-10-CM | POA: Insufficient documentation

## 2019-11-02 DIAGNOSIS — D631 Anemia in chronic kidney disease: Secondary | ICD-10-CM | POA: Insufficient documentation

## 2019-11-02 DIAGNOSIS — I480 Paroxysmal atrial fibrillation: Secondary | ICD-10-CM | POA: Insufficient documentation

## 2019-11-02 DIAGNOSIS — Z79899 Other long term (current) drug therapy: Secondary | ICD-10-CM | POA: Insufficient documentation

## 2019-11-02 DIAGNOSIS — E785 Hyperlipidemia, unspecified: Secondary | ICD-10-CM | POA: Insufficient documentation

## 2019-11-02 DIAGNOSIS — I2510 Atherosclerotic heart disease of native coronary artery without angina pectoris: Secondary | ICD-10-CM | POA: Insufficient documentation

## 2019-11-02 DIAGNOSIS — Z853 Personal history of malignant neoplasm of breast: Secondary | ICD-10-CM | POA: Insufficient documentation

## 2019-11-02 HISTORY — PX: TEE WITHOUT CARDIOVERSION: SHX5443

## 2019-11-02 HISTORY — PX: BUBBLE STUDY: SHX6837

## 2019-11-02 SURGERY — ECHOCARDIOGRAM, TRANSESOPHAGEAL
Anesthesia: Monitor Anesthesia Care

## 2019-11-02 MED ORDER — EPHEDRINE SULFATE 50 MG/ML IJ SOLN
INTRAMUSCULAR | Status: DC | PRN
  Administered 2019-11-02: 5 mg via INTRAVENOUS

## 2019-11-02 MED ORDER — PROPOFOL 10 MG/ML IV BOLUS
INTRAVENOUS | Status: DC | PRN
  Administered 2019-11-02: 30 mg via INTRAVENOUS

## 2019-11-02 MED ORDER — BUTAMBEN-TETRACAINE-BENZOCAINE 2-2-14 % EX AERO
INHALATION_SPRAY | CUTANEOUS | Status: DC | PRN
  Administered 2019-11-02: 2 via TOPICAL

## 2019-11-02 MED ORDER — LIDOCAINE HCL (CARDIAC) PF 100 MG/5ML IV SOSY
PREFILLED_SYRINGE | INTRAVENOUS | Status: DC | PRN
  Administered 2019-11-02: 100 mg via INTRAVENOUS

## 2019-11-02 MED ORDER — PHENYLEPHRINE HCL (PRESSORS) 10 MG/ML IV SOLN
INTRAVENOUS | Status: DC | PRN
  Administered 2019-11-02 (×3): 80 ug via INTRAVENOUS

## 2019-11-02 MED ORDER — PROPOFOL 500 MG/50ML IV EMUL
INTRAVENOUS | Status: DC | PRN
  Administered 2019-11-02: 75 ug/kg/min via INTRAVENOUS

## 2019-11-02 MED ORDER — PHENYLEPHRINE HCL-NACL 10-0.9 MG/250ML-% IV SOLN
INTRAVENOUS | Status: DC | PRN
  Administered 2019-11-02: 80 ug/min via INTRAVENOUS

## 2019-11-02 MED ORDER — SODIUM CHLORIDE 0.9 % IV SOLN: INTRAVENOUS | Status: DC

## 2019-11-02 NOTE — CV Procedure (Signed)
INDICATIONS: Mitral regurgitation - MitraClip workup  PROCEDURE:   Informed consent was obtained prior to the procedure. The risks, benefits and alternatives for the procedure were discussed and the patient comprehended these risks.  Risks include, but are not limited to, cough, sore throat, vomiting, nausea, somnolence, esophageal and stomach trauma or perforation, bleeding, low blood pressure, aspiration, pneumonia, infection, trauma to the teeth and death.    After a procedural time-out, the oropharynx was anesthetized with 20% benzocaine spray.   During this procedure the patient was administered propofol per anesthesia.  The patient's heart rate, blood pressure, and oxygen saturation were monitored continuously during the procedure. The period of conscious sedation was 45 minutes, of which I was present face-to-face 100% of this time.  The transesophageal probe was inserted in the esophagus and stomach without difficulty and multiple views were obtained.  The patient was kept under observation until the patient left the procedure room.  The patient left the procedure room in stable condition.   Agitated microbubble saline contrast was administered.  COMPLICATIONS:    There were no immediate complications.  FINDINGS:   FORMAL ECHOCARDIOGRAM REPORT PENDING  Severe mitral valve regurgitation  RECOMMENDATIONS:     Dismissal home when stable.  Time Spent Directly with the Patient:  60 minutes   [name] 11/02/2019, 2:36 PM

## 2019-11-02 NOTE — Discharge Instructions (Signed)
Transesophageal Echocardiogram Transesophageal echocardiogram (TEE) is a test that uses sound waves to take pictures of your heart. TEE is done by passing a flexible tube down the esophagus. The esophagus is the tube that carries food from the throat to the stomach. The pictures give detailed images of your heart. This can help your doctor see if there are problems with your heart. What happens before the procedure? Staying hydrated Follow instructions from your doctor about hydration, which may include:  Up to 3 hours before the procedure - you may continue to drink clear liquids, such as: ? Water. ? Clear fruit juice. ? Black coffee. ? Plain tea.  Eating and drinking Follow instructions from your doctor about eating and drinking, which may include:  8 hours before the procedure - stop eating heavy meals or foods such as meat, fried foods, or fatty foods.  6 hours before the procedure - stop eating light meals or foods, such as toast or cereal.  6 hours before the procedure - stop drinking milk or drinks that contain milk.  3 hours before the procedure - stop drinking clear liquids. General instructions  You will need to take out any dentures or retainers.  Plan to have someone take you home from the hospital or clinic.  If you will be going home right after the procedure, plan to have someone with you for 24 hours.  Ask your doctor about: ? Changing or stopping your normal medicines. This is important if you take diabetes medicines or blood thinners. ? Taking over-the-counter medicines, vitamins, herbs, and supplements. ? Taking medicines such as aspirin and ibuprofen. These medicines can thin your blood. Do not take these medicines unless your doctor tells you to take them. What happens during the procedure?  To lower your risk of infection, your doctors will wash or clean their hands.  An IV will be put into one of your veins.  You will be given a medicine to help you  relax (sedative).  A medicine may be sprayed or gargled. This numbs the back of your throat.  Your blood pressure, heart rate, and breathing will be watched.  You may be asked to lay on your left side.  A bite block will be placed in your mouth. This keeps you from biting the tube.  The tip of the TEE probe will be placed into the back of your mouth.  You will be asked to swallow.  Your doctor will take pictures of your heart.  The probe and bite block will be taken out. The procedure may vary among doctors and hospitals. What happens after the procedure?   Your blood pressure, heart rate, breathing rate, and blood oxygen level will be watched until the medicines you were given have worn off.  When you first wake up, your throat may feel sore and numb. This will get better over time. You will not be allowed to eat or drink until the numbness has gone away.  Do not drive for 24 hours if you were given a medicine to help you relax. Summary  TEE is a test that uses sound waves to take pictures of your heart.  You will be given a medicine to help you relax.  Do not drive for 24 hours if you were given a medicine to help you relax. This information is not intended to replace advice given to you by your health care provider. Make sure you discuss any questions you have with your health care provider. Document Revised:   10/23/2017 Document Reviewed: 05/07/2016 Elsevier Patient Education  2020 [employer]  

## 2019-11-02 NOTE — Interval H&P Note (Signed)
History and Physical Interval Note:  11/02/2019 1:39 PM  Lauren Walker  has presented today for surgery, with the diagnosis of SEVER MITRO RECURGITATION.  The various methods of treatment have been discussed with the patient and family. After consideration of risks, benefits and other options for treatment, the patient has consented to  Procedure(s): TRANSESOPHAGEAL ECHOCARDIOGRAM (TEE) (N/A) as a surgical intervention.  The patient's history has been reviewed, patient examined, no change in status, stable for surgery.  I have reviewed the patient's chart and labs.  Questions were answered to the patient's satisfaction.     [name]

## 2019-11-02 NOTE — Telephone Encounter (Signed)
Agree. No additional rec's

## 2019-11-02 NOTE — Progress Notes (Signed)
  Echocardiogram Echocardiogram Transesophageal has been performed.  Jennette Dubin 11/02/2019, 2:24 PM

## 2019-11-02 NOTE — Transfer of Care (Signed)
Immediate Anesthesia Transfer of Care Note  Patient: Lauren Walker  Procedure(s) Performed: TRANSESOPHAGEAL ECHOCARDIOGRAM (TEE) (N/A ) BUBBLE STUDY  Patient Location: PACU and Endoscopy Unit  Anesthesia Type:MAC  Level of Consciousness: awake, alert  and oriented  Airway & Oxygen Therapy: Patient Spontanous Breathing and Patient connected to nasal cannula oxygen  Post-op Assessment: Report given to RN, Post -op Vital signs reviewed and stable and Patient moving all extremities  Post vital signs: Reviewed and stable  Last Vitals:  Vitals Value Taken Time  BP 95/59 11/02/19 1422  Temp    Pulse 105 11/02/19 1429  Resp 26 11/02/19 1429  SpO2 93 % 11/02/19 1429  Vitals shown include unvalidated device data.  Last Pain:  Vitals:   11/02/19 1422  TempSrc: Oral  PainSc: 0-No pain         Complications: No complications documented.

## 2019-11-02 NOTE — Anesthesia Preprocedure Evaluation (Addendum)
Anesthesia Evaluation  Patient identified by MRN, date of birth, ID band Patient awake    Reviewed: Allergy & Precautions, NPO status , Patient's Chart, lab work & pertinent test results, reviewed documented beta blocker date and time   Airway Mallampati: II  TM Distance: >3 FB Neck ROM: Full    Dental no notable dental hx. (+) Caps   Pulmonary former smoker,    Pulmonary exam normal        Cardiovascular hypertension, Pt. on medications + CAD, + Past MI and +CHF  Normal cardiovascular exam+ dysrhythmias Atrial Fibrillation + pacemaker  Rhythm:Irregular Rate:Abnormal  Echo 10/22/19 1. Severe mitral valve regurgitation. Moderate restriction of posterior mitral leaflet, with posteriorly directed eccentric jet. Secondary mitral regurgitation, likely Carpentier type IIIB. Quantitation by PISA suggests moderate regurgitation however there is splaying of color flow Doppler and the dominant jet demonstrates Coanda effect. These finding in combination suggest severe mitral valve regurgitation. The mitral valve is grossly normal. Severe mitral valve  regurgitation.  2. Left ventricular ejection fraction, by estimation, is 45 to 50%. The left ventricle has mildly decreased function. The left ventricle demonstrates regional wall motion abnormalities (see scoring diagram/findings for description).  3. Right ventricular systolic function is moderately reduced.  4. Left atrial size was severely dilated.  5. Aortic valve regurgitation is trivial.  6. The inferior vena cava is dilated in size with <50% respiratory variability, suggesting right atrial pressure of 15 mmHg.   EKG 10/26/19 Atrial fibrillation with RVR, LAD, non specific ST-Twave changes  EP 06/20/19 1. Ischemic cardiomyopathy with chronic New York Heart Association class II heart failure and sustained VT with syncope.   2. Successful ICD implantation.   3. No early apparent  complications.     Neuro/Psych negative neurological ROS  negative psych ROS   GI/Hepatic negative GI ROS, Neg liver ROS,   Endo/Other  diabetes, Poorly Controlled, Type 2Hypothyroidism   Renal/GU Renal InsufficiencyRenal disease  negative genitourinary   Musculoskeletal  (+) Arthritis , Osteoarthritis,    Abdominal   Peds  Hematology  (+) anemia , Eliquis therapy- last dose 11/02/19    Anesthesia Other Findings   Reproductive/Obstetrics                            Anesthesia Physical Anesthesia Plan  ASA: III  Anesthesia Plan: MAC   Post-op Pain Management:    Induction:   PONV Risk Score and Plan: 2 and Treatment may vary due to age or medical condition, Propofol infusion and Ondansetron  Airway Management Planned: Nasal Cannula and Natural Airway  Additional Equipment:   Intra-op Plan:   Post-operative Plan:   Informed Consent: I have reviewed the patients History and Physical, chart, labs and discussed the procedure including the risks, benefits and alternatives for the proposed anesthesia with the patient or authorized representative who has indicated his/her understanding and acceptance.     Dental advisory given  Plan Discussed with: CRNA and Anesthesiologist  Anesthesia Plan Comments:         Anesthesia Quick Evaluation

## 2019-11-02 NOTE — Anesthesia Postprocedure Evaluation (Signed)
Anesthesia Post Note  Patient: Lauren Walker  Procedure(s) Performed: TRANSESOPHAGEAL ECHOCARDIOGRAM (TEE) (N/A ) BUBBLE STUDY     Patient location during evaluation: PACU Anesthesia Type: MAC Level of consciousness: awake and alert Pain management: pain level controlled Vital Signs Assessment: post-procedure vital signs reviewed and stable Respiratory status: spontaneous breathing, nonlabored ventilation, respiratory function stable and patient connected to nasal cannula oxygen Cardiovascular status: stable and blood pressure returned to baseline Postop Assessment: no apparent nausea or vomiting Anesthetic complications: no   No complications documented.  Last Vitals:  Vitals:   11/02/19 1433 11/02/19 1442  BP: 121/89 (!) 148/97  Pulse: (!) 136 68  Resp: (!) 22 (!) 22  Temp:    SpO2: 95% 96%    Last Pain:  Vitals:   11/02/19 1442  TempSrc:   PainSc: 0-No pain                 Belenda Cruise P Ashling Roane

## 2019-11-03 ENCOUNTER — Emergency Department (HOSPITAL_BASED_OUTPATIENT_CLINIC_OR_DEPARTMENT_OTHER): Payer: Medicare Other

## 2019-11-03 ENCOUNTER — Encounter (HOSPITAL_COMMUNITY): Payer: Self-pay | Admitting: Internal Medicine

## 2019-11-03 ENCOUNTER — Ambulatory Visit (INDEPENDENT_AMBULATORY_CARE_PROVIDER_SITE_OTHER): Payer: Medicare Other | Admitting: *Deleted

## 2019-11-03 ENCOUNTER — Inpatient Hospital Stay (HOSPITAL_BASED_OUTPATIENT_CLINIC_OR_DEPARTMENT_OTHER)
Admission: EM | Admit: 2019-11-03 | Discharge: 2019-11-10 | DRG: 308 | Disposition: A | Discharge status: Home / Self Care | Payer: Medicare Other | Attending: Cardiovascular Disease | Admitting: Cardiovascular Disease

## 2019-11-03 DIAGNOSIS — I248 Other forms of acute ischemic heart disease: Secondary | ICD-10-CM | POA: Diagnosis present

## 2019-11-03 DIAGNOSIS — I4819 Other persistent atrial fibrillation: Secondary | ICD-10-CM | POA: Diagnosis present

## 2019-11-03 DIAGNOSIS — N184 Chronic kidney disease, stage 4 (severe): Secondary | ICD-10-CM | POA: Diagnosis present

## 2019-11-03 DIAGNOSIS — Z91048 Other nonmedicinal substance allergy status: Secondary | ICD-10-CM

## 2019-11-03 DIAGNOSIS — I472 Ventricular tachycardia, unspecified: Secondary | ICD-10-CM

## 2019-11-03 DIAGNOSIS — Z7951 Long term (current) use of inhaled steroids: Secondary | ICD-10-CM

## 2019-11-03 DIAGNOSIS — Z20822 Contact with and (suspected) exposure to covid-19: Secondary | ICD-10-CM | POA: Diagnosis present

## 2019-11-03 DIAGNOSIS — Z23 Encounter for immunization: Secondary | ICD-10-CM | POA: Diagnosis not present

## 2019-11-03 DIAGNOSIS — I5042 Chronic combined systolic (congestive) and diastolic (congestive) heart failure: Secondary | ICD-10-CM | POA: Diagnosis not present

## 2019-11-03 DIAGNOSIS — Z888 Allergy status to other drugs, medicaments and biological substances status: Secondary | ICD-10-CM

## 2019-11-03 DIAGNOSIS — F419 Anxiety disorder, unspecified: Secondary | ICD-10-CM | POA: Diagnosis present

## 2019-11-03 DIAGNOSIS — D631 Anemia in chronic kidney disease: Secondary | ICD-10-CM | POA: Diagnosis present

## 2019-11-03 DIAGNOSIS — J90 Pleural effusion, not elsewhere classified: Secondary | ICD-10-CM

## 2019-11-03 DIAGNOSIS — I2582 Chronic total occlusion of coronary artery: Secondary | ICD-10-CM | POA: Diagnosis present

## 2019-11-03 DIAGNOSIS — I313 Pericardial effusion (noninflammatory): Secondary | ICD-10-CM | POA: Diagnosis present

## 2019-11-03 DIAGNOSIS — I130 Hypertensive heart and chronic kidney disease with heart failure and stage 1 through stage 4 chronic kidney disease, or unspecified chronic kidney disease: Secondary | ICD-10-CM | POA: Diagnosis present

## 2019-11-03 DIAGNOSIS — E876 Hypokalemia: Secondary | ICD-10-CM | POA: Diagnosis not present

## 2019-11-03 DIAGNOSIS — E1122 Type 2 diabetes mellitus with diabetic chronic kidney disease: Secondary | ICD-10-CM | POA: Diagnosis present

## 2019-11-03 DIAGNOSIS — I083 Combined rheumatic disorders of mitral, aortic and tricuspid valves: Secondary | ICD-10-CM | POA: Diagnosis present

## 2019-11-03 DIAGNOSIS — Z87891 Personal history of nicotine dependence: Secondary | ICD-10-CM

## 2019-11-03 DIAGNOSIS — D649 Anemia, unspecified: Secondary | ICD-10-CM

## 2019-11-03 DIAGNOSIS — I4891 Unspecified atrial fibrillation: Secondary | ICD-10-CM | POA: Diagnosis present

## 2019-11-03 DIAGNOSIS — Z91040 Latex allergy status: Secondary | ICD-10-CM

## 2019-11-03 DIAGNOSIS — I252 Old myocardial infarction: Secondary | ICD-10-CM

## 2019-11-03 DIAGNOSIS — F411 Generalized anxiety disorder: Secondary | ICD-10-CM

## 2019-11-03 DIAGNOSIS — E785 Hyperlipidemia, unspecified: Secondary | ICD-10-CM | POA: Diagnosis present

## 2019-11-03 DIAGNOSIS — E039 Hypothyroidism, unspecified: Secondary | ICD-10-CM | POA: Diagnosis present

## 2019-11-03 DIAGNOSIS — I3139 Other pericardial effusion (noninflammatory): Secondary | ICD-10-CM

## 2019-11-03 DIAGNOSIS — Z95810 Presence of automatic (implantable) cardiac defibrillator: Secondary | ICD-10-CM | POA: Diagnosis not present

## 2019-11-03 DIAGNOSIS — Z9889 Other specified postprocedural states: Secondary | ICD-10-CM

## 2019-11-03 DIAGNOSIS — Z8249 Family history of ischemic heart disease and other diseases of the circulatory system: Secondary | ICD-10-CM | POA: Diagnosis not present

## 2019-11-03 DIAGNOSIS — Z170 Estrogen receptor positive status [ER+]: Secondary | ICD-10-CM | POA: Diagnosis not present

## 2019-11-03 DIAGNOSIS — I2510 Atherosclerotic heart disease of native coronary artery without angina pectoris: Secondary | ICD-10-CM | POA: Diagnosis present

## 2019-11-03 DIAGNOSIS — I10 Essential (primary) hypertension: Secondary | ICD-10-CM | POA: Diagnosis not present

## 2019-11-03 DIAGNOSIS — R0602 Shortness of breath: Secondary | ICD-10-CM

## 2019-11-03 DIAGNOSIS — N179 Acute kidney failure, unspecified: Secondary | ICD-10-CM | POA: Diagnosis present

## 2019-11-03 DIAGNOSIS — R0609 Other forms of dyspnea: Secondary | ICD-10-CM

## 2019-11-03 DIAGNOSIS — E1169 Type 2 diabetes mellitus with other specified complication: Secondary | ICD-10-CM | POA: Diagnosis present

## 2019-11-03 DIAGNOSIS — C50412 Malignant neoplasm of upper-outer quadrant of left female breast: Secondary | ICD-10-CM | POA: Diagnosis present

## 2019-11-03 DIAGNOSIS — Z79899 Other long term (current) drug therapy: Secondary | ICD-10-CM

## 2019-11-03 DIAGNOSIS — I5043 Acute on chronic combined systolic (congestive) and diastolic (congestive) heart failure: Secondary | ICD-10-CM | POA: Diagnosis present

## 2019-11-03 DIAGNOSIS — Z7901 Long term (current) use of anticoagulants: Secondary | ICD-10-CM

## 2019-11-03 DIAGNOSIS — I340 Nonrheumatic mitral (valve) insufficiency: Secondary | ICD-10-CM | POA: Diagnosis not present

## 2019-11-03 DIAGNOSIS — Z79890 Hormone replacement therapy (postmenopausal): Secondary | ICD-10-CM

## 2019-11-03 DIAGNOSIS — I480 Paroxysmal atrial fibrillation: Secondary | ICD-10-CM | POA: Diagnosis not present

## 2019-11-03 LAB — CUP PACEART REMOTE DEVICE CHECK
Battery Remaining Longevity: 86 mo
Battery Remaining Percentage: 93 %
Battery Voltage: 2.99 V
Brady Statistic AP VP Percent: 2 %
Brady Statistic AP VS Percent: 92 %
Brady Statistic AS VP Percent: 1 %
Brady Statistic AS VS Percent: 2.7 %
Brady Statistic RA Percent Paced: 69 %
Brady Statistic RV Percent Paced: 4.2 %
Date Time Interrogation Session: 20210916093049
HighPow Impedance: 68 Ohm
Implantable Lead Implant Date: 20210503
Implantable Lead Implant Date: 20210503
Implantable Lead Location: 753859
Implantable Lead Location: 753860
Implantable Pulse Generator Implant Date: 20210503
Lead Channel Impedance Value: 410 Ohm
Lead Channel Impedance Value: 460 Ohm
Lead Channel Pacing Threshold Amplitude: 0.75 V
Lead Channel Pacing Threshold Amplitude: 0.75 V
Lead Channel Pacing Threshold Pulse Width: 0.5 ms
Lead Channel Pacing Threshold Pulse Width: 0.5 ms
Lead Channel Sensing Intrinsic Amplitude: 0.7 mV
Lead Channel Sensing Intrinsic Amplitude: 12 mV
Lead Channel Setting Pacing Amplitude: 1.75 V
Lead Channel Setting Pacing Amplitude: 2.5 V
Lead Channel Setting Pacing Pulse Width: 0.5 ms
Lead Channel Setting Sensing Sensitivity: 0.5 mV
Pulse Gen Serial Number: 111019603

## 2019-11-03 LAB — SARS CORONAVIRUS 2 BY RT PCR (HOSPITAL ORDER, PERFORMED IN ~~LOC~~ HOSPITAL LAB): SARS Coronavirus 2: NEGATIVE

## 2019-11-03 LAB — GLUCOSE, CAPILLARY
Glucose-Capillary: 224 mg/dL — ABNORMAL HIGH (ref 70–99)
Glucose-Capillary: 235 mg/dL — ABNORMAL HIGH (ref 70–99)
Glucose-Capillary: 244 mg/dL — ABNORMAL HIGH (ref 70–99)

## 2019-11-03 LAB — COMPREHENSIVE METABOLIC PANEL
ALT: 16 U/L (ref 0–44)
AST: 16 U/L (ref 15–41)
Albumin: 3.2 g/dL — ABNORMAL LOW (ref 3.5–5.0)
Alkaline Phosphatase: 58 U/L (ref 38–126)
Anion gap: 13 (ref 5–15)
BUN: 84 mg/dL — ABNORMAL HIGH (ref 8–23)
CO2: 24 mmol/L (ref 22–32)
Calcium: 8.6 mg/dL — ABNORMAL LOW (ref 8.9–10.3)
Chloride: 96 mmol/L — ABNORMAL LOW (ref 98–111)
Creatinine, Ser: 2.5 mg/dL — ABNORMAL HIGH (ref 0.44–1.00)
GFR calc Af Amer: 19 mL/min — ABNORMAL LOW (ref >60)
GFR calc non Af Amer: 17 mL/min — ABNORMAL LOW (ref >60)
Glucose, Bld: 302 mg/dL — ABNORMAL HIGH (ref 70–99)
Potassium: 4.3 mmol/L (ref 3.5–5.1)
Sodium: 133 mmol/L — ABNORMAL LOW (ref 135–145)
Total Bilirubin: 0.5 mg/dL (ref 0.3–1.2)
Total Protein: 6.7 g/dL (ref 6.5–8.1)

## 2019-11-03 LAB — LIPASE, BLOOD: Lipase: 49 U/L (ref 11–51)

## 2019-11-03 LAB — CBC WITH DIFFERENTIAL/PLATELET
Abs Immature Granulocytes: 0.09 10*3/uL — ABNORMAL HIGH (ref 0.00–0.07)
Basophils Absolute: 0.1 10*3/uL (ref 0.0–0.1)
Basophils Relative: 0 %
Eosinophils Absolute: 0.1 10*3/uL (ref 0.0–0.5)
Eosinophils Relative: 0 %
HCT: 28.5 % — ABNORMAL LOW (ref 36.0–46.0)
Hemoglobin: 9 g/dL — ABNORMAL LOW (ref 12.0–15.0)
Immature Granulocytes: 1 %
Lymphocytes Relative: 7 %
Lymphs Abs: 0.9 10*3/uL (ref 0.7–4.0)
MCH: 28.8 pg (ref 26.0–34.0)
MCHC: 31.6 g/dL (ref 30.0–36.0)
MCV: 91.1 fL (ref 80.0–100.0)
Monocytes Absolute: 1.2 10*3/uL — ABNORMAL HIGH (ref 0.1–1.0)
Monocytes Relative: 8 %
Neutro Abs: 11.9 10*3/uL — ABNORMAL HIGH (ref 1.7–7.7)
Neutrophils Relative %: 84 %
Platelets: 520 10*3/uL — ABNORMAL HIGH (ref 150–400)
RBC: 3.13 MIL/uL — ABNORMAL LOW (ref 3.87–5.11)
RDW: 13.6 % (ref 11.5–15.5)
WBC: 14.2 10*3/uL — ABNORMAL HIGH (ref 4.0–10.5)
nRBC: 0 % (ref 0.0–0.2)

## 2019-11-03 LAB — TROPONIN I (HIGH SENSITIVITY)
Troponin I (High Sensitivity): 19 ng/L — ABNORMAL HIGH (ref <18)
Troponin I (High Sensitivity): 20 ng/L — ABNORMAL HIGH (ref <18)

## 2019-11-03 LAB — ECHO TEE
MV M vel: 4.62 m/s
MV Peak grad: 85.4 mmHg
Radius: 0.6 cm

## 2019-11-03 LAB — CBG MONITORING, ED: Glucose-Capillary: 266 mg/dL — ABNORMAL HIGH (ref 70–99)

## 2019-11-03 LAB — BRAIN NATRIURETIC PEPTIDE: B Natriuretic Peptide: 956.3 pg/mL — ABNORMAL HIGH (ref 0.0–100.0)

## 2019-11-03 MED ORDER — LEVOTHYROXINE SODIUM 25 MCG PO TABS: 150.0000 ug | ORAL_TABLET | Freq: Every day | ORAL | Status: DC

## 2019-11-03 MED ORDER — ATORVASTATIN CALCIUM 40 MG PO TABS
40.0000 mg | ORAL_TABLET | Freq: Every day | ORAL | Status: DC
  Administered 2019-11-03 – 2019-11-10 (×8): 40 mg via ORAL
  Filled 2019-11-03 (×8): qty 1

## 2019-11-03 MED ORDER — LEVOTHYROXINE SODIUM 75 MCG PO TABS
150.0000 ug | ORAL_TABLET | Freq: Every day | ORAL | Status: DC
  Administered 2019-11-04 – 2019-11-10 (×7): 150 ug via ORAL
  Filled 2019-11-03 (×7): qty 2

## 2019-11-03 MED ORDER — DILTIAZEM HCL-DEXTROSE 125-5 MG/125ML-% IV SOLN (PREMIX)
5.0000 mg/h | INTRAVENOUS | Status: DC
  Administered 2019-11-03: 5 mg/h via INTRAVENOUS
  Administered 2019-11-04 – 2019-11-06 (×6): 10 mg/h via INTRAVENOUS
  Filled 2019-11-03 (×11): qty 125

## 2019-11-03 MED ORDER — ISOSORBIDE DINITRATE 40 MG PO TABS
40.0000 mg | ORAL_TABLET | Freq: Two times a day (BID) | ORAL | Status: DC
  Filled 2019-11-03: qty 1

## 2019-11-03 MED ORDER — INFLUENZA VAC A&B SA ADJ QUAD 0.5 ML IM PRSY
0.5000 mL | PREFILLED_SYRINGE | INTRAMUSCULAR | Status: DC
  Filled 2019-11-03: qty 0.5

## 2019-11-03 MED ORDER — MELATONIN 3 MG PO TABS
3.0000 mg | ORAL_TABLET | Freq: Every day | ORAL | Status: DC
  Administered 2019-11-03 – 2019-11-09 (×7): 3 mg via ORAL
  Filled 2019-11-03 (×7): qty 1

## 2019-11-03 MED ORDER — ISOSORBIDE DINITRATE 10 MG PO TABS
40.0000 mg | ORAL_TABLET | Freq: Two times a day (BID) | ORAL | Status: DC
  Administered 2019-11-03 – 2019-11-10 (×14): 40 mg via ORAL
  Filled 2019-11-03 (×10): qty 4
  Filled 2019-11-03: qty 1
  Filled 2019-11-03 (×4): qty 4

## 2019-11-03 MED ORDER — ATORVASTATIN CALCIUM 40 MG PO TABS: 40.0000 mg | ORAL_TABLET | Freq: Every day | ORAL | Status: DC

## 2019-11-03 MED ORDER — APIXABAN 2.5 MG PO TABS
2.5000 mg | ORAL_TABLET | Freq: Two times a day (BID) | ORAL | Status: DC
  Administered 2019-11-03 – 2019-11-10 (×14): 2.5 mg via ORAL
  Filled 2019-11-03 (×14): qty 1

## 2019-11-03 NOTE — Telephone Encounter (Signed)
LMOVM (DPR) advising transmission received. Pt is currently in the ED at [facility] pending admission per notes, presented in AF w/ RVR.  Transmission also shows ongoing AF w/ RVR.  Provided DC number and office hours for call back if any device-related questions after hospital discharge.

## 2019-11-03 NOTE — Telephone Encounter (Signed)
I told the pt that the nurse will review her transmission and give her a call back.

## 2019-11-03 NOTE — ED Notes (Signed)
ED Provider at bedside. 

## 2019-11-03 NOTE — H&P (Addendum)
Cardiology Admission History and Physical:   Patient ID: Lauren Walker MRN: [id number]; DOB: Dec 24, 1929   Admission date: 11/03/2019  Primary Care Provider: Midge Minium, MD [facility] Cardiologist: Jenne Campus, MD   Chief Complaint:  Dr. Agustin Cree   Patient Profile:   Lauren Walker is a 86 y.o. female with a hx of breast CA only on hormonal therapy, chronic combined systolic and diastolic congestive heart failure, s/p ICD, remote history of coronary artery disease (chronic occlusion of the RCA with mild to moderate nonobstructive disease involving the LAD and left circumflex per [facility] 2016), persisi  atrial fibrillation, severe mitral regurgitation, chronic kidney failure stage IV, essential hypertension, dyslipidemia, and hypothyroidism who is being transferred to [facility] for further evaluation of new onset AF with RVR.   History of Present Illness:   Ms. Lauren Walker is an 86yo F with a hx as stated above who presented to [place] with recurrent atrial fibrillation with RVR, CHF and severe mitral regurgitation who presented with progressive SOB and edema.   She is followed by Dr. Agustin Cree was last seen 10/21/2019 with complaints of chest tightness and noted to be in atrial fibrillation with RVR therefore patient was transferred to [facility] emergency department and was admitted to inpatient status.  During her stay, her pacemaker was interrogated which showed atrial fibrillation with RVR therefore the patient was cardioverted x3 and was continued on Eliquis therapy. She was started on IV diltiazem and continued to go intermittently in and out of atrial fibrillation. She had been previously on Eliquis 2.5 mg twice daily which was continued without interruption.  It was felt she cannot be on amiodarone due to prior VT felt related to amiodarone and QT prolongation.  An echocardiogram showed concern that her mitral regurgitation had progressed due to persistent atrial  fibrillation with rapid rates therefore plan was to pursue a TEE and possible MitraClip consideration for [name].  Her diltiazem was essentially changed to p.o. and her metoprolol was continued. ACEI was discontinued due to poor renal function.  She was also resumed on Isordil and torsemide at discharge with plans for Dr. Agustin Cree follow-up for further management.  TEE was performed 11/02/2019 with LVEF at 50%, mildly reduced RV function, severely dilated LA, no left atrial/left atrial appendage thrombus, severely dilated RA, moderate pericardial effusion,prolapse of A2 scallop with severe, eccentric mitral valve regurgitation, mild to moderate tricuspid regurgitation, moderate grade 3 atheroma plaque involving the transverse and descending aorta.   Unfortunately, she presented to [place] once again today on 11/03/2019 with shortness of breath and palpitations found to be in atrial fibrillation with RVR with rates in the 130 range.  She was started on IV diltiazem with moderate control.  CXR with left-sided pleural effusion.  BNP slightly elevated at 900.  BUN and creatinine elevated at 84 and 2.5 which appears to be slightly higher than her baseline.  Past Medical History:  Diagnosis Date  . Acute CHF (congestive heart failure) ([place]) 05/25/2019  . Anemia associated with stage 4 chronic renal failure ([place]) 05/17/2019  . Aortic stenosis 02/14/2019  . Arthritis   . CAD (coronary artery disease) 08/16/2018  . Chronic kidney disease   . Chronic systolic dysfunction of left ventricle   . CKD stage G4/A1, GFR 15-29 and albumin creatinine ratio <30 mg/g (HCC) 05/17/2019  . Coronary artery disease   . Diabetes mellitus without complication ([place])   . Diverticulitis   . History of MI (  myocardial infarction) 08/16/2018  . HTN (hypertension) 08/16/2018  . Hyperlipidemia associated with type 2 diabetes mellitus ([place]) 08/16/2018  . Hypertension   . Hypothyroid 08/16/2018  . Hypothyroidism   .  ICD (implantable cardioverter-defibrillator) in place 07/06/2019  . Malignant neoplasm of upper-outer quadrant of left breast in female, estrogen receptor positive ([place]) 03/02/2019  . Mitral regurgitation moderate to severe based on echocardiogram in summer 2020 11/04/2018  . Myocardial infarction ([place])   . Pacemaker 06/29/2019  . Paroxysmal atrial fibrillation (HCC)   . Pleural effusion on left 06/29/2019  . Sinus bradycardia 06/17/2019  . Thyroid disease   . Transaminitis 06/17/2019  . Type 2 diabetes mellitus with other circulatory complications ([place]) 08/18/7791  . V-tach [facility]) 06/17/2019    Past Surgical History:  Procedure Laterality Date  . APPENDECTOMY  1936  . BUBBLE STUDY  11/02/2019   Procedure: BUBBLE STUDY;  Surgeon: [name], MD;  Location: [place];  Service: Cardiology;;  . ICD IMPLANT N/A 06/20/2019   Procedure: ICD IMPLANT;  Surgeon: Evans Lance, MD;  Location: [place] CV LAB;  Service: Cardiovascular;  Laterality: N/A;  . IR THORACENTESIS ASP PLEURAL SPACE W/IMG GUIDE  05/27/2019  . PARS PLANA VITRECTOMY Right 12/04/2018   Procedure: RETINAL DETACHMENT REPAIR PPV 25 GAUGE WITH ENDO LASER AIR/FLUID EXCHANGE SF6 GAS INJECTION;  Surgeon: Jalene Mullet, MD;  Location: [place];  Service: Ophthalmology;  Laterality: Right;  . TEE WITHOUT CARDIOVERSION N/A 11/02/2019   Procedure: TRANSESOPHAGEAL ECHOCARDIOGRAM (TEE);  Surgeon: [name], MD;  Location: [place];  Service: Cardiology;  Laterality: N/A;  . TONSILLECTOMY       Medications Prior to Admission: Prior to Admission medications   Medication Sig Start Date End Date Taking? Authorizing Provider  acetaminophen (TYLENOL) 650 MG CR tablet Take 1,300 mg by mouth every 8 (eight) hours as needed for pain.     [provider]  anastrozole (ARIMIDEX) 1 MG tablet TAKE 1 TABLET BY MOUTH EVERY DAY Patient taking differently: Take 1 mg by mouth daily.  09/15/19   Magrinat, Virgie Dad, MD  apixaban  (ELIQUIS) 2.5 MG TABS tablet Take 1 tablet (2.5 mg total) by mouth 2 (two) times daily. 03/10/19   Loel Dubonnet, NP  atorvastatin (LIPITOR) 40 MG tablet TAKE 1 TABLET BY MOUTH EVERY DAY Patient taking differently: Take 40 mg by mouth daily.  10/18/19   Midge Minium, MD  Azelastine-Fluticasone [phone] MCG/ACT SUSP Place 1 spray into both nostrils 2 (two) times daily.     [provider]  b complex vitamins capsule Take 1 capsule by mouth daily.    [provider]  Biotin 1 MG CAPS Take 1 mg by mouth daily.     [provider]  Cholecalciferol (VITAMIN D3) 50 MCG (2000 UT) TABS Take 2,000 Units by mouth at bedtime.     [provider]  diltiazem (CARDIZEM CD) 180 MG 24 hr capsule Take 1 capsule (180 mg total) by mouth daily. 10/26/19   Tobb, Kardie, DO  Ferrous Sulfate (IRON) 325 (65 Fe) MG TABS Take 325 mg by mouth daily with breakfast.     [provider]  isosorbide dinitrate (ISORDIL) 20 MG tablet TAKE 2 TABLETS (40MG ) BY MOUTH TWICE DAILY Patient taking differently: Take 40 mg by mouth 2 (two) times daily.  10/14/19   Midge Minium, MD  levothyroxine (SYNTHROID) 150 MCG tablet TAKE 1 TABLET BY MOUTH DAILY BEFORE BREAKFAST. Patient taking differently: Take 150 mcg by mouth  daily before breakfast.  08/25/19   Midge Minium, MD  magnesium oxide (MAG-OX) 400 MG tablet Take 400 mg by mouth at bedtime.     [provider]  metoprolol tartrate (LOPRESSOR) 25 MG tablet Take 1 tablet (25 mg total) by mouth 2 (two) times daily. 10/24/19   Barrett, Evelene Croon, PA-C  nitroGLYCERIN (NITROSTAT) 0.4 MG SL tablet Place 1 tablet (0.4 mg total) under the tongue every 5 (five) minutes as needed for chest pain. 09/28/19   Midge Minium, MD  torsemide (DEMADEX) 20 MG tablet Take 2 tablets (40 mg total) by mouth 2 (two) times daily. 10/26/19 01/24/20  Tobb, Kardie, DO  vitamin C (ASCORBIC ACID) 500 MG tablet Take 500 mg by mouth daily.    [provider]     Allergies:    Allergies  Allergen Reactions  . Amiodarone Other (See Comments)    Prolonged QT, VT  . Aranesp (Albumin Free) [Darbepoetin Alfa] Other (See Comments)    Patient had cardiac arrest evening after receiving this  . Tape Itching, Rash and Other (See Comments)    Reaction was from the surgical tape from cyst removed    Social History:   Social History   Socioeconomic History  . Marital status: Married    Spouse name: Not on file  . Number of children: Not on file  . Years of education: Not on file  . Highest education level: Not on file  Occupational History  . Not on file  Tobacco Use  . Smoking status: Former Research scientist (life sciences)  . Smokeless tobacco: Never Used  Vaping Use  . Vaping Use: Never used  Substance and Sexual Activity  . Alcohol use: Yes    Comment: Very rare  . Drug use: Not Currently  . Sexual activity: Not Currently  Other Topics Concern  . Not on file  Social History Narrative  . Not on file   Social Determinants of Health   Financial Resource Strain:   . Difficulty of Paying Living Expenses: Not on file  Food Insecurity:   . Worried About Charity fundraiser in the Last Year: Not on file  . Ran Out of Food in the Last Year: Not on file  Transportation Needs:   . Lack of Transportation (Medical): Not on file  . Lack of Transportation (Non-Medical): Not on file  Physical Activity:   . Days of Exercise per Week: Not on file  . Minutes of Exercise per Session: Not on file  Stress:   . Feeling of Stress : Not on file  Social Connections:   . Frequency of Communication with Friends and Family: Not on file  . Frequency of Social Gatherings with Friends and Family: Not on file  . Attends Religious Services: Not on file  . Active Member of Clubs or Organizations: Not on file  . Attends Archivist Meetings: Not on file  . Marital Status: Not on file  Intimate Partner Violence:   . Fear of Current or Ex-Partner: Not on  file  . Emotionally Abused: Not on file  . Physically Abused: Not on file  . Sexually Abused: Not on file    Family History:   The patient's family history includes Arthritis in her daughter, father, mother, and son; Depression in her maternal aunt; Heart attack in her father; Heart disease in her father and mother; Hyperlipidemia in her maternal aunt; Hypertension in her maternal aunt and mother.    ROS:  Please see the  history of present illness.  All other ROS reviewed and negative.     Physical Exam/Data:   Vitals:   11/03/19 1400 11/03/19 1415 11/03/19 1435 11/03/19 1500  BP:   126/85 110/75  Pulse: (!) 136  77 67  Resp: (!) 37 (!) 24 (!) 24 (!) 24  Temp:      TempSrc:      SpO2: 95%  96% 95%   No intake or output data in the 24 hours ending 11/03/19 1619 Last 3 Weights 11/02/2019 10/26/2019 10/24/2019  Weight (lbs) 167 lb 167 lb 0.6 oz 165 lb 1.6 oz  Weight (kg) 75.751 kg 75.769 kg 74.889 kg     There is no height or weight on file to calculate BMI.   General: Well developed, well nourished, NAD Neck: Negative for carotid bruits. No JVD Lungs: Bilateral upper and lower lobe crackles. No wheezes, rales, or rhonchi. Breathing is unlabored. Cardiovascular: Irregularly irregular with S1 S2. + murmurs Abdomen: Soft, non-tender, non-distended. No obvious abdominal masses. Extremities: 1+ BLE edema. Radial pulses 2+ bilaterally Neuro: Alert and oriented. No focal deficits. No facial asymmetry. MAE spontaneously. Psych: Responds to questions appropriately with normal affect.    EKG:  The ECG that was done 11/01/19 was personally reviewed and demonstrates AF with RVR, HR 134bpm   Relevant CV Studies:  Echo: 10/22/2019 1. Severe mitral valve regurgitation. Moderate restriction of posterior  mitral leaflet, with posteriorly directed eccentric jet. Secondary mitral  regurgitation, likely Carpentier type IIIB. Quantitation by PISA suggests moderate regurgitation however there is  splaying of color flow Doppler and the dominant jet demonstrates Coanda effect. These finding in combination suggest severe mitral valve regurgitation. The mitral valve is grossly normal. Severe mitral valve regurgitation.  2. Left ventricular ejection fraction, by estimation, is 45 to 50%. The left ventricle has mildly decreased function. The left ventricle demonstrates regional wall motion abnormalities (see scoring  diagram/findings for description).  3. Right ventricular systolic function is moderately reduced.  4. Left atrial size was severely dilated.  5. Aortic valve regurgitation is trivial.  6. The inferior vena cava is dilated in size with <50% respiratory variability, suggesting right atrial pressure of 15 mmHg.   From Care Everywhere: Discharge summary 12/2014: This patient will be hospitalized within 30 days for a planned procedure/surgery as a continuation of their care: No HOSPITAL COURSE  * ST elevation myocardial infarction (STEMI) of inferior wall ([place]) (present on admission) - Symptoms began more than 72 hours prior to presentation. She continues to remain pain free. - ECG suggests completed inferior infarct. Troponin values suggest completed mild to moderate-sized infarct. - With her time to beginning of symptoms to presentation being delayed, and her ECG findings, there was no definite benefit of emergent catheterization and possible intervention according to the OAT trial. - continue medical therapy with ASA, Plavix, statin, nitrate and continue BB.  - no evidence of any MI complications - echo with normal EF and no other MI complications - catheterization showed occluded proximal RCA with mild to moderate nonobstructive disease elsewhere.  Cath 12/29/2014:  Left heart catheterization and left ventriculography were not performed  given the patient's preserved LV function by echocardiography and renal  insufficiency.  Mild to moderate nonocclusive LAD and  circumflex disease.  Chronically occluded RCA with extensive left-to-right collaterals.  Medical management recommended.   Echo 05/25/19:  1. Left ventricular ejection fraction, by estimation, is 45 to 50%. The  left ventricle has mildly decreased function. The left ventricle  demonstrates  regional wall motion abnormalities (see scoring  diagram/findings for description). There is moderate  left ventricular hypertrophy. Left ventricular diastolic parameters are  indeterminate. There is moderate hypokinesis of the left ventricular,  entire inferior wall.  2. Right ventricular systolic function is normal. The right ventricular  size is normal. There is normal pulmonary artery systolic pressure.  3. Left atrial size was severely dilated.  4. Large pleural effusion in the left lateral region.  5. The mitral valve is grossly normal. Moderate to severe mitral valve  regurgitation.  6. The aortic valve is tricuspid. Aortic valve regurgitation is not  visualized. Mild to moderate aortic valve stenosis. Aortic valve area, by  VTI measures 1.49 cm. Aortic valve mean gradient measures 10.4 mmHg.  Aortic valve Vmax measures 2.17 m/s.  7. The inferior vena cava is dilated in size with >50% respiratory  variability, suggesting right atrial pressure of 8 mmHg.   Comparison(s): 02/16/19: LVEF 60-65%, severe MR, severe LAE.   Echo 02/16/2019:  1. Left ventricular ejection fraction, by visual estimation, is 60 to  65%. is mildly increased left ventricular hypertrophy.  2. Left ventricular diastolic parameters are indeterminate.  3. Mildly dilated left ventricular internal cavity size.  4. Global right ventricle has normal systolic function.The right  ventricular size is normal. No increase in right ventricular wall  thickness.  5. Left atrial size was severely dilated.  6. Right atrial size was normal.  7. Mild mitral valve prolapse.  8. Severe mitral valve  regurgitation. No evidence of mitral stenosis.  9. The tricuspid valve is normal in structure.  10. The aortic valve is normal in structure. Aortic valve regurgitation is  not visualized. Mild aortic valve stenosis.  11. The pulmonic valve was normal in structure. Pulmonic valve  regurgitation is not visualized.  12. There is dilatation of the ascending aorta measuring 38 mm.  13. Mildly elevated pulmonary artery systolic pressure.  14. The inferior vena cava is normal in size with greater than 50%  respiratory variability, suggesting right atrial pressure of 3 mmHg.  15. Severe MR with jet directed posteriorly. Minimal prolaps of the middle  segment of the anterior leaflet of the mitral valve noted A2.   Laboratory Data:  High Sensitivity Troponin:   Recent Labs  Lab 10/20/19 1544 10/20/19 1734 11/03/19 1115 11/03/19 1400  TROPONINIHS 18* 18* 19* 20*      Chemistry Recent Labs  Lab 11/03/19 1115  NA 133*  K 4.3  CL 96*  CO2 24  GLUCOSE 302*  BUN 84*  CREATININE 2.50*  CALCIUM 8.6*  GFRNONAA 17*  GFRAA 19*  ANIONGAP 13    Recent Labs  Lab 11/03/19 1115  PROT 6.7  ALBUMIN 3.2*  AST 16  ALT 16  ALKPHOS 58  BILITOT 0.5   Hematology Recent Labs  Lab 11/03/19 1115  WBC 14.2*  RBC 3.13*  HGB 9.0*  HCT 28.5*  MCV 91.1  MCH 28.8  MCHC 31.6  RDW 13.6  PLT 520*   BNP Recent Labs  Lab 11/03/19 1115  BNP 956.3*    DDimer No results for input(s): DDIMER in the last 168 hours.   Radiology/Studies:  DG Chest 2 View  Result Date: 11/03/2019 CLINICAL DATA:  Shortness of breath. EXAM: CHEST - 2 VIEW COMPARISON:  October 20, 2019. FINDINGS: Stable cardiomegaly. Right-sided pacemaker is unchanged in position. No pneumothorax is noted. Right lung is clear. Moderate left pleural effusion is noted with probable underlying atelectasis or infiltrate. Bony thorax is unremarkable.  IMPRESSION: Moderate left pleural effusion with probable underlying atelectasis or  infiltrate. Aortic Atherosclerosis (ICD10-I70.0). Electronically Signed   By: Marijo Conception M.D.   On: 11/03/2019 11:40   CUP PACEART REMOTE DEVICE CHECK  Result Date: 11/03/2019 Scheduled remote reviewed. Normal device function.  Persistent AF with RVR, on OAC, there is not good ventricular rate control as heart rates are 110-140 bpm.  Sent to triage. Next remote 91 days. Kathy Breach, RN, CCDS, [employer]  Assessment and Plan:    1. Acute on chronic combined diastolic and systolic heart failure: -Admitted 10/24/2019 for recurrent Afib despite cardioversion x3. Unfortunately she presented to [place] 11/03/2019 with shortness of breath and palpitations found to be in atrial fibrillation with RVR with rates in the 130 range. She was started on IV diltiazem with moderate control with rates in the 110's  -CXR with left-sided pleural effusion.   -BNP slightly elevated at 900.   -BUN and creatinine elevated at 84 and 2.5 which appears to be slightly higher than her baseline. -Daily weight, strict I&O -Compression stocking placed  2. Persistent atrial fibrillation with RVR: -Admitted 10/24/2019 for recurrent Afib despite cardioversion x3. Unfortunately she presented to [place] 11/03/2019 with shortness of breath and palpitations found to be in atrial fibrillation with RVR with rates in the 130 range. -Started on IV diltiazem with moderate control with rates in the 110's -Will up-titrate to keep rates <100 -Start IV Lasix for fluid volume overload>>>will discuss with MD  -TEE as below  -Continue Eliquis for AC>>compliant     3. Severe MR:  -Previous diuscussion with Dr. Fraser Din regarding MitraClip evaluation given known mitral regurgitation with initial plan for referral to Dr. Damian Leavell heart team as an outpatient, as her MR is likely to complicate her heart failure and also exacerbate her atrial fibrillation  -She was seen by [name] during her  last hospitalization at which time plan was to undergo TEE performed 11/02/2019 with LVEF at 50%, mildly reduced RV function, severely dilated LA, no left atrial/left atrial appendage thrombus, severely dilated RA, moderate pericardial effusion,prolapse of A2 scallop with severe, eccentric mitral valve regurgitation, mild to moderate tricuspid regurgitation, moderate grade 3 atheroma plaque involving the transverse and descending aorta.  -Would recommend having structural team to evaluate while she is here for possible MitraClip during this hospitalization  4. Known CAD, with occluded RCA with left to right collaterals, episode of chest pain: -Continue aspirin, statin  -Denies anginal symptoms  Severity of Illness: The appropriate patient status for this patient is INPATIENT. Inpatient status is judged to be reasonable and necessary in order to provide the required intensity of service to ensure the patient's safety. The patient's presenting symptoms, physical exam findings, and initial radiographic and laboratory data in the context of their chronic comorbidities is felt to place them at high risk for further clinical deterioration. Furthermore, it is not anticipated that the patient will be medically stable for discharge from the hospital within 2 midnights of admission. The following factors support the patient status of inpatient.   " The patient's presenting symptoms include SOB. " The worrisome physical exam findings include AF. " The initial radiographic and laboratory data are worrisome because of elevated BNP. " The chronic co-morbidities include AF, CAD, CHF.   * I certify that at the point of admission it is my clinical judgment that the patient will require inpatient hospital care spanning beyond 2 midnights from the point of  admission due to high intensity of service, high risk for further deterioration and high frequency of surveillance required.*    For questions or updates, please  contact [place] Please consult [email or web address] for contact info under     Signed, Kathyrn Drown, NP  11/03/2019 4:19 PM   Personally seen and examined. Agree with above.   86 year old female with severe tricuspid regurgitation, transesophageal echocardiogram performed on 11/02/2019, yesterday here with worsening shortness of breath atrial fibrillation with rapid ventricular response, ICD in place.  Sitting on the edge of her bed.  Fairly comfortable currently.  GEN: Elderly, in no acute distress  HEENT: normal  Neck: no JVD, carotid bruits, or masses Cardiac: Irregularly irregular; 2/6 holosystolic murmur, rubs, or gallops, mild lower extremity edema  Respiratory: Crackles at bases GI: soft, nontender, nondistended, + BS MS: no deformity or atrophy  Skin: warm and dry, no rash Neuro:  Alert and Oriented x 3, Strength and sensation are intact Psych: euthymic mood, full affect  Troponin is 20.  BMP is 956.  Troponin is 2.5  Telemetry personally reviewed shows atrial fibrillation with right ventricular spots, occasional pacing spike.  Currently, heart rate is improved.  Assessment and plan:  Atrial fibrillation persistent -Continue with diltiazem for rate control.  Cardioversion did not hold after recently being performed  Mitral valve regurgitation -Severe on TEE. -Has had a recent consultation with [name] with structural heart team.  Since she is here, lets have the structural heart team hopefully be able to review her candidacy for MitraClip once again.  Chronic anticoagulation -On Eliquis.    Acute on chronic diastolic heart failure -Multifactorial from severe mitral regurgitation, atrial fibrillation.  We will give her IV Lasix.  Candee Furbish, MD

## 2019-11-03 NOTE — ED Triage Notes (Signed)
Per EMS:  Pt sob and abdominal pain for 4 days. Hx of chf, neg covid test on Monday.

## 2019-11-03 NOTE — ED Provider Notes (Signed)
[facility] Provider Note   CSN: [id number] Arrival date & time: 11/03/19  1046     History Chief Complaint  Patient presents with  . Shortness of Breath    Lauren Walker is a 86 y.o. female with a past medical history of CHF, CAD, diabetes, hypertension, A. fib, pacemaker in place, presenting to the ED with a chief complaint of shortness of breath and palpitations.  States that she has had the symptoms for the past 2 weeks.  She was seen, evaluated and admitted on 10/24/2019 for recurrent A. fib despite cardioversion.  She has been taking her medications as prescribed.  She was told to increase her dose of torsemide starting on 10/28/2019 help with her shortness of breath.  She has been taking her medications as prescribed but does not feel that it is helping.  Denies significant worsening of lower extremity edema.  Has been having palpitations intermittently for the past 2 weeks but worsened yesterday while she was getting her TEE done.  She reports compliance with all of her home medications.  Denies any chest pain.  Has had a "wet cough" that has been chronic for her.  Denies any vomiting, hemoptysis or supplemental oxygen use at home.  HPI     Past Medical History:  Diagnosis Date  . Acute CHF (congestive heart failure) ([place]) 05/25/2019  . Anemia associated with stage 4 chronic renal failure ([place]) 05/17/2019  . Aortic stenosis 02/14/2019  . Arthritis   . CAD (coronary artery disease) 08/16/2018  . Chronic kidney disease   . Chronic systolic dysfunction of left ventricle   . CKD stage G4/A1, GFR 15-29 and albumin creatinine ratio <30 mg/g (HCC) 05/17/2019  . Coronary artery disease   . Diabetes mellitus without complication ([place])   . Diverticulitis   . History of MI (myocardial infarction) 08/16/2018  . HTN (hypertension) 08/16/2018  . Hyperlipidemia associated with type 2 diabetes mellitus ([place]) 08/16/2018  . Hypertension   . Hypothyroid 08/16/2018  .  Hypothyroidism   . ICD (implantable cardioverter-defibrillator) in place 07/06/2019  . Malignant neoplasm of upper-outer quadrant of left breast in female, estrogen receptor positive ([place]) 03/02/2019  . Mitral regurgitation moderate to severe based on echocardiogram in summer 2020 11/04/2018  . Myocardial infarction ([place])   . Pacemaker 06/29/2019  . Paroxysmal atrial fibrillation (HCC)   . Pleural effusion on left 06/29/2019  . Sinus bradycardia 06/17/2019  . Thyroid disease   . Transaminitis 06/17/2019  . Type 2 diabetes mellitus with other circulatory complications ([place]) 8/33/8250  . V-tach [facility]) 06/17/2019    Patient Active Problem List   Diagnosis Date Noted  . Atrial fibrillation with RVR ([place]) 10/22/2019  . Atrial fibrillation with rapid ventricular response ([place])   . Chest pain of uncertain etiology 53/97/6734  . Atrial fibrillation ([place]) 10/21/2019  . Arthritis of left hip 09/19/2019  . ICD (implantable cardioverter-defibrillator) in place 07/06/2019  . Pleural effusion on left 06/29/2019  . Pacemaker 06/29/2019  . V-tach ([place]) 06/17/2019  . Sinus bradycardia 06/17/2019  . Transaminitis 06/17/2019  . Acute CHF (congestive heart failure) ([place]) 05/25/2019  . CKD stage G4/A1, GFR 15-29 and albumin creatinine ratio <30 mg/g (HCC) 05/17/2019  . Anemia associated with stage 4 chronic renal failure ([place]) 05/17/2019  . Malignant neoplasm of upper-outer quadrant of left breast in female, estrogen receptor positive ([place]) 03/02/2019  . Aortic stenosis 02/14/2019  . Mitral regurgitation moderate to severe based on echocardiogram in summer 2020 11/04/2018  .  Paroxysmal atrial fibrillation ([place]) 10/11/2018  . Hyperlipidemia associated with type 2 diabetes mellitus ([place]) 08/16/2018  . Type 2 diabetes mellitus with other circulatory complications ([place]) 17/61/6073  . Hypothyroid 08/16/2018  . HTN (hypertension) 08/16/2018  . CAD (coronary artery disease) 08/16/2018  . History of MI (myocardial  infarction) 08/16/2018    Past Surgical History:  Procedure Laterality Date  . APPENDECTOMY  1936  . BUBBLE STUDY  11/02/2019   Procedure: BUBBLE STUDY;  Surgeon: [name], MD;  Location: [facility];  Service: Cardiology;;  . ICD IMPLANT N/A 06/20/2019   Procedure: ICD IMPLANT;  Surgeon: Evans Lance, MD;  Location: [facility];  Service: Cardiovascular;  Laterality: N/A;  . IR THORACENTESIS ASP PLEURAL SPACE W/IMG GUIDE  05/27/2019  . PARS PLANA VITRECTOMY Right 12/04/2018   Procedure: RETINAL DETACHMENT REPAIR PPV 25 GAUGE WITH ENDO LASER AIR/FLUID EXCHANGE SF6 GAS INJECTION;  Surgeon: Jalene Mullet, MD;  Location: [facility];  Service: Ophthalmology;  Laterality: Right;  . TEE WITHOUT CARDIOVERSION N/A 11/02/2019   Procedure: TRANSESOPHAGEAL ECHOCARDIOGRAM (TEE);  Surgeon: [name], MD;  Location: [facility];  Service: Cardiology;  Laterality: N/A;  . TONSILLECTOMY       OB History   No obstetric history on file.     Family History  Problem Relation Age of Onset  . Arthritis Mother   . Heart disease Mother   . Hypertension Mother   . Arthritis Father   . Heart attack Father   . Heart disease Father   . Arthritis Daughter   . Arthritis Son   . Depression Maternal Aunt   . Hyperlipidemia Maternal Aunt   . Hypertension Maternal Aunt     Social History   Tobacco Use  . Smoking status: Former Research scientist (life sciences)  . Smokeless tobacco: Never Used  Vaping Use  . Vaping Use: Never used  Substance Use Topics  . Alcohol use: Yes    Comment: Very rare  . Drug use: Not Currently    Home Medications Prior to Admission medications   Medication Sig Start Date End Date Taking? Authorizing Provider  acetaminophen (TYLENOL) 650 MG CR tablet Take 1,300 mg by mouth every 8 (eight) hours as needed for pain.     [provider]  anastrozole (ARIMIDEX) 1 MG tablet TAKE 1 TABLET BY MOUTH EVERY DAY Patient taking differently: Take 1 mg by mouth daily.  09/15/19    Magrinat, Virgie Dad, MD  apixaban (ELIQUIS) 2.5 MG TABS tablet Take 1 tablet (2.5 mg total) by mouth 2 (two) times daily. 03/10/19   Loel Dubonnet, NP  atorvastatin (LIPITOR) 40 MG tablet TAKE 1 TABLET BY MOUTH EVERY DAY Patient taking differently: Take 40 mg by mouth daily.  10/18/19   Midge Minium, MD  Azelastine-Fluticasone [id number] MCG/ACT SUSP Place 1 spray into both nostrils 2 (two) times daily.     [provider]  b complex vitamins capsule Take 1 capsule by mouth daily.    [provider]  Biotin 1 MG CAPS Take 1 mg by mouth daily.     [provider]  Cholecalciferol (VITAMIN D3) 50 MCG (2000 UT) TABS Take 2,000 Units by mouth at bedtime.     [provider]  diltiazem (CARDIZEM CD) 180 MG 24 hr capsule Take 1 capsule (180 mg total) by mouth daily. 10/26/19   Tobb, Kardie, DO  Ferrous Sulfate (IRON) 325 (65 Fe) MG TABS Take 325 mg by mouth daily with breakfast.  [provider]  isosorbide dinitrate (ISORDIL) 20 MG tablet TAKE 2 TABLETS (40MG ) BY MOUTH TWICE DAILY Patient taking differently: Take 40 mg by mouth 2 (two) times daily.  10/14/19   Midge Minium, MD  levothyroxine (SYNTHROID) 150 MCG tablet TAKE 1 TABLET BY MOUTH DAILY BEFORE BREAKFAST. Patient taking differently: Take 150 mcg by mouth daily before breakfast.  08/25/19   Midge Minium, MD  magnesium oxide (MAG-OX) 400 MG tablet Take 400 mg by mouth at bedtime.     [provider]  metoprolol tartrate (LOPRESSOR) 25 MG tablet Take 1 tablet (25 mg total) by mouth 2 (two) times daily. 10/24/19   Barrett, Evelene Croon, PA-C  nitroGLYCERIN (NITROSTAT) 0.4 MG SL tablet Place 1 tablet (0.4 mg total) under the tongue every 5 (five) minutes as needed for chest pain. 09/28/19   Midge Minium, MD  torsemide (DEMADEX) 20 MG tablet Take 2 tablets (40 mg total) by mouth 2 (two) times daily. 10/26/19 01/24/20  Tobb, Kardie, DO  vitamin C (ASCORBIC ACID) 500 MG tablet Take 500  mg by mouth daily.    [provider]    Allergies    Amiodarone, Aranesp (albumin free) [darbepoetin alfa], and Tape  Review of Systems   Review of Systems  Constitutional: Negative for appetite change, chills and fever.  HENT: Negative for ear pain, rhinorrhea, sneezing and sore throat.   Eyes: Negative for photophobia and visual disturbance.  Respiratory: Positive for shortness of breath. Negative for cough, chest tightness and wheezing.   Cardiovascular: Positive for palpitations. Negative for chest pain.  Gastrointestinal: Negative for abdominal pain, blood in stool, constipation, diarrhea, nausea and vomiting.  Genitourinary: Negative for dysuria, hematuria and urgency.  Musculoskeletal: Negative for myalgias.  Skin: Negative for rash.  Neurological: Negative for dizziness, weakness and light-headedness.    Physical Exam Updated Vital Signs BP 126/85   Pulse 77   Temp 98.2 F (36.8 C) (Oral)   Resp (!) 24   SpO2 96%   Physical Exam Vitals and nursing note reviewed.  Constitutional:      General: She is not in acute distress.    Appearance: She is well-developed.  HENT:     Head: Normocephalic and atraumatic.     Nose: Nose normal.  Eyes:     General: No scleral icterus.       Left eye: No discharge.     Conjunctiva/sclera: Conjunctivae normal.  Cardiovascular:     Rate and Rhythm: Tachycardia present. Rhythm irregular.     Heart sounds: Normal heart sounds. No murmur heard.  No friction rub. No gallop.   Pulmonary:     Effort: Pulmonary effort is normal. Tachypnea present. No respiratory distress.     Breath sounds: Normal breath sounds.  Abdominal:     General: Bowel sounds are normal. There is no distension.     Palpations: Abdomen is soft.     Tenderness: There is no abdominal tenderness. There is no guarding.  Musculoskeletal:        General: Normal range of motion.     Cervical back: Normal range of motion and neck supple.  Skin:    General:  Skin is warm and dry.     Findings: No rash.  Neurological:     Mental Status: She is alert.     Motor: No abnormal muscle tone.     Coordination: Coordination normal.     ED Results / Procedures / Treatments   Labs (all labs ordered  are listed, but only abnormal results are displayed) Labs Reviewed  COMPREHENSIVE METABOLIC PANEL - Abnormal; Notable for the following components:      Result Value   Sodium 133 (*)    Chloride 96 (*)    Glucose, Bld 302 (*)    BUN 84 (*)    Creatinine, Ser 2.50 (*)    Calcium 8.6 (*)    Albumin 3.2 (*)    GFR calc non Af Amer 17 (*)    GFR calc Af Amer 19 (*)    All other components within normal limits  CBC WITH DIFFERENTIAL/PLATELET - Abnormal; Notable for the following components:   WBC 14.2 (*)    RBC 3.13 (*)    Hemoglobin 9.0 (*)    HCT 28.5 (*)    Platelets 520 (*)    Neutro Abs 11.9 (*)    Monocytes Absolute 1.2 (*)    Abs Immature Granulocytes 0.09 (*)    All other components within normal limits  BRAIN NATRIURETIC PEPTIDE - Abnormal; Notable for the following components:   B Natriuretic Peptide 956.3 (*)    All other components within normal limits  CBG MONITORING, ED - Abnormal; Notable for the following components:   Glucose-Capillary 266 (*)    All other components within normal limits  TROPONIN I (HIGH SENSITIVITY) - Abnormal; Notable for the following components:   Troponin I (High Sensitivity) 19 (*)    All other components within normal limits  TROPONIN I (HIGH SENSITIVITY) - Abnormal; Notable for the following components:   Troponin I (High Sensitivity) 20 (*)    All other components within normal limits  SARS CORONAVIRUS 2 BY RT PCR [facility] ORDER, [place] [facility])  LIPASE, BLOOD    EKG EKG Interpretation  Date/Time:  Thursday November 03 2019 10:58:54 EDT Ventricular Rate:  134 PR Interval:    QRS Duration: 120 QT Interval:  349 QTC Calculation: 522 R Axis:   -33 Text  Interpretation: Atrial fibrillation Incomplete left bundle branch block Confirmed by Quintella Reichert [phone]) on 11/03/2019 11:02:43 AM   Radiology DG Chest 2 View  Result Date: 11/03/2019 CLINICAL DATA:  Shortness of breath. EXAM: CHEST - 2 VIEW COMPARISON:  October 20, 2019. FINDINGS: Stable cardiomegaly. Right-sided pacemaker is unchanged in position. No pneumothorax is noted. Right lung is clear. Moderate left pleural effusion is noted with probable underlying atelectasis or infiltrate. Bony thorax is unremarkable. IMPRESSION: Moderate left pleural effusion with probable underlying atelectasis or infiltrate. Aortic Atherosclerosis (ICD10-I70.0). Electronically Signed   By: Marijo Conception M.D.   On: 11/03/2019 11:40    Procedures Procedures (including critical care time)  Medications Ordered in ED Medications  diltiazem (CARDIZEM) 125 mg in dextrose 5% 125 mL (1 mg/mL) infusion (10 mg/hr Intravenous Rate/Dose Change 11/03/19 1447)  apixaban (ELIQUIS) tablet 2.5 mg (has no administration in time range)  levothyroxine (SYNTHROID) tablet 150 mcg (has no administration in time range)  isosorbide dinitrate (ISORDIL) tablet 40 mg (has no administration in time range)  atorvastatin (LIPITOR) tablet 40 mg (has no administration in time range)    ED Course  I have reviewed the triage vital signs and the nursing notes.  Pertinent labs & imaging results that were available during my care of the patient were reviewed by me and considered in my medical decision making (see chart for details).  Clinical Course as of Nov 03 1446  Thu Nov 03, 2019  1246 Stable since 2 weeks ago.  Troponin I (High  Sensitivity)(!): 19 [HK]  1246 Creatinine(!): 2.50 [HK]  1246 Elevated from baseline.  BUN(!): 84 [HK]  1246 B Natriuretic Peptide(!): 956.3 [HK]    Clinical Course User Index [HK] Delia Heady, PA-C   MDM Rules/Calculators/A&P                          86 year old female with a past medical  history of CHF, CAD, diabetes, A. fib, pacemaker in place presenting to the ED with a chief complaint of shortness of breath and palpitations.  Persistent symptoms for 2 weeks.  Admitted on 10/24/2019 for recurrent A. fib despite shocking x3.  Since then she has increased her dose of torsemide and Cardizem but reports no improvement in symptoms.  Lungs are clear to auscultation bilaterally.  Initial EKG shows A. fib with rates in the 130s.  She does not appear fluid overloaded.  She denies chest pain but does report palpitations that worsened yesterday after she underwent her TEE.  Does not feel that she is being diuresed adequately.  Chest x-ray shows left-sided pleural effusion which could be indicative of her symptoms.  Troponin flat at 19 which is similar to priors.  BNP slightly elevated to 900.  Her BUN is up to 84 and her creatinine is 2.5 which is slightly higher than her baseline.  We have started her on a Cardizem drip.  She does have a leukocytosis of 14.2.  Covid test is pending.  I spoke to the on-call cardiologist Dr. Sallyanne Kuster, who feels that the patient should be admitted.  Will admit to cardiology service.  Her heart rate has improved to 110s. Patient discussed with and seen by the attending, Dr. Ralene Bathe.  Portions of this note were generated with [employer]obby[employer]. Dictation errors may occur despite best attempts at proofreading.  Final Clinical Impression(s) / ED Diagnoses Final diagnoses:  Pleural effusion, left  Persistent atrial fibrillation [facility])    Rx / DC Orders ED Discharge Orders    None       Delia Heady, PA-C 11/03/19 1448    Quintella Reichert, MD 11/04/19 [phone]

## 2019-11-04 DIAGNOSIS — I480 Paroxysmal atrial fibrillation: Secondary | ICD-10-CM

## 2019-11-04 DIAGNOSIS — I5042 Chronic combined systolic (congestive) and diastolic (congestive) heart failure: Secondary | ICD-10-CM

## 2019-11-04 DIAGNOSIS — Z7901 Long term (current) use of anticoagulants: Secondary | ICD-10-CM

## 2019-11-04 DIAGNOSIS — I340 Nonrheumatic mitral (valve) insufficiency: Secondary | ICD-10-CM

## 2019-11-04 DIAGNOSIS — I4819 Other persistent atrial fibrillation: Secondary | ICD-10-CM

## 2019-11-04 LAB — BASIC METABOLIC PANEL
Anion gap: 12 (ref 5–15)
BUN: 83 mg/dL — ABNORMAL HIGH (ref 8–23)
CO2: 23 mmol/L (ref 22–32)
Calcium: 8.6 mg/dL — ABNORMAL LOW (ref 8.9–10.3)
Chloride: 95 mmol/L — ABNORMAL LOW (ref 98–111)
Creatinine, Ser: 2.71 mg/dL — ABNORMAL HIGH (ref 0.44–1.00)
GFR calc Af Amer: 17 mL/min — ABNORMAL LOW (ref >60)
GFR calc non Af Amer: 15 mL/min — ABNORMAL LOW (ref >60)
Glucose, Bld: 306 mg/dL — ABNORMAL HIGH (ref 70–99)
Potassium: 4.4 mmol/L (ref 3.5–5.1)
Sodium: 130 mmol/L — ABNORMAL LOW (ref 135–145)

## 2019-11-04 LAB — GLUCOSE, CAPILLARY
Glucose-Capillary: 233 mg/dL — ABNORMAL HIGH (ref 70–99)
Glucose-Capillary: 282 mg/dL — ABNORMAL HIGH (ref 70–99)
Glucose-Capillary: 251 mg/dL — ABNORMAL HIGH (ref 70–99)
Glucose-Capillary: 222 mg/dL — ABNORMAL HIGH (ref 70–99)

## 2019-11-04 LAB — PROTIME-INR
INR: 1.8 — ABNORMAL HIGH (ref 0.8–1.2)
Prothrombin Time: 19.9 seconds — ABNORMAL HIGH (ref 11.4–15.2)

## 2019-11-04 LAB — HEMOGLOBIN A1C
Hgb A1c MFr Bld: 7.3 % — ABNORMAL HIGH (ref 4.8–5.6)
Mean Plasma Glucose: 162.81 mg/dL

## 2019-11-04 MED ORDER — AMIODARONE HCL IN DEXTROSE 360-4.14 MG/200ML-% IV SOLN
30.0000 mg/h | INTRAVENOUS | Status: DC
  Administered 2019-11-04 – 2019-11-08 (×9): 30 mg/h via INTRAVENOUS
  Filled 2019-11-04 (×8): qty 200

## 2019-11-04 MED ORDER — NITROGLYCERIN 0.4 MG SL SUBL
0.4000 mg | SUBLINGUAL_TABLET | SUBLINGUAL | Status: AC | PRN
  Administered 2019-11-04 (×3): 0.4 mg via SUBLINGUAL

## 2019-11-04 MED ORDER — NITROGLYCERIN 0.4 MG SL SUBL
SUBLINGUAL_TABLET | SUBLINGUAL | Status: AC
  Filled 2019-11-04: qty 1

## 2019-11-04 MED ORDER — INSULIN ASPART 100 UNIT/ML ~~LOC~~ SOLN
0.0000 [IU] | Freq: Three times a day (TID) | SUBCUTANEOUS | Status: DC
  Administered 2019-11-04: 5 [IU] via SUBCUTANEOUS
  Administered 2019-11-05: 3 [IU] via SUBCUTANEOUS
  Administered 2019-11-05: 2 [IU] via SUBCUTANEOUS
  Administered 2019-11-05 – 2019-11-06 (×2): 3 [IU] via SUBCUTANEOUS
  Administered 2019-11-06: 2 [IU] via SUBCUTANEOUS
  Administered 2019-11-06 – 2019-11-07 (×3): 3 [IU] via SUBCUTANEOUS
  Administered 2019-11-08: 2 [IU] via SUBCUTANEOUS
  Administered 2019-11-08: 5 [IU] via SUBCUTANEOUS
  Administered 2019-11-08: 3 [IU] via SUBCUTANEOUS
  Administered 2019-11-09: 2 [IU] via SUBCUTANEOUS
  Administered 2019-11-09: 3 [IU] via SUBCUTANEOUS
  Administered 2019-11-09 – 2019-11-10 (×2): 2 [IU] via SUBCUTANEOUS
  Administered 2019-11-10: 7 [IU] via SUBCUTANEOUS

## 2019-11-04 MED ORDER — FUROSEMIDE 10 MG/ML IJ SOLN
40.0000 mg | Freq: Once | INTRAMUSCULAR | Status: AC
  Administered 2019-11-04: 40 mg via INTRAVENOUS
  Filled 2019-11-04: qty 4

## 2019-11-04 MED ORDER — AMIODARONE HCL IN DEXTROSE 360-4.14 MG/200ML-% IV SOLN
60.0000 mg/h | INTRAVENOUS | Status: AC
  Administered 2019-11-04: 60 mg/h via INTRAVENOUS
  Filled 2019-11-04: qty 200

## 2019-11-04 MED ORDER — INSULIN ASPART 100 UNIT/ML ~~LOC~~ SOLN
0.0000 [IU] | Freq: Every day | SUBCUTANEOUS | Status: DC
  Administered 2019-11-04 – 2019-11-07 (×2): 2 [IU] via SUBCUTANEOUS
  Administered 2019-11-09: 3 [IU] via SUBCUTANEOUS

## 2019-11-04 NOTE — Consult Note (Signed)
[address]       [place] [address]             [phone]          CARDIOTHORACIC SURGERY CONSULTATION REPORT  PCP is Midge Minium, MD Referring Provider is Sherren Mocha, MD Primary Cardiologist is Jenne Campus, MD  Reason for consultation:  Severe mitral regurgitation  HPI:  Patient is an 86 year old moderately obese female with history of coronary artery disease status post inferior wall myocardial infarction in 2016, mitral regurgitation, chronic combined systolic and diastolic congestive heart failure, persistent atrial fibrillation on long-term anticoagulation, ventricular tachycardia felt secondary to amiodarone status post ICD implantation, stage IV chronic kidney disease, hypertension, hyperlipidemia, hypothyroidism, type 2 diabetes mellitus, and anemia who has been referred for surgical consultation to discuss treatment options for management of severe symptomatic mitral regurgitation.  Patient's cardiac history dates back to 2016 when she presented with out of hospital inferior wall myocardial infarction.  At the time she lived in [place].  Catheterization performed at that time reportedly demonstrated chronic occlusion of the right coronary artery with mild nonobstructive disease in the left coronary circulation.  Medical therapy was recommended.  Several years ago the patient and her husband moved to [place] to be closer to their daughter.  She has been followed recently by Dr. Agustin Cree.  Echocardiograms have documented presence of moderate global left ventricular systolic function with at least moderate if not severe mitral regurgitation.  Patient was hospitalized in April 2021 with acute on chronic heart failure.  She was found to have atrial fibrillation with symptomatic post termination pauses and tachybradycardia syndrome.  She was started on amiodarone but readmitted later that month with ventricular tachycardia and syncope felt  to be related to prolonged QT on amiodarone therapy.  She underwent dual-chamber ICD implantation by Dr. Lovena Le.  Patient did well again until earlier this month when she was hospitalized with atrial fibrillation and another acute exacerbation of chronic combined systolic and diastolic congestive heart failure.  She was cardioverted on 3 occasions but continues to go in and out of atrial fibrillation.  Follow-up echocardiogram was performed which revealed moderate to severe mitral regurgitation and she was referred to [name] for consideration of possible transcatheter edge-to-edge repair.  Patient was discharged from the hospital and underwent TEE 11/02/2019 which confirmed the presence of severe mitral regurgitation with moderate left ventricular systolic dysfunction.  The following day the patient was readmitted to the hospital with recurrent atrial fibrillation, rapid ventricular response, and increased shortness of breath.  Chest x-ray revealed moderate sized left pleural effusion.  The patient was again seen in follow-up by [name] and cardiothoracic surgical consultation was requested to discuss surgical options.  Patient is married and lives locally in [place] with her husband.  She has been retired for many years.  She has difficulty with physical mobility and lives a very sedentary lifestyle.  She walks using a cane for stability inside her house but uses a walker whenever she leaves the house.  She cannot walk long distances at all because of both exertional shortness of breath and chronic pain in her right hip.  She gets short of breath with low-level activity and occasionally at rest.  She has not had any chest pain or chest tightness.  She has had some mild lower extremity edema.  She denies PND.  Her defibrillator has never fired.  She has not had dizzy spells or syncope.  Past  Medical History:  Diagnosis Date  . Acute CHF (congestive heart failure) ([facility]) 05/25/2019  . Anemia associated  with stage 4 chronic renal failure ([facility]) 05/17/2019  . Aortic stenosis 02/14/2019  . Arthritis   . CAD (coronary artery disease) 08/16/2018  . Chronic kidney disease   . Chronic systolic dysfunction of left ventricle   . CKD stage G4/A1, GFR 15-29 and albumin creatinine ratio <30 mg/g ([facility]) 05/17/2019  . Coronary artery disease   . Diabetes mellitus without complication ([facility])   . Diverticulitis   . History of MI (myocardial infarction) 08/16/2018  . HTN (hypertension) 08/16/2018  . Hyperlipidemia associated with type 2 diabetes mellitus ([facility]) 08/16/2018  . Hypertension   . Hypothyroid 08/16/2018  . Hypothyroidism   . ICD (implantable cardioverter-defibrillator) in place 07/06/2019  . Malignant neoplasm of upper-outer quadrant of left breast in female, estrogen receptor positive ([facility]) 03/02/2019  . Mitral regurgitation moderate to severe based on echocardiogram in summer 2020 11/04/2018  . Myocardial infarction ([facility])   . Pacemaker 06/29/2019  . Paroxysmal atrial fibrillation ([facility])   . Pleural effusion on left 06/29/2019  . Sinus bradycardia 06/17/2019  . Thyroid disease   . Transaminitis 06/17/2019  . Type 2 diabetes mellitus with other circulatory complications ([facility]) 7/56/4332  . V-tach [facility]) 06/17/2019    Past Surgical History:  Procedure Laterality Date  . APPENDECTOMY  1936  . BUBBLE STUDY  11/02/2019   Procedure: BUBBLE STUDY;  Surgeon: [name], MD;  Location: [facility];  Service: Cardiology;;  . ICD IMPLANT N/A 06/20/2019   Procedure: ICD IMPLANT;  Surgeon: Evans Lance, MD;  Location: [facility];  Service: Cardiovascular;  Laterality: N/A;  . IR THORACENTESIS ASP PLEURAL SPACE W/IMG GUIDE  05/27/2019  . PARS PLANA VITRECTOMY Right 12/04/2018   Procedure: RETINAL DETACHMENT REPAIR PPV 25 GAUGE WITH ENDO LASER AIR/FLUID EXCHANGE SF6 GAS INJECTION;  Surgeon: Jalene Mullet, MD;  Location: [facility];  Service: Ophthalmology;  Laterality: Right;  . TEE WITHOUT CARDIOVERSION  N/A 11/02/2019   Procedure: TRANSESOPHAGEAL ECHOCARDIOGRAM (TEE);  Surgeon: [name], MD;  Location: [place];  Service: Cardiology;  Laterality: N/A;  . TONSILLECTOMY      Family History  Problem Relation Age of Onset  . Arthritis Mother   . Heart disease Mother   . Hypertension Mother   . Arthritis Father   . Heart attack Father   . Heart disease Father   . Arthritis Daughter   . Arthritis Son   . Depression Maternal Aunt   . Hyperlipidemia Maternal Aunt   . Hypertension Maternal Aunt     Social History   Socioeconomic History  . Marital status: Married    Spouse name: Not on file  . Number of children: Not on file  . Years of education: Not on file  . Highest education level: Not on file  Occupational History  . Not on file  Tobacco Use  . Smoking status: Former Research scientist (life sciences)  . Smokeless tobacco: Never Used  Vaping Use  . Vaping Use: Never used  Substance and Sexual Activity  . Alcohol use: Yes    Comment: Very rare  . Drug use: Not Currently  . Sexual activity: Not Currently  Other Topics Concern  . Not on file  Social History Narrative  . Not on file   Social Determinants of Health   Financial Resource Strain:   . Difficulty of Paying Living Expenses: Not on file  Food Insecurity:   . Worried About  Running Out of Food in the Last Year: Not on file  . Ran Out of Food in the Last Year: Not on file  Transportation Needs:   . Lack of Transportation (Medical): Not on file  . Lack of Transportation (Non-Medical): Not on file  Physical Activity:   . Days of Exercise per Week: Not on file  . Minutes of Exercise per Session: Not on file  Stress:   . Feeling of Stress : Not on file  Social Connections:   . Frequency of Communication with Friends and Family: Not on file  . Frequency of Social Gatherings with Friends and Family: Not on file  . Attends Religious Services: Not on file  . Active Member of Clubs or Organizations: Not on file  . Attends  Archivist Meetings: Not on file  . Marital Status: Not on file  Intimate Partner Violence:   . Fear of Current or Ex-Partner: Not on file  . Emotionally Abused: Not on file  . Physically Abused: Not on file  . Sexually Abused: Not on file    Prior to Admission medications   Medication Sig Start Date End Date Taking? Authorizing Provider  acetaminophen (TYLENOL) 650 MG CR tablet Take 1,300 mg by mouth every 8 (eight) hours as needed for pain.    Yes [provider]  anastrozole (ARIMIDEX) 1 MG tablet TAKE 1 TABLET BY MOUTH EVERY DAY Patient taking differently: Take 1 mg by mouth daily.  09/15/19  Yes Magrinat, Virgie Dad, MD  apixaban (ELIQUIS) 2.5 MG TABS tablet Take 1 tablet (2.5 mg total) by mouth 2 (two) times daily. 03/10/19  Yes Loel Dubonnet, NP  atorvastatin (LIPITOR) 40 MG tablet TAKE 1 TABLET BY MOUTH EVERY DAY Patient taking differently: Take 40 mg by mouth at bedtime.  10/18/19  Yes Midge Minium, MD  b complex vitamins capsule Take 1 capsule by mouth daily.   Yes [provider]  Biotin 1 MG CAPS Take 1 mg by mouth daily.    Yes [provider]  Cholecalciferol (VITAMIN D3) 50 MCG (2000 UT) TABS Take 2,000 Units by mouth at bedtime.    Yes [provider]  diltiazem (CARDIZEM CD) 180 MG 24 hr capsule Take 1 capsule (180 mg total) by mouth daily. 10/26/19  Yes Tobb, Kardie, DO  diltiazem (DILT-XR) 120 MG 24 hr capsule Take 120 mg by mouth daily.   Yes [provider]  Ferrous Sulfate (IRON) 325 (65 Fe) MG TABS Take 325 mg by mouth daily with breakfast.    Yes [provider]  fluticasone (FLONASE) 50 MCG/ACT nasal spray Place 1 spray into both nostrils in the morning and at bedtime.   Yes [provider]  isosorbide dinitrate (ISORDIL) 20 MG tablet TAKE 2 TABLETS (40MG ) BY MOUTH TWICE DAILY Patient taking differently: Take 40 mg by mouth 2 (two) times daily.  10/14/19  Yes Midge Minium, MD    levothyroxine (SYNTHROID) 150 MCG tablet TAKE 1 TABLET BY MOUTH DAILY BEFORE BREAKFAST. Patient taking differently: Take 150 mcg by mouth daily before breakfast.  08/25/19  Yes Midge Minium, MD  magnesium oxide (MAG-OX) 400 MG tablet Take 400 mg by mouth at bedtime.    Yes [provider]  metoprolol tartrate (LOPRESSOR) 25 MG tablet Take 1 tablet (25 mg total) by mouth 2 (two) times daily. 10/24/19  Yes Barrett, Evelene Croon, PA-C  nitroGLYCERIN (NITROSTAT) 0.4 MG SL tablet Place 1 tablet (0.4 mg total) under the tongue  every 5 (five) minutes as needed for chest pain. 09/28/19  Yes Midge Minium, MD  torsemide (DEMADEX) 20 MG tablet Take 2 tablets (40 mg total) by mouth 2 (two) times daily. Patient taking differently: Take 20 mg by mouth 2 (two) times daily.  10/26/19 01/24/20 Yes Tobb, Kardie, DO  vitamin C (ASCORBIC ACID) 500 MG tablet Take 500 mg by mouth daily.   Yes [provider]  Azelastine-Fluticasone 137-50 MCG/ACT SUSP Place 1 spray into both nostrils 2 (two) times daily.  Patient not taking: Reported on 11/03/2019    [provider]    Current Facility-Administered Medications  Medication Dose Route Frequency Provider Last Rate Last Admin  . amiodarone (NEXTERONE PREMIX) 360-4.14 MG/200ML-% (1.8 mg/mL) IV infusion  60 mg/hr Intravenous Continuous Baldwin Jamaica, PA-C 33.3 mL/hr at 11/04/19 1553 60 mg/hr at 11/04/19 1553  . amiodarone (NEXTERONE PREMIX) 360-4.14 MG/200ML-% (1.8 mg/mL) IV infusion  30 mg/hr Intravenous Continuous Baldwin Jamaica, PA-C      . apixaban Arne [place]) tablet 2.5 mg  2.5 mg Oral BID Khatri, Hina, PA-C   2.5 mg at 11/04/19 0914  . atorvastatin (LIPITOR) tablet 40 mg  40 mg Oral Daily Khatri, Hina, PA-C   40 mg at 11/04/19 0914  . diltiazem (CARDIZEM) 125 mg in dextrose 5% 125 mL (1 mg/mL) infusion  5-15 mg/hr Intravenous Continuous Khatri, Hina, PA-C 10 mL/hr at 11/04/19 1553 10 mg/hr at 11/04/19 1553  . influenza vaccine  adjuvanted (FLUAD) injection 0.5 mL  0.5 mL Intramuscular Tomorrow-1000 Croitoru, Mihai, MD      . insulin aspart (novoLOG) injection 0-5 Units  0-5 Units Subcutaneous QHS Bhagat, Bhavinkumar, PA      . insulin aspart (novoLOG) injection 0-9 Units  0-9 Units Subcutaneous TID WC Bhagat, Bhavinkumar, PA      . isosorbide dinitrate (ISORDIL) tablet 40 mg  40 mg Oral BID Khatri, Hina, PA-C   40 mg at 11/04/19 0915  . levothyroxine (SYNTHROID) tablet 150 mcg  150 mcg Oral QAC breakfast Khatri, Hina, PA-C   150 mcg at 11/04/19 0615  . melatonin tablet 3 mg  3 mg Oral QHS Arps, Donald Siva, MD   3 mg at 11/03/19 2128    Allergies  Allergen Reactions  . Amiodarone Other (See Comments)    Prolonged QT, VT  . Aranesp (Albumin Free) [Darbepoetin Alfa] Other (See Comments)    Patient had cardiac arrest evening after receiving this  . Tape Itching, Rash and Other (See Comments)    Reaction was from the surgical tape from cyst removed  . Latex Itching      Review of Systems:   General:  normal appetite, decreased energy, no weight gain, no weight loss, no fever  Cardiac:  no chest pain with exertion, no chest pain at rest, +SOB with exertion, occasional resting SOB, no PND, + orthopnea, + palpitations, + arrhythmia, + atrial fibrillation, + LE edema, no dizzy spells, no syncope  Respiratory:  + chronic shortness of breath, no home oxygen, no productive cough, no dry cough, no bronchitis, no wheezing, no hemoptysis, no asthma, no pain with inspiration or cough, no sleep apnea, no CPAP at night  GI:   no difficulty swallowing, no reflux, no frequent heartburn, no hiatal hernia, no abdominal pain, + constipation, no diarrhea, no hematochezia, no hematemesis, no melena  GU:   no dysuria,  no frequency, no urinary tract infection, no hematuria, no  kidney stones, + kidney disease  Vascular:  no pain suggestive of  claudication, no pain in feet, no leg cramps, + varicose veins, no DVT, no non-healing foot  ulcer  Neuro:   no stroke, no TIA's, no seizures, no headaches, no temporary blindness one eye,  no slurred speech, no peripheral neuropathy, no chronic pain, + instability of gait, mild memory/cognitive dysfunction  Musculoskeletal: + arthritis - primarily involving the back and right hip, no joint swelling, no myalgias, + difficulty walking, limited mobility   Skin:   no rash, no itching, no skin infections, no pressure sores or ulcerations  Psych:   no anxiety, no depression, no nervousness, no unusual recent stress  Eyes:   no blurry vision, no floaters, no recent vision changes, does not wear glasses or contacts  ENT:   mild hearing loss, no loose or painful teeth, no dentures, last saw dentist > 1 year ago  Hematologic:  + easy bruising, no abnormal bleeding, no clotting disorder, no frequent epistaxis  Endocrine:  + diabetes, does not check CBG's at home     Physical Exam:   BP 136/83 (BP Location: Left Arm)   Pulse 100   Temp (!) 97.5 F (36.4 C) (Oral)   Resp 20   Ht 5\' 4"  (1.626 m)   Wt 76.7 kg   SpO2 96%   BMI 29.01 kg/m   General:  Obese, elderly and somewhat frail-appearing  HEENT:  Unremarkable   Neck:   no JVD, no bruits, no adenopathy   Chest:   clear to auscultation, symmetrical breath sounds, no wheezes, no rhonchi   CV:   Irregular rate and rhythm, grade III/VI systolic murmur   Abdomen:  soft, non-tender, no masses   Extremities:  warm, well-perfused, pulses not palpable, mild lower extremity edema  Rectal/GU  Deferred  Neuro:   Grossly non-focal and symmetrical throughout  Skin:   Clean and dry, no rashes, no breakdown  Diagnostic Tests:  Lab Results: Recent Labs    11/03/19 1115  WBC 14.2*  HGB 9.0*  HCT 28.5*  PLT 520*   BMET:  Recent Labs    11/03/19 1115 11/04/19 1049  NA 133* 130*  K 4.3 4.4  CL 96* 95*  CO2 24 23  GLUCOSE 302* 306*  BUN 84* 83*  CREATININE 2.50* 2.71*  CALCIUM 8.6* 8.6*    CBG (last 3)  Recent Labs     11/03/19 2111 11/04/19 0804 11/04/19 1119  GLUCAP 244* 233* 282*   PT/INR:  No results for input(s): LABPROT, INR in the last 72 hours.  CXR:  CHEST - 2 VIEW  COMPARISON:  October 20, 2019.  FINDINGS: Stable cardiomegaly. Right-sided pacemaker is unchanged in position. No pneumothorax is noted. Right lung is clear. Moderate left pleural effusion is noted with probable underlying atelectasis or infiltrate. Bony thorax is unremarkable.  IMPRESSION: Moderate left pleural effusion with probable underlying atelectasis or infiltrate.  Aortic Atherosclerosis (ICD10-I70.0).   Electronically Signed   By: Marijo Conception M.D.   On: 11/03/2019 11:40     TRANSESOPHOGEAL ECHO REPORT       Patient Name:  Lauren Walker Date of Exam: 11/02/2019  Medical Rec #: [id number]   Height:    64.0 in  Accession #:  [id number]   Weight:    167.0 lb  Date of Birth: Apr 21, 1929   BSA:     1.812 m  Patient Age:  44 years    BP:      128/89 mmHg  Patient Gender: F       HR:  105 bpm.  Exam Location: Inpatient   Procedure: Transesophageal Echo, 3D Echo, Color Doppler and Cardiac  Doppler                 MODIFIED REPORT:  This report was modified by Cherlynn Kaiser MD on 11/03/2019 due to  revision.  Indications:   Mitral Regurgitation    History:     Patient has prior history of Echocardiogram examinations,  most          recent 10/22/2019. CHF, Previous Myocardial Infarction and  CAD,          Pacemaker, Arrythmias:Atrial Fibrillation; Risk          Factors:Diabetes and Hypertension.    Sonographer:   Mikki Santee RDCS (AE)  Referring Phys: [id number] RHONDA G BARRETT  Diagnosing Phys: Cherlynn Kaiser MD   PROCEDURE: After discussion of the risks and benefits of a TEE, an  informed consent was obtained from the patient. TEE procedure time was 31  minutes. The  transesophogeal probe was passed without difficulty through  the esophogus of the patient. Imaged  were obtained with the patient in a left lateral decubitus position. Local  oropharyngeal anesthetic was provided with Cetacaine. Sedation performed  by different physician. The patient was monitored while under deep  sedation. Anesthestetic sedation was  provided intravenously by Anesthesiology: 179.71mg  of Propofol, 100mg  of  Lidocaine. Image quality was good. The patient's vital signs; including  heart rate, blood pressure, and oxygen saturation; remained stable  throughout the procedure. The patient  developed no complications during the procedure.   IMPRESSIONS    1. Left ventricular ejection fraction, by estimation, is 50%. The left  ventricle has low normal function.  2. Right ventricular systolic function is mildly reduced. The right  ventricular size is normal.  3. Left atrial size was severely dilated. No left atrial/left atrial  appendage thrombus was detected. The LAA emptying velocity was 37 cm/s.  4. Right atrial size was severely dilated.  5. Moderate pericardial effusion.  6. Prolapse of A2 scallop with severe, eccentric mitral valve  regurgitation. Multiple jets. Dominant jet arises at A2 P2 scallop.  Dominant jet ERO 0.18, RV 24 mL.   Transcatheter Edge to Edge repair measurements:   PMVL length 9 mm   Flail gap 5 mm   Flail width 5 mm   Coaptation depth 8 mm   Coaptation length 3.4 mm   No significant leaflet calcifications. The mitral valve is  degenerative. Findings suggest Carpentier type II primary mitral valve  regurgitation.  7. Tricuspid valve regurgitation is mild to moderate.  8. The aortic valve is calcified. There is moderate calcification of the  aortic valve. Aortic valve regurgitation is trivial.  9. There is Moderate (Grade III) atheroma plaque involving the transverse  and descending aorta.  10. Agitated saline  contrast bubble study was negative, with no evidence  of any interatrial shunt.   FINDINGS  Left Ventricle: Left ventricular ejection fraction, by estimation, is  50%. The left ventricle has low normal function. The left ventricular  internal cavity size was normal in size.   Right Ventricle: The right ventricular size is normal. No increase in  right ventricular wall thickness. Right ventricular systolic function is  mildly reduced.   Left Atrium: Left atrial size was severely dilated. No left atrial/left  atrial appendage thrombus was detected. The LAA emptying velocity was 37  cm/s.   Right Atrium: Right atrial size was severely dilated.   Pericardium: A moderately sized  pericardial effusion is present.   Mitral Valve: Prolapse of A2 scallop with severe, eccentric mitral valve  regurgitation. Multiple jets. Dominant jet arises at A2 P2 scallop.  Dominant jet ERO 0.18, RV 24 mL.  Transcatheter Edge to Edge repair measurements:  PMVL length 9 mm  Flail gap 5 mm  Flail width 5 mm  Coaptation depth 8 mm  Coaptation length 3.4 mm  No significant leaflet calcifications. The mitral valve is degenerative in  appearance. Findings suggest Carpentier type II primary mitral valve  regurgitation. The mean mitral valve gradient is 2.4 mmHg with average  heart rate of 95 bpm.   Tricuspid Valve: The tricuspid valve is normal in structure. Tricuspid  valve regurgitation is mild to moderate.   Aortic Valve: The aortic valve is calcified. There is moderate  calcification of the aortic valve. Aortic valve regurgitation is trivial.   Pulmonic Valve: The pulmonic valve was normal in structure. Pulmonic valve  regurgitation is trivial.   Aorta: The aortic root and ascending aorta are structurally normal, with  no evidence of dilitation. There is moderate (Grade III) atheroma plaque  involving the transverse and descending aorta.   IAS/Shunts: No atrial level shunt detected by color flow  Doppler. Agitated  saline contrast was given intravenously to evaluate for intracardiac  shunting. Agitated saline contrast bubble study was negative, with no  evidence of any interatrial shunt.   Additional Comments: A pacer wire is visualized in the right atrium and  right ventricle.     MITRAL VALVE  MV Mean grad: 2.4 mmHg  MR Peak grad:  85.4 mmHg  MR Mean grad:  57.0 mmHg  MR Vmax:    462.00 cm/s  MR Vmean:    360.0 cm/s  MR PISA:    2.26 cm  MR PISA Radius: 0.60 cm   Cherlynn Kaiser MD  Electronically signed by Cherlynn Kaiser MD  Signature Date/Time: 11/03/2019/3:11:56 PM      ECHOCARDIOGRAM LIMITED REPORT       Patient Name:  Lauren Walker Date of Exam: 10/22/2019  Medical Rec #: [id number]   Height:    64.0 in  Accession #:  [id number]   Weight:    164.0 lb  Date of Birth: May 24, 1929   BSA:     1.798 m  Patient Age:  89 years    BP:      119/44 mmHg  Patient Gender: F       HR:      64 bpm.  Exam Location: Inpatient   Procedure: Limited Echo, Cardiac Doppler and Limited Color Doppler                 MODIFIED REPORT:  This report was modified by Cherlynn Kaiser MD on 10/22/2019 due to  revision.  Indications:   Mitral valve insufficiency 424.0 / 134.0    History:     Patient has prior history of Echocardiogram examinations,  most          recent 05/25/2019. CHF, CAD and Previous Myocardial  Infarction,          Pacemaker and Defibrillator, Mitral Valve Disease,          Arrythmias:Atrial Fibrillation; Risk Factors:Diabetes and          Hypertension. H/o mitral valve insufficiency.    Sonographer:   Alvino Chapel RCS  Referring Phys: [id number] [name]  Diagnosing Phys: Cherlynn Kaiser MD   IMPRESSIONS    1. Severe mitral valve regurgitation. Moderate restriction of  posterior  mitral leaflet, with posteriorly directed  eccentric jet. Secondary mitral  regurgitation, likely Carpentier type IIIB. Quantitation by PISA suggests  moderate regurgitation however  there is splaying of color flow Doppler and the dominant jet demonstrates  Coanda effect. These finding in combination suggest severe mitral valve  regurgitation. The mitral valve is grossly normal. Severe mitral valve  regurgitation.  2. Left ventricular ejection fraction, by estimation, is 45 to 50%. The  left ventricle has mildly decreased function. The left ventricle  demonstrates regional wall motion abnormalities (see scoring  diagram/findings for description).  3. Right ventricular systolic function is moderately reduced.  4. Left atrial size was severely dilated.  5. Aortic valve regurgitation is trivial.  6. The inferior vena cava is dilated in size with <50% respiratory  variability, suggesting right atrial pressure of 15 mmHg.   Comparison(s): A prior study was performed on 05/25/19. No significant  change from prior study. Prior images reviewed side by side.   FINDINGS  Left Ventricle: Left ventricular ejection fraction, by estimation, is 45  to 50%. The left ventricle has mildly decreased function. The left  ventricle demonstrates regional wall motion abnormalities.     LV Wall Scoring:  The basal inferior segment is akinetic. The mid and distal inferior wall  is  hypokinetic.   Right Ventricle: Right ventricular systolic function is moderately  reduced.   Left Atrium: Left atrial size was severely dilated.   Pericardium: A small pericardial effusion is present.   Mitral Valve: Severe mitral valve regurgitation. Moderate restriction of  posterior mitral leaflet, with posteriorly directed eccentric jet.  Secondary mitral regurgitation, likely Carpentier type IIIB. Quantitation  by PISA suggests moderate regurgitation  however there is splaying of color flow Doppler and the dominant jet  demonstrates Coanda effect. These  finding in combination suggest severe  mitral valve regurgitation. The mitral valve is grossly normal. Severe  mitral valve regurgitation.   Aortic Valve: Aortic valve regurgitation is trivial. There is moderate  calcification of the aortic valve.   Venous: A systolic blunting flow pattern is recorded from the right lower  pulmonary vein. The inferior vena cava is dilated in size with less than  50% respiratory variability, suggesting right atrial pressure of 15 mmHg.   LEFT VENTRICLE  PLAX 2D  LVOT diam:   1.70 cm  LVOT Area:   2.27 cm     LEFT ATRIUM       Index  LA Vol (A2C):  116.0 ml 64.51 ml/m  LA Vol (A4C):  106.0 ml 58.95 ml/m  LA Biplane Vol: 113.0 ml 62.84 ml/m  MR Peak grad:  126.6 mmHg  MR Mean grad:  87.0 mmHg  SHUNTS  MR Vmax:     562.50 cm/s Systemic Diam: 1.70 cm  MR Vmean:    443.5 cm/s  MR PISA:     2.26 cm  MR PISA Eff ROA: 13 mm  MR PISA Radius: 0.60 cm   Cherlynn Kaiser MD  Electronically signed by Cherlynn Kaiser MD  Signature Date/Time: 10/22/2019/6:16:48 PM      Impression:  Patient has stage D severe symptomatic mitral regurgitation with longstanding history of chronic combined systolic and diastolic congestive heart failure as well as multiple hospital admissions for acute exacerbation of chronic symptoms, oftentimes related to recurrent atrial fibrillation with rapid ventricular response.  Symptoms of congestive heart failure are clearly longstanding and multifactorial, and the patient's functional status is further limited by significant physical deconditioning with decreased physical mobility.  I  have personally reviewed the patient's recent transthoracic and transesophageal echocardiograms.  Echocardiograms demonstrate the presence of what appears to be relatively stable but significant chronic left ventricular systolic dysfunction with ejection fraction estimated 45 to 50% in the setting of severe mitral  regurgitation.  The patient does not have significant wall motion abnormalities in the left ventricle is not particularly dilated.  There is severe left and right atrial enlargement with significant annular dilatation of the mitral valve.  There is severe mitral regurgitation.  The jet of regurgitation is eccentric and directed posteriorly around the left atrium.  There is no mitral valve prolapse.  Rather, there is clear restriction of the posterior leaflet with an overriding anterior leaflet.  I do not see any ruptured or significantly elongated cords on the A2 segment of the anterior leaflet.  There is some mitral annular calcification.  I suspect the mitral regurgitation is a combination of both primary and secondary disease related to annular dilatation, mitral annular calcification with a relatively small and thin posterior leaflet, and significant posterior leaflet restriction which may be at least partially related to the patient's previous inferior wall myocardial infarction.  I agree the patient would benefit from mitral valve repair.  However, I would not consider this elderly patient a candidate for conventional surgical intervention under any circumstances due to her extremely advanced age, numerous severe comorbid medical problems, limited physical mobility, and severe physical deconditioning.  I think it might be reasonable to consider transcatheter edge-to-edge repair using MitraClip.  However, at some point prior to valve repair it might be reasonable to consider left and right heart catheterization to rule out the presence of significant coronary artery disease which might affect long-term prognosis and treatment.  Finally, the patient might also benefit from therapeutic thoracentesis for left pleural effusion as well as pericardial centesis for her significant pericardial effusion.     Plan:  The patient and her husband were counseled at length regarding treatment alternatives for  management of severe symptomatic mitral regurgitation.  Alternative approaches such as conventional surgical mitral valve repair or replacement with or without minimally-invasive techniques, percutaneous edge-to-edge Mitraclip repair, and continued medical therapy without intervention were compared and contrasted at length.  The risks associated with conventional surgery were discussed in detail, as were expectations for post-operative convalescence, and why I would be reluctant to consider this patient a candidate for conventional surgery under any circumstances.  Issues specific to Mitraclip repair were discussed including questions about long term freedom from persistent or recurrent mitral regurgitation, the potential for device migration or embolization, and other technical complications related to the procedure itself.   Long-term prognosis with medical therapy was discussed. This discussion was placed in the context of the patient's own specific clinical presentation and past medical history.  All of their questions have been addressed.  The patient is interested in proceeding with further diagnostic work-up and possible transcatheter edge-to-edge repair.    I spent in excess of 90 minutes during the conduct of this hospital consultation and >50% of this time involved direct face-to-face encounter for counseling and/or coordination of the patient's care.     Valentina Gu. Roxy Manns, MD 11/04/2019 3:59 PM

## 2019-11-04 NOTE — Progress Notes (Addendum)
Progress Note  Patient Name: Lauren Walker Date of Encounter: 11/04/2019  [facility] Cardiologist: Jenne Campus, MD   Subjective   Palpitations improved. Still short of breat with orthopnea. No chest pain.   Inpatient Medications    Scheduled Meds: . apixaban  2.5 mg Oral BID  . atorvastatin  40 mg Oral Daily  . influenza vaccine adjuvanted  0.5 mL Intramuscular Tomorrow-1000  . isosorbide dinitrate  40 mg Oral BID  . levothyroxine  150 mcg Oral QAC breakfast  . melatonin  3 mg Oral QHS   Continuous Infusions: . diltiazem (CARDIZEM) infusion 10 mg/hr (11/04/19 0004)   PRN Meds:    Vital Signs    Vitals:   11/03/19 1925 11/03/19 2100 11/04/19 0500 11/04/19 0520  BP:  120/86 139/65   Pulse: (!) 116 65 70 (!) 126  Resp:  18 18   Temp:  97.6 F (36.4 C) 97.9 F (36.6 C)   TempSrc:  Oral Oral   SpO2: 97% 96% 100% 97%  Weight:    76.7 kg  Height:        Intake/Output Summary (Last 24 hours) at 11/04/2019 0759 Last data filed at 11/04/2019 0600 Gross per 24 hour  Intake 171.34 ml  Output 450 ml  Net -278.66 ml   Last 3 Weights 11/04/2019 11/03/2019 11/02/2019  Weight (lbs) 169 lb 169 lb 12.8 oz 167 lb  Weight (kg) 76.658 kg 77.021 kg 75.751 kg      Telemetry    Atrial fibrillation at 90-110s - Personally Reviewed  ECG    No new tracing   Physical Exam   GEN: Pleasant elderly female in no acute distress.   Neck: No JVD Cardiac: Ir Ir , + murmurs, rubs, or gallops.  Respiratory: bibasilar crackles GI: Soft, nontender, non-distended  FT:DDUKG edema; No deformity. Neuro:  Nonfocal  Psych: Normal affect   Labs    High Sensitivity Troponin:   Recent Labs  Lab 10/20/19 1544 10/20/19 1734 11/03/19 1115 11/03/19 1400  TROPONINIHS 18* 18* 19* 20*      Chemistry Recent Labs  Lab 11/03/19 1115  NA 133*  K 4.3  CL 96*  CO2 24  GLUCOSE 302*  BUN 84*  CREATININE 2.50*  CALCIUM 8.6*  PROT 6.7  ALBUMIN 3.2*  AST 16  ALT 16  ALKPHOS  58  BILITOT 0.5  GFRNONAA 17*  GFRAA 19*  ANIONGAP 13     Hematology Recent Labs  Lab 11/03/19 1115  WBC 14.2*  RBC 3.13*  HGB 9.0*  HCT 28.5*  MCV 91.1  MCH 28.8  MCHC 31.6  RDW 13.6  PLT 520*    BNP Recent Labs  Lab 11/03/19 1115  BNP 956.3*     Radiology    DG Chest 2 View  Result Date: 11/03/2019 CLINICAL DATA:  Shortness of breath. EXAM: CHEST - 2 VIEW COMPARISON:  October 20, 2019. FINDINGS: Stable cardiomegaly. Right-sided pacemaker is unchanged in position. No pneumothorax is noted. Right lung is clear. Moderate left pleural effusion is noted with probable underlying atelectasis or infiltrate. Bony thorax is unremarkable. IMPRESSION: Moderate left pleural effusion with probable underlying atelectasis or infiltrate. Aortic Atherosclerosis (ICD10-I70.0). Electronically Signed   By: Marijo Conception M.D.   On: 11/03/2019 11:40   ECHO TEE  Result Date: 11/03/2019    TRANSESOPHOGEAL ECHO REPORT   Patient Name:   Lauren Walker Date of Exam: 11/02/2019 Medical Rec #:  [id number]      Height:  64.0 in Accession #:    [id number]     Weight:       167.0 lb Date of Birth:  1929/08/16      BSA:          1.812 m Patient Age:    86 years       BP:           128/89 mmHg Patient Gender: F              HR:           105 bpm. Exam Location:  Inpatient Procedure: Transesophageal Echo, 3D Echo, Color Doppler and Cardiac Doppler                               MODIFIED REPORT: This report was modified by Cherlynn Kaiser MD on 11/03/2019 due to revision.  Indications:     Mitral Regurgitation  History:         Patient has prior history of Echocardiogram examinations, most                  recent 10/22/2019. CHF, Previous Myocardial Infarction and CAD,                  Pacemaker, Arrythmias:Atrial Fibrillation; Risk                  Factors:Diabetes and Hypertension.  Sonographer:     Mikki Santee RDCS (AE) Referring Phys:  1993 RHONDA G BARRETT Diagnosing Phys: Cherlynn Kaiser MD  PROCEDURE: After discussion of the risks and benefits of a TEE, an informed consent was obtained from the patient. TEE procedure time was 31 minutes. The transesophogeal probe was passed without difficulty through the esophogus of the patient. Imaged were obtained with the patient in a left lateral decubitus position. Local oropharyngeal anesthetic was provided with Cetacaine. Sedation performed by different physician. The patient was monitored while under deep sedation. Anesthestetic sedation was provided intravenously by Anesthesiology: 179.71mg  of Propofol, 100mg  of Lidocaine. Image quality was good. The patient's vital signs; including heart rate, blood pressure, and oxygen saturation; remained stable throughout the procedure. The patient developed no complications during the procedure. IMPRESSIONS  1. Left ventricular ejection fraction, by estimation, is 50%. The left ventricle has low normal function.  2. Right ventricular systolic function is mildly reduced. The right ventricular size is normal.  3. Left atrial size was severely dilated. No left atrial/left atrial appendage thrombus was detected. The LAA emptying velocity was 37 cm/s.  4. Right atrial size was severely dilated.  5. Moderate pericardial effusion.  6. Prolapse of A2 scallop with severe, eccentric mitral valve regurgitation. Multiple jets. Dominant jet arises at A2 P2 scallop. Dominant jet ERO 0.18, RV 24 mL.     Transcatheter Edge to Edge repair measurements:     PMVL length 9 mm     Flail gap 5 mm     Flail width 5 mm     Coaptation depth 8 mm     Coaptation length 3.4 mm     No significant leaflet calcifications. The mitral valve is degenerative. Findings suggest Carpentier type II primary mitral valve regurgitation.  7. Tricuspid valve regurgitation is mild to moderate.  8. The aortic valve is calcified. There is moderate calcification of the aortic valve. Aortic valve regurgitation is trivial.  9. There is Moderate (Grade III) atheroma  plaque involving the transverse and descending aorta. 10. Agitated  saline contrast bubble study was negative, with no evidence of any interatrial shunt. FINDINGS  Left Ventricle: Left ventricular ejection fraction, by estimation, is 50%. The left ventricle has low normal function. The left ventricular internal cavity size was normal in size. Right Ventricle: The right ventricular size is normal. No increase in right ventricular wall thickness. Right ventricular systolic function is mildly reduced. Left Atrium: Left atrial size was severely dilated. No left atrial/left atrial appendage thrombus was detected. The LAA emptying velocity was 37 cm/s. Right Atrium: Right atrial size was severely dilated. Pericardium: A moderately sized pericardial effusion is present. Mitral Valve: Prolapse of A2 scallop with severe, eccentric mitral valve regurgitation. Multiple jets. Dominant jet arises at A2 P2 scallop. Dominant jet ERO 0.18, RV 24 mL. Transcatheter Edge to Edge repair measurements: PMVL length 9 mm Flail gap 5 mm Flail width 5 mm Coaptation depth 8 mm Coaptation length 3.4 mm No significant leaflet calcifications. The mitral valve is degenerative in appearance. Findings suggest Carpentier type II primary mitral valve regurgitation. The mean mitral valve gradient is 2.4 mmHg with average heart rate of 95 bpm. Tricuspid Valve: The tricuspid valve is normal in structure. Tricuspid valve regurgitation is mild to moderate. Aortic Valve: The aortic valve is calcified. There is moderate calcification of the aortic valve. Aortic valve regurgitation is trivial. Pulmonic Valve: The pulmonic valve was normal in structure. Pulmonic valve regurgitation is trivial. Aorta: The aortic root and ascending aorta are structurally normal, with no evidence of dilitation. There is moderate (Grade III) atheroma plaque involving the transverse and descending aorta. IAS/Shunts: No atrial level shunt detected by color flow Doppler. Agitated  saline contrast was given intravenously to evaluate for intracardiac shunting. Agitated saline contrast bubble study was negative, with no evidence of any interatrial shunt. Additional Comments: A pacer wire is visualized in the right atrium and right ventricle.  MITRAL VALVE MV Mean grad: 2.4 mmHg MR Peak grad:   85.4 mmHg MR Mean grad:   57.0 mmHg MR Vmax:        462.00 cm/s MR Vmean:       360.0 cm/s MR PISA:        2.26 cm MR PISA Radius: 0.60 cm Cherlynn Kaiser MD Electronically signed by Cherlynn Kaiser MD Signature Date/Time: 11/03/2019/3:11:56 PM    Final (Updated)    CUP PACEART REMOTE DEVICE CHECK  Result Date: 11/03/2019 Scheduled remote reviewed. Normal device function.  Persistent AF with RVR, on OAC, there is not good ventricular rate control as heart rates are 110-140 bpm.  Sent to triage. Next remote 91 days. Kathy Breach, RN, CCDS, [employer]   Cardiac Studies   TEE 11/02/19 IMPRESSIONS    1. Left ventricular ejection fraction, by estimation, is 50%. The left  ventricle has low normal function.  2. Right ventricular systolic function is mildly reduced. The right  ventricular size is normal.  3. Left atrial size was severely dilated. No left atrial/left atrial  appendage thrombus was detected. The LAA emptying velocity was 37 cm/s.  4. Right atrial size was severely dilated.  5. Moderate pericardial effusion.  6. Prolapse of A2 scallop with severe, eccentric mitral valve  regurgitation. Multiple jets. Dominant jet arises at A2 P2 scallop.  Dominant jet ERO 0.18, RV 24 mL.   Transcatheter Edge to Edge repair measurements:   PMVL length 9 mm   Flail gap 5 mm   Flail width 5 mm   Coaptation depth 8 mm   Coaptation length 3.4  mm   No significant leaflet calcifications. The mitral valve is  degenerative. Findings suggest Carpentier type II primary mitral valve  regurgitation.  7. Tricuspid valve regurgitation is mild to moderate.  8.  The aortic valve is calcified. There is moderate calcification of the  aortic valve. Aortic valve regurgitation is trivial.  9. There is Moderate (Grade III) atheroma plaque involving the transverse  and descending aorta.  10. Agitated saline contrast bubble study was negative, with no evidence  of any interatrial shunt.   Patient Profile     86 y.o. female with a hx of breast CA only on hormonal therapy, chronic combined systolic and diastolic congestive heart failure,s/p ICD,remote history of coronary artery disease (chronic occlusion of the RCA with mild to moderate nonobstructive disease involving the LAD and left circumflex per [facility] 2016), persistent atrial fibrillation, severe mitral regurgitation, chronic kidney failure stage IV, essential hypertension, dyslipidemia, and hypothyroidism presented for palpitations and worsening dyspnea with orthopnea.   Assessment & Plan    1. Acute on chronic combined CHF - CXR with left sided moderate pleural effusion. BNP 956.  Multifactorial from severe mitral regurgitation and atrial fibrillation with RVR. Plan was to give lasix yesterday but did not order. Evidence of volume overload by exam. - Will give IV lasix 40mg  x 1 and discuss further dosage with MD.  - Strict I & O and daily weight   2. Acute on CKD IV - Her Scr was running in 1.7-1.8 in May 2020 however recently in 2-2.3 - Scr of 2.5 yesterday.  - Daily BMET with diuresis   3. Persistent atrial fibrillation with RVR - Failed recent cardioversion x 3. HR now improved to 90-110s. Will continue IV Cardizem at 27ml/hour until further plan.  - No amiodarone due to prior VT felt related to amiodarone and QT prolongation. May need EP evaluation.  - Continue Eliquis 2.5mg  BID for anticoagulation  3. Severe Mitral regurgitation - Seen by [name] last admission and underwent outpatient TEE on  9/15. Will have structural team to see her this admission for Mitral Clip evaluation.  4. CAD  with know occluded RCA (L to R collaterals) - chest pain free - Continue statin   5. HTN - BP stable  6. HLD - 12/29/2018: Cholesterol 109; HDL 48.40; LDL Cholesterol 30; Triglycerides 154.0; VLDL 30.8  - Continue statin   For questions or updates, please contact [facility] Please consult [email or web address] for contact info under        SignedLeanor Kail, PA  11/04/2019, 7:59 AM    Personally seen and examined. Agree with above.  Continue with IV diuresis. Still short of breath, not at baseline. Discussed with [name] of structural heart team. He will review once again for candidacy of MitraClip.  Persistent atrial fibrillation. Has failed cardioversion x3. Failed amiodarone as well felt to be induced VT from amiodarone. QT prolongation. Not sure what other options there are other than AV nodal ablation.  Has ICD in place.  I will have EP evaluate.  Chronic kidney disease stage IV -Continue to monitor function closely. Diuresing.  CAD -Occluded RCA with left to right collaterals. No angina.  Extremely challenging situation. Clearly she is hampered by a multitude of factors including advanced age, severe mitral regurgitation, atrial fibrillation which is failed cardioversion, diastolic heart failure. Her options may be limited.  Candee Furbish, MD

## 2019-11-04 NOTE — Consult Note (Addendum)
[place] VALVE TEAM  Inpatient MitraClip Consultation:   Patient ID: [id number] WASZAK; [id number]; 1929/06/17   Admit date: 11/03/2019 Date of Consult: 11/04/2019  Primary Care Provider: Midge Minium, MD Primary Cardiologist: Dr. Agustin Cree    Patient Profile:   Lauren Walker is a 86 y.o. female with a hx of breast CA only on hormonal therapy, chronic combined S/D CHF, ventricular tachycardia felt to be related to amiodarones/p ICD,remote history of coronary artery disease (chronic occlusion of the RCA with mild to moderate nonobstructive disease involving the LAD and left circumflex per [facility] 2016), persisitant atrial fibrillation on Eliquis s/p multiple failed cardioversions, CKD stage IV, HTN, HLD, hypothyroidism and severe mixed mitral regurgitation who is being seen today for the evaluation of severe MR and candidacy for TEER at the request of [name].  History of Present Illness:   Lauren Walker is originally for [place]. She and her husband moved to [place] last year to be close to family. They have lived independently, the patient retired from teaching and her husband a retired Engineer, maintenance (IT). Of note, she has had regular dental work up until the covid pandemic and has excellent Human resources officer.   The patients cardiac history dates back to 2016 when she was diagnosed with CAD. She had a late presenting inferior MI. At that time she underwent cardiac catheterization demonstrating chronic occlusion of the right coronary artery with mild to moderate nonobstructive disease involving the LAD and left circumflex. Medical therapy was recommended. She has been noted to have severe, presumably ischemic mitral regurgitation dating back to 2020. Review of outside records through care everywhere suggests mild to moderate mitral regurgitation by echo in December 2018. LVEF was previously preserved at 55 to 60%.  More recent echo  studies have demonstrated mild to moderate LV dysfunction with LVEF in the 45 to 50% range with hypokinesis of the entire inferior wall. Of note, there have been several notations of consideration of MitraClip previously. The patient had not been inclined to pursue invasive therapies in the setting of her advanced age and breast cancer.  She had done well clinically until 05/2019 when she was hospitalized with acute heart failure. She was found to have paroxysmal atrial fibrillation complicated by symptomatic posttermination pauses with tachybradycardia syndrome.  The patient was started on amiodarone during that admission.  She was readmitted later that same month with ventricular tachycardia and syncope felt to be related to prolonged QT on amiodarone.  She underwent implantation of a dual-chamber ICD Jun 20, 2019 by Dr. Lovena Le.    She developed recurrent atrial fibrillation with RVR and was rehospitalized in early 10/2019 for afib and acute CHF. During her stay, she was cardioverted x3. She was started on IV diltiazem and continued to go intermittently in and out of atrial fibrillation. She finally converted back to sinus rhythm with IV diltiazem. She had been previously on Eliquis 2.5 mg twice daily which was continued without interruption.  It was felt she could not be on amiodarone due to prior VT on amiodarone and QT prolongation. An echocardiogram again demonstrated moderately severe mitral regurgitation and [name] saw her in consultation for consideration of TEER. The patient was willing to proceed with work up including TEE and potential L/RHC, but wanted to be discharged and defer to outpatient setting.   Outpatient TEE was performed 11/02/2019 which showed LVEF at 45-50%, mildly reduced RV function, severely dilated LA, no left atrial/left atrial  appendage thrombus, severely dilated RA, moderate pericardial effusion, prolapse of A2 scallop with severe, eccentric mitral valve regurgitation, mild to  moderate tricuspid regurgitation, moderate grade 3 atheroma plaque involving the transverse and descending aorta. There was prolapse of A2 scallop with severe, eccentric mitral valve regurgitation and multiple jets (dominant jet arises at A2 P2 scallop: ERO 0.18, RV 24 mL). She was felt to have degenerative MR with Carpentier type II primary mitral valve regurgitation.   Unfortunately, she re-presented to [facility] the following day on 11/03/19 with shortness of breath and palpitations and found to be in recurrent afib with RVR (HR 130s).  She was started on IV diltiazem with moderate control. CXR showed left-sided pleural effusion. BNP elevated at 956.  BUN and creatinine elevated at 84 and 2.5 which appears to be slightly higher than her baseline. She was transferred to [facility] for inpatient admission.   The structural heart team is consulted for ongoing discussion about TEER. She is seen sitting up at the edge of the bed. Feeling better with HR better controlled. Says she was feeling the best she had in a long time after her ICD placement. Now most of her symptoms come from afib. She denies any recent chest pain. She walks with a walker/cane and can only travel short distances without getting dyspnea. She has fatigue limiting her activities daily. She denies recurrent syncope since ICD placement.    Past Medical History:  Diagnosis Date  . Acute CHF (congestive heart failure) ([place]) 05/25/2019  . Anemia associated with stage 4 chronic renal failure ([place]) 05/17/2019  . Aortic stenosis 02/14/2019  . Arthritis   . CAD (coronary artery disease) 08/16/2018  . Chronic kidney disease   . Chronic systolic dysfunction of left ventricle   . CKD stage G4/A1, GFR 15-29 and albumin creatinine ratio <30 mg/g (HCC) 05/17/2019  . Coronary artery disease   . Diabetes mellitus without complication ([place])   . Diverticulitis   . History of MI (myocardial infarction) 08/16/2018  . HTN (hypertension) 08/16/2018  . Hyperlipidemia  associated with type 2 diabetes mellitus ([place]) 08/16/2018  . Hypertension   . Hypothyroid 08/16/2018  . Hypothyroidism   . ICD (implantable cardioverter-defibrillator) in place 07/06/2019  . Malignant neoplasm of upper-outer quadrant of left breast in female, estrogen receptor positive ([place]) 03/02/2019  . Mitral regurgitation moderate to severe based on echocardiogram in summer 2020 11/04/2018  . Myocardial infarction ([place])   . Pacemaker 06/29/2019  . Paroxysmal atrial fibrillation (HCC)   . Pleural effusion on left 06/29/2019  . Sinus bradycardia 06/17/2019  . Thyroid disease   . Transaminitis 06/17/2019  . Type 2 diabetes mellitus with other circulatory complications ([place]) 9/38/1829  . V-tach [facility]) 06/17/2019    Past Surgical History:  Procedure Laterality Date  . APPENDECTOMY  1936  . BUBBLE STUDY  11/02/2019   Procedure: BUBBLE STUDY;  Surgeon: [name], MD;  Location: [facility];  Service: Cardiology;;  . ICD IMPLANT N/A 06/20/2019   Procedure: ICD IMPLANT;  Surgeon: Evans Lance, MD;  Location: [place] CV [facility];  Service: Cardiovascular;  Laterality: N/A;  . IR THORACENTESIS ASP PLEURAL SPACE W/IMG GUIDE  05/27/2019  . PARS PLANA VITRECTOMY Right 12/04/2018   Procedure: RETINAL DETACHMENT REPAIR PPV 25 GAUGE WITH ENDO LASER AIR/FLUID EXCHANGE SF6 GAS INJECTION;  Surgeon: Jalene Mullet, MD;  Location: [place];  Service: Ophthalmology;  Laterality: Right;  . TEE WITHOUT CARDIOVERSION N/A 11/02/2019   Procedure: TRANSESOPHAGEAL ECHOCARDIOGRAM (TEE);  Surgeon: [name],  MD;  Location: [place];  Service: Cardiology;  Laterality: N/A;  . TONSILLECTOMY       Inpatient Medications: Scheduled Meds: . apixaban  2.5 mg Oral BID  . atorvastatin  40 mg Oral Daily  . influenza vaccine adjuvanted  0.5 mL Intramuscular Tomorrow-1000  . isosorbide dinitrate  40 mg Oral BID  . levothyroxine  150 mcg Oral QAC breakfast  . melatonin  3 mg Oral QHS   Continuous  Infusions: . diltiazem (CARDIZEM) infusion 10 mg/hr (11/04/19 0004)   PRN Meds:   Allergies:    Allergies  Allergen Reactions  . Amiodarone Other (See Comments)    Prolonged QT, VT  . Aranesp (Albumin Free) [Darbepoetin Alfa] Other (See Comments)    Patient had cardiac arrest evening after receiving this  . Tape Itching, Rash and Other (See Comments)    Reaction was from the surgical tape from cyst removed  . Latex Itching    Social History:   Social History   Socioeconomic History  . Marital status: Married    Spouse name: Not on file  . Number of children: Not on file  . Years of education: Not on file  . Highest education level: Not on file  Occupational History  . Not on file  Tobacco Use  . Smoking status: Former Research scientist (life sciences)  . Smokeless tobacco: Never Used  Vaping Use  . Vaping Use: Never used  Substance and Sexual Activity  . Alcohol use: Yes    Comment: Very rare  . Drug use: Not Currently  . Sexual activity: Not Currently  Other Topics Concern  . Not on file  Social History Narrative  . Not on file   Social Determinants of Health   Financial Resource Strain:   . Difficulty of Paying Living Expenses: Not on file  Food Insecurity:   . Worried About Charity fundraiser in the Last Year: Not on file  . Ran Out of Food in the Last Year: Not on file  Transportation Needs:   . Lack of Transportation (Medical): Not on file  . Lack of Transportation (Non-Medical): Not on file  Physical Activity:   . Days of Exercise per Week: Not on file  . Minutes of Exercise per Session: Not on file  Stress:   . Feeling of Stress : Not on file  Social Connections:   . Frequency of Communication with Friends and Family: Not on file  . Frequency of Social Gatherings with Friends and Family: Not on file  . Attends Religious Services: Not on file  . Active Member of Clubs or Organizations: Not on file  . Attends Archivist Meetings: Not on file  . Marital Status:  Not on file  Intimate Partner Violence:   . Fear of Current or Ex-Partner: Not on file  . Emotionally Abused: Not on file  . Physically Abused: Not on file  . Sexually Abused: Not on file    Family History:   The patient's family history includes Arthritis in her daughter, father, mother, and son; Depression in her maternal aunt; Heart attack in her father; Heart disease in her father and mother; Hyperlipidemia in her maternal aunt; Hypertension in her maternal aunt and mother.  ROS:  Please see the history of present illness.  ROS  All other ROS reviewed and negative.     Physical Exam/Data:   Vitals:   11/04/19 0520 11/04/19 0804 11/04/19 0916 11/04/19 0920  BP:  116/67  (!) 149/59  Pulse: (!) 126  64 72 68  Resp:  20    Temp:  97.8 F (36.6 C)    TempSrc:  Oral    SpO2: 97% 96% 99% 95%  Weight: 76.7 kg     Height:        Intake/Output Summary (Last 24 hours) at 11/04/2019 0958 Last data filed at 11/04/2019 0600 Gross per 24 hour  Intake 171.34 ml  Output 450 ml  Net -278.66 ml   Filed Weights   11/03/19 1649 11/04/19 0520  Weight: 77 kg 76.7 kg   Body mass index is 29.01 kg/m.  General:  Well nourished, well developed, in no acute distress, sitting up at bedside HEENT: normal Lymph: no adenopathy Neck: no JVD Endocrine:  No thryomegaly Cardiac:  normal S1, S2; RRR; 2/6 holosystolic murmur at apex Lungs:  Decreased on left side and at bases Abd: soft, nontender, no hepatomegaly  Ext: 1+ bilateral LE edmma Musculoskeletal:  No deformities, BUE and BLE strength normal and equal Skin: warm and dry  Neuro:  CNs 2-12 intact, no focal abnormalities noted Psych:  Normal affect   EKG:  The EKG was personally reviewed and demonstrates:  Afib, ILBBB HR 134 Telemetry:  Telemetry was personally reviewed and demonstrates:  afib with HRs 98-110s  Relevant CV Studies: Echo 10/22/19 IMPRESSIONS 1. Severe mitral valve regurgitation. Moderate restriction of posterior   mitral leaflet, with posteriorly directed eccentric jet. Secondary mitral  regurgitation, likely Carpentier type IIIB. Quantitation by PISA suggests  moderate regurgitation however  there is splaying of color flow Doppler and the dominant jet demonstrates  Coanda effect. These finding in combination suggest severe mitral valve  regurgitation. The mitral valve is grossly normal. Severe mitral valve  regurgitation.  2. Left ventricular ejection fraction, by estimation, is 45 to 50%. The  left ventricle has mildly decreased function. The left ventricle  demonstrates regional wall motion abnormalities (see scoring  diagram/findings for description).  3. Right ventricular systolic function is moderately reduced.  4. Left atrial size was severely dilated.  5. Aortic valve regurgitation is trivial.  6. The inferior vena cava is dilated in size with <50% respiratory  variability, suggesting right atrial pressure of 15 mmHg.   Comparison(s): A prior study was performed on 05/25/19. No significant  change from prior study. Prior images reviewed side by side.   FINDINGS  Left Ventricle: Left ventricular ejection fraction, by estimation, is 45  to 50%. The left ventricle has mildly decreased function. The left  ventricle demonstrates regional wall motion abnormalities.     LV Wall Scoring:  The basal inferior segment is akinetic. The mid and distal inferior wall  is  hypokinetic.   Right Ventricle: Right ventricular systolic function is moderately  reduced.   Left Atrium: Left atrial size was severely dilated.   Pericardium: A small pericardial effusion is present.   Mitral Valve: Severe mitral valve regurgitation. Moderate restriction of  posterior mitral leaflet, with posteriorly directed eccentric jet.  Secondary mitral regurgitation, likely Carpentier type IIIB. Quantitation  by PISA suggests moderate regurgitation  however there is splaying of color flow Doppler and the  dominant jet  demonstrates Coanda effect. These finding in combination suggest severe  mitral valve regurgitation. The mitral valve is grossly normal. Severe  mitral valve regurgitation.   Aortic Valve: Aortic valve regurgitation is trivial. There is moderate  calcification of the aortic valve.   Venous: A systolic blunting flow pattern is recorded from the right lower  pulmonary vein. The inferior vena cava is  dilated in size with less than  50% respiratory variability, suggesting right atrial pressure of 15 mmHg.   LEFT VENTRICLE  PLAX 2D  LVOT diam:   1.70 cm  LVOT Area:   2.27 cm     LEFT ATRIUM       Index  LA Vol (A2C):  116.0 ml 64.51 ml/m  LA Vol (A4C):  106.0 ml 58.95 ml/m  LA Biplane Vol: 113.0 ml 62.84 ml/m  MR Peak grad:  126.6 mmHg  MR Mean grad:  87.0 mmHg  SHUNTS  MR Vmax:     562.50 cm/s Systemic Diam: 1.70 cm  MR Vmean:    443.5 cm/s  MR PISA:     2.26 cm  MR PISA Eff ROA: 13 mm  MR PISA Radius: 0.60 cm   Lauren Kaiser MD  Electronically signed by Lauren Kaiser MD  Signature Date/Time: 10/22/2019/6:16:48 PM     ____________________   TEE 11/02/19 IMPRESSIONS 1. Left ventricular ejection fraction, by estimation, is 50%. The left  ventricle has low normal function.  2. Right ventricular systolic function is mildly reduced. The right  ventricular size is normal.  3. Left atrial size was severely dilated. No left atrial/left atrial  appendage thrombus was detected. The LAA emptying velocity was 37 cm/s.  4. Right atrial size was severely dilated.  5. Moderate pericardial effusion.  6. Prolapse of A2 scallop with severe, eccentric mitral valve  regurgitation. Multiple jets. Dominant jet arises at A2 P2 scallop.  Dominant jet ERO 0.18, RV 24 mL.   Transcatheter Edge to Edge repair measurements:   PMVL length 9 mm   Flail gap 5 mm   Flail width 5 mm   Coaptation depth 8 mm   Coaptation  length 3.4 mm   No significant leaflet calcifications. The mitral valve is  degenerative. Findings suggest Carpentier type II primary mitral valve  regurgitation.  7. Tricuspid valve regurgitation is mild to moderate.  8. The aortic valve is calcified. There is moderate calcification of the  aortic valve. Aortic valve regurgitation is trivial.  9. There is Moderate (Grade III) atheroma plaque involving the transverse  and descending aorta.  10. Agitated saline contrast bubble study was negative, with no evidence  of any interatrial shunt.   FINDINGS  Left Ventricle: Left ventricular ejection fraction, by estimation, is  50%. The left ventricle has low normal function. The left ventricular  internal cavity size was normal in size.   Right Ventricle: The right ventricular size is normal. No increase in  right ventricular wall thickness. Right ventricular systolic function is  mildly reduced.   Left Atrium: Left atrial size was severely dilated. No left atrial/left  atrial appendage thrombus was detected. The LAA emptying velocity was 37  cm/s.   Right Atrium: Right atrial size was severely dilated.   Pericardium: A moderately sized pericardial effusion is present.   Mitral Valve: Prolapse of A2 scallop with severe, eccentric mitral valve  regurgitation. Multiple jets. Dominant jet arises at A2 P2 scallop.  Dominant jet ERO 0.18, RV 24 mL.  Transcatheter Edge to Edge repair measurements:  PMVL length 9 mm  Flail gap 5 mm  Flail width 5 mm  Coaptation depth 8 mm  Coaptation length 3.4 mm  No significant leaflet calcifications. The mitral valve is degenerative in  appearance. Findings suggest Carpentier type II primary mitral valve  regurgitation. The mean mitral valve gradient is 2.4 mmHg with average  heart rate of 95 bpm.   Tricuspid Valve:  The tricuspid valve is normal in structure. Tricuspid  valve regurgitation is mild to moderate.   Aortic Valve: The aortic  valve is calcified. There is moderate  calcification of the aortic valve. Aortic valve regurgitation is trivial.   Pulmonic Valve: The pulmonic valve was normal in structure. Pulmonic valve  regurgitation is trivial.   Aorta: The aortic root and ascending aorta are structurally normal, with  no evidence of dilitation. There is moderate (Grade III) atheroma plaque  involving the transverse and descending aorta.   IAS/Shunts: No atrial level shunt detected by color flow Doppler. Agitated  saline contrast was given intravenously to evaluate for intracardiac  shunting. Agitated saline contrast bubble study was negative, with no  evidence of any interatrial shunt.   Additional Comments: A pacer wire is visualized in the right atrium and  right ventricle.     MITRAL VALVE  MV Mean grad: 2.4 mmHg  MR Peak grad:  85.4 mmHg  MR Mean grad:  57.0 mmHg  MR Vmax:    462.00 cm/s  MR Vmean:    360.0 cm/s  MR PISA:    2.26 cm  MR PISA Radius: 0.60 cm   Laboratory Data:  Chemistry Recent Labs  Lab 11/03/19 1115  NA 133*  K 4.3  CL 96*  CO2 24  GLUCOSE 302*  BUN 84*  CREATININE 2.50*  CALCIUM 8.6*  GFRNONAA 17*  GFRAA 19*  ANIONGAP 13    Recent Labs  Lab 11/03/19 1115  PROT 6.7  ALBUMIN 3.2*  AST 16  ALT 16  ALKPHOS 58  BILITOT 0.5   Hematology Recent Labs  Lab 11/03/19 1115  WBC 14.2*  RBC 3.13*  HGB 9.0*  HCT 28.5*  MCV 91.1  MCH 28.8  MCHC 31.6  RDW 13.6  PLT 520*   Cardiac EnzymesNo results for input(s): TROPONINI in the last 168 hours. No results for input(s): TROPIPOC in the last 168 hours.  BNP Recent Labs  Lab 11/03/19 1115  BNP 956.3*    DDimer No results for input(s): DDIMER in the last 168 hours.  Radiology/Studies:  DG Chest 2 View  Result Date: 11/03/2019 CLINICAL DATA:  Shortness of breath. EXAM: CHEST - 2 VIEW COMPARISON:  October 20, 2019. FINDINGS: Stable cardiomegaly. Right-sided pacemaker is unchanged in position. No  pneumothorax is noted. Right lung is clear. Moderate left pleural effusion is noted with probable underlying atelectasis or infiltrate. Bony thorax is unremarkable. IMPRESSION: Moderate left pleural effusion with probable underlying atelectasis or infiltrate. Aortic Atherosclerosis (ICD10-I70.0). Electronically Signed   By: Marijo Conception M.D.   On: 11/03/2019 11:40   ECHO TEE  Result Date: 11/03/2019    TRANSESOPHOGEAL ECHO REPORT   Patient Name:   Lauren Walker Date of Exam: 11/02/2019 Medical Rec #:  [id number]      Height:       64.0 in Accession #:    [id number]     Weight:       167.0 lb Date of Birth:  [birth year]      BSA:          1.812 m Patient Age:    35 years       BP:           128/89 mmHg Patient Gender: F              HR:           105 bpm. Exam Location:  Inpatient Procedure: Transesophageal Echo, 3D Echo, Color Doppler  and Cardiac Doppler                               MODIFIED REPORT: This report was modified by Lauren Kaiser MD on 11/03/2019 due to revision.  Indications:     Mitral Regurgitation  History:         Patient has prior history of Echocardiogram examinations, most                  recent 10/22/2019. CHF, Previous Myocardial Infarction and CAD,                  Pacemaker, Arrythmias:Atrial Fibrillation; Risk                  Factors:Diabetes and Hypertension.  Sonographer:     Mikki Santee RDCS (AE) Referring Phys:  [id number] RHONDA G BARRETT Diagnosing Phys: Lauren Kaiser MD PROCEDURE: After discussion of the risks and benefits of a TEE, an informed consent was obtained from the patient. TEE procedure time was 31 minutes. The transesophogeal probe was passed without difficulty through the esophogus of the patient. Imaged were obtained with the patient in a left lateral decubitus position. Local oropharyngeal anesthetic was provided with Cetacaine. Sedation performed by different physician. The patient was monitored while under deep sedation. Anesthestetic sedation was provided  intravenously by Anesthesiology: 179.71mg  of Propofol, 100mg  of Lidocaine. Image quality was good. The patient's vital signs; including heart rate, blood pressure, and oxygen saturation; remained stable throughout the procedure. The patient developed no complications during the procedure. IMPRESSIONS  1. Left ventricular ejection fraction, by estimation, is 50%. The left ventricle has low normal function.  2. Right ventricular systolic function is mildly reduced. The right ventricular size is normal.  3. Left atrial size was severely dilated. No left atrial/left atrial appendage thrombus was detected. The LAA emptying velocity was 37 cm/s.  4. Right atrial size was severely dilated.  5. Moderate pericardial effusion.  6. Prolapse of A2 scallop with severe, eccentric mitral valve regurgitation. Multiple jets. Dominant jet arises at A2 P2 scallop. Dominant jet ERO 0.18, RV 24 mL.     Transcatheter Edge to Edge repair measurements:     PMVL length 9 mm     Flail gap 5 mm     Flail width 5 mm     Coaptation depth 8 mm     Coaptation length 3.4 mm     No significant leaflet calcifications. The mitral valve is degenerative. Findings suggest Carpentier type II primary mitral valve regurgitation.  7. Tricuspid valve regurgitation is mild to moderate.  8. The aortic valve is calcified. There is moderate calcification of the aortic valve. Aortic valve regurgitation is trivial.  9. There is Moderate (Grade III) atheroma plaque involving the transverse and descending aorta. 10. Agitated saline contrast bubble study was negative, with no evidence of any interatrial shunt. FINDINGS  Left Ventricle: Left ventricular ejection fraction, by estimation, is 50%. The left ventricle has low normal function. The left ventricular internal cavity size was normal in size. Right Ventricle: The right ventricular size is normal. No increase in right ventricular wall thickness. Right ventricular systolic function is mildly reduced. Left Atrium:  Left atrial size was severely dilated. No left atrial/left atrial appendage thrombus was detected. The LAA emptying velocity was 37 cm/s. Right Atrium: Right atrial size was severely dilated. Pericardium: A moderately sized pericardial effusion is present. Mitral Valve: Prolapse of A2 scallop  with severe, eccentric mitral valve regurgitation. Multiple jets. Dominant jet arises at A2 P2 scallop. Dominant jet ERO 0.18, RV 24 mL. Transcatheter Edge to Edge repair measurements: PMVL length 9 mm Flail gap 5 mm Flail width 5 mm Coaptation depth 8 mm Coaptation length 3.4 mm No significant leaflet calcifications. The mitral valve is degenerative in appearance. Findings suggest Carpentier type II primary mitral valve regurgitation. The mean mitral valve gradient is 2.4 mmHg with average heart rate of 95 bpm. Tricuspid Valve: The tricuspid valve is normal in structure. Tricuspid valve regurgitation is mild to moderate. Aortic Valve: The aortic valve is calcified. There is moderate calcification of the aortic valve. Aortic valve regurgitation is trivial. Pulmonic Valve: The pulmonic valve was normal in structure. Pulmonic valve regurgitation is trivial. Aorta: The aortic root and ascending aorta are structurally normal, with no evidence of dilitation. There is moderate (Grade III) atheroma plaque involving the transverse and descending aorta. IAS/Shunts: No atrial level shunt detected by color flow Doppler. Agitated saline contrast was given intravenously to evaluate for intracardiac shunting. Agitated saline contrast bubble study was negative, with no evidence of any interatrial shunt. Additional Comments: A pacer wire is visualized in the right atrium and right ventricle.  MITRAL VALVE MV Mean grad: 2.4 mmHg MR Peak grad:   85.4 mmHg MR Mean grad:   57.0 mmHg MR Vmax:        462.00 cm/s MR Vmean:       360.0 cm/s MR PISA:        2.26 cm MR PISA Radius: 0.60 cm Lauren Kaiser MD Electronically signed by Lauren Kaiser MD  Signature Date/Time: 11/03/2019/3:11:56 PM    Final (Updated)    CUP PACEART REMOTE DEVICE CHECK  Result Date: 11/03/2019 Scheduled remote reviewed. Normal device function.  Persistent AF with RVR, on OAC, there is not good ventricular rate control as heart rates are 110-140 bpm.  Sent to triage. Next remote 91 days. Kathy Breach, RN, CCDS, [employer] Scheduled remote reviewed. Normal device function.  Persistent AF with RVR, on OAC, there is not good ventricular rate control as heart rates are 110-140 bpm.  Sent to triage. Next remote 91 days. Kathy Breach, RN, CCDS, [employer]    STS Risk Calculator:  STS RISK CALCULATOR: Isolated mitral valve replacement: Risk of Mortality: 12.913% Renal Failure: 17.181% Permanent Stroke: 5.431% Prolonged Ventilation: 21.209% DSW Infection: 0.099% Reoperation: 7.636% Morbidity or Mortality: 43.459% Short Length of Stay: 5.583% Long Length of Stay: 28.514%  _____________  Isolated Mitral valve repair: Risk of Mortality: 10.520% Renal Failure: 23.765% Permanent Stroke: 1.656% Prolonged Ventilation: 18.343% DSW Infection: 0.051% Reoperation: 4.585% Morbidity or Mortality: 35.046% Short Length of Stay: 7.463% Long Length of Stay: 32.525%   ________________   [facility] Cardiomyopathy Questionnaire  KCCQ-12 11/04/2019  1 a. Ability to shower/bathe Slightly limited  1 b. Ability to walk 1 block Moderately limited  1 c. Ability to hurry/jog Other, Did not do  2. Edema feet/ankles/legs Every morning  3. Limited by fatigue All of the time  4. Limited by dyspnea All of the time  5. Sitting up / on 3+ pillows 1-2 times a week  6. Limited enjoyment of life Extremely limited  7. Rest of life w/ symptoms Not at all satisfied  8 a. Participation in hobbies Severely limited  8 b. Participation in chores Severely limited  8 c. Visiting family/friends Severely limited      Assessment and Plan:   Lauren Walker  Walker is a 86 y.o. female with symptoms of severe, stage D mitral regurgitation with NYHA Class IV symptoms currently admitted with acute CHF and afib with RVR. Her symptoms seem to be mostly driven by afib. I have reviewed the patient's recent echocardiogram which is notable for mildly reduced LV systolic function (EF 82-99%) and severe mitral valve regurgitation. There was moderate restriction of posterior mitral leaflet, with posteriorly directed eccentric jet and secondary mitral regurgitation, likely Carpentier type IIIB. TEE 11/03/19 showed LVEF at 45-50%, mildly reduced RV function, severely dilated LA, no left atrial/left atrial appendage thrombus, severely dilated RA, moderate pericardial effusion, prolapse of A2 scallop with severe, eccentric mitral valve regurgitation, mild to moderate tricuspid regurgitation, moderate grade 3 atheroma plaque involving the transverse and descending aorta. There was prolapse of A2 scallop with severe, eccentric mitral valve regurgitation and multiple jets (dominant jet arises at A2 P2 scallop: ERO 0.18, RV 24 mL). She was felt to have degenerative MR with Carpentier type II primary mitral valve regurgitation.   Patient has known CAD remote inferior wall MI. She has not had any recent chest pain and has stage IV CKD with a GFR of 17. Will likely plan on deferring cornary angiography.   I have reviewed the natural history of mitral regurgitation with the patient. We have discussed the limitations of medical therapy and the poor prognosis associated with symptomatic mitral regurgitation. We have also reviewed potential treatment options, including palliative medical therapy, conventional surgical mitral valve repair or replacement, and percutaneous mitral valve repair with MitraClip. We discussed treatment options in the context of this patient's specific comorbid medical conditions.    The patient's predicted risk of mortality with conventional mitral valve  replacement/repair is 12.913 % / 10.520 % respectively, primarily based on advanced age, CKD, acute CHF with reduced EF, obesity, CAD, afib and VT s/p ICD. Other significant comorbid conditions physical deconditioning. She is mostly limited by afib with RVR which is almost certainly driven by here severe MR .Unfortunaltly, she has failed multiple DCCVs and not a candidate for amiodarone given prolonged QT and VT. EP consultation requested to see if there are any other antiarrythmic options for her. Will discuss with multidisciplinary valve team including Dr. Roxy Manns with cardiothoracic surgery given severe MR with mixed etiology.    [name] to follow.   Signed, Lauren Form, PA-C  11/04/2019 9:58 AM   Patient seen, examined. Available data reviewed. Agree with findings, assessment, and plan as outlined by Nell Range, PA-C.  The patient is independently interviewed and examined.  The patient is sitting up on the bedside.  She is alert, oriented, in no distress.  JVP is normal, lung fields are diminished in the bases but otherwise clear, heart is irregularly irregular with a 2/6 harsh systolic murmur at the right upper sternal border (mid peaking) and a 3/6 holosystolic murmur at the apex.  Abdomen is soft and nontender, extremities have trace edema.  Skin is warm and dry without rash.  Neurologic is grossly intact.    I initially saw the patient October 24, 2019 in consultation for evaluation of severe mitral regurgitation.  At that time she was hospitalized for atrial fibrillation with RVR and heart failure.  She has ischemic heart disease with history of an inferior MI in 2016 not revascularized because of late clinical presentation.  She has been followed for severe mitral regurgitation but has been reluctant to pursue any therapy because of her advanced age.  Throughout the last several months she  has had problems with atrial fibrillation with RVR.  She was treated with amiodarone but later  developed ventricular tachycardia with concern about QT prolongation.  Amiodarone was discontinued and she underwent dual-chamber ICD implantation Jun 20, 2019.  She was rehospitalized in early September with recurrent atrial fibrillation and congestive heart failure.  At the time of my initial assessment during that hospitalization, I recommended outpatient transesophageal echo with planned follow-up in valve clinic to move forward with transcatheter edge-to-edge mitral valve repair.  Unfortunately the patient has developed recurrent atrial fibrillation with RVR and again presents with signs and symptoms of decompensated congestive heart failure.  She has now undergone transesophageal echo which is outlined above.  This demonstrates what is most likely mixed etiology mitral regurgitation with posterior leaflet restriction and also prolapse of the A2 scallop.  There is severe eccentric mitral regurgitation present.  I have personally reviewed the TEE images.  She is also noted to have a moderate pericardial effusion.  I suspect that severe mitral regurgitation is contributing to her refractory atrial fibrillation as well as her heart failure.   I have discussed her case with both Dr. Marlou Porch and Dr. Lovena Le.  The patient's husband is at the bedside at the time of my evaluation today.  Plans are noted to initiate IV amiodarone and repeat cardioversion on Monday.  The patient will then be discharged home presuming she is able to maintain sinus rhythm.  I think she would likely benefit from transcatheter edge-to-edge mitral valve repair.  We would normally evaluate her with preoperative cardiac catheterization, especially in the setting of her prior MI.  However, with stage IV chronic kidney disease, I think the risk of contrast administration likely outweighs any potential benefit.  She is not having any anginal symptoms and it is unlikely that revascularization would impact her outcome.   I have reviewed the MitraClip  procedure again with the patient and her husband.  We have discussed risks, indications, and alternative treatments.  She is clearly failing medical therapy with repeated hospitalizations over the last several months.  When I see her back in the outpatient setting, I will plan to repeat a limited 2D echocardiogram to make sure that her pericardial effusion is not enlarging.  As part of her preoperative evaluation, I am going to request formal cardiac surgical consultation with Dr Roxy Manns as part of a multidisciplinary approach to her care.  Our team will follow up on Monday.  Lauren Walker, M.D. 11/04/2019 1:34 PM

## 2019-11-04 NOTE — Consult Note (Addendum)
Cardiology Consultation:   Patient ID: [name] MRN: [id number]; DOB: [birth year]  Admit date: 11/03/2019 Date of Consult: 11/04/2019  Primary Care Provider: Midge Minium, MD [facility] Cardiologist: Jenne Campus, MD  [facility] Electrophysiologist:  Dr. Lovena Le   Patient Profile:   Lauren Walker is a 86 y.o. female with a hx of breast Ca (hormonal tx), chronic CHF (combined), CAD (chronic occlusion of the RCA with mild to moderate nonobstructive disease involving the LAD and left circumflex per [facility] 2016), CKD (IV), HTN, HLD, hypothyroidism, VHD w/MR, AFib who is being seen today for the evaluation of difficult to control AF at the request of Dr. Marlou Porch.   Device information SJm dual chamber ICD implanted 06/20/2019  She had a hospital stay in April 2021noted to have long post termination pause (after IV lopressor) and started on amiodarone in attempt to maintain SR and avoid pacing.  May 2021 suffered a syncopal event EMS found her in VT gave her amio bolus and shocked her Her QT found markedly prolonged and her amiodarone stopped she was also bradycardic to rates 20's-30'sand ICD implanted this admissin  History of Present Illness:   Ms. [name] was recently hospitalized 10/21/19 for AFib w/RVR underwent DCCV (shocked x3 at [facility]) notes suggest ERAF going "in and out of AFib". TTE noted severe MR, was evaluated by [name] for possible mitra clip and planned to pursue work up towards this. While in the hospital she did spontaneously convert to SR and transitioned to PO dilt, her ACE stopped with increased Creat. Discharged 10/24/19  At Dr. Quintella Reichert post hospital follow up visit 10/26/19 the patient feeling fatigued, back in AFib 130's and her diltiazem increased planned to keep her f/u and TEE with structural heart team.  TEE was performed 11/02/2019 with LVEF at 50%, mildly reduced RV function, severely dilated LA, no left atrial/left atrial appendage thrombus, severely  dilated RA, moderate pericardial effusion,prolapse of A2 scallop with severe, eccentric mitral valve regurgitation, mild to moderate tricuspid regurgitation, moderate grade 3 atheroma plaque involving the transverse and descending aorta.   She was readmitted via [facility] to [facility] yesterday 2/2 RVR started on dilt gtt and volume OL, started on IV lasix, structural team consulted and today EP to help with rate vs rhythm control options.  LABS (yesterday) K+ 4.3 BUN/Creat 84/2.50 (baseline 1.9-2.3) BNP 956 HS Trop 19, 20 WBC 14.2 H/H 9/28 Plts 520  05/25/19 TSH 1.130  COVID: negative  Home rate controlling meds:  diltiazem 180mg  QD (recently increased) Lopressor 25mg  BID  Rate controlling meds here Diltiazem gtt (10mg /hr currently)   The patient feels better in SR by her observation.  She can tell the difference and has better energy, breaths better in SR. She is feeling a bit better today, less SOB, remains aware of her heart rhythm No CP   Past Medical History:  Diagnosis Date   Acute CHF (congestive heart failure) ([place]) 05/25/2019   Anemia associated with stage 4 chronic renal failure ([place]) 05/17/2019   Aortic stenosis 02/14/2019   Arthritis    CAD (coronary artery disease) 08/16/2018   Chronic kidney disease    Chronic systolic dysfunction of left ventricle    CKD stage G4/A1, GFR 15-29 and albumin creatinine ratio <30 mg/g ([place]) 05/17/2019   Coronary artery disease    Diabetes mellitus without complication (HCC)    Diverticulitis    History of MI (myocardial infarction) 08/16/2018   HTN (hypertension) 08/16/2018   Hyperlipidemia associated with type 2 diabetes  mellitus ([place]) 08/16/2018   Hypertension    Hypothyroid 08/16/2018   Hypothyroidism    ICD (implantable cardioverter-defibrillator) in place 07/06/2019   Malignant neoplasm of upper-outer quadrant of left breast in female, estrogen receptor positive ([place]) 03/02/2019   Mitral regurgitation moderate to severe based on  echocardiogram in summer 2020 11/04/2018   Myocardial infarction [facility])    Pacemaker 06/29/2019   Paroxysmal atrial fibrillation (HCC)    Pleural effusion on left 06/29/2019   Sinus bradycardia 06/17/2019   Thyroid disease    Transaminitis 06/17/2019   Type 2 diabetes mellitus with other circulatory complications ([place]) 1/66/0630   V-tach ([place]) 06/17/2019    Past Surgical History:  Procedure Laterality Date   APPENDECTOMY  1936   BUBBLE STUDY  11/02/2019   Procedure: BUBBLE STUDY;  Surgeon: [name], MD;  Location: [place];  Service: Cardiology;;   ICD IMPLANT N/A 06/20/2019   Procedure: ICD IMPLANT;  Surgeon: Evans Lance, MD;  Location: [place] CV LAB;  Service: Cardiovascular;  Laterality: N/A;   IR THORACENTESIS ASP PLEURAL SPACE W/IMG GUIDE  05/27/2019   PARS PLANA VITRECTOMY Right 12/04/2018   Procedure: RETINAL DETACHMENT REPAIR PPV 25 GAUGE WITH ENDO LASER AIR/FLUID EXCHANGE SF6 GAS INJECTION;  Surgeon: Jalene Mullet, MD;  Location: [place];  Service: Ophthalmology;  Laterality: Right;   TEE WITHOUT CARDIOVERSION N/A 11/02/2019   Procedure: TRANSESOPHAGEAL ECHOCARDIOGRAM (TEE);  Surgeon: [name], MD;  Location: [place];  Service: Cardiology;  Laterality: N/A;   TONSILLECTOMY       Home Medications:  Prior to Admission medications   Medication Sig Start Date End Date Taking? Authorizing Provider  acetaminophen (TYLENOL) 650 MG CR tablet Take 1,300 mg by mouth every 8 (eight) hours as needed for pain.    Yes [provider]  anastrozole (ARIMIDEX) 1 MG tablet TAKE 1 TABLET BY MOUTH EVERY DAY Patient taking differently: Take 1 mg by mouth daily.  09/15/19  Yes Magrinat, Virgie Dad, MD  apixaban (ELIQUIS) 2.5 MG TABS tablet Take 1 tablet (2.5 mg total) by mouth 2 (two) times daily. 03/10/19  Yes Loel Dubonnet, NP  atorvastatin (LIPITOR) 40 MG tablet TAKE 1 TABLET BY MOUTH EVERY DAY Patient taking differently: Take 40 mg by mouth at bedtime.   10/18/19  Yes Midge Minium, MD  b complex vitamins capsule Take 1 capsule by mouth daily.   Yes [provider]  Biotin 1 MG CAPS Take 1 mg by mouth daily.    Yes [provider]  Cholecalciferol (VITAMIN D3) 50 MCG (2000 UT) TABS Take 2,000 Units by mouth at bedtime.    Yes [provider]  diltiazem (CARDIZEM CD) 180 MG 24 hr capsule Take 1 capsule (180 mg total) by mouth daily. 10/26/19  Yes Tobb, Kardie, DO  diltiazem (DILT-XR) 120 MG 24 hr capsule Take 120 mg by mouth daily.   Yes [provider]  Ferrous Sulfate (IRON) 325 (65 Fe) MG TABS Take 325 mg by mouth daily with breakfast.    Yes [provider]  fluticasone (FLONASE) 50 MCG/ACT nasal spray Place 1 spray into both nostrils in the morning and at bedtime.   Yes [provider]  isosorbide dinitrate (ISORDIL) 20 MG tablet TAKE 2 TABLETS (40MG ) BY MOUTH TWICE DAILY Patient taking differently: Take 40 mg by mouth 2 (two) times daily.  10/14/19  Yes Midge Minium, MD  levothyroxine (SYNTHROID) 150 MCG tablet TAKE 1 TABLET BY MOUTH DAILY BEFORE BREAKFAST. Patient  taking differently: Take 150 mcg by mouth daily before breakfast.  08/25/19  Yes Midge Minium, MD  magnesium oxide (MAG-OX) 400 MG tablet Take 400 mg by mouth at bedtime.    Yes [provider]  metoprolol tartrate (LOPRESSOR) 25 MG tablet Take 1 tablet (25 mg total) by mouth 2 (two) times daily. 10/24/19  Yes Barrett, Evelene Croon, PA-C  nitroGLYCERIN (NITROSTAT) 0.4 MG SL tablet Place 1 tablet (0.4 mg total) under the tongue every 5 (five) minutes as needed for chest pain. 09/28/19  Yes Midge Minium, MD  torsemide (DEMADEX) 20 MG tablet Take 2 tablets (40 mg total) by mouth 2 (two) times daily. Patient taking differently: Take 20 mg by mouth 2 (two) times daily.  10/26/19 01/24/20 Yes Tobb, Kardie, DO  vitamin C (ASCORBIC ACID) 500 MG tablet Take 500 mg by mouth daily.   Yes [provider]    Azelastine-Fluticasone 137-50 MCG/ACT SUSP Place 1 spray into both nostrils 2 (two) times daily.  Patient not taking: Reported on 11/03/2019    [provider]    Inpatient Medications: Scheduled Meds:  apixaban  2.5 mg Oral BID   atorvastatin  40 mg Oral Daily   influenza vaccine adjuvanted  0.5 mL Intramuscular Tomorrow-1000   isosorbide dinitrate  40 mg Oral BID   levothyroxine  150 mcg Oral QAC breakfast   melatonin  3 mg Oral QHS   Continuous Infusions:  diltiazem (CARDIZEM) infusion 10 mg/hr (11/04/19 0004)   PRN Meds:   Allergies:    Allergies  Allergen Reactions   Amiodarone Other (See Comments)    Prolonged QT, VT   Aranesp (Albumin Free) [Darbepoetin Alfa] Other (See Comments)    Patient had cardiac arrest evening after receiving this   Tape Itching, Rash and Other (See Comments)    Reaction was from the surgical tape from cyst removed   Latex Itching    Social History:   Social History   Socioeconomic History   Marital status: Married    Spouse name: Not on file   Number of children: Not on file   Years of education: Not on file   Highest education level: Not on file  Occupational History   Not on file  Tobacco Use   Smoking status: Former Smoker   Smokeless tobacco: Never Used  Scientific laboratory technician Use: Never used  Substance and Sexual Activity   Alcohol use: Yes    Comment: Very rare   Drug use: Not Currently   Sexual activity: Not Currently  Other Topics Concern   Not on file  Social History Narrative   Not on file   Social Determinants of Health   Financial Resource Strain:    Difficulty of Paying Living Expenses: Not on file  Food Insecurity:    Worried About Charity fundraiser in the Last Year: Not on file   [employer] of Food in the Last Year: Not on file  Transportation Needs:    Lack of Transportation (Medical): Not on file   Lack of Transportation (Non-Medical): Not on file  Physical Activity:    Days of Exercise per  Week: Not on file   Minutes of Exercise per Session: Not on file  Stress:    Feeling of Stress : Not on file  Social Connections:    Frequency of Communication with Friends and Family: Not on file   Frequency of Social Gatherings with Friends and Family: Not on file   Attends Religious  Services: Not on file   Active Member of Clubs or Organizations: Not on file   Attends Archivist Meetings: Not on file   Marital Status: Not on file  Intimate Partner Violence:    Fear of Current or Ex-Partner: Not on file   Emotionally Abused: Not on file   Physically Abused: Not on file   Sexually Abused: Not on file    Family History:   Family History  Problem Relation Age of Onset   Arthritis Mother    Heart disease Mother    Hypertension Mother    Arthritis Father    Heart attack Father    Heart disease Father    Arthritis Daughter    Arthritis Son    Depression Maternal Aunt    Hyperlipidemia Maternal Aunt    Hypertension Maternal Aunt      ROS:  Please see the history of present illness.  All other ROS reviewed and negative.     Physical Exam/Data:   Vitals:   11/04/19 0520 11/04/19 0804 11/04/19 0916 11/04/19 0920  BP:  116/67  (!) 149/59  Pulse: (!) 126 64 72 68  Resp:  20    Temp:  97.8 F (36.6 C)    TempSrc:  Oral    SpO2: 97% 96% 99% 95%  Weight: 76.7 kg     Height:        Intake/Output Summary (Last 24 hours) at 11/04/2019 1106 Last data filed at 11/04/2019 0600 Gross per 24 hour  Intake 171.34 ml  Output 450 ml  Net -278.66 ml   Last 3 Weights 11/04/2019 11/03/2019 11/02/2019  Weight (lbs) 169 lb 169 lb 12.8 oz 167 lb  Weight (kg) 76.658 kg 77.021 kg 75.751 kg     Body mass index is 29.01 kg/m.  General:  Well nourished, well developed, in no acute distress HEENT: normal Lymph: no adenopathy Neck: no JVD Endocrine:  No thryomegaly Vascular: No carotid bruits Cardiac:  irreg-irreg; 1-2/6 SM, no gallops or rubs Lungs:  diminished L to mid lung,  base on the right, no wheezing, rhonchi or rales  Abd: soft, nontender, no hepatomegaly  Ext: no edema Musculoskeletal:  No deformities, BUE and BLE strength normal and equal Skin: warm and dry  Neuro:  no gross focal abnormalities noted Psych:  Normal affect    EKG:  The EKG was personally reviewed and demonstrates:   AF 134bpm   Telemetry:  Telemetry was personally reviewed and demonstrates:   Afib 90's-110's    Relevant CV Studies:  TEE 11/02/19 IMPRESSIONS  1. Left ventricular ejection fraction, by estimation, is 50%. The left  ventricle has low normal function.   2. Right ventricular systolic function is mildly reduced. The right  ventricular size is normal.   3. Left atrial size was severely dilated. No left atrial/left atrial  appendage thrombus was detected. The LAA emptying velocity was 37 cm/s.   4. Right atrial size was severely dilated.   5. Moderate pericardial effusion.   6. Prolapse of A2 scallop with severe, eccentric mitral valve  regurgitation. Multiple jets. Dominant jet arises at A2 P2 scallop.  Dominant jet ERO 0.18, RV 24 mL.      Transcatheter Edge to Edge repair measurements:      PMVL length 9 mm      Flail gap 5 mm      Flail width 5 mm      Coaptation depth 8 mm      Coaptation length 3.4 mm  No significant leaflet calcifications. The mitral valve is  degenerative. Findings suggest Carpentier type II primary mitral valve  regurgitation.   7. Tricuspid valve regurgitation is mild to moderate.   8. The aortic valve is calcified. There is moderate calcification of the  aortic valve. Aortic valve regurgitation is trivial.   9. There is Moderate (Grade III) atheroma plaque involving the transverse  and descending aorta.  10. Agitated saline contrast bubble study was negative, with no evidence  of any interatrial shunt.    05/25/2019: TTE IMPRESSIONS   1. Left ventricular ejection fraction, by estimation, is 45 to 50%. The  left ventricle  has mildly decreased function. The left ventricle  demonstrates regional wall motion abnormalities (see scoring  diagram/findings for description). There is moderate  left ventricular hypertrophy. Left ventricular diastolic parameters are  indeterminate. There is moderate hypokinesis of the left ventricular,  entire inferior wall.   2. Right ventricular systolic function is normal. The right ventricular  size is normal. There is normal pulmonary artery systolic pressure.   3. Left atrial size was severely dilated.   4. Large pleural effusion in the left lateral region.   5. The mitral valve is grossly normal. Moderate to severe mitral valve  regurgitation.   6. The aortic valve is tricuspid. Aortic valve regurgitation is not  visualized. Mild to moderate aortic valve stenosis. Aortic valve area, by  VTI measures 1.49 cm. Aortic valve mean gradient measures 10.4 mmHg.  Aortic valve Vmax measures 2.17 m/s.   7. The inferior vena cava is dilated in size with >50% respiratory  variability, suggesting right atrial pressure of 8 mmHg.   Laboratory Data:  High Sensitivity Troponin:   Recent Labs  Lab 10/20/19 1544 10/20/19 1734 11/03/19 1115 11/03/19 1400  TROPONINIHS 18* 18* 19* 20*     Chemistry Recent Labs  Lab 11/03/19 1115  NA 133*  K 4.3  CL 96*  CO2 24  GLUCOSE 302*  BUN 84*  CREATININE 2.50*  CALCIUM 8.6*  GFRNONAA 17*  GFRAA 19*  ANIONGAP 13    Recent Labs  Lab 11/03/19 1115  PROT 6.7  ALBUMIN 3.2*  AST 16  ALT 16  ALKPHOS 58  BILITOT 0.5   Hematology Recent Labs  Lab 11/03/19 1115  WBC 14.2*  RBC 3.13*  HGB 9.0*  HCT 28.5*  MCV 91.1  MCH 28.8  MCHC 31.6  RDW 13.6  PLT 520*   BNP Recent Labs  Lab 11/03/19 1115  BNP 956.3*    DDimer No results for input(s): DDIMER in the last 168 hours.   Radiology/Studies:   DG Chest 2 View Result Date: 11/03/2019 CLINICAL DATA:  Shortness of breath. EXAM: CHEST - 2 VIEW COMPARISON:  October 20, 2019. FINDINGS: Stable cardiomegaly. Right-sided pacemaker is unchanged in position. No pneumothorax is noted. Right lung is clear. Moderate left pleural effusion is noted with probable underlying atelectasis or infiltrate. Bony thorax is unremarkable. IMPRESSION: Moderate left pleural effusion with probable underlying atelectasis or infiltrate. Aortic Atherosclerosis (ICD10-I70.0). Electronically Signed   By: Marijo Conception M.D.   On: 11/03/2019 11:40     Assessment and Plan:   1. Persistent AFib     CHA2DS2Vasc is 6, on Eliquis, appropriately dosed     tachy-brady, has an ICD in place  Dr. Lovena Le has seen and examined the patient We have reviewed her historical EKG's particularly he amio EKGs and her EMS tracing/VT  Dr. Lovena Le not entirely convinced her VT/QT prolongation was 2/2  the amiodarone QT prolongation may have been 2/2 the VT/Arrest She has no other good AAD options and she feels better in SR and would like to try rhythm control for her  Will start amio gtt (no bolus) to run the weekend and retry DCCV Monday I have discussed the plan with the patient and her husband, they are agreeable  She had her TEE yesterday without thrombus, and has had her Eliquis here since then as well.   2. Acute on chronic CHF (combined)     LVEF by TEE 50% 3. Severe MR     Got a single IV lasix dose so far      I/O  Cumulatively negative only 462ml     C/w primary cardiology team  4. CAD     Felt to be stable  For questions or updates, please contact [place] Please consult [email or web address] for contact info under   Signed, Baldwin [place], PA-C  11/04/2019 11:06 AM  EP Attending Patient seen and examined. Agree with above. Discussed with Dr. Marlou Porch and Burt Knack. She is a very complicated 11OB woman who presented with VT in the setting of amiodarone therapy several months ago. She underwent ICD insertion. Her amio was stopped. She redeveloped atrial fib last month. She has developed  worsening CHF symptoms. In August she was in NSR and felt well. She denies chest pain. In the hospital she has had atrial fib with a RVR. Her rates have been difficult to control. She has not had more VT. Her exam is notable for a pleasant elderly woman, NAD. Lungs reveal rales and egophony. CV reveals an IRIR tachy rhythm. Ext with 1+ edema. Neuro exam appears no focal abnormalities. Tele with atrial fib and a RVR.  A/P Uncontrolled atrial fib - AV node ablation as well as amiodarone retrial both options. As she has QT prolongation, dofetilide not an option and I would recommend a trial of IV amio over the weekend followed by DCCV early next week as long as she does not miss any eliquis.   MR - she is under consideration for MV clipping.  Acute on chronic systolic and diastolic heart failure - her symptoms are class 3. She felt much better when she was in NSR a month ago. We will attempt to get her back in NSR.  Carleene Overlie Deshaun Weisinger,MD

## 2019-11-04 NOTE — Discharge Instructions (Addendum)
Medication Changes: - START Amiodarone 200mg  twice daily. - INCREASE Cardizem CD to 300mg  daily. - STOP Metoprolol. - We have prescribed Ativan to be taken twice a day as needed for anxiety. You will need to follow-up with you PCP for any refills of this.  Continue all other medications as directed elsewhere on discharge summary.  Heart Failure Education: 1. Weigh yourself EVERY morning after you go to the bathroom but before you eat or drink anything. Write this number down in a weight log/diary. If you gain 3 pounds overnight or 5 pounds in a week, call the office. 2. Take your medicines as prescribed. If you have concerns about your medications, please call us before you stop taking them.  3. Eat low salt foods--Limit salt (sodium) to 2000 mg per day. This will help prevent your body from holding onto fluid. Read food labels as many processed foods have a lot of sodium, especially canned goods and prepackaged meats. If you would like some assistance choosing low sodium foods, we would be happy to set you up with a nutritionist. 4. Stay as active as you can everyday. Staying active will give you more energy and make your muscles stronger. Start with 5 minutes at a time and work your way up to 30 minutes a day. Break up your activities--do some in the morning and some in the afternoon. Start with 3 days per week and work your way up to 5 days as you can.  If you have chest pain, feel short of breath, dizzy, or lightheaded, STOP. If you don't feel better after a short rest, call 911. If you do feel better, call the office to let us know you have symptoms with exercise. 5. Limit all fluids for the day to less than 2 liters. Fluid includes all drinks, coffee, juice, ice chips, soup, jello, and all other liquids.   Information on my medicine - ELIQUIS (apixaban)  Why was Eliquis prescribed for you? Eliquis was prescribed for you to reduce the risk of a blood clot forming that can cause a stroke if  you have a medical condition called atrial fibrillation (a type of irregular heartbeat).  What do You need to know about Eliquis ? Take your Eliquis TWICE DAILY - one tablet in the morning and one tablet in the evening with or without food. If you have difficulty swallowing the tablet whole please discuss with your pharmacist how to take the medication safely.  Take Eliquis exactly as prescribed by your doctor and DO NOT stop taking Eliquis without talking to the doctor who prescribed the medication.  Stopping may increase your risk of developing a stroke.  Refill your prescription before you run out.  After discharge, you should have regular check-up appointments with your healthcare provider that is prescribing your Eliquis.  In the future your dose may need to be changed if your kidney function or weight changes by a significant amount or as you get older.  What do you do if you miss a dose? If you miss a dose, take it as soon as you remember on the same day and resume taking twice daily.  Do not take more than one dose of ELIQUIS at the same time to make up a missed dose.  Important Safety Information A possible side effect of Eliquis is bleeding. You should call your healthcare provider right away if you experience any of the following: ? Bleeding from an injury or your nose that does not stop. ? Unusual colored  urine (red or dark brown) or unusual colored stools (red or black). ? Unusual bruising for unknown reasons. ? A serious fall or if you hit your head (even if there is no bleeding).  Some medicines may interact with Eliquis and might increase your risk of bleeding or clotting while on Eliquis. To help avoid this, consult your healthcare provider or pharmacist prior to using any new prescription or non-prescription medications, including herbals, vitamins, non-steroidal anti-inflammatory drugs (NSAIDs) and supplements.  This website has more information on Eliquis (apixaban):  http://www.eliquis.com/eliquis/home

## 2019-11-04 NOTE — Progress Notes (Signed)
Inpatient Diabetes Program Recommendations  AACE/ADA: New Consensus Statement on Inpatient Glycemic Control  Target Ranges:  Prepandial:   less than 140 mg/dL      Peak postprandial:   less than 180 mg/dL (1-2 hours)      Critically ill patients:  140 - 180 mg/dL  Results for KATALYN, MATIN (MRN [id number]) as of 11/04/2019 12:36  Ref. Range 11/02/2019 13:01 11/03/2019 12:56 11/03/2019 17:04 11/03/2019 21:11 11/04/2019 08:04 11/04/2019 11:19  Glucose-Capillary Latest Ref Range: 70 - 99 mg/dL 224 (H) 266 (H) 235 (H) 244 (H) 233 (H) 282 (H)   Results for KIMBREE, CASANAS (MRN [id number]) as of 11/04/2019 12:36  Ref. Range 08/16/2018 12:14 12/29/2018 14:15 05/25/2019 06:17 11/04/2019 10:49  Hemoglobin A1C Latest Ref Range: 4.8 - 5.6 % 5.6 6.2 5.3 7.3 (H)   Review of Glycemic Control  Diabetes history: DM2 Outpatient Diabetes medications: None Current orders for Inpatient glycemic control: None  Inpatient Diabetes Program Recommendations:    Insulin: While inpatient, please consider ordering CBGs AC&HS with Novolog 0-9 units TID with meals and Novolog 0-5 units QHS.  HbgA1C: A1C 7.3% on 11/04/19 indicating an average glucose of 163 mg/dl over the past 2-3 months. May want to consider discharging patient on oral DM medication if appropriate and have patient follow up with PCP regarding DM.  NOTE: Per chart, patient has hx of DM but no outpatient DM medications.    Thanks, Barnie Alderman, RN, MSN, CDE Diabetes Coordinator Inpatient Diabetes Program [phone] (Team Pager from 8am to 5pm)

## 2019-11-05 ENCOUNTER — Inpatient Hospital Stay (HOSPITAL_COMMUNITY): Payer: Medicare Other

## 2019-11-05 ENCOUNTER — Inpatient Hospital Stay: Payer: Self-pay

## 2019-11-05 LAB — BASIC METABOLIC PANEL
Anion gap: 14 (ref 5–15)
BUN: 82 mg/dL — ABNORMAL HIGH (ref 8–23)
CO2: 24 mmol/L (ref 22–32)
Calcium: 8.7 mg/dL — ABNORMAL LOW (ref 8.9–10.3)
Chloride: 93 mmol/L — ABNORMAL LOW (ref 98–111)
Creatinine, Ser: 2.77 mg/dL — ABNORMAL HIGH (ref 0.44–1.00)
GFR calc Af Amer: 17 mL/min — ABNORMAL LOW (ref >60)
GFR calc non Af Amer: 15 mL/min — ABNORMAL LOW (ref >60)
Glucose, Bld: 227 mg/dL — ABNORMAL HIGH (ref 70–99)
Potassium: 4.1 mmol/L (ref 3.5–5.1)
Sodium: 131 mmol/L — ABNORMAL LOW (ref 135–145)

## 2019-11-05 LAB — GLUCOSE, CAPILLARY
Glucose-Capillary: 226 mg/dL — ABNORMAL HIGH (ref 70–99)
Glucose-Capillary: 235 mg/dL — ABNORMAL HIGH (ref 70–99)
Glucose-Capillary: 190 mg/dL — ABNORMAL HIGH (ref 70–99)
Glucose-Capillary: 195 mg/dL — ABNORMAL HIGH (ref 70–99)

## 2019-11-05 MED ORDER — POLYETHYLENE GLYCOL 3350 17 G PO PACK
17.0000 g | PACK | Freq: Every day | ORAL | Status: DC
  Administered 2019-11-05 – 2019-11-10 (×4): 17 g via ORAL
  Filled 2019-11-05 (×5): qty 1

## 2019-11-05 MED ORDER — FUROSEMIDE 10 MG/ML IJ SOLN
10.0000 mg/h | INTRAVENOUS | Status: DC
  Administered 2019-11-05 – 2019-11-06 (×2): 10 mg/h via INTRAVENOUS
  Filled 2019-11-05: qty 25
  Filled 2019-11-05: qty 21
  Filled 2019-11-05: qty 20

## 2019-11-05 MED ORDER — FUROSEMIDE 10 MG/ML IJ SOLN
120.0000 mg | Freq: Once | INTRAVENOUS | Status: DC
  Filled 2019-11-05: qty 12

## 2019-11-05 MED ORDER — FUROSEMIDE 10 MG/ML IJ SOLN
80.0000 mg | Freq: Once | INTRAMUSCULAR | Status: AC
  Administered 2019-11-05: 80 mg via INTRAVENOUS
  Filled 2019-11-05: qty 8

## 2019-11-05 MED ORDER — SENNOSIDES-DOCUSATE SODIUM 8.6-50 MG PO TABS
1.0000 | ORAL_TABLET | Freq: Every day | ORAL | Status: DC | PRN
  Administered 2019-11-05 – 2019-11-09 (×2): 1 via ORAL
  Filled 2019-11-05 (×2): qty 1

## 2019-11-05 MED ORDER — FUROSEMIDE 10 MG/ML IJ SOLN
120.0000 mg | Freq: Once | INTRAVENOUS | Status: AC
  Administered 2019-11-05: 120 mg via INTRAVENOUS
  Filled 2019-11-05: qty 10

## 2019-11-05 NOTE — Progress Notes (Signed)
Pt.c/o chest pressure (5/10)EKG done & NTG 0.4mg  sl x 3 given with relief .MD on call made aware.Will continue to monitor pt.

## 2019-11-05 NOTE — Progress Notes (Signed)
Pt has been c/o difficulty breathing.  Crackles on BLL. Low urine out put dispite 80 mg IV lasix this morning. Cardiology made aware. Received order for bladder scan.  Pt voided 100 mL.  Post void bladder scan did not show any urine.   Idolina Primer, RN

## 2019-11-05 NOTE — Progress Notes (Signed)
Progress Note  Patient Name: Lauren Walker Date of Encounter: 11/05/2019  Primary Cardiologist: Jenne Campus, MD   Subjective   Slept poorly overnight. She complains of fluid in her lungs this morning. Remains in AF.  Inpatient Medications    Scheduled Meds: . apixaban  2.5 mg Oral BID  . atorvastatin  40 mg Oral Daily  . influenza vaccine adjuvanted  0.5 mL Intramuscular Tomorrow-1000  . insulin aspart  0-5 Units Subcutaneous QHS  . insulin aspart  0-9 Units Subcutaneous TID WC  . isosorbide dinitrate  40 mg Oral BID  . levothyroxine  150 mcg Oral QAC breakfast  . melatonin  3 mg Oral QHS   Continuous Infusions: . amiodarone 30 mg/hr (11/05/19 0533)  . diltiazem (CARDIZEM) infusion 10 mg/hr (11/05/19 0043)   PRN Meds:    Vital Signs    Vitals:   11/04/19 2132 11/05/19 0022 11/05/19 0450 11/05/19 0738  BP: (!) 129/97 (!) 141/76 (!) 143/106 (!) 133/102  Pulse: (!) 105 96 (!) 108 83  Resp: 20 18 17 16   Temp: 98.2 F (36.8 C) 98.4 F (36.9 C) 97.8 F (36.6 C) 97.9 F (36.6 C)  TempSrc: Oral Oral Oral Oral  SpO2: 94% 95% 94% 95%  Weight:   77.5 kg   Height:        Intake/Output Summary (Last 24 hours) at 11/05/2019 0922 Last data filed at 11/05/2019 0800 Gross per 24 hour  Intake 1518.07 ml  Output 230 ml  Net 1288.07 ml   Filed Weights   11/03/19 1649 11/04/19 0520 11/05/19 0450  Weight: 77 kg 76.7 kg 77.5 kg    Telemetry    AF with variable ventricular rates 90s-120s - Personally Reviewed  ECG    No new - Personally Reviewed  Physical Exam   GEN: No acute distress.  Neck: No JVD Cardiac: irregularly irregular, no murmurs, rubs, or gallops.  Respiratory: Crackles in bilateral bases. GI: Soft, nontender, non-distended  MS: No edema; No deformity. Neuro:  Nonfocal  Psych: Normal affect   Labs    Chemistry Recent Labs  Lab 11/03/19 1115 11/04/19 1049 11/05/19 0442  NA 133* 130* 131*  K 4.3 4.4 4.1  CL 96* 95* 93*  CO2 24 23 24     GLUCOSE 302* 306* 227*  BUN 84* 83* 82*  CREATININE 2.50* 2.71* 2.77*  CALCIUM 8.6* 8.6* 8.7*  PROT 6.7  --   --   ALBUMIN 3.2*  --   --   AST 16  --   --   ALT 16  --   --   ALKPHOS 58  --   --   BILITOT 0.5  --   --   GFRNONAA 17* 15* 15*  GFRAA 19* 17* 17*  ANIONGAP 13 12 14      Hematology Recent Labs  Lab 11/03/19 1115  WBC 14.2*  RBC 3.13*  HGB 9.0*  HCT 28.5*  MCV 91.1  MCH 28.8  MCHC 31.6  RDW 13.6  PLT 520*    Cardiac EnzymesNo results for input(s): TROPONINI in the last 168 hours. No results for input(s): TROPIPOC in the last 168 hours.   BNP Recent Labs  Lab 11/03/19 1115  BNP 956.3*     DDimer No results for input(s): DDIMER in the last 168 hours.   Radiology    DG Chest 2 View  Result Date: 11/03/2019 CLINICAL DATA:  Shortness of breath. EXAM: CHEST - 2 VIEW COMPARISON:  October 20, 2019. FINDINGS: Stable cardiomegaly. Right-sided  pacemaker is unchanged in position. No pneumothorax is noted. Right lung is clear. Moderate left pleural effusion is noted with probable underlying atelectasis or infiltrate. Bony thorax is unremarkable. IMPRESSION: Moderate left pleural effusion with probable underlying atelectasis or infiltrate. Aortic Atherosclerosis (ICD10-I70.0). Electronically Signed   By: Marijo Conception M.D.   On: 11/03/2019 11:40   CUP PACEART REMOTE DEVICE CHECK  Result Date: 11/03/2019 Scheduled remote reviewed. Normal device function.  Persistent AF with RVR, on OAC, there is not good ventricular rate control as heart rates are 110-140 bpm.  Sent to triage. Next remote 91 days. Kathy Breach, RN, CCDS, [employer] Scheduled remote reviewed. Normal device function.  Persistent AF with RVR, on OAC, there is not good ventricular rate control as heart rates are 110-140 bpm.  Sent to triage. Next remote 91 days. Kathy Breach, RN, CCDS, [employer]    Patient Profile     86 y.o. female with persistent AF, chronic combined systolic  and diastolic HF w/ severe MR and CAD who is admitted for a monitored amiodarone load given a prior episode of prolonged QT. She remains in AF on amio but she seems to be tolerating the medication. Plan for DCCV.  Assessment & Plan    1. Persistent AF CHADSVASc 6 on eliquis. Continue Amiodarone, cardiovert Monday. NPO Sunday night If fails DCCV, consider AVJ ablation  2. Acute on chronic combined systolic/diastolic heart failure NYHA III symptoms today. Likely multifactorial from CAD, valvular heart disease and uncontrolled AF. - AF control as above - still volume up on exam, diurese today with 80mg  given her elevated Creatinine  3. Severe MR - diurese as above - ? Mitraclip  4. CAD No ischemic symptoms this morning  For questions or updates, please contact [place] Please consult [email or web address] for contact info under Cardiology/STEMI.      Signed, Lars Mage, MD  11/05/2019, 9:22 AM

## 2019-11-05 NOTE — Progress Notes (Addendum)
    Made aware by patient's nurse she continues to experience worsening dyspnea despite receiving IV Lasix this morning. Repeat CXR obtained which shows a moderate left sided effusion and a small right effusion. Not an ideal thoracentesis candidate given the use of anticoagulation. Reviewed with Dr. Quentin Ore who recommended giving a Lasix bolus of 120mg  followed by starting a Lasix infusion at 10 mL/hr. Will order a repeat BMET and Mg for AM. Updated the patient's nurse on plan of care.   Signed, Erma Heritage, PA-C 11/05/2019, 3:45 PM

## 2019-11-06 LAB — GLUCOSE, CAPILLARY
Glucose-Capillary: 218 mg/dL — ABNORMAL HIGH (ref 70–99)
Glucose-Capillary: 241 mg/dL — ABNORMAL HIGH (ref 70–99)
Glucose-Capillary: 197 mg/dL — ABNORMAL HIGH (ref 70–99)
Glucose-Capillary: 197 mg/dL — ABNORMAL HIGH (ref 70–99)

## 2019-11-06 LAB — BASIC METABOLIC PANEL
Anion gap: 13 (ref 5–15)
BUN: 81 mg/dL — ABNORMAL HIGH (ref 8–23)
CO2: 22 mmol/L (ref 22–32)
Calcium: 8.5 mg/dL — ABNORMAL LOW (ref 8.9–10.3)
Chloride: 94 mmol/L — ABNORMAL LOW (ref 98–111)
Creatinine, Ser: 2.6 mg/dL — ABNORMAL HIGH (ref 0.44–1.00)
GFR calc Af Amer: 18 mL/min — ABNORMAL LOW (ref >60)
GFR calc non Af Amer: 16 mL/min — ABNORMAL LOW (ref >60)
Glucose, Bld: 200 mg/dL — ABNORMAL HIGH (ref 70–99)
Potassium: 3.9 mmol/L (ref 3.5–5.1)
Sodium: 129 mmol/L — ABNORMAL LOW (ref 135–145)

## 2019-11-06 LAB — MAGNESIUM: Magnesium: 2.5 mg/dL — ABNORMAL HIGH (ref 1.7–2.4)

## 2019-11-06 NOTE — Progress Notes (Signed)
Progress Note  Patient Name: Lauren Walker Date of Encounter: 11/06/2019  Primary Cardiologist: Jenne Campus, MD   Subjective   Feeling better this morning after increased UOP. Rates are controlled but she remains in AF.   Inpatient Medications    Scheduled Meds: . apixaban  2.5 mg Oral BID  . atorvastatin  40 mg Oral Daily  . influenza vaccine adjuvanted  0.5 mL Intramuscular Tomorrow-1000  . insulin aspart  0-5 Units Subcutaneous QHS  . insulin aspart  0-9 Units Subcutaneous TID WC  . isosorbide dinitrate  40 mg Oral BID  . levothyroxine  150 mcg Oral QAC breakfast  . melatonin  3 mg Oral QHS  . polyethylene glycol  17 g Oral Daily   Continuous Infusions: . amiodarone 30 mg/hr (11/06/19 0519)  . diltiazem (CARDIZEM) infusion 10 mg/hr (11/06/19 0518)  . furosemide (LASIX) infusion 10 mg/hr (11/05/19 1927)   PRN Meds:    Vital Signs    Vitals:   11/05/19 1626 11/05/19 2148 11/06/19 0019 11/06/19 0407  BP: 126/67 (!) 150/76 135/81 126/75  Pulse: (!) 45 97 98 (!) 108  Resp: 16 18 16 16   Temp: 97.8 F (36.6 C) 97.9 F (36.6 C) 98.1 F (36.7 C) 98.8 F (37.1 C)  TempSrc: Oral Oral Oral Oral  SpO2: 96% 94% 95% 95%  Weight:    77.6 kg  Height:        Intake/Output Summary (Last 24 hours) at 11/06/2019 0857 Last data filed at 11/06/2019 0600 Gross per 24 hour  Intake 692.1 ml  Output 1340 ml  Net -647.9 ml   Filed Weights   11/04/19 0520 11/05/19 0450 11/06/19 0407  Weight: 76.7 kg 77.5 kg 77.6 kg    Telemetry    AF with variable ventricular rates 90s-120s - Personally Reviewed  ECG    No new - Personally Reviewed  Physical Exam   GEN: No acute distress.   Neck: No JVD Cardiac: irregularly irregular, no rubs, or gallops.  Respiratory: poor aeration at the bases bilaterally GI: Soft, nontender, non-distended  MS: No edema; No deformity. Neuro:  Nonfocal  Psych: Normal affect   Labs    Chemistry Recent Labs  Lab 11/03/19 1115  11/03/19 1115 11/04/19 1049 11/05/19 0442 11/06/19 0115  NA 133*   < > 130* 131* 129*  K 4.3   < > 4.4 4.1 3.9  CL 96*   < > 95* 93* 94*  CO2 24   < > 23 24 22   GLUCOSE 302*   < > 306* 227* 200*  BUN 84*   < > 83* 82* 81*  CREATININE 2.50*   < > 2.71* 2.77* 2.60*  CALCIUM 8.6*   < > 8.6* 8.7* 8.5*  PROT 6.7  --   --   --   --   ALBUMIN 3.2*  --   --   --   --   AST 16  --   --   --   --   ALT 16  --   --   --   --   ALKPHOS 58  --   --   --   --   BILITOT 0.5  --   --   --   --   GFRNONAA 17*   < > 15* 15* 16*  GFRAA 19*   < > 17* 17* 18*  ANIONGAP 13   < > 12 14 13    < > = values in this interval not displayed.  Hematology Recent Labs  Lab 11/03/19 1115  WBC 14.2*  RBC 3.13*  HGB 9.0*  HCT 28.5*  MCV 91.1  MCH 28.8  MCHC 31.6  RDW 13.6  PLT 520*    Cardiac EnzymesNo results for input(s): TROPONINI in the last 168 hours. No results for input(s): TROPIPOC in the last 168 hours.   BNP Recent Labs  Lab 11/03/19 1115  BNP 956.3*     DDimer No results for input(s): DDIMER in the last 168 hours.   Radiology    9/18 CXR personally reviewed Moderate L effusion and small-mod r sided effusion.  DG CHEST PORT 1 VIEW  Result Date: 11/05/2019 CLINICAL DATA:  Dyspnea on exertion. EXAM: PORTABLE CHEST 1 VIEW COMPARISON:  11/03/2019 FINDINGS: Right-sided pacemaker unchanged. Slight interval worsening moderate left base opacification likely moderate size effusion with atelectasis. Suggestion of small right pleural effusion with atelectasis. Stable cardiomegaly. Remainder the exam is unchanged. IMPRESSION: 1. Slight interval worsening moderate left base opacification likely moderate size effusion with atelectasis. Suggestion of small right effusion with atelectasis. 2. Stable cardiomegaly. Electronically Signed   By: Marin Olp M.D.   On: 11/05/2019 15:19   [place] EKG SITE RITE  Result Date: 11/05/2019 If Site Rite image not attached, placement could not be confirmed due  to current cardiac rhythm.    Patient Profile     86 y.o. female with persistent AF, chronic combined systolic and diastolic HF w/ severe MR and CAD who is admitted for a monitored amiodarone load given a prior episode of prolonged QT. She remains in AF on amio but she seems to be tolerating the medication. Plan for DCCV.  Assessment & Plan    1. Persistent AF CHADSVASc 6 on eliquis. Continue Amiodarone, cardiovert Monday. NPO Sunday night If fails DCCV, consider AVJ ablation  2. Acute on chronic combined systolic/diastolic heart failure NYHA III symptoms today. Likely multifactorial from CAD, valvular heart disease and uncontrolled AF. - AF control as above - improving on lasix gtt (creatinine improving). Continue gtt at 10mg /hr for now.  - repeat chest x ray ordered for Monday to reassess effusions. If no improvement, may benefit from thoracentesis.  3. Severe MR - diurese as above - ? Mitraclip  4. CAD No ischemic symptoms this morning  For questions or updates, please contact [place] Please consult [email or web address] for contact info under Cardiology/STEMI.      Signed, Lars Mage, MD  11/06/2019, 8:57 AM

## 2019-11-06 NOTE — Progress Notes (Signed)
RA restricted due to PM/ICD device placed in April. Left cephalic vein accessed easily, but unable to thread guidewire beyond subclavian/innominate vein region.  Did not insert introducer due to inability to thread initial guidewire.  RN notified.  FYI placed.  Please refer central access to IR for placement.

## 2019-11-07 ENCOUNTER — Inpatient Hospital Stay (HOSPITAL_COMMUNITY): Payer: Medicare Other | Admitting: Certified Registered"

## 2019-11-07 ENCOUNTER — Encounter (HOSPITAL_COMMUNITY): Payer: Self-pay | Admitting: Cardiovascular Disease

## 2019-11-07 ENCOUNTER — Encounter (HOSPITAL_COMMUNITY)
Admission: EM | Disposition: A | Discharge status: Home / Self Care | Payer: Self-pay | Source: Home / Self Care | Attending: Cardiovascular Disease

## 2019-11-07 DIAGNOSIS — I10 Essential (primary) hypertension: Secondary | ICD-10-CM

## 2019-11-07 DIAGNOSIS — I313 Pericardial effusion (noninflammatory): Secondary | ICD-10-CM

## 2019-11-07 DIAGNOSIS — I4891 Unspecified atrial fibrillation: Secondary | ICD-10-CM

## 2019-11-07 DIAGNOSIS — I2510 Atherosclerotic heart disease of native coronary artery without angina pectoris: Secondary | ICD-10-CM

## 2019-11-07 DIAGNOSIS — I340 Nonrheumatic mitral (valve) insufficiency: Secondary | ICD-10-CM

## 2019-11-07 HISTORY — PX: CARDIOVERSION: SHX1299

## 2019-11-07 LAB — BASIC METABOLIC PANEL
Anion gap: 13 (ref 5–15)
BUN: 77 mg/dL — ABNORMAL HIGH (ref 8–23)
CO2: 24 mmol/L (ref 22–32)
Calcium: 8.4 mg/dL — ABNORMAL LOW (ref 8.9–10.3)
Chloride: 91 mmol/L — ABNORMAL LOW (ref 98–111)
Creatinine, Ser: 2.56 mg/dL — ABNORMAL HIGH (ref 0.44–1.00)
GFR calc Af Amer: 19 mL/min — ABNORMAL LOW (ref >60)
GFR calc non Af Amer: 16 mL/min — ABNORMAL LOW (ref >60)
Glucose, Bld: 274 mg/dL — ABNORMAL HIGH (ref 70–99)
Potassium: 3.3 mmol/L — ABNORMAL LOW (ref 3.5–5.1)
Sodium: 128 mmol/L — ABNORMAL LOW (ref 135–145)

## 2019-11-07 LAB — GLUCOSE, CAPILLARY
Glucose-Capillary: 214 mg/dL — ABNORMAL HIGH (ref 70–99)
Glucose-Capillary: 210 mg/dL — ABNORMAL HIGH (ref 70–99)
Glucose-Capillary: 229 mg/dL — ABNORMAL HIGH (ref 70–99)
Glucose-Capillary: 219 mg/dL — ABNORMAL HIGH (ref 70–99)

## 2019-11-07 SURGERY — CARDIOVERSION
Anesthesia: Monitor Anesthesia Care

## 2019-11-07 MED ORDER — LIDOCAINE HCL (CARDIAC) PF 100 MG/5ML IV SOSY
PREFILLED_SYRINGE | INTRAVENOUS | Status: DC | PRN
  Administered 2019-11-07: 40 mg via INTRAVENOUS

## 2019-11-07 MED ORDER — DILTIAZEM HCL ER COATED BEADS 180 MG PO CP24
180.0000 mg | ORAL_CAPSULE | Freq: Every day | ORAL | Status: DC
  Administered 2019-11-07 – 2019-11-08 (×2): 180 mg via ORAL
  Filled 2019-11-07 (×2): qty 1

## 2019-11-07 MED ORDER — PROPOFOL 10 MG/ML IV BOLUS
INTRAVENOUS | Status: DC | PRN
  Administered 2019-11-07: 30 mg via INTRAVENOUS

## 2019-11-07 MED ORDER — POTASSIUM CHLORIDE CRYS ER 20 MEQ PO TBCR: 40.0000 meq | EXTENDED_RELEASE_TABLET | Freq: Once | ORAL | Status: DC

## 2019-11-07 MED ORDER — POTASSIUM CHLORIDE CRYS ER 20 MEQ PO TBCR
40.0000 meq | EXTENDED_RELEASE_TABLET | Freq: Once | ORAL | Status: AC
  Administered 2019-11-07: 40 meq via ORAL
  Filled 2019-11-07: qty 2

## 2019-11-07 MED ORDER — SODIUM CHLORIDE 0.9 % IV SOLN
INTRAVENOUS | Status: AC | PRN
  Administered 2019-11-07: 500 mL via INTRAMUSCULAR

## 2019-11-07 NOTE — Anesthesia Preprocedure Evaluation (Signed)
Anesthesia Evaluation  Patient identified by MRN, date of birth, ID band Patient awake    Reviewed: Allergy & Precautions, NPO status , Patient's Chart, lab work & pertinent test results, reviewed documented beta blocker date and time   Airway Mallampati: II  TM Distance: >3 FB Neck ROM: Full    Dental no notable dental hx. (+) Caps   Pulmonary former smoker,    Pulmonary exam normal        Cardiovascular hypertension, Pt. on medications + CAD, + Past MI and +CHF  + dysrhythmias Atrial Fibrillation + pacemaker  Rhythm:Irregular Rate:Abnormal  Echo 10/22/19 1. Severe mitral valve regurgitation. Moderate restriction of posterior mitral leaflet, with posteriorly directed eccentric jet. Secondary mitral regurgitation, likely Carpentier type IIIB. Quantitation by PISA suggests moderate regurgitation however there is splaying of color flow Doppler and the dominant jet demonstrates Coanda effect. These finding in combination suggest severe mitral valve regurgitation. The mitral valve is grossly normal. Severe mitral valve  regurgitation.  2. Left ventricular ejection fraction, by estimation, is 45 to 50%. The left ventricle has mildly decreased function. The left ventricle demonstrates regional wall motion abnormalities (see scoring diagram/findings for description).  3. Right ventricular systolic function is moderately reduced.  4. Left atrial size was severely dilated.  5. Aortic valve regurgitation is trivial.  6. The inferior vena cava is dilated in size with <50% respiratory variability, suggesting right atrial pressure of 15 mmHg.   EKG 10/26/19 Atrial fibrillation with RVR, LAD, non specific ST-Twave changes  EP 06/20/19 1. Ischemic cardiomyopathy with chronic New York Heart Association class II heart failure and sustained VT with syncope.   2. Successful ICD implantation.   3. No early apparent complications.      Neuro/Psych negative neurological ROS  negative psych ROS   GI/Hepatic negative GI ROS, Neg liver ROS,   Endo/Other  diabetes, Poorly Controlled, Type 2Hypothyroidism   Renal/GU Renal InsufficiencyRenal disease  negative genitourinary   Musculoskeletal  (+) Arthritis , Osteoarthritis,    Abdominal Normal abdominal exam  (+)   Peds  Hematology  (+) anemia , Eliquis therapy- last dose 11/02/19    Anesthesia Other Findings   Reproductive/Obstetrics                             Anesthesia Physical  Anesthesia Plan  ASA: III  Anesthesia Plan: MAC   Post-op Pain Management:    Induction:   PONV Risk Score and Plan: 2 and Treatment may vary due to age or medical condition and Propofol infusion  Airway Management Planned: Nasal Cannula and Natural Airway  Additional Equipment: None  Intra-op Plan:   Post-operative Plan:   Informed Consent: I have reviewed the patients History and Physical, chart, labs and discussed the procedure including the risks, benefits and alternatives for the proposed anesthesia with the patient or authorized representative who has indicated his/her understanding and acceptance.     Dental advisory given  Plan Discussed with: CRNA  Anesthesia Plan Comments:         Anesthesia Quick Evaluation

## 2019-11-07 NOTE — Transfer of Care (Signed)
Immediate Anesthesia Transfer of Care Note  Patient: Lauren Walker  Procedure(s) Performed: CARDIOVERSION (N/A )  Patient Location: PACU  Anesthesia Type:MAC  Level of Consciousness: drowsy and patient cooperative  Airway & Oxygen Therapy: Patient Spontanous Breathing and Patient connected to nasal cannula oxygen  Post-op Assessment: Report given to RN and Post -op Vital signs reviewed and stable  Post vital signs: Reviewed and stable  Last Vitals:  Vitals Value Taken Time  BP    Temp    Pulse    Resp    SpO2      Last Pain:  Vitals:   11/07/19 1200  TempSrc: Temporal  PainSc: 0-No pain      Patients Stated Pain Goal: 0 (17/79/39 0300)  Complications: No complications documented.

## 2019-11-07 NOTE — Progress Notes (Addendum)
Progress Note  Patient Name: Lauren Walker Date of Encounter: 11/07/2019  [facility] Cardiologist: Jenne Campus, MD   Subjective   Plan for cardioversion today. Patient has chest pressure related to pericardial effusion. Unable to lay flat. Still has some lower leg edema. Patient put out 636mL overnight.   Inpatient Medications    Scheduled Meds: . apixaban  2.5 mg Oral BID  . atorvastatin  40 mg Oral Daily  . influenza vaccine adjuvanted  0.5 mL Intramuscular Tomorrow-1000  . insulin aspart  0-5 Units Subcutaneous QHS  . insulin aspart  0-9 Units Subcutaneous TID WC  . isosorbide dinitrate  40 mg Oral BID  . levothyroxine  150 mcg Oral QAC breakfast  . melatonin  3 mg Oral QHS  . polyethylene glycol  17 g Oral Daily   Continuous Infusions: . amiodarone 30 mg/hr (11/07/19 0513)  . diltiazem (CARDIZEM) infusion 10 mg/hr (11/06/19 2314)  . furosemide (LASIX) infusion 10 mg/hr (11/06/19 2320)   PRN Meds: senna-docusate   Vital Signs    Vitals:   11/06/19 0019 11/06/19 0407 11/06/19 1652 11/07/19 0507  BP: 135/81 126/75 126/67 (!) 148/77  Pulse: 98 (!) 108 (!) 102 68  Resp: 16 16 18 18   Temp: 98.1 F (36.7 C) 98.8 F (37.1 C) 98.4 F (36.9 C) 97.8 F (36.6 C)  TempSrc: Oral Oral Oral Oral  SpO2: 95% 95% 97% 96%  Weight:  77.6 kg  77.3 kg  Height:        Intake/Output Summary (Last 24 hours) at 11/07/2019 9604 Last data filed at 11/07/2019 0514 Gross per 24 hour  Intake 480 ml  Output 650 ml  Net -170 ml   Last 3 Weights 11/07/2019 11/06/2019 11/05/2019  Weight (lbs) 170 lb 8 oz 171 lb 1.6 oz 170 lb 14.4 oz  Weight (kg) 77.338 kg 77.61 kg 77.52 kg      Telemetry    Afib, HR around 100 bpm, PVCs, occasional V-paced beat.  - Personally Reviewed  ECG    No new - Personally Reviewed  Physical Exam   GEN: No acute distress.   Neck: No JVD Cardiac: Irreg Irreg, + murmur, no rubs, or gallops.  Respiratory: Diminished at bases GI: Soft,  nontender, non-distended  MS: 1+ B/L edema; No deformity. Neuro:  Nonfocal  Psych: Normal affect   Labs    High Sensitivity Troponin:   Recent Labs  Lab 10/20/19 1544 10/20/19 1734 11/03/19 1115 11/03/19 1400  TROPONINIHS 18* 18* 19* 20*      Chemistry Recent Labs  Lab 11/03/19 1115 11/04/19 1049 11/05/19 0442 11/06/19 0115 11/07/19 0258  NA 133*   < > 131* 129* 128*  K 4.3   < > 4.1 3.9 3.3*  CL 96*   < > 93* 94* 91*  CO2 24   < > 24 22 24   GLUCOSE 302*   < > 227* 200* 274*  BUN 84*   < > 82* 81* 77*  CREATININE 2.50*   < > 2.77* 2.60* 2.56*  CALCIUM 8.6*   < > 8.7* 8.5* 8.4*  PROT 6.7  --   --   --   --   ALBUMIN 3.2*  --   --   --   --   AST 16  --   --   --   --   ALT 16  --   --   --   --   ALKPHOS 58  --   --   --   --  BILITOT 0.5  --   --   --   --   GFRNONAA 17*   < > 15* 16* 16*  GFRAA 19*   < > 17* 18* 19*  ANIONGAP 13   < > 14 13 13    < > = values in this interval not displayed.     Hematology Recent Labs  Lab 11/03/19 1115  WBC 14.2*  RBC 3.13*  HGB 9.0*  HCT 28.5*  MCV 91.1  MCH 28.8  MCHC 31.6  RDW 13.6  PLT 520*    BNP Recent Labs  Lab 11/03/19 1115  BNP 956.3*     DDimer No results for input(s): DDIMER in the last 168 hours.   Radiology    DG CHEST PORT 1 VIEW  Result Date: 11/05/2019 CLINICAL DATA:  Dyspnea on exertion. EXAM: PORTABLE CHEST 1 VIEW COMPARISON:  11/03/2019 FINDINGS: Right-sided pacemaker unchanged. Slight interval worsening moderate left base opacification likely moderate size effusion with atelectasis. Suggestion of small right pleural effusion with atelectasis. Stable cardiomegaly. Remainder the exam is unchanged. IMPRESSION: 1. Slight interval worsening moderate left base opacification likely moderate size effusion with atelectasis. Suggestion of small right effusion with atelectasis. 2. Stable cardiomegaly. Electronically Signed   By: Marin Olp M.D.   On: 11/05/2019 15:19   [place] EKG SITE RITE  Result  Date: 11/05/2019 If Site Rite image not attached, placement could not be confirmed due to current cardiac rhythm.   Cardiac Studies   Echo 10/22/19 1. Severe mitral valve regurgitation. Moderate restriction of posterior  mitral leaflet, with posteriorly directed eccentric jet. Secondary mitral  regurgitation, likely Carpentier type IIIB. Quantitation by PISA suggests  moderate regurgitation however  there is splaying of color flow Doppler and the dominant jet demonstrates  Coanda effect. These finding in combination suggest severe mitral valve  regurgitation. The mitral valve is grossly normal. Severe mitral valve  regurgitation.  2. Left ventricular ejection fraction, by estimation, is 45 to 50%. The  left ventricle has mildly decreased function. The left ventricle  demonstrates regional wall motion abnormalities (see scoring  diagram/findings for description).  3. Right ventricular systolic function is moderately reduced.  4. Left atrial size was severely dilated.  5. Aortic valve regurgitation is trivial.  6. The inferior vena cava is dilated in size with <50% respiratory  variability, suggesting right atrial pressure of 15 mmHg.   Myoview 06/2019 IMPRESSION: 1. Large scar along the inferolateral wall and smaller scar along the anteroseptal wall. No inducible ischemia is identified.  .  Dilated left ventricle.  2. Mild poor wall thickening and hypokinesis along the lateral wall.  3. Left ventricular ejection fraction 46%  4. Non invasive risk stratification*: High  *2012 Appropriate Use Criteria for Coronary Revascularization Focused Update: J Am Coll Cardiol. 6948;54(6):270-350. http://content.airportbarriers.com.aspx?articleid=1201161   Electronically Signed   By: Van Clines M.D.   On: 06/19/2019 13:09  Patient Profile     86 y.o. female with pmh of CAD s/p Inferior MI in 2016 and chronic occlusion of the RCA with mild to mod nonobstructive  disease in the LAD and left Cx per LHC in 2016, MR, chronic combined systolic and diastolic CHF, persistent afib, VT felt secondary to amiodarone s/p ICD implantations, stage IV CKD, HTN, HLD, hypothyroidism, DM2, anemia who is being seen for afib and CHF.   Assessment & Plan    Persistent Afib - H/o of multiple failed cardioversions in the past - On Eliquis with CHADSVASC of 6 - EP following -  On amiodarone and dilt - Plan for DCCV today. IF fails plan for AVJ ablation  Acute on chronic combined systolic and diastolic CHF s/p ICD with pleural effusions - In the setting of Afib and Severe MR\ - TEE with EF 50% - Presented with left sided pleural effusion and BNP 900 with elevated BUN and creatinine at 2.5 (higher than baseline) and started on IV lasix - IV lasix drip 250mg  at 35mL/hr - Overnight patient put out -639mL, Net -8.9mL since admission - Weight do not appear accurate - creatinine 2.60>2.56. Still not at baseline around 2-2.2 - Still has edema on exam. Continue with diuresis. Might need higher lasix dose - CXR today for pleural effusions  Severe MR - PTA discussed with Dr. Agustin Cree for Mitraclip and plan was for structural heart team referral - TEE 11/02/19 showed LVEF 50% with severe MR - Structural heart team saw and plan for Trans-catheter MVR and further work-up as OP - Cardiothoracic consulted who agreed with Mitraclip  CAD  - S/p Inf MI in 2016 with LHC showing chronically occluded RCA with mild to mod nonobstructive disease in the LAD and left Cx  - No chest pain - Continue Aspirin and statin  Pericardial effusion - moderate sized pericardial effusion on echo 11/02/19 - plan for repeat echo as OP per [name]  HLD - atorvastatin  HTN - Isordil 40 mg BID - pressures reasonable  For questions or updates, please contact [place] Please consult [email or web address] for contact info under        Signed, Cadence Ninfa Meeker, PA-C  11/07/2019, 6:32 AM     History and all data above reviewed.  Patient examined.  I agree with the findings as above.   Now in NSR post cardioversion.  Breathing has improved since admission but not back to baseline.  The patient exam reveals COR:RRR  ,  Lungs: Decreased breath sounds  ,  Abd: Positive bowel sounds, no rebound no guarding, Ext No edema  .  All available labs, radiology testing, previous records reviewed. Agree with documented assessment and plan. Acute on chronic systolic and diastolic HF.  Net negative 8.5 liters.   Continue IV Lasix drip.  Repeat CXR ordered for the AM.  Need to consider thoracentesis if fluid on the left is unchanged. I reviewed the images from two days ago.   Jeneen Rinks Naara Kelty  11:48 AM  11/07/2019

## 2019-11-07 NOTE — Progress Notes (Signed)
Spoke with Jonni Sanger PA for diltiazem reduction to 5 mg. May d/c if patient remains in stable.

## 2019-11-07 NOTE — Progress Notes (Addendum)
Electrophysiology Rounding Note  Patient Name: Lauren Walker Date of Encounter: 11/07/2019  Primary Cardiologist: Jenne Campus, MD Electrophysiologist: Dr. Lovena Le   Subjective   Feeling OK this am. No new complaints.   Inpatient Medications    Scheduled Meds: . apixaban  2.5 mg Oral BID  . atorvastatin  40 mg Oral Daily  . influenza vaccine adjuvanted  0.5 mL Intramuscular Tomorrow-1000  . insulin aspart  0-5 Units Subcutaneous QHS  . insulin aspart  0-9 Units Subcutaneous TID WC  . isosorbide dinitrate  40 mg Oral BID  . levothyroxine  150 mcg Oral QAC breakfast  . melatonin  3 mg Oral QHS  . polyethylene glycol  17 g Oral Daily   Continuous Infusions: . amiodarone 30 mg/hr (11/07/19 0513)  . diltiazem (CARDIZEM) infusion 10 mg/hr (11/06/19 2314)  . furosemide (LASIX) infusion 10 mg/hr (11/06/19 2320)   PRN Meds: senna-docusate   Vital Signs    Vitals:   11/06/19 0019 11/06/19 0407 11/06/19 1652 11/07/19 0507  BP: 135/81 126/75 126/67 (!) 148/77  Pulse: 98 (!) 108 (!) 102 68  Resp: 16 16 18 18   Temp: 98.1 F (36.7 C) 98.8 F (37.1 C) 98.4 F (36.9 C) 97.8 F (36.6 C)  TempSrc: Oral Oral Oral Oral  SpO2: 95% 95% 97% 96%  Weight:  77.6 kg  77.3 kg  Height:        Intake/Output Summary (Last 24 hours) at 11/07/2019 0718 Last data filed at 11/07/2019 0514 Gross per 24 hour  Intake 480 ml  Output 650 ml  Net -170 ml   Filed Weights   11/05/19 0450 11/06/19 0407 11/07/19 0507  Weight: 77.5 kg 77.6 kg 77.3 kg    Physical Exam    GEN- The patient is elderly appearing, alert and oriented x 3 today.   Head- normocephalic, atraumatic Eyes-  Sclera clear, conjunctiva pink Ears- hearing intact Oropharynx- clear Neck- supple Lungs- Decreased basilar sounds to ausculation bilaterally, L>R, normal work of breathing Heart- Irregularly irregular, No rubs or gallops GI- soft, NT, ND, + BS Extremities- no clubbing or cyanosis. Mild peripheral edema.    Skin- no rash or lesion Psych- flat but appropriate  Neuro- strength and sensation are intact  Labs    CBC No results for input(s): WBC, NEUTROABS, HGB, HCT, MCV, PLT in the last 72 hours. Basic Metabolic Panel Recent Labs    11/06/19 0115 11/07/19 0258  NA 129* 128*  K 3.9 3.3*  CL 94* 91*  CO2 22 24  GLUCOSE 200* 274*  BUN 81* 77*  CREATININE 2.60* 2.56*  CALCIUM 8.5* 8.4*  MG 2.5*  --    Liver Function Tests No results for input(s): AST, ALT, ALKPHOS, BILITOT, PROT, ALBUMIN in the last 72 hours. No results for input(s): LIPASE, AMYLASE in the last 72 hours. Cardiac Enzymes No results for input(s): CKTOTAL, CKMB, CKMBINDEX, TROPONINI in the last 72 hours.   Telemetry    Atrial fibrillation with rates 80-110s (personally reviewed)  Radiology    DG CHEST PORT 1 VIEW  Result Date: 11/05/2019 CLINICAL DATA:  Dyspnea on exertion. EXAM: PORTABLE CHEST 1 VIEW COMPARISON:  11/03/2019 FINDINGS: Right-sided pacemaker unchanged. Slight interval worsening moderate left base opacification likely moderate size effusion with atelectasis. Suggestion of small right pleural effusion with atelectasis. Stable cardiomegaly. Remainder the exam is unchanged. IMPRESSION: 1. Slight interval worsening moderate left base opacification likely moderate size effusion with atelectasis. Suggestion of small right effusion with atelectasis. 2. Stable cardiomegaly. Electronically Signed  By: Marin Olp M.D.   On: 11/05/2019 15:19   [place] EKG SITE RITE  Result Date: 11/05/2019 If Site Rite image not attached, placement could not be confirmed due to current cardiac rhythm.   Patient Profile     86 y.o. female with persistent AF, chronic combined systolic and diastolic HF w/ severe MR and CAD who is admitted for a monitored amiodarone load given a prior episode of prolonged QT. She remains in AF on amio but she seems to be tolerating the medication. Plan for DCCV.  Assessment & Plan    1.  Persistent AF CHADSVASc 6 on eliquis. Continue Amiodarone. [facility] planned for this afternoon.  Remains NPO.  If fails DCCV, consider AVJ ablation  2. Acute on chronic combined systolic/diastolic heart failure NYHA III symptoms today.  Likely multifactorial from CAD, valvular heart disease and uncontrolled AF. Volume status remains slightly elevated but improving. Continue lasix gtt for now. Consider stopping once NSR.  AF control as above  3. Pleural effusions Repeat CXR. Consider thoracentesis if not improved  4. Hypokalemia K 3.3 this am 40 meq of K ordered   5. Severe MR Diuresis as above.  ? Mitraclip   6.  AKI on CKD IV Baseline Cr appears 1.8 - 2.2, though has been more steadily in 2.0-2.4 range over past several months.  Cr 2.77 -> 2.60 -> 2.56 with diuresis. Negative 8.5 mL. Unclear if accurate. Will re-iterate standing weights.  7. CAD Denies ischemic symptoms.    For questions or updates, please contact [place] Please consult [email or web address] for contact info under Cardiology/STEMI.  Signed, [name], PA-C  11/07/2019, 7:18 AM   EP Attending  Patient seen and examined. Agree with above. The patient remains in atrial fib though with better rate control. She will undergo DCCV later today. Hopefully she will maintain NSR. Her renal function has improved but still stage 4. Note possible mitra clip in the next several weeks. If she remains in NSR, she will need 3 weeks of systemic anti-coagulation before stopping to perform mitra clip.   [name],MD

## 2019-11-07 NOTE — Anesthesia Postprocedure Evaluation (Signed)
Anesthesia Post Note  Patient: Lauren Walker  Procedure(s) Performed: CARDIOVERSION (N/A )     Patient location during evaluation: Endoscopy Anesthesia Type: MAC Level of consciousness: awake Pain management: pain level controlled Vital Signs Assessment: post-procedure vital signs reviewed and stable Respiratory status: spontaneous breathing Cardiovascular status: stable Postop Assessment: no apparent nausea or vomiting Anesthetic complications: no   No complications documented.  Last Vitals:  Vitals:   11/07/19 1235 11/07/19 1240  BP: (!) 134/54 (!) 138/54  Pulse: 72 72  Resp: 20 18  Temp: 36.4 C   SpO2: 100% 100%    Last Pain:  Vitals:   11/07/19 1240  TempSrc:   PainSc: 0-No pain                 Huston Foley

## 2019-11-07 NOTE — Progress Notes (Signed)
Remote ICD transmission.   

## 2019-11-07 NOTE — CV Procedure (Signed)
    Cardioversion Note  Lauren Walker [id number] 16-Aug-1929  Procedure: DC Cardioversion Indications: atrial fibrillation  Procedure Details Consent: Obtained Time Out: Verified patient identification, verified procedure, site/side was marked, verified correct patient position, special equipment/implants available, Radiology Safety Procedures followed,  medications/allergies/relevent history reviewed, required imaging and test results available.  Performed  The patient has been on adequate anticoagulation.  The patient received IV Propofol 30 mg and iv Lidocaine 40 mg administered by anesthesia staff for deep sedation.  Synchronous cardioversion was performed at 200 joules.  The cardioversion was successful.  The patient was cardioverted with 120 J and shortly after cardioversion developed atrial fibrillation again. We were able to cardiovert her into SR again with 200 J. [employer] rep confirmed SR, he adjusted pacing to 70 BPM.  Complications: No apparent complications Patient did tolerate procedure well.  Ena Dawley, MD, [facility] 11/07/2019, 12:37 PM

## 2019-11-08 ENCOUNTER — Other Ambulatory Visit: Payer: Self-pay | Admitting: Family Medicine

## 2019-11-08 ENCOUNTER — Other Ambulatory Visit: Payer: Self-pay

## 2019-11-08 ENCOUNTER — Inpatient Hospital Stay (HOSPITAL_COMMUNITY): Payer: Medicare Other

## 2019-11-08 DIAGNOSIS — I3131 Malignant pericardial effusion in diseases classified elsewhere: Secondary | ICD-10-CM

## 2019-11-08 DIAGNOSIS — I340 Nonrheumatic mitral (valve) insufficiency: Secondary | ICD-10-CM

## 2019-11-08 HISTORY — PX: IR THORACENTESIS ASP PLEURAL SPACE W/IMG GUIDE: IMG5380

## 2019-11-08 LAB — BASIC METABOLIC PANEL
Anion gap: 13 (ref 5–15)
BUN: 73 mg/dL — ABNORMAL HIGH (ref 8–23)
CO2: 25 mmol/L (ref 22–32)
Calcium: 8.5 mg/dL — ABNORMAL LOW (ref 8.9–10.3)
Chloride: 95 mmol/L — ABNORMAL LOW (ref 98–111)
Creatinine, Ser: 2.58 mg/dL — ABNORMAL HIGH (ref 0.44–1.00)
GFR calc Af Amer: 19 mL/min — ABNORMAL LOW (ref >60)
GFR calc non Af Amer: 16 mL/min — ABNORMAL LOW (ref >60)
Glucose, Bld: 139 mg/dL — ABNORMAL HIGH (ref 70–99)
Potassium: 3.5 mmol/L (ref 3.5–5.1)
Sodium: 133 mmol/L — ABNORMAL LOW (ref 135–145)

## 2019-11-08 LAB — GLUCOSE, CAPILLARY
Glucose-Capillary: 189 mg/dL — ABNORMAL HIGH (ref 70–99)
Glucose-Capillary: 266 mg/dL — ABNORMAL HIGH (ref 70–99)
Glucose-Capillary: 210 mg/dL — ABNORMAL HIGH (ref 70–99)
Glucose-Capillary: 190 mg/dL — ABNORMAL HIGH (ref 70–99)

## 2019-11-08 MED ORDER — TORSEMIDE 20 MG PO TABS
40.0000 mg | ORAL_TABLET | Freq: Two times a day (BID) | ORAL | Status: DC
  Administered 2019-11-08 – 2019-11-10 (×5): 40 mg via ORAL
  Filled 2019-11-08 (×5): qty 2

## 2019-11-08 MED ORDER — AMIODARONE HCL 200 MG PO TABS
200.0000 mg | ORAL_TABLET | Freq: Two times a day (BID) | ORAL | Status: DC
  Administered 2019-11-08 – 2019-11-10 (×5): 200 mg via ORAL
  Filled 2019-11-08 (×5): qty 1

## 2019-11-08 MED ORDER — DILTIAZEM HCL ER COATED BEADS 240 MG PO CP24
240.0000 mg | ORAL_CAPSULE | Freq: Every day | ORAL | Status: DC
  Administered 2019-11-09 – 2019-11-10 (×2): 240 mg via ORAL
  Filled 2019-11-08 (×2): qty 1

## 2019-11-08 MED ORDER — LIDOCAINE HCL 1 % IJ SOLN
INTRAMUSCULAR | Status: AC
  Filled 2019-11-08: qty 20

## 2019-11-08 MED ORDER — POTASSIUM CHLORIDE CRYS ER 20 MEQ PO TBCR
40.0000 meq | EXTENDED_RELEASE_TABLET | Freq: Once | ORAL | Status: AC
  Administered 2019-11-08: 40 meq via ORAL
  Filled 2019-11-08: qty 2

## 2019-11-08 MED ORDER — DILTIAZEM HCL ER COATED BEADS 120 MG PO CP24
120.0000 mg | ORAL_CAPSULE | Freq: Once | ORAL | Status: AC
  Administered 2019-11-08: 120 mg via ORAL
  Filled 2019-11-08: qty 1

## 2019-11-08 MED ORDER — LIDOCAINE HCL 1 % IJ SOLN
INTRAMUSCULAR | Status: DC | PRN
  Administered 2019-11-08: 10 mL

## 2019-11-08 NOTE — Progress Notes (Addendum)
Electrophysiology Rounding Note  Patient Name: Lauren Walker Date of Encounter: 11/08/2019  Primary Cardiologist: Jenne Campus, MD Electrophysiologist: Dr. Lovena Le   Subjective   Remains in NSR this am  She remains SOB more than her baseline.   Inpatient Medications    Scheduled Meds:  apixaban  2.5 mg Oral BID   atorvastatin  40 mg Oral Daily   diltiazem  180 mg Oral Daily   influenza vaccine adjuvanted  0.5 mL Intramuscular Tomorrow-1000   insulin aspart  0-5 Units Subcutaneous QHS   insulin aspart  0-9 Units Subcutaneous TID WC   isosorbide dinitrate  40 mg Oral BID   levothyroxine  150 mcg Oral QAC breakfast   melatonin  3 mg Oral QHS   polyethylene glycol  17 g Oral Daily   Continuous Infusions:  amiodarone 30 mg/hr (11/08/19 0555)   PRN Meds: senna-docusate   Vital Signs    Vitals:   11/07/19 1300 11/07/19 1325 11/07/19 2036 11/08/19 0425  BP: (!) 142/59 (!) 138/50 (!) 134/51 (!) 147/66  Pulse: 73 75 72 74  Resp: (!) 21 20 20    Temp:  97.8 F (36.6 C) 97.6 F (36.4 C) (!) 97.5 F (36.4 C)  TempSrc:  Oral Oral Oral  SpO2: 95%  96% 94%  Weight:    77.6 kg  Height:        Intake/Output Summary (Last 24 hours) at 11/08/2019 0722 Last data filed at 11/08/2019 0428 Gross per 24 hour  Intake 100 ml  Output 950 ml  Net -850 ml   Filed Weights   11/07/19 0507 11/07/19 1200 11/08/19 0425  Weight: 77.3 kg 77.3 kg 77.6 kg    Physical Exam    GEN- The patient is well appearing, alert and oriented x 3 today.   Head- normocephalic, atraumatic Eyes-  Sclera clear, conjunctiva pink Ears- hearing intact Oropharynx- clear Neck- supple Lungs- Diminished basilar sounds, L>R. Normal work of breathing Heart- Regular rate and rhythm, no murmurs, rubs or gallops GI- soft, NT, ND, + BS Extremities- no clubbing or cyanosis. No edema Skin- no rash or lesion Psych- euthymic mood, full affect Neuro- strength and sensation are intact  Labs    CBC No  results for input(s): WBC, NEUTROABS, HGB, HCT, MCV, PLT in the last 72 hours. Basic Metabolic Panel Recent Labs    11/06/19 0115 11/06/19 0115 11/07/19 0258 11/08/19 0228  NA 129*   < > 128* 133*  K 3.9   < > 3.3* 3.5  CL 94*   < > 91* 95*  CO2 22   < > 24 25  GLUCOSE 200*   < > 274* 139*  BUN 81*   < > 77* 73*  CREATININE 2.60*   < > 2.56* 2.58*  CALCIUM 8.5*   < > 8.4* 8.5*  MG 2.5*  --   --   --    < > = values in this interval not displayed.   Liver Function Tests No results for input(s): AST, ALT, ALKPHOS, BILITOT, PROT, ALBUMIN in the last 72 hours. No results for input(s): LIPASE, AMYLASE in the last 72 hours. Cardiac Enzymes No results for input(s): CKTOTAL, CKMB, CKMBINDEX, TROPONINI in the last 72 hours.   Telemetry    AP 70s (personally reviewed)  Radiology    No results found.  Patient Profile     86 y.o. female with persistent AF, chronic combined systolic and diastolic HF w/ severe MR and CAD who is admitted for a monitored  amiodarone load given a prior episode of prolonged QT.   Assessment & Plan    1. Persistent AF CHADSVASc 6 on eliquis. Now in NSR s/p DCCV.  Transition amiodarone to po 200 mg BID. Follow QT closely.  If fails DCCV, consider AVJ ablation   2. Acute on chronic combined systolic/diastolic heart failure NYHA III symptoms Likely multifactorial from CAD, valvular heart disease and uncontrolled AF. Volume status at least mildly elevated. Resume po diuretics now in NSR.  Plan for thoracentesis pending CXR results. (Note that she cannot hold eliquis due to recent [facility])    3. Pleural effusions Repeat CXR pending. Consider thoracentesis if not improved per primary   4. Hypokalemia K 3.5 this am.  Will supp gently given CKD.    5. Severe MR Diuresis as above.  ? Mitraclip    6.  AKI on CKD IV Baseline Cr appears 1.8 - 2.2, though has been more steadily in 2.0-2.4 range over past several months.  Cr 2.77 -> 2.60 -> 2.56 ->  2.58 Negative 850 mL this admit. Weight shows up 2 lbs from admission.    7. CAD No ischemic symptoms.   Will transition to po amiodarone. EP will see as needed while here. Will make outpatient f/u.   For questions or updates, please contact [place] Please consult [email or web address] for contact info under Cardiology/STEMI.  Signed, [name], PA-C  11/08/2019, 7:22 AM   EP Attending  Patient seen and examined. Agree with above. He will switch to amiodarone orally 200 bid for 3 weeks then 200 mg daily.  Carleene Overlie Tinna Kolker,MD

## 2019-11-08 NOTE — Procedures (Signed)
PROCEDURE SUMMARY:  Successful US guided left thoracentesis. Yielded 800 ml of light red fluid. Pt tolerated procedure well. No immediate complications.  CXR ordered; no post-procedure pneumothorax identified  EBL < 5 mL  Theresa Duty, NP 11/08/2019 11:59 AM

## 2019-11-08 NOTE — Evaluation (Signed)
Physical Therapy Evaluation Patient Details Name: Lauren Walker MRN: [id number] DOB: November 28, 1929 Today's Date: 11/08/2019   History of Present Illness  Patient is a 86 y/o female who presents with SOB, abdominal pain and edema. Found to be in A-fib with RVR and admitted with acute on chronic diastolic and systolic heart failure. CXR-left pleural effusion. s/p cardioversion 9/20, s/p thoracentesis 9/21. PMH includes DM, CAD, CKD, breast ca, MI, HTN, HLD, aortic stenosis.  Clinical Impression  Patient presents with dyspnea on exertion, decreased activity tolerance, impaired balance and impaired mobility s/p above. Pt reports being Mod I with use of rollator vs SPC for community and for ADLs. Has difficulty standing for low periods to cook and does not drive. Today, pt tolerated transfers, gait training and pre TAVR testing with Min guard assist for balance/safety. Noted to take multiple standing/seated rest breaks due to 2-3/4 DOE. HR irregular up to 138 bpm max and Sp02 stayed >94% throughout. See below for TAVR results. Will follow acutely to maximize independence and mobility and reassess needs post procedure.   11/08/2019 PT TAVR Pre-Assessment  6 Minute Walk Test:   Total Distance Walked:168.1 ft.    Did the pt need a rest break? Yes If yes, why? Pain:No; Fatigue:Yes; Dyspnea/O2 saturations: Yes Comments: Patient stopped the test after ~4 minutes for a seated rest break and declined further walking due to SOB.   Pre-Test Post-Test  BP 134/75 151/82  HR 90 bpm 132 bpm  O2 saturations (indicated RA or L/min [place]) 97% on RA 94% on RA  Modified Borg Dyspnea Scale (0 none-10 maximal) 1 5  RPE (6 very light-10 very hard) 9 15  Comments:   5 Meter Walk Test:  Trial 1 8.4 seconds  Trial 2 9.1 seconds  Trial 3 8.8 seconds  3 Trial Average/Gait Speed 8.8 seconds/1.37ft/sec (<1.8 ft/sec indicates high fall risk)  Comments: Required seated rest break between second and third trials due to SOB.    Clinical Frailty Scale (1 very fit - 9 terminally ill): 4 (</= 5/12 is considered frail)     Follow Up Recommendations No PT follow up;Supervision - Intermittent (pending post procedure)    Equipment Recommendations  None recommended by PT    Recommendations for Other Services       Precautions / Restrictions Precautions Precautions: Fall Precaution Comments: watch HR Restrictions Weight Bearing Restrictions: No      Mobility  Bed Mobility Overal bed mobility: Needs Assistance Bed Mobility: Supine to Sit     Supine to sit: Modified independent (Device/Increase time);HOB elevated     General bed mobility comments: Use of rail to get to EOB.  Transfers Overall transfer level: Needs assistance Equipment used: 4-wheeled walker Transfers: Sit to/from Stand Sit to Stand: Min guard         General transfer comment: Min guard for safety. Stood from [employer], from rollator chair x4, cues needed to [employer] for safety.  Ambulation/Gait Ambulation/Gait assistance: Supervision Gait Distance (Feet): 230 Feet Assistive device: 4-wheeled walker Gait Pattern/deviations: Step-through pattern;Decreased stride length   Gait velocity interpretation: 1.31 - 2.62 ft/sec, indicative of limited community ambulator General Gait Details: Slow, mostly steady gait with flexed trunk using rollator; 2-3/4 DOE with walking tests, a few standing/seated rest breaks needed. SP02 remained in 90s on RA. HR irregular, up to 138 max bpm  Stairs            Wheelchair Mobility    Modified Rankin (Stroke Patients Only)  Balance Overall balance assessment: Mild deficits observed, not formally tested                                           Pertinent Vitals/Pain Pain Assessment: No/denies pain    Home Living Family/patient expects to be discharged to:: Private residence Living Arrangements: Spouse/significant other Available Help at  Discharge: Family Type of Home: House Home Access: Level entry     Home Layout: One level Home Equipment: Cane - single point;Walker - 2 wheels;Shower seat      Prior Function Level of Independence: Independent with assistive device(s)         Comments: USes RW vs SPC as needed for ambulation in the community; does own ADLs/IADLs, difficulty standing up for long periods to cook.     Hand Dominance   Dominant Hand: Right    Extremity/Trunk Assessment   Upper Extremity Assessment Upper Extremity Assessment: Defer to OT evaluation    Lower Extremity Assessment Lower Extremity Assessment: Generalized weakness (but functional, grossly ~4/5 throughout)    Cervical / Trunk Assessment Cervical / Trunk Assessment: Kyphotic  Communication   Communication: No difficulties  Cognition Arousal/Alertness: Awake/alert Behavior During Therapy: WFL for tasks assessed/performed Overall Cognitive Status: Within Functional Limits for tasks assessed                                        General Comments General comments (skin integrity, edema, etc.): Spouse present during session.    Exercises     Assessment/Plan    PT Assessment Patient needs continued PT services  PT Problem List Decreased mobility;Decreased activity tolerance;Cardiopulmonary status limiting activity;Decreased balance       PT Treatment Interventions Therapeutic activities;Gait training;Therapeutic exercise;Patient/family education;Balance training;Functional mobility training    PT Goals (Current goals can be found in the Care Plan section)  Acute Rehab PT Goals Patient Stated Goal: to rest PT Goal Formulation: With patient Time For Goal Achievement: 11/22/19 Potential to Achieve Goals: Good    Frequency Min 3X/week   Barriers to discharge        Co-evaluation               AM-PAC PT "6 Clicks" Mobility  Outcome Measure Help needed turning from your back to your side while  in a flat bed without using bedrails?: None Help needed moving from lying on your back to sitting on the side of a flat bed without using bedrails?: A Little Help needed moving to and from a bed to a chair (including a wheelchair)?: A Little Help needed standing up from a chair using your arms (e.g., wheelchair or bedside chair)?: A Little Help needed to walk in hospital room?: None Help needed climbing 3-5 steps with a railing? : A Little 6 Click Score: 20    End of Session Equipment Utilized During Treatment: Gait belt Activity Tolerance: Patient limited by fatigue Patient left: in bed;with call bell/phone within reach;with family/visitor present (sitting EOB) Nurse Communication: Mobility status;Other (comment) (HR) PT Visit Diagnosis: Difficulty in walking, not elsewhere classified (R26.2);Other (comment) (DOE)    Time: 9924-2683 PT Time Calculation (min) (ACUTE ONLY): 36 min   Charges:   PT Evaluation $PT Eval Moderate Complexity: 1 Mod PT Treatments $Gait Training: 8-22 mins        Doshia Dalia  Karren Cobble, DPT Acute Rehabilitation Services Pager [phone] Office [phone]      Lacie Draft 11/08/2019, 4:08 PM

## 2019-11-08 NOTE — Progress Notes (Addendum)
Progress Note  Patient Name: Lauren Walker Date of Encounter: 11/08/2019  [facility] Cardiologist: Jenne Campus, MD   Subjective   Patient underwent successful cardioversion yesterday. Overall feeling better than yesterday but still sob. CXR today showed moderate left pleural effusion. Lasix drip stopped last night and started on home torsemide.   Inpatient Medications    Scheduled Meds: . amiodarone  200 mg Oral BID  . apixaban  2.5 mg Oral BID  . atorvastatin  40 mg Oral Daily  . diltiazem  180 mg Oral Daily  . influenza vaccine adjuvanted  0.5 mL Intramuscular Tomorrow-1000  . insulin aspart  0-5 Units Subcutaneous QHS  . insulin aspart  0-9 Units Subcutaneous TID WC  . isosorbide dinitrate  40 mg Oral BID  . levothyroxine  150 mcg Oral QAC breakfast  . melatonin  3 mg Oral QHS  . polyethylene glycol  17 g Oral Daily   Continuous Infusions:  PRN Meds: senna-docusate   Vital Signs    Vitals:   11/07/19 1300 11/07/19 1325 11/07/19 2036 11/08/19 0425  BP: (!) 142/59 (!) 138/50 (!) 134/51 (!) 147/66  Pulse: 73 75 72 74  Resp: (!) 21 20 20    Temp:  97.8 F (36.6 C) 97.6 F (36.4 C) (!) 97.5 F (36.4 C)  TempSrc:  Oral Oral Oral  SpO2: 95%  96% 94%  Weight:    77.6 kg  Height:        Intake/Output Summary (Last 24 hours) at 11/08/2019 0738 Last data filed at 11/08/2019 0428 Gross per 24 hour  Intake 100 ml  Output 950 ml  Net -850 ml   Last 3 Weights 11/08/2019 11/07/2019 11/07/2019  Weight (lbs) 171 lb 170 lb 6.7 oz 170 lb 8 oz  Weight (kg) 77.565 kg 77.3 kg 77.338 kg      Telemetry    A-paced rhythm, HR 70s, occasionaly PVCs  - Personally Reviewed  ECG     pending- Personally Reviewed  Physical Exam   GEN: No acute distress.   Neck: No JVD Cardiac: RRR, no murmurs, rubs, or gallops.  Respiratory: Diminished breath sounds GI: Soft, nontender, non-distended  MS: No edema; No deformity. Neuro:  Nonfocal  Psych: Normal affect   Labs      High Sensitivity Troponin:   Recent Labs  Lab 10/20/19 1544 10/20/19 1734 11/03/19 1115 11/03/19 1400  TROPONINIHS 18* 18* 19* 20*      Chemistry Recent Labs  Lab 11/03/19 1115 11/04/19 1049 11/06/19 0115 11/07/19 0258 11/08/19 0228  NA 133*   < > 129* 128* 133*  K 4.3   < > 3.9 3.3* 3.5  CL 96*   < > 94* 91* 95*  CO2 24   < > 22 24 25   GLUCOSE 302*   < > 200* 274* 139*  BUN 84*   < > 81* 77* 73*  CREATININE 2.50*   < > 2.60* 2.56* 2.58*  CALCIUM 8.6*   < > 8.5* 8.4* 8.5*  PROT 6.7  --   --   --   --   ALBUMIN 3.2*  --   --   --   --   AST 16  --   --   --   --   ALT 16  --   --   --   --   ALKPHOS 58  --   --   --   --   BILITOT 0.5  --   --   --   --  GFRNONAA 17*   < > 16* 16* 16*  GFRAA 19*   < > 18* 19* 19*  ANIONGAP 13   < > 13 13 13    < > = values in this interval not displayed.     Hematology Recent Labs  Lab 11/03/19 1115  WBC 14.2*  RBC 3.13*  HGB 9.0*  HCT 28.5*  MCV 91.1  MCH 28.8  MCHC 31.6  RDW 13.6  PLT 520*    BNP Recent Labs  Lab 11/03/19 1115  BNP 956.3*     DDimer No results for input(s): DDIMER in the last 168 hours.   Radiology    No results found.  Cardiac Studies   Echo 10/22/19 1. Severe mitral valve regurgitation. Moderate restriction of posterior  mitral leaflet, with posteriorly directed eccentric jet. Secondary mitral  regurgitation, likely Carpentier type IIIB. Quantitation by PISA suggests  moderate regurgitation however  there is splaying of color flow Doppler and the dominant jet demonstrates  Coanda effect. These finding in combination suggest severe mitral valve  regurgitation. The mitral valve is grossly normal. Severe mitral valve  regurgitation.  2. Left ventricular ejection fraction, by estimation, is 45 to 50%. The  left ventricle has mildly decreased function. The left ventricle  demonstrates regional wall motion abnormalities (see scoring  diagram/findings for description).  3. Right  ventricular systolic function is moderately reduced.  4. Left atrial size was severely dilated.  5. Aortic valve regurgitation is trivial.  6. The inferior vena cava is dilated in size with <50% respiratory  variability, suggesting right atrial pressure of 15 mmHg.   Myoview 06/2019 IMPRESSION: 1. Large scar along the inferolateral wall and smaller scar along the anteroseptal wall. No inducible ischemia is identified.  . Dilated left ventricle.  2. Mild poor wall thickening and hypokinesis along the lateral wall.  3. Left ventricular ejection fraction 46%  4. Non invasive risk stratification*: High  *2012 Appropriate Use Criteria for Coronary Revascularization Focused Update: J Am Coll Cardiol. 0277;41(2):878-676. http://content.airportbarriers.com.aspx?articleid=1201161   Electronically Signed By: Van Clines M.D. On: 06/19/2019 13:09  Patient Profile     86 y.o. female with pmh of CAD s/p Inferior MI in 2016 and chronic occlusion of the RCA with mild to mod nonobstructive disease in the LAD and left Cx per LHC in 2016, MR, chronic combined systolic and diastolic CHF, persistent afib, VT felt secondary to amiodarone s/p ICD implantations, stage IV CKD, HTN, HLD, hypothyroidism, DM2, anemia who is being seen for afib and CHF.   Assessment & Plan    Persistent Afib - H/o of multiple failed cardioversions in the past - On Eliquis with CHADSVASC of 6 - EP following - On amiodarone and dilt>>converted to PO - Underwent successful DCCV yesterday. Remains in sinus today  Acute on chronic combined systolic and diastolic CHF s/p ICD with pleural effusions - In the setting of Afib and Severe MR\ - TEE with EF 50% - Presented with left sided pleural effusion and BNP 900 with elevated BUN and creatinine at 2.5 (higher than baseline) and started on IV lasix - IV lasix drip 250mg  at 40mL/hr>>stopped last night and started on home Torsemide 40 mg daily -  Patient is new -853mL since admission. Weights show overall 3lb increase - creatinine stable today 2.56>2.58. Still not at baseline around 2-2.2 - No significant LLE. CXR showed moderate left pleural effusion. Patient is still short of breath, Suspect will need IR for possible thoracentesis.  Severe MR - PTA discussed with Dr.  Agustin Cree for Mitraclip and plan was for structural heart team referral - TEE 11/02/19 showed LVEF 50% with severe MR - Structural heart team saw and plan for Trans-catheter MVR and further work-up as OP - Cardiothoracic consulted who agreed with Mitraclip  CAD  - S/p Inf MI in 2016 with LHC showing chronically occluded RCA with mild to mod nonobstructive disease in the LAD and left Cx  - No chest pain - Continue Aspirin and statin  Pericardial effusion - moderate sized pericardial effusion on echo 11/02/19 - plan for repeat echo as OP per [name]  HLD - atorvastatin  HTN - Isordil 40 mg BID and diltiazem 180mg  daily - pressures reasonable  For questions or updates, please contact [place] Please consult [email or web address] for contact info under        Signed, Cadence Ninfa Meeker, PA-C  11/08/2019, 7:38 AM    History and all data above reviewed.  Patient examined.  I agree with the findings as above.  The patient exam reveals RCB:[id number]   ,  Lungs: Decreased breath sounds at the bases  ,  Abd: Positive bowel sounds, no rebound no guarding, Ext No edema  .  All available labs, radiology testing, previous records reviewed. Agree with documented assessment and plan.   Atrial fib:  Back in fib.  On PO amiodarone starting this morning.  I will give an extra dose of 120 CD Cardizem today and start 240 in the morning.  She might have more BP room for rate control.  Respiratory failure:  We will take her down to see if there is enough pleural fluid for thoracentesis.  She has sats in the low 90s and this decreases with ambulation.    Jeneen Rinks [facility]  10:57 AM   11/08/2019

## 2019-11-08 NOTE — Care Management Important Message (Signed)
Important Message  Patient Details  Name: Lauren Walker MRN: [id number] Date of Birth: 1929/05/17   Medicare Important Message Given:  Yes     [name] 11/08/2019, 12:17 PM

## 2019-11-09 ENCOUNTER — Inpatient Hospital Stay (HOSPITAL_COMMUNITY): Payer: Medicare Other

## 2019-11-09 ENCOUNTER — Encounter (HOSPITAL_COMMUNITY): Payer: Self-pay | Admitting: Cardiology

## 2019-11-09 LAB — BASIC METABOLIC PANEL
Anion gap: 11 (ref 5–15)
BUN: 70 mg/dL — ABNORMAL HIGH (ref 8–23)
CO2: 25 mmol/L (ref 22–32)
Calcium: 8.4 mg/dL — ABNORMAL LOW (ref 8.9–10.3)
Chloride: 95 mmol/L — ABNORMAL LOW (ref 98–111)
Creatinine, Ser: 2.64 mg/dL — ABNORMAL HIGH (ref 0.44–1.00)
GFR calc Af Amer: 18 mL/min — ABNORMAL LOW (ref >60)
GFR calc non Af Amer: 16 mL/min — ABNORMAL LOW (ref >60)
Glucose, Bld: 245 mg/dL — ABNORMAL HIGH (ref 70–99)
Potassium: 3.8 mmol/L (ref 3.5–5.1)
Sodium: 131 mmol/L — ABNORMAL LOW (ref 135–145)

## 2019-11-09 LAB — CBC
HCT: 26.7 % — ABNORMAL LOW (ref 36.0–46.0)
Hemoglobin: 8.5 g/dL — ABNORMAL LOW (ref 12.0–15.0)
MCH: 28.5 pg (ref 26.0–34.0)
MCHC: 31.8 g/dL (ref 30.0–36.0)
MCV: 89.6 fL (ref 80.0–100.0)
Platelets: 448 10*3/uL — ABNORMAL HIGH (ref 150–400)
RBC: 2.98 MIL/uL — ABNORMAL LOW (ref 3.87–5.11)
RDW: 14 % (ref 11.5–15.5)
WBC: 12.3 10*3/uL — ABNORMAL HIGH (ref 4.0–10.5)
nRBC: 0 % (ref 0.0–0.2)

## 2019-11-09 LAB — GLUCOSE, CAPILLARY
Glucose-Capillary: 175 mg/dL — ABNORMAL HIGH (ref 70–99)
Glucose-Capillary: 201 mg/dL — ABNORMAL HIGH (ref 70–99)
Glucose-Capillary: 184 mg/dL — ABNORMAL HIGH (ref 70–99)
Glucose-Capillary: 221 mg/dL — ABNORMAL HIGH (ref 70–99)

## 2019-11-09 LAB — TROPONIN I (HIGH SENSITIVITY)
Troponin I (High Sensitivity): 15 ng/L (ref <18)
Troponin I (High Sensitivity): 16 ng/L (ref <18)

## 2019-11-09 MED ORDER — NITROGLYCERIN 0.4 MG SL SUBL
SUBLINGUAL_TABLET | SUBLINGUAL | Status: AC
  Filled 2019-11-09: qty 1

## 2019-11-09 MED ORDER — LORAZEPAM 0.5 MG PO TABS
0.5000 mg | ORAL_TABLET | Freq: Once | ORAL | Status: AC
  Administered 2019-11-09: 0.5 mg via ORAL
  Filled 2019-11-09: qty 1

## 2019-11-09 MED ORDER — NITROGLYCERIN 0.4 MG SL SUBL
0.4000 mg | SUBLINGUAL_TABLET | SUBLINGUAL | Status: DC | PRN
  Administered 2019-11-09 (×3): 0.4 mg via SUBLINGUAL

## 2019-11-09 MED ORDER — FLUTICASONE PROPIONATE 50 MCG/ACT NA SUSP
1.0000 | Freq: Every day | NASAL | Status: DC
  Administered 2019-11-09 – 2019-11-10 (×2): 1 via NASAL
  Filled 2019-11-09: qty 16

## 2019-11-09 NOTE — Progress Notes (Addendum)
Progress Note  Patient Name: Lauren Walker Date of Encounter: 11/09/2019  [facility] Cardiologist: Jenne Campus, MD   Subjective   Patient went back in afib overnight.Thoracentesis yielded 827ml light red fluid, no immediate complications. Breathing is better. No chest pain. Dilt increased yesterday.   Inpatient Medications    Scheduled Meds: . amiodarone  200 mg Oral BID  . apixaban  2.5 mg Oral BID  . atorvastatin  40 mg Oral Daily  . diltiazem  240 mg Oral Daily  . influenza vaccine adjuvanted  0.5 mL Intramuscular Tomorrow-1000  . insulin aspart  0-5 Units Subcutaneous QHS  . insulin aspart  0-9 Units Subcutaneous TID WC  . isosorbide dinitrate  40 mg Oral BID  . levothyroxine  150 mcg Oral QAC breakfast  . melatonin  3 mg Oral QHS  . polyethylene glycol  17 g Oral Daily  . torsemide  40 mg Oral BID   Continuous Infusions:  PRN Meds: lidocaine, senna-docusate   Vital Signs    Vitals:   11/08/19 1604 11/08/19 2001 11/09/19 0300 11/09/19 0545  BP: (!) 151/72 (!) 114/96 113/61   Pulse:  98 61   Resp:  17 20   Temp:  99 F (37.2 C) 98.4 F (36.9 C)   TempSrc:  Oral Oral   SpO2:  96% 94%   Weight:    76.7 kg  Height:        Intake/Output Summary (Last 24 hours) at 11/09/2019 0647 Last data filed at 11/09/2019 0530 Gross per 24 hour  Intake 240 ml  Output 300 ml  Net -60 ml   Last 3 Weights 11/09/2019 11/08/2019 11/07/2019  Weight (lbs) 169 lb 3.2 oz 171 lb 170 lb 6.7 oz  Weight (kg) 76.749 kg 77.565 kg 77.3 kg      Telemetry    Afib, HR 90--110, PVCs - Personally Reviewed  ECG    No new - Personally Reviewed  Physical Exam   GEN: No acute distress.   Neck: No JVD Cardiac: Irreg Irreg, no murmurs, rubs, or gallops.  Respiratory: Clear to auscultation bilaterally. GI: Soft, nontender, non-distended  MS: No edema; No deformity. Neuro:  Nonfocal  Psych: Normal affect   Labs    High Sensitivity Troponin:   Recent Labs  Lab  10/20/19 1544 10/20/19 1734 11/03/19 1115 11/03/19 1400  TROPONINIHS 18* 18* 19* 20*      Chemistry Recent Labs  Lab 11/03/19 1115 11/04/19 1049 11/06/19 0115 11/07/19 0258 11/08/19 0228  NA 133*   < > 129* 128* 133*  K 4.3   < > 3.9 3.3* 3.5  CL 96*   < > 94* 91* 95*  CO2 24   < > 22 24 25   GLUCOSE 302*   < > 200* 274* 139*  BUN 84*   < > 81* 77* 73*  CREATININE 2.50*   < > 2.60* 2.56* 2.58*  CALCIUM 8.6*   < > 8.5* 8.4* 8.5*  PROT 6.7  --   --   --   --   ALBUMIN 3.2*  --   --   --   --   AST 16  --   --   --   --   ALT 16  --   --   --   --   ALKPHOS 58  --   --   --   --   BILITOT 0.5  --   --   --   --   [facility]  17*   < > 16* 16* 16*  GFRAA 19*   < > 18* 19* 19*  ANIONGAP 13   < > 13 13 13    < > = values in this interval not displayed.     Hematology Recent Labs  Lab 11/03/19 1115  WBC 14.2*  RBC 3.13*  HGB 9.0*  HCT 28.5*  MCV 91.1  MCH 28.8  MCHC 31.6  RDW 13.6  PLT 520*    BNP Recent Labs  Lab 11/03/19 1115  BNP 956.3*     DDimer No results for input(s): DDIMER in the last 168 hours.   Radiology    DG Chest 1 View  Result Date: 11/08/2019 CLINICAL DATA:  Left-sided thoracentesis EXAM: CHEST  1 VIEW COMPARISON:  Earlier today FINDINGS: No residual left pleural fluid is seen. No re-expansion edema or visible pneumothorax. Stable hazy density at the right base partially obscured by pacer generator pack. Cardiopericardial enlargement with ICD/pacer leads from the right. IMPRESSION: No evidence of complication or residual fluid after left thoracentesis. Electronically Signed   By: Monte Fantasia M.D.   On: 11/08/2019 11:49   DG CHEST PORT 1 VIEW  Result Date: 11/08/2019 CLINICAL DATA:  Shortness of breath with pleural effusion EXAM: PORTABLE CHEST 1 VIEW COMPARISON:  November 05, 2019 FINDINGS: Moderate left pleural effusion noted with atelectasis and consolidation in the left lower lobe region. Note that a portion of the right base is  obscured by pacemaker device. There is mild medial right base atelectasis. Right lung otherwise clear. Heart is enlarged with pulmonary vascularity normal, stable. Pacemaker leads attached to right atrium and right ventricle. No adenopathy. No bone lesions. IMPRESSION: Moderate left pleural effusion with atelectasis and probable consolidation left base. Mild atelectasis medial right base. Note that a portion of the right base is obscured by pacemaker device. Stable cardiomegaly. Pacemaker leads attached to right atrium right ventricle. Electronically Signed   By: Lowella Grip III M.D.   On: 11/08/2019 08:07   IR THORACENTESIS ASP PLEURAL SPACE W/IMG GUIDE  Result Date: 11/08/2019 INDICATION: Patient with a history of chronic heart failure and recurrent pleural effusions presents today for a therapeutic thoracentesis. EXAM: ULTRASOUND GUIDED THORACENTESIS MEDICATIONS: 1% lidocaine 10 mL COMPLICATIONS: None immediate. PROCEDURE: An ultrasound guided thoracentesis was thoroughly discussed with the patient and questions answered. The benefits, risks, alternatives and complications were also discussed. The patient understands and wishes to proceed with the procedure. Written consent was obtained. Ultrasound was performed to localize and mark an adequate pocket of fluid in the left chest. The area was then prepped and draped in the normal sterile fashion. 1% Lidocaine was used for local anesthesia. Under ultrasound guidance a 6 Fr Safe-T-Centesis catheter was introduced. Thoracentesis was performed. The catheter was removed and a dressing applied. FINDINGS: A total of approximately 800 mL of light red fluid was removed. IMPRESSION: Successful ultrasound guided left thoracentesis yielding 800 mL of pleural fluid. Read by: Soyla Dryer, NP Electronically Signed   By: Ruthann Cancer MD   On: 11/08/2019 11:59    Cardiac Studies   Echo 10/22/19 1. Severe mitral valve regurgitation. Moderate restriction of  posterior  mitral leaflet, with posteriorly directed eccentric jet. Secondary mitral  regurgitation, likely Carpentier type IIIB. Quantitation by PISA suggests  moderate regurgitation however  there is splaying of color flow Doppler and the dominant jet demonstrates  Coanda effect. These finding in combination suggest severe mitral valve  regurgitation. The mitral valve is grossly normal. Severe mitral valve  regurgitation.  2. Left ventricular ejection fraction, by estimation, is 45 to 50%. The  left ventricle has mildly decreased function. The left ventricle  demonstrates regional wall motion abnormalities (see scoring  diagram/findings for description).  3. Right ventricular systolic function is moderately reduced.  4. Left atrial size was severely dilated.  5. Aortic valve regurgitation is trivial.  6. The inferior vena cava is dilated in size with <50% respiratory  variability, suggesting right atrial pressure of 15 mmHg.   Myoview 06/2019 IMPRESSION: 1. Large scar along the inferolateral wall and smaller scar along the anteroseptal wall. No inducible ischemia is identified.  . Dilated left ventricle.  2. Mild poor wall thickening and hypokinesis along the lateral wall.  3. Left ventricular ejection fraction 46%  4. Non invasive risk stratification*: High  *2012 Appropriate Use Criteria for Coronary Revascularization Focused Update: J Am Coll Cardiol. 3500;93(8):182-993. http://content.airportbarriers.com.aspx?articleid=1201161   Electronically Signed By: Van Clines M.D. On: 06/19/2019 13:09  Patient Profile     86 y.o. female pmh of CAD s/p Inferior MI in 2016 and chronic occlusion of the RCA with mild to mod nonobstructive disease in the LAD and left Cx per LHC in 2016, MR, chronic combined systolic and diastolic CHF, persistent afib, VT felt secondary to amiodarone s/p ICD implantations, stage IV CKD, HTN, HLD, hypothyroidism, DM2, anemia  who is being seen for afib and CHF.  Assessment & Plan    Persistent Afib - H/o of multiple failed cardioversions in the past - On Eliquis with CHADSVASC of 6 - EP following - On amiodarone and dilt>>converted to PO - Underwent successful DCCV however went back into afib overnight.  - Dilt increased for better rate control - Plan to follow-up for further management as OP  Acute on chronic combined systolic and diastolic CHF s/p ICDwith pleural effusions - In the setting of Afib and Severe MR - TEE with EF 50% - Presented with left sided pleural effusion and BNP 900 with elevated BUN and creatinine at 2.5 (higher than baseline) and started on IV lasix - Patient was transition from IV lasix drip250mg  at 30mL/hr to home Torsemide 40 mg  - Patient is new -975mLsince admission. Weights down 3lbs from yesterday - creatinine stable 2.56>2.58. AM labs pending - CXR showed moderate left pleural effusion and patient underwent thoracentesis yielding 865ml light red fluid. Breathing is much better.   Severe MR - PTA discussed with Dr. Agustin Cree for Mitraclip and plan was for structural heart team referral - TEE 11/02/19 showed LVEF 50% with severe MR - Structural heart team saw and plan for Trans-catheter MVR and further work-up as OP - Cardiothoracic consulted who agreed with Mitraclip  CAD  - S/p Inf MI in 2016 with LHC showing chronically occluded RCA with mild to mod nonobstructive disease in the LAD and left Cx  - No chest pain - Continue Aspirin and statin  Pericardial effusion - moderate sized pericardial effusion on echo 11/02/19 - plan for repeat echo as OP per [name]  HLD - atorvastatin  HTN - Isordil 40 mg BID and diltiazem 180mg  daily - Dilt increased as above - BP this AM 113/61  For questions or updates, please contact [place] Please consult [email or web address] for contact info under        Signed, Cadence Ninfa Meeker, PA-C  11/09/2019, 6:47 AM     History and all data above reviewed.  Patient examined.  I agree with the findings as above.  She feels better but not quite  at baseline.  The patient exam reveals SBB:[id number]   ,  Lungs: Decreased breath sounds right greater than left  ,  Abd: Positive bowel sounds, no rebound no guarding, Ext Mild edema  .  All available labs, radiology testing, previous records reviewed. Agree with documented assessment and plan.   Atrial fib:  Today we will increase the Cardizem and see if we have reasonable rate control with the goal of getting her home possibly tomorrow.  I have asked the nurses to walk her to look at her rate control.  We also need to judge whether she needs home O2 at least for the short term so they can measure her O2 sats with ambulation no RA.    Jeneen Rinks [facility]  10:23 AM  11/09/2019

## 2019-11-09 NOTE — Progress Notes (Signed)
   11/09/19 1322  Assess: MEWS Score  BP 118/65  ECG Heart Rate 94  Resp (!) 30  SpO2 95 %  O2 Device Room Air  Assess: MEWS Score  MEWS Temp 0  MEWS Systolic 0  MEWS Pulse 0  MEWS RR 2  MEWS LOC 0  MEWS Score 2  MEWS Score Color Yellow  Assess: if the MEWS score is Yellow or Red  Were vital signs taken at a resting state? Yes  Focused Assessment Change from prior assessment (see assessment flowsheet)  Early Detection of Sepsis Score *See Row Information* Medium  MEWS guidelines implemented *See Row Information* Yes  Treat  MEWS Interventions Administered prn meds/treatments  Pain Scale 0-10  Pain Score 6  Pain Type Acute pain  Pain Location Chest  Pain Orientation Left  Pain Descriptors / Indicators Pressure  Pain Frequency Constant  Pain Onset Sudden  Patients Stated Pain Goal 0  Pain Intervention(s) Medication (See eMAR)  Take Vital Signs  Increase Vital Sign Frequency  Yellow: Q 2hr X 2 then Q 4hr X 2, if remains yellow, continue Q 4hrs  Escalate  MEWS: Escalate Yellow: discuss with charge nurse/RN and consider discussing with provider and RRT  Notify: Charge Nurse/RN  Name of Charge Nurse/RN Notified Kristen RN  Date Charge Nurse/RN Notified 11/09/19  Time Charge Nurse/RN Notified 1355  Notify: Provider  Provider Name/Title Kathlen Mody PA  Date Provider Notified 11/09/19  Time Provider Notified 1336  Notification Type Page  Notification Reason Other (Comment) (CP)  Response See new orders  Date of Provider Response 11/09/19  Time of Provider Response 1340  Document  Patient Outcome Other (Comment) (stable but close monitoring )  Progress note created (see row info) Yes

## 2019-11-09 NOTE — Progress Notes (Signed)
Called by nurse patient was having 6/10 chest pain and SOB. In afib with HR 100-120 and blood pressures a little soft. Given SL nitro x 2 with improvement. New crackles on lungs. EKG obtained with no significant changes. When to assess patient. She said that SOB and CP started when she tried to lay flat. She is now 1/10 pain and breathing better. Will get CXR for new changes and check troponin to be complete although suspect might be elevated given afib with intermittently elevated rates. Will continue to monitor.   Karlissa Aron Kathlen Mody, PA-C

## 2019-11-09 NOTE — Progress Notes (Signed)
Patient called to notify that she is having chest pressure, rating 6/10, left side, and c/o feeling like it is difficult to breath.  HR 95-105 afib on monitor, initial BP118/65, RR 30, crackles heard left base.  Patient placed on 2L [place], EKG done and SL NTG given.  Pain decreased from 6 to 3 out of 10 after 3 NTG.  PA Furth notified and CXR ordered.  Will come to see patient.

## 2019-11-09 NOTE — Progress Notes (Signed)
Patient ambulated in hall with walker on room air.  HR 98-100 prior to walking increased to 130s with ambulation.  Oxygen saturation maintained 95% during ambulation.  Patient stopped to rest with c/o SOB and feeling tired.  Gait steady with walker.

## 2019-11-10 ENCOUNTER — Inpatient Hospital Stay: Payer: Medicare Other

## 2019-11-10 ENCOUNTER — Inpatient Hospital Stay: Payer: Medicare Other | Admitting: Oncology

## 2019-11-10 DIAGNOSIS — I248 Other forms of acute ischemic heart disease: Secondary | ICD-10-CM

## 2019-11-10 DIAGNOSIS — E785 Hyperlipidemia, unspecified: Secondary | ICD-10-CM

## 2019-11-10 DIAGNOSIS — D649 Anemia, unspecified: Secondary | ICD-10-CM

## 2019-11-10 DIAGNOSIS — I3139 Other pericardial effusion (noninflammatory): Secondary | ICD-10-CM

## 2019-11-10 DIAGNOSIS — F411 Generalized anxiety disorder: Secondary | ICD-10-CM

## 2019-11-10 DIAGNOSIS — I313 Pericardial effusion (noninflammatory): Secondary | ICD-10-CM

## 2019-11-10 HISTORY — DX: Other forms of acute ischemic heart disease: I24.8

## 2019-11-10 HISTORY — DX: Generalized anxiety disorder: F41.1

## 2019-11-10 HISTORY — DX: Other pericardial effusion (noninflammatory): I31.39

## 2019-11-10 HISTORY — DX: Anemia, unspecified: D64.9

## 2019-11-10 HISTORY — DX: Hyperlipidemia, unspecified: E78.5

## 2019-11-10 HISTORY — DX: Pericardial effusion (noninflammatory): I31.3

## 2019-11-10 LAB — CBC
HCT: 27 % — ABNORMAL LOW (ref 36.0–46.0)
Hemoglobin: 8.1 g/dL — ABNORMAL LOW (ref 12.0–15.0)
MCH: 27.1 pg (ref 26.0–34.0)
MCHC: 30 g/dL (ref 30.0–36.0)
MCV: 90.3 fL (ref 80.0–100.0)
Platelets: 454 10*3/uL — ABNORMAL HIGH (ref 150–400)
RBC: 2.99 MIL/uL — ABNORMAL LOW (ref 3.87–5.11)
RDW: 14.2 % (ref 11.5–15.5)
WBC: 12.8 10*3/uL — ABNORMAL HIGH (ref 4.0–10.5)
nRBC: 0 % (ref 0.0–0.2)

## 2019-11-10 LAB — BASIC METABOLIC PANEL
Anion gap: 15 (ref 5–15)
BUN: 71 mg/dL — ABNORMAL HIGH (ref 8–23)
CO2: 23 mmol/L (ref 22–32)
Calcium: 8.5 mg/dL — ABNORMAL LOW (ref 8.9–10.3)
Chloride: 96 mmol/L — ABNORMAL LOW (ref 98–111)
Creatinine, Ser: 2.72 mg/dL — ABNORMAL HIGH (ref 0.44–1.00)
GFR calc Af Amer: 17 mL/min — ABNORMAL LOW (ref >60)
GFR calc non Af Amer: 15 mL/min — ABNORMAL LOW (ref >60)
Glucose, Bld: 237 mg/dL — ABNORMAL HIGH (ref 70–99)
Potassium: 3.8 mmol/L (ref 3.5–5.1)
Sodium: 134 mmol/L — ABNORMAL LOW (ref 135–145)

## 2019-11-10 LAB — GLUCOSE, CAPILLARY
Glucose-Capillary: 189 mg/dL — ABNORMAL HIGH (ref 70–99)
Glucose-Capillary: 313 mg/dL — ABNORMAL HIGH (ref 70–99)

## 2019-11-10 MED ORDER — DILTIAZEM HCL ER COATED BEADS 300 MG PO CP24: 300.0000 mg | ORAL_CAPSULE | Freq: Every day | ORAL | 2 refills | Status: DC

## 2019-11-10 MED ORDER — TORSEMIDE 20 MG PO TABS: 40.0000 mg | ORAL_TABLET | Freq: Two times a day (BID) | ORAL | 2 refills | Status: DC

## 2019-11-10 MED ORDER — LORAZEPAM 0.5 MG PO TABS: 0.5000 mg | ORAL_TABLET | Freq: Two times a day (BID) | ORAL | 0 refills | Status: DC | PRN

## 2019-11-10 MED ORDER — AMIODARONE HCL 200 MG PO TABS: 200.0000 mg | ORAL_TABLET | Freq: Two times a day (BID) | ORAL | 2 refills | Status: AC

## 2019-11-10 MED ORDER — DILTIAZEM HCL ER COATED BEADS 180 MG PO CP24: 300.0000 mg | ORAL_CAPSULE | Freq: Every day | ORAL | Status: DC

## 2019-11-10 MED FILL — CARTIA XT 300 MG CAPSULE SA: 300 | 30 days supply | Qty: 30 | Fill #0

## 2019-11-10 MED FILL — LORazepam 0.5 MG TABS: 0.5 | 15 days supply | Qty: 30 | Fill #0

## 2019-11-10 MED FILL — AMIODARONE HCL 200 MG TAB: 200 | 30 days supply | Qty: 60 | Fill #0

## 2019-11-10 NOTE — Discharge Summary (Signed)
Discharge Summary    Patient ID: Lauren Walker MRN: [id number]; DOB: 01-22-1930  Admit date: 11/03/2019 Discharge date: 11/10/2019  Primary Care Provider: Midge Minium, MD  Primary Cardiologist: Jenne Campus, MD  Primary Electrophysiologist:  None   Discharge Diagnoses    Principal Problem:   Persistent atrial fibrillation [facility]) Active Problems:   Pleural effusion on left   Hypothyroid   CAD (coronary artery disease)   History of VT felt to be secondary to Amiodarone and Prolonged QT s/p ICD   Demand ischemia ([place])   Pericardial effusion   Hyperlipidemia   Normocytic anemia   Anxiety    Diagnostic Studies/Procedures    None this admission.   History of Present Illness     Lauren Walker is a 86 y.o. female with a history of CAD s/p inferior MI in 2016 with cath showing chronically occluded RCA with mild to moderate non-obstructive disease in LAD and LCX, chronic combined (mostly diastolic) CHF, VT felt to be secondary to Amiodarone and prolonged QT s/p ICD, persistent atrial fibrillation on Eliquis, severe mitral regurgitation, hypertension, dyslipidemia, hypothyroidism, CKD stage IV who is followed by Dr. Agustin Cree.    Patient was last seen by Dr. Agustin Cree on 10/21/2019 with complaints of chest tightness and noted to be in atrial fibrillation with RVR; therefore, patient was transferred to the [facility] ED and then was ultimately admitted to [facility]. During her stay, her ICD was interrogated which showed atrial fibrillation with RVR. She was cardioverted x3 in the ED but this was unsuccessful. She was then started on IV Cardizem and continued to go in and out of atrial fibrillation. Her Eliquis was continued without interruption. It was felt that she could not be on amiodarone due to prior VT felt to be related to Amiodarone and QT prolongation. Echo showed LVEF of 45-50% with akinesis of basal inferior segment and hypokinesis of mid and distal inferior  wall. Also noted to have severe MR. She was seen by [name] and plan was for TEE and work-up for MitraClip as outpatient.   She underwent outpatient TEE on 11/02/2019 which showed LVEF of 5% with mildly reduced RV function, biatrial enlargement, moderate pericardial effusion, prolapse of A2 scallop with severe eccentric MR, mild to moderate TR, and moderate grade 3 atheroma plaque involving the transverse and descending aorta. No left atrial/left atrial appendage thrombus.  Presented to the [facility] ED again on 11/03/2019 with shortness of breath and palpitations and was again found to be in atrial fibrillation with RVR (rates in the 130's). She was started on IV Cardizem with moderate control. Chest x-ray showed left-sided pleural effusion. BNP was elevated in the 900's. BUN and creatinine were elevated at 84 and 2.5, respectively which is slightly higher than baseline. Patient was admitted for atrial fibrillation with RVR and acute CHF.  Hospital Course     Consultants: EP, Structural Heart Team ([name] and Dr. Roxy Manns), and Interventional Radiology  Persistent Atrial Fibrillation History of VT Felt to be Secondary to Amiodarone and Prolonged QT s/p ICD Patient started on IV Cardizem with moderate control. EP was consulted for recurrent of atrial fibrillation after multiple failed DCCVs. They recommended retrial of Amiodarone with repeat DCCV. Therefore, patient was loaded with IV Amiodarone and underwent successful DCCV on 11/07/2019. Cardizem and Amiodarone both switched to PO. Unfortunately, patient went back into atrial fibrillation overnight on 11/08/2019. Plan is for rate control. EP does not recommend repeat DCCV because that would only  delay MitraClip procedure. Cardizem uptitrated. Rates reasonable well controlled in the 90's at rest on day of discharge. Will increase Cardizem to 300mg  daily on discharge and Continue PO Amiodarone 200mg  twice daily. QTc stable at 500 ms on EKG on  11/09/2019. BP occasionally soft at times so Lopressor was held given increase in Cardizem. May be able to restart as outpatient. Continue Eliquis 2.5mg  twice daily (reduced dose due to age and renal function). Patient has close follow-up arranged for next week - can recheck EKG at that time to re-evaluate QT.  Acute ofn Chronic Diastolic CHF Left Sided Pleural Effusion BNP elevated at 956.3. Chest x-ray showed left sided pleural effusion. LVEF 45-50% on TTE on 10/22/2019 and 50% on TEE on 11/02/2019. Patient was initially diuresed with IV Lasix drip but and then switched to home Torsemide on 11/08/2019. Net negative 1,026 mL this admission. IR was also consulted and thoracentesis was performed on 11/08/2019 with removal of 800 mL of light red fluid. Repeat chest x-ray on 11/09/2019 showed mild left basilar atelectasis and/or infiltrate bu no pleural effusion or pneumothorax. Appears euvolemic on exam. Continue Torsemide 40mg  twice daily. Continue home Isordil 40mg  twice daily. Discharge weight 169.7 lbs.   Demand Ischemia  CAD History of s/p inferior MI in 2016 with cath showing chronically occluded RCA with mild to moderate non-obstructive disease in LAD and LCX. High-sensitivity troponin initially minimally elevated and flat at 19 >> 20. Not consistent with ACS. Felt to be demand ischemia in setting of atrial fibrillation and CHF. She did have an episode of chest pain on 11/09/2019 which improved with 2 dose of sublingual Ntiro. EKG showed no acute ischemic changes. Repeat high-sensitivity troponin negative and lower than previously at 15 >> 16. Repeat chest x-ray also ordered due to crackles on exam and showed mild left basilar atelectasis and/or infiltrate. Chest pain was not felt to be ischemic in nature. She had no recurrent pain. Patient thinks it may be due to anxiety as she has a lot going on at home. Continue statin. Not on Aspirin due to need for Eliquis.  Severe MR TEE on 11/02/2019 showed LVEF of  50% with prolapse of A2 scallop with severe, eccentric mitral valve regurgitation. [name] and Dr. Roxy Manns both saw patient this admission and patient felt to be good MitraClip candidate. Will continue work-up for this as outpatient. She has follow-up with [name] scheduled for 11/21/2019.  Pericardial Effusion Moderate pericardial effusion noted on TEE on 11/02/2019. Repeat outpatient TTE scheduled for 11/21/2019 prior to follow-up visit with [name].   Hypertension BP soft at times but mostly well controlled. Continue Cardizem and Isordil as above.  Hyperlipidemia Continue Lipitor 40mg  daily.  Hypothyroidism Continue home synthroid 150 mcg daily.  Acute on CKD Stage IV Creatinine 2.72 on days of admission. Ranged from 2.5 to 2.77 during admission today. Baseline around 1.9 to 2.3. Can recheck BMET at follow-up visit.  Normocytic Anemia Hemoglobin 8.1 today. Baseline ranges from 8 to 10 range. Likely anemia of chronic disease. Continue home iron at discharge. Can recheck CBC at follow-up. Follow-up with PCP.  Anxiety Patient reports anxiety and requested medication for this. Per Dr. Percival Spanish, will give Ativan at discharge. Reviewed [place] PMP website and patient has no prior prescription history. Will prescribe Ativan 0.5mg  twice daily as needed at discharge. Will give 30 tablets with 0 refills. Patient advised that she will need to follow-up with PCP for this going forward. She voiced understanding and agreed. Of note, I attempted  to e-prescribe per STOP guidelines but [employer] App would not work. Therefore, printed prescription and had it filled in our hospital pharmacy prior to discharge.  Patient seen and examined by Dr. Percival Spanish today and felt to be stable for discharge. Close TOC follow-up has been arranged for 11/16/2019. Recommended repeat BMET, CBC, and EKG at that time. Medications as below.  Did the patient have an acute coronary syndrome (MI, NSTEMI, STEMI, etc) this  admission?:  No.   The elevated Troponin was due to the acute medical illness (demand ischemia).  _____________  Discharge Vitals Blood pressure 133/81, pulse 77, temperature (!) 97.5 F (36.4 C), temperature source Oral, resp. rate 18, height 5\' 4"  (1.626 m), weight 77 kg, SpO2 98 %.  Filed Weights   11/08/19 0425 11/09/19 0545 11/10/19 0638  Weight: 77.6 kg 76.7 kg 77 kg    Labs & Radiologic Studies    CBC Recent Labs    11/09/19 1022 11/10/19 0907  WBC 12.3* 12.8*  HGB 8.5* 8.1*  HCT 26.7* 27.0*  MCV 89.6 90.3  PLT 448* 237*   Basic Metabolic Panel Recent Labs    11/09/19 1022 11/10/19 0907  NA 131* 134*  K 3.8 3.8  CL 95* 96*  CO2 25 23  GLUCOSE 245* 237*  BUN 70* 71*  CREATININE 2.64* 2.72*  CALCIUM 8.4* 8.5*   Liver Function Tests No results for input(s): AST, ALT, ALKPHOS, BILITOT, PROT, ALBUMIN in the last 72 hours. No results for input(s): LIPASE, AMYLASE in the last 72 hours. High Sensitivity Troponin:   Recent Labs  Lab 10/20/19 1734 11/03/19 1115 11/03/19 1400 11/09/19 1413 11/09/19 1818  TROPONINIHS 18* 19* 20* 15 16    BNP Invalid input(s): POCBNP D-Dimer No results for input(s): DDIMER in the last 72 hours. Hemoglobin A1C No results for input(s): HGBA1C in the last 72 hours. Fasting Lipid Panel No results for input(s): CHOL, HDL, LDLCALC, TRIG, CHOLHDL, LDLDIRECT in the last 72 hours. Thyroid Function Tests No results for input(s): TSH, T4TOTAL, T3FREE, THYROIDAB in the last 72 hours.  Invalid input(s): FREET3 _____________  DG Chest 1 View  Result Date: 11/08/2019 CLINICAL DATA:  Left-sided thoracentesis EXAM: CHEST  1 VIEW COMPARISON:  Earlier today FINDINGS: No residual left pleural fluid is seen. No re-expansion edema or visible pneumothorax. Stable hazy density at the right base partially obscured by pacer generator pack. Cardiopericardial enlargement with ICD/pacer leads from the right. IMPRESSION: No evidence of complication or  residual fluid after left thoracentesis. Electronically Signed   By: Monte Fantasia M.D.   On: 11/08/2019 11:49   DG Chest 2 View  Result Date: 11/03/2019 CLINICAL DATA:  Shortness of breath. EXAM: CHEST - 2 VIEW COMPARISON:  October 20, 2019. FINDINGS: Stable cardiomegaly. Right-sided pacemaker is unchanged in position. No pneumothorax is noted. Right lung is clear. Moderate left pleural effusion is noted with probable underlying atelectasis or infiltrate. Bony thorax is unremarkable. IMPRESSION: Moderate left pleural effusion with probable underlying atelectasis or infiltrate. Aortic Atherosclerosis (ICD10-I70.0). Electronically Signed   By: Marijo Conception M.D.   On: 11/03/2019 11:40   DG Chest 2 View  Result Date: 10/20/2019 CLINICAL DATA:  Chest pain EXAM: CHEST - 2 VIEW COMPARISON:  06/24/2019 FINDINGS: Tiny left pleural effusion is present, decreased in size since prior examination. Lungs are well expanded and are otherwise clear. No pneumothorax. Right subclavian pacemaker defibrillator is unchanged. Cardiac size is mildly enlarged, unchanged. Pulmonary vascularity is normal. No acute bone abnormality. IMPRESSION: 1. Tiny left pleural  effusion, decreased in size since prior examination. 2. Stable mild cardiomegaly. Electronically Signed   By: Fidela Salisbury MD   On: 10/20/2019 15:45   DG Chest Port 1 View  Result Date: 11/09/2019 CLINICAL DATA:  Shortness of breath. EXAM: PORTABLE CHEST 1 VIEW COMPARISON:  November 08, 2019 FINDINGS: A dual lead AICD is noted. Mild atelectasis and/or infiltrate is seen within the left lung base. There is no evidence of a pleural effusion or pneumothorax. The cardiac silhouette is markedly enlarged and unchanged in size. There is moderate severity calcification of the aortic arch. Degenerative changes seen throughout the thoracic spine. IMPRESSION: Mild left basilar atelectasis and/or infiltrate. Electronically Signed   By: Virgina Norfolk M.D.   On:  11/09/2019 17:38   DG CHEST PORT 1 VIEW  Result Date: 11/08/2019 CLINICAL DATA:  Shortness of breath with pleural effusion EXAM: PORTABLE CHEST 1 VIEW COMPARISON:  November 05, 2019 FINDINGS: Moderate left pleural effusion noted with atelectasis and consolidation in the left lower lobe region. Note that a portion of the right base is obscured by pacemaker device. There is mild medial right base atelectasis. Right lung otherwise clear. Heart is enlarged with pulmonary vascularity normal, stable. Pacemaker leads attached to right atrium and right ventricle. No adenopathy. No bone lesions. IMPRESSION: Moderate left pleural effusion with atelectasis and probable consolidation left base. Mild atelectasis medial right base. Note that a portion of the right base is obscured by pacemaker device. Stable cardiomegaly. Pacemaker leads attached to right atrium right ventricle. Electronically Signed   By: Lowella Grip III M.D.   On: 11/08/2019 08:07   DG CHEST PORT 1 VIEW  Result Date: 11/05/2019 CLINICAL DATA:  Dyspnea on exertion. EXAM: PORTABLE CHEST 1 VIEW COMPARISON:  11/03/2019 FINDINGS: Right-sided pacemaker unchanged. Slight interval worsening moderate left base opacification likely moderate size effusion with atelectasis. Suggestion of small right pleural effusion with atelectasis. Stable cardiomegaly. Remainder the exam is unchanged. IMPRESSION: 1. Slight interval worsening moderate left base opacification likely moderate size effusion with atelectasis. Suggestion of small right effusion with atelectasis. 2. Stable cardiomegaly. Electronically Signed   By: Marin Olp M.D.   On: 11/05/2019 15:19   ECHO TEE  Result Date: 11/03/2019    TRANSESOPHOGEAL ECHO REPORT   Patient Name:   Frutoso Schatz Date of Exam: 11/02/2019 Medical Rec #:  [id number]      Height:       64.0 in Accession #:    [id number]     Weight:       167.0 lb Date of Birth:  Mar 08, 1929      BSA:          1.812 m Patient Age:    19 years        BP:           128/89 mmHg Patient Gender: F              HR:           105 bpm. Exam Location:  Inpatient Procedure: Transesophageal Echo, 3D Echo, Color Doppler and Cardiac Doppler                               MODIFIED REPORT: This report was modified by Cherlynn Kaiser MD on 11/03/2019 due to revision.  Indications:     Mitral Regurgitation  History:         Patient has prior history of Echocardiogram examinations, most  recent 10/22/2019. CHF, Previous Myocardial Infarction and CAD,                  Pacemaker, Arrythmias:Atrial Fibrillation; Risk                  Factors:Diabetes and Hypertension.  Sonographer:     Mikki Santee RDCS (AE) Referring Phys:  [id number] RHONDA G BARRETT Diagnosing Phys: Cherlynn Kaiser MD PROCEDURE: After discussion of the risks and benefits of a TEE, an informed consent was obtained from the patient. TEE procedure time was 31 minutes. The transesophogeal probe was passed without difficulty through the esophogus of the patient. Imaged were obtained with the patient in a left lateral decubitus position. Local oropharyngeal anesthetic was provided with Cetacaine. Sedation performed by different physician. The patient was monitored while under deep sedation. Anesthestetic sedation was provided intravenously by Anesthesiology: 179.71mg  of Propofol, 100mg  of Lidocaine. Image quality was good. The patient's vital signs; including heart rate, blood pressure, and oxygen saturation; remained stable throughout the procedure. The patient developed no complications during the procedure. IMPRESSIONS  1. Left ventricular ejection fraction, by estimation, is 50%. The left ventricle has low normal function.  2. Right ventricular systolic function is mildly reduced. The right ventricular size is normal.  3. Left atrial size was severely dilated. No left atrial/left atrial appendage thrombus was detected. The LAA emptying velocity was 37 cm/s.  4. Right atrial size was severely  dilated.  5. Moderate pericardial effusion.  6. Prolapse of A2 scallop with severe, eccentric mitral valve regurgitation. Multiple jets. Dominant jet arises at A2 P2 scallop. Dominant jet ERO 0.18, RV 24 mL.     Transcatheter Edge to Edge repair measurements:     PMVL length 9 mm     Flail gap 5 mm     Flail width 5 mm     Coaptation depth 8 mm     Coaptation length 3.4 mm     No significant leaflet calcifications. The mitral valve is degenerative. Findings suggest Carpentier type II primary mitral valve regurgitation.  7. Tricuspid valve regurgitation is mild to moderate.  8. The aortic valve is calcified. There is moderate calcification of the aortic valve. Aortic valve regurgitation is trivial.  9. There is Moderate (Grade III) atheroma plaque involving the transverse and descending aorta. 10. Agitated saline contrast bubble study was negative, with no evidence of any interatrial shunt. FINDINGS  Left Ventricle: Left ventricular ejection fraction, by estimation, is 50%. The left ventricle has low normal function. The left ventricular internal cavity size was normal in size. Right Ventricle: The right ventricular size is normal. No increase in right ventricular wall thickness. Right ventricular systolic function is mildly reduced. Left Atrium: Left atrial size was severely dilated. No left atrial/left atrial appendage thrombus was detected. The LAA emptying velocity was 37 cm/s. Right Atrium: Right atrial size was severely dilated. Pericardium: A moderately sized pericardial effusion is present. Mitral Valve: Prolapse of A2 scallop with severe, eccentric mitral valve regurgitation. Multiple jets. Dominant jet arises at A2 P2 scallop. Dominant jet ERO 0.18, RV 24 mL. Transcatheter Edge to Edge repair measurements: PMVL length 9 mm Flail gap 5 mm Flail width 5 mm Coaptation depth 8 mm Coaptation length 3.4 mm No significant leaflet calcifications. The mitral valve is degenerative in appearance. Findings suggest  Carpentier type II primary mitral valve regurgitation. The mean mitral valve gradient is 2.4 mmHg with average heart rate of 95 bpm. Tricuspid Valve: The tricuspid valve is normal in  structure. Tricuspid valve regurgitation is mild to moderate. Aortic Valve: The aortic valve is calcified. There is moderate calcification of the aortic valve. Aortic valve regurgitation is trivial. Pulmonic Valve: The pulmonic valve was normal in structure. Pulmonic valve regurgitation is trivial. Aorta: The aortic root and ascending aorta are structurally normal, with no evidence of dilitation. There is moderate (Grade III) atheroma plaque involving the transverse and descending aorta. IAS/Shunts: No atrial level shunt detected by color flow Doppler. Agitated saline contrast was given intravenously to evaluate for intracardiac shunting. Agitated saline contrast bubble study was negative, with no evidence of any interatrial shunt. Additional Comments: A pacer wire is visualized in the right atrium and right ventricle.  MITRAL VALVE MV Mean grad: 2.4 mmHg MR Peak grad:   85.4 mmHg MR Mean grad:   57.0 mmHg MR Vmax:        462.00 cm/s MR Vmean:       360.0 cm/s MR PISA:        2.26 cm MR PISA Radius: 0.60 cm Cherlynn Kaiser MD Electronically signed by Cherlynn Kaiser MD Signature Date/Time: 11/03/2019/3:11:56 PM    Final (Updated)    CUP PACEART REMOTE DEVICE CHECK  Result Date: 11/03/2019 Scheduled remote reviewed. Normal device function.  Persistent AF with RVR, on OAC, there is not good ventricular rate control as heart rates are 110-140 bpm.  Sent to triage. Next remote 91 days. Kathy Breach, RN, CCDS, [employer] Scheduled remote reviewed. Normal device function.  Persistent AF with RVR, on OAC, there is not good ventricular rate control as heart rates are 110-140 bpm.  Sent to triage. Next remote 91 days. Kathy Breach, RN, CCDS, [employer]  ECHOCARDIOGRAM LIMITED  Result Date: 10/22/2019    ECHOCARDIOGRAM  LIMITED REPORT   Patient Name:   Frutoso Schatz Date of Exam: 10/22/2019 Medical Rec #:  [id number]      Height:       64.0 in Accession #:    [id number]     Weight:       164.0 lb Date of Birth:  15-Sep-1929      BSA:          1.798 m Patient Age:    61 years       BP:           119/44 mmHg Patient Gender: F              HR:           64 bpm. Exam Location:  Inpatient Procedure: Limited Echo, Cardiac Doppler and Limited Color Doppler                              MODIFIED REPORT: This report was modified by Cherlynn Kaiser MD on 10/22/2019 due to revision.  Indications:     Mitral valve insufficiency 424.0 / 134.0  History:         Patient has prior history of Echocardiogram examinations, most                  recent 05/25/2019. CHF, CAD and Previous Myocardial Infarction,                  Pacemaker and Defibrillator, Mitral Valve Disease,                  Arrythmias:Atrial Fibrillation; Risk Factors:Diabetes and  Hypertension. H/o mitral valve insufficiency.  Sonographer:     Alvino Chapel RCS Referring Phys:  [id number] [name] Diagnosing Phys: Cherlynn Kaiser MD IMPRESSIONS  1. Severe mitral valve regurgitation. Moderate restriction of posterior mitral leaflet, with posteriorly directed eccentric jet. Secondary mitral regurgitation, likely Carpentier type IIIB. Quantitation by PISA suggests moderate regurgitation however there is splaying of color flow Doppler and the dominant jet demonstrates Coanda effect. These finding in combination suggest severe mitral valve regurgitation. The mitral valve is grossly normal. Severe mitral valve regurgitation.  2. Left ventricular ejection fraction, by estimation, is 45 to 50%. The left ventricle has mildly decreased function. The left ventricle demonstrates regional wall motion abnormalities (see scoring diagram/findings for description).  3. Right ventricular systolic function is moderately reduced.  4. Left atrial size was severely dilated.  5. Aortic valve  regurgitation is trivial.  6. The inferior vena cava is dilated in size with <50% respiratory variability, suggesting right atrial pressure of 15 mmHg. Comparison(s): A prior study was performed on 05/25/19. No significant change from prior study. Prior images reviewed side by side. FINDINGS  Left Ventricle: Left ventricular ejection fraction, by estimation, is 45 to 50%. The left ventricle has mildly decreased function. The left ventricle demonstrates regional wall motion abnormalities.  LV Wall Scoring: The basal inferior segment is akinetic. The mid and distal inferior wall is hypokinetic. Right Ventricle: Right ventricular systolic function is moderately reduced. Left Atrium: Left atrial size was severely dilated. Pericardium: A small pericardial effusion is present. Mitral Valve: Severe mitral valve regurgitation. Moderate restriction of posterior mitral leaflet, with posteriorly directed eccentric jet. Secondary mitral regurgitation, likely Carpentier type IIIB. Quantitation by PISA suggests moderate regurgitation however there is splaying of color flow Doppler and the dominant jet demonstrates Coanda effect. These finding in combination suggest severe mitral valve regurgitation. The mitral valve is grossly normal. Severe mitral valve regurgitation. Aortic Valve: Aortic valve regurgitation is trivial. There is moderate calcification of the aortic valve. Venous: A systolic blunting flow pattern is recorded from the right lower pulmonary vein. The inferior vena cava is dilated in size with less than 50% respiratory variability, suggesting right atrial pressure of 15 mmHg. LEFT VENTRICLE PLAX 2D LVOT diam:     1.70 cm LVOT Area:     2.27 cm  LEFT ATRIUM              Index LA Vol (A2C):   116.0 ml 64.51 ml/m LA Vol (A4C):   106.0 ml 58.95 ml/m LA Biplane Vol: 113.0 ml 62.84 ml/m  MR Peak grad:    126.6 mmHg MR Mean grad:    87.0 mmHg   SHUNTS MR Vmax:         562.50 cm/s Systemic Diam: 1.70 cm MR Vmean:         443.5 cm/s MR PISA:         2.26 cm MR PISA Eff ROA: 13 mm MR PISA Radius:  0.60 cm Cherlynn Kaiser MD Electronically signed by Cherlynn Kaiser MD Signature Date/Time: 10/22/2019/6:16:48 PM    Final (Updated)    [place] EKG SITE RITE  Result Date: 11/05/2019 If Site Rite image not attached, placement could not be confirmed due to current cardiac rhythm.  IR THORACENTESIS ASP PLEURAL SPACE W/IMG GUIDE  Result Date: 11/08/2019 INDICATION: Patient with a history of chronic heart failure and recurrent pleural effusions presents today for a therapeutic thoracentesis. EXAM: ULTRASOUND GUIDED THORACENTESIS MEDICATIONS: 1% lidocaine 10 mL COMPLICATIONS: None immediate. PROCEDURE: An ultrasound guided  thoracentesis was thoroughly discussed with the patient and questions answered. The benefits, risks, alternatives and complications were also discussed. The patient understands and wishes to proceed with the procedure. Written consent was obtained. Ultrasound was performed to localize and mark an adequate pocket of fluid in the left chest. The area was then prepped and draped in the normal sterile fashion. 1% Lidocaine was used for local anesthesia. Under ultrasound guidance a 6 Fr Safe-T-Centesis catheter was introduced. Thoracentesis was performed. The catheter was removed and a dressing applied. FINDINGS: A total of approximately 800 mL of light red fluid was removed. IMPRESSION: Successful ultrasound guided left thoracentesis yielding 800 mL of pleural fluid. Read by: Soyla Dryer, NP Electronically Signed   By: Ruthann Cancer MD   On: 11/08/2019 11:59   Disposition   Patient is being discharged home today in good condition.  Follow-up Plans & Appointments     Follow-up Information    [facility] Follow up on 11/17/2019.   Specialty: Cardiology Why: at 1100 am for post hospital follow up. Code for parking is 4007. Hospital entrance C, underground parking on your right. Come up to  the 1st floor via elevator to check in.  Contact information: [address] [id number] [place] [phone]       Burtis Junes, NP Follow up.   Specialties: Nurse Practitioner, Interventional Cardiology, Cardiology, Radiology Why: Hospital follow-up scheduled for 11/16/2019 at 11:15am in our [facility] office. Please arrive 15 minutes early for check-in. Contact information: [place]. [address] [place] [address] [phone]        Sherren Mocha, MD Follow up.   Specialty: Cardiology Why: Follow-up with [name] to discuss valve repair is scheduled for 11/21/2019 at 3:40pm in our [facility] office. Contact information: [address] [place] [address] [phone]              Discharge Instructions    Diet - low sodium heart healthy   Complete by: As directed    Increase activity slowly   Complete by: As directed       Discharge Medications   Allergies as of 11/10/2019      Reactions   Amiodarone Other (See Comments)   Prolonged QT, VT   Aranesp (albumin Free) [darbepoetin Alfa] Other (See Comments)   Patient had cardiac arrest evening after receiving this   Tape Itching, Rash, Other (See Comments)   Reaction was from the surgical tape from cyst removed   Latex Itching      Medication List    STOP taking these medications   Azelastine-Fluticasone 137-50 MCG/ACT Susp   Dilt-XR 120 MG 24 hr capsule Generic drug: diltiazem   metoprolol tartrate 25 MG tablet Commonly known as: LOPRESSOR     TAKE these medications   acetaminophen 650 MG CR tablet Commonly known as: TYLENOL Take 1,300 mg by mouth every 8 (eight) hours as needed for pain.   amiodarone 200 MG tablet Commonly known as: PACERONE Take 1 tablet (200 mg total) by mouth 2 (two) times daily.   anastrozole 1 MG tablet Commonly known as: ARIMIDEX TAKE 1 TABLET BY MOUTH EVERY DAY   apixaban 2.5 MG Tabs  tablet Commonly known as: Eliquis Take 1 tablet (2.5 mg total) by mouth 2 (two) times daily.   atorvastatin 40 MG tablet Commonly known as: LIPITOR TAKE 1 TABLET BY MOUTH EVERY DAY What changed: when to take this   b  complex vitamins capsule Take 1 capsule by mouth daily.   Biotin 1 MG Caps Take 1 mg by mouth daily.   diltiazem 300 MG 24 hr capsule Commonly known as: CARDIZEM CD Take 1 capsule (300 mg total) by mouth daily. Start taking on: November 11, 2019 What changed:   medication strength  how much to take   fluticasone 50 MCG/ACT nasal spray Commonly known as: FLONASE Place 1 spray into both nostrils in the morning and at bedtime.   Iron 325 (65 Fe) MG Tabs Take 325 mg by mouth daily with breakfast.   isosorbide dinitrate 20 MG tablet Commonly known as: ISORDIL TAKE 2 TABLETS (40MG ) BY MOUTH TWICE DAILY What changed: See the new instructions.   levothyroxine 150 MCG tablet Commonly known as: SYNTHROID TAKE 1 TABLET BY MOUTH DAILY BEFORE BREAKFAST. What changed: See the new instructions.   LORazepam 0.5 MG tablet Commonly known as: Ativan Take 1 tablet (0.5 mg total) by mouth 2 (two) times daily as needed for anxiety.   magnesium oxide 400 MG tablet Commonly known as: MAG-OX Take 400 mg by mouth at bedtime.   nitroGLYCERIN 0.4 MG SL tablet Commonly known as: NITROSTAT Place 1 tablet (0.4 mg total) under the tongue every 5 (five) minutes as needed for chest pain.   torsemide 20 MG tablet Commonly known as: DEMADEX Take 2 tablets (40 mg total) by mouth 2 (two) times daily. What changed: how much to take   vitamin C 500 MG tablet Commonly known as: ASCORBIC ACID Take 500 mg by mouth daily.   Vitamin D3 50 MCG (2000 UT) Tabs Take 2,000 Units by mouth at bedtime.          Outstanding Labs/Studies   Repeat EKG, BMET, and CBC at [facility] follow-up visit.  Duration of Discharge Encounter   Greater than 30 minutes including physician  time.  Signed, Darreld Mclean, PA-C 11/10/2019, 3:26 PM

## 2019-11-10 NOTE — TOC Initial Note (Signed)
Transition of Care [facility]) - Initial/Assessment Note    Patient Details  Name: Lauren Walker MRN: [id number] Date of Birth: 25-Jun-1929  [facility] CM/SW Contact:    Hyman Hopes, RN Phone Number: 11/10/2019, 12:19 PM  Clinical Narrative:                 High risk for readmission assessment completed.  Case manager spoke to patient in regards to being high risk for readmission.  Patient states she lives with husband, has no issues obtaining or taking medications, no issues getting to MD appointments (son in law transports), and has cane and walker at home.  Patient states has good support at home.  No home needs identified, and SIL to provide transportation to home.  Expected Discharge Plan: Home/Self Care Barriers to Discharge: No Barriers Identified   Patient Goals and CMS Choice Patient states their goals for this hospitalization and ongoing recovery are:: to go home with husband      Expected Discharge Plan and Services Expected Discharge Plan: Home/Self Care   Discharge Planning Services: CM Consult   Living arrangements for the past 2 months: Single Family Home Expected Discharge Date: 11/10/19                                    Prior Living Arrangements/Services Living arrangements for the past 2 months: Single Family Home Lives with:: Self, Spouse Patient language and need for interpreter reviewed:: Yes Do you feel safe going back to the place where you live?: Yes      Need for Family Participation in Patient Care: Yes (Comment) Care giver support system in place?: Yes (comment)   Criminal Activity/Legal Involvement Pertinent to Current Situation/Hospitalization: No - Comment as needed  Activities of Daily Living Home Assistive Devices/Equipment: Cane (specify quad or straight), Walker (specify type) ADL Screening (condition at time of admission) Patient's cognitive ability adequate to safely complete daily activities?: Yes Is the patient  deaf or have difficulty hearing?: Yes Does the patient have difficulty seeing, even when wearing glasses/contacts?: No Does the patient have difficulty concentrating, remembering, or making decisions?: No Patient able to express need for assistance with ADLs?: Yes Does the patient have difficulty dressing or bathing?: No Independently performs ADLs?: Yes (appropriate for developmental age) Does the patient have difficulty walking or climbing stairs?: Yes Weakness of Legs: Both Weakness of Arms/Hands: None  Permission Sought/Granted Permission sought to share information with : Case Manager Permission granted to share information with : Yes, Verbal Permission Granted              Emotional Assessment Appearance:: Appears stated age Attitude/Demeanor/Rapport: Engaged Affect (typically observed): Appropriate Orientation: : Oriented to Self, Oriented to Place, Oriented to  Time, Oriented to Situation Alcohol / Substance Use: Never Used Psych Involvement: No (comment)  Admission diagnosis:  Atrial fibrillation ([place]) [I48.91] Persistent atrial fibrillation ([place]) [I48.19] Pleural effusion, left [J90] Patient Active Problem List   Diagnosis Date Noted  . Atrial fibrillation with RVR ([place]) 10/22/2019  . Atrial fibrillation with rapid ventricular response ([place])   . Chest pain of uncertain etiology 28/31/5176  . Atrial fibrillation ([place]) 10/21/2019  . Arthritis of left hip 09/19/2019  . ICD (implantable cardioverter-defibrillator) in place 07/06/2019  . Pleural effusion on left 06/29/2019  . Pacemaker 06/29/2019  . V-tach ([place]) 06/17/2019  . Sinus bradycardia 06/17/2019  . Transaminitis 06/17/2019  . Acute CHF (congestive  heart failure) ([place]) 05/25/2019  . CKD stage G4/A1, GFR 15-29 and albumin creatinine ratio <30 mg/g ([facility]) 05/17/2019  . Anemia associated with stage 4 chronic renal failure ([place]) 05/17/2019  . Malignant neoplasm of upper-outer quadrant of left breast in female,  estrogen receptor positive ([place]) 03/02/2019  . Aortic stenosis 02/14/2019  . Mitral regurgitation moderate to severe based on echocardiogram in summer 2020 11/04/2018  . Paroxysmal atrial fibrillation ([place]) 10/11/2018  . Hyperlipidemia associated with type 2 diabetes mellitus ([place]) 08/16/2018  . Type 2 diabetes mellitus with other circulatory complications ([place]) 02/77/4128  . Hypothyroid 08/16/2018  . HTN (hypertension) 08/16/2018  . CAD (coronary artery disease) 08/16/2018  . History of MI (myocardial infarction) 08/16/2018   PCP:  Midge Minium, MD Pharmacy:   [facility], [address] [place] [address] Phone: [phone] Fax: [phone]  [facility] [place] [place] - [address] [place] [address] Phone: [phone] Fax: [phone]     Social Determinants of Health (SDOH) Interventions    Readmission Risk Interventions Readmission Risk Prevention Plan 11/10/2019 05/31/2019  Transportation Screening Complete Complete  PCP or Specialist Appt within 3-5 Days - Complete  [place]RI or [place] - Complete  Social Work Consult for [place] Planning/Counseling - Complete  Palliative Care Screening - Not Applicable  Medication Review Press photographer) Complete Complete  PCP or Specialist appointment within 3-5 days of discharge Complete -  [place] or [place] Complete -  SW Recovery Care/Counseling Consult Complete -  Palliative Care Screening Not Applicable -  [place] Not Applicable -

## 2019-11-10 NOTE — Progress Notes (Signed)
Physical Therapy Treatment Patient Details Name: Lauren Walker MRN: [id number] DOB: 04/30/1929 Today's Date: 11/10/2019    History of Present Illness Patient is a 86 y/o female who presents with SOB, abdominal pain and edema. Found to be in A-fib with RVR and admitted with acute on chronic diastolic and systolic heart failure. CXR-left pleural effusion. s/p cardioversion 9/20, s/p thoracentesis 9/21. PMH includes DM, CAD, CKD, breast ca, MI, HTN, HLD, aortic stenosis.    PT Comments    Pt eager to get home to her dog today. Reluctantly agreeable to ambulation. Pt min guard for transfers and supervision for ambulation with RW. Pt HR 106-142 with ambulation.  D/c plan remains appropriate.   Follow Up Recommendations  No PT follow up;Supervision - Intermittent     Equipment Recommendations  None recommended by PT    Recommendations for Other Services       Precautions / Restrictions Precautions Precautions: Fall Precaution Comments: watch HR Restrictions Weight Bearing Restrictions: No    Mobility  Bed Mobility               General bed mobility comments: sitting EoB on entry   Transfers Overall transfer level: Needs assistance Equipment used: Rolling walker (2 wheeled) Transfers: Sit to/from Stand Sit to Stand: Min guard         General transfer comment: Min guard for safety, unhappy Rollator not available for treatment session   Ambulation/Gait Ambulation/Gait assistance: Supervision Gait Distance (Feet): 60 Feet Assistive device: Rolling walker (2 wheeled) Gait Pattern/deviations: Step-through pattern;Decreased stride length Gait velocity: slowed   General Gait Details: slow, mildly unsteady, gait, vc for upright posture, and proximity to RW          Balance Overall balance assessment: Mild deficits observed, not formally tested                                          Cognition Arousal/Alertness: Awake/alert Behavior During  Therapy: WFL for tasks assessed/performed Overall Cognitive Status: Within Functional Limits for tasks assessed                                           General Comments General comments (skin integrity, edema, etc.): HR 106-142bpm, SaO2 on RA >96%O2      Pertinent Vitals/Pain Pain Assessment: No/denies pain           PT Goals (current goals can now be found in the care plan section) Acute Rehab PT Goals Patient Stated Goal: to rest PT Goal Formulation: With patient Time For Goal Achievement: 11/22/19 Potential to Achieve Goals: Good Progress towards PT goals: Progressing toward goals    Frequency    Min 3X/week      PT Plan Current plan remains appropriate       AM-PAC PT "6 Clicks" Mobility   Outcome Measure  Help needed turning from your back to your side while in a flat bed without using bedrails?: None Help needed moving from lying on your back to sitting on the side of a flat bed without using bedrails?: A Little Help needed moving to and from a bed to a chair (including a wheelchair)?: A Little Help needed standing up from a chair using your arms (e.g., wheelchair or bedside chair)?: A Little Help needed to walk  in hospital room?: None Help needed climbing 3-5 steps with a railing? : A Little 6 Click Score: 20    End of Session Equipment Utilized During Treatment: Gait belt Activity Tolerance: Patient limited by fatigue Patient left: in bed;with call bell/phone within reach;with family/visitor present (sitting EOB) Nurse Communication: Mobility status;Other (comment) (HR) PT Visit Diagnosis: Difficulty in walking, not elsewhere classified (R26.2);Other (comment) (DOE)     Time: 3267-1245 PT Time Calculation (min) (ACUTE ONLY): 18 min  Charges:  $Gait Training: 8-22 mins                     Asmaa Tirpak B. Migdalia Dk PT, DPT Acute Rehabilitation Services Pager [phone] Office [phone]    [place] 11/10/2019, 12:47 PM

## 2019-11-10 NOTE — Progress Notes (Addendum)
Progress Note  Patient Name: Lauren Walker Date of Encounter: 11/10/2019  [facility] Cardiologist: Jenne Campus, MD   Subjective   Patient had episodes of chest pain at rest yesterday which she thinks may be related to anxiety as she states she has a lot going on at home. No recurrent chest pain since yesterday. She feels like her breathing is stable but better since thoracentesis. Spoke with RN who states she did receive report that she continues to have episodes of shortness of breath at night. Repeat chest x-ray showed mild left basilar atelectasis and/or infiltrate. She remains in atrial fibrillation with rates in the low 100's to 110's while at rest this morning. No palpitations, lightheadedness or dizziness.  Inpatient Medications    Scheduled Meds: . amiodarone  200 mg Oral BID  . apixaban  2.5 mg Oral BID  . atorvastatin  40 mg Oral Daily  . diltiazem  240 mg Oral Daily  . fluticasone  1 spray Each Nare Daily  . influenza vaccine adjuvanted  0.5 mL Intramuscular Tomorrow-1000  . insulin aspart  0-5 Units Subcutaneous QHS  . insulin aspart  0-9 Units Subcutaneous TID WC  . isosorbide dinitrate  40 mg Oral BID  . levothyroxine  150 mcg Oral QAC breakfast  . melatonin  3 mg Oral QHS  . polyethylene glycol  17 g Oral Daily  . torsemide  40 mg Oral BID   Continuous Infusions:  PRN Meds: lidocaine, nitroGLYCERIN, senna-docusate   Vital Signs    Vitals:   11/09/19 1727 11/09/19 2120 11/10/19 0633 11/10/19 0638  BP: 124/68 (!) 144/59 140/80   Pulse: 99 78 94   Resp: (!) 22 18 20    Temp: 97.9 F (36.6 C) 97.6 F (36.4 C) (!) 97.5 F (36.4 C)   TempSrc: Oral Oral Oral   SpO2: 98% 97% 97%   Weight:    77 kg  Height:        Intake/Output Summary (Last 24 hours) at 11/10/2019 0755 Last data filed at 11/09/2019 1900 Gross per 24 hour  Intake 720 ml  Output 350 ml  Net 370 ml   Last 3 Weights 11/10/2019 11/09/2019 11/08/2019  Weight (lbs) 169 lb 11.2 oz 169  lb 3.2 oz 171 lb  Weight (kg) 76.975 kg 76.749 kg 77.565 kg      Telemetry    Atrial fibrillation with rates ranging from 80's to 110's. Rates in the 100's to 110's this morning while at rest. PVCs also noted. - Personally Reviewed  ECG    No new ECG tracing today. - Personally Reviewed  Physical Exam   GEN: No acute distress.   Neck: Supple. No JVD. Cardiac: Irregularly irregular rhythm. Soft systolic murmur. No rubs or gallops.  Respiratory: Clear to auscultation bilaterally. GI: Soft, non-tender, non-distended  MS: No lower extremity edema. No deformity. Neuro:  No focal deficits. Psych: Normal affect. Responds appropriately.  Labs    High Sensitivity Troponin:   Recent Labs  Lab 10/20/19 1734 11/03/19 1115 11/03/19 1400 11/09/19 1413 11/09/19 1818  TROPONINIHS 18* 19* 20* 15 16      Chemistry Recent Labs  Lab 11/03/19 1115 11/04/19 1049 11/07/19 0258 11/08/19 0228 11/09/19 1022  NA 133*   < > 128* 133* 131*  K 4.3   < > 3.3* 3.5 3.8  CL 96*   < > 91* 95* 95*  CO2 24   < > 24 25 25   GLUCOSE 302*   < > 274* 139* 245*  BUN 84*   < > 77* 73* 70*  CREATININE 2.50*   < > 2.56* 2.58* 2.64*  CALCIUM 8.6*   < > 8.4* 8.5* 8.4*  PROT 6.7  --   --   --   --   ALBUMIN 3.2*  --   --   --   --   AST 16  --   --   --   --   ALT 16  --   --   --   --   ALKPHOS 58  --   --   --   --   BILITOT 0.5  --   --   --   --   GFRNONAA 17*   < > 16* 16* 16*  GFRAA 19*   < > 19* 19* 18*  ANIONGAP 13   < > 13 13 11    < > = values in this interval not displayed.     Hematology Recent Labs  Lab 11/03/19 1115 11/09/19 1022  WBC 14.2* 12.3*  RBC 3.13* 2.98*  HGB 9.0* 8.5*  HCT 28.5* 26.7*  MCV 91.1 89.6  MCH 28.8 28.5  MCHC 31.6 31.8  RDW 13.6 14.0  PLT 520* 448*    BNP Recent Labs  Lab 11/03/19 1115  BNP 956.3*     DDimer No results for input(s): DDIMER in the last 168 hours.   Radiology    DG Chest 1 View  Result Date: 11/08/2019 CLINICAL DATA:   Left-sided thoracentesis EXAM: CHEST  1 VIEW COMPARISON:  Earlier today FINDINGS: No residual left pleural fluid is seen. No re-expansion edema or visible pneumothorax. Stable hazy density at the right base partially obscured by pacer generator pack. Cardiopericardial enlargement with ICD/pacer leads from the right. IMPRESSION: No evidence of complication or residual fluid after left thoracentesis. Electronically Signed   By: Monte Fantasia M.D.   On: 11/08/2019 11:49   DG Chest Port 1 View  Result Date: 11/09/2019 CLINICAL DATA:  Shortness of breath. EXAM: PORTABLE CHEST 1 VIEW COMPARISON:  November 08, 2019 FINDINGS: A dual lead AICD is noted. Mild atelectasis and/or infiltrate is seen within the left lung base. There is no evidence of a pleural effusion or pneumothorax. The cardiac silhouette is markedly enlarged and unchanged in size. There is moderate severity calcification of the aortic arch. Degenerative changes seen throughout the thoracic spine. IMPRESSION: Mild left basilar atelectasis and/or infiltrate. Electronically Signed   By: Virgina Norfolk M.D.   On: 11/09/2019 17:38   IR THORACENTESIS ASP PLEURAL SPACE W/IMG GUIDE  Result Date: 11/08/2019 INDICATION: Patient with a history of chronic heart failure and recurrent pleural effusions presents today for a therapeutic thoracentesis. EXAM: ULTRASOUND GUIDED THORACENTESIS MEDICATIONS: 1% lidocaine 10 mL COMPLICATIONS: None immediate. PROCEDURE: An ultrasound guided thoracentesis was thoroughly discussed with the patient and questions answered. The benefits, risks, alternatives and complications were also discussed. The patient understands and wishes to proceed with the procedure. Written consent was obtained. Ultrasound was performed to localize and mark an adequate pocket of fluid in the left chest. The area was then prepped and draped in the normal sterile fashion. 1% Lidocaine was used for local anesthesia. Under ultrasound guidance a 6 Fr  Safe-T-Centesis catheter was introduced. Thoracentesis was performed. The catheter was removed and a dressing applied. FINDINGS: A total of approximately 800 mL of light red fluid was removed. IMPRESSION: Successful ultrasound guided left thoracentesis yielding 800 mL of pleural fluid. Read by: Soyla Dryer, NP Electronically Signed  By: Ruthann Cancer MD   On: 11/08/2019 11:59    Cardiac Studies   TTE 10/22/2019: Impressions: 1. Severe mitral valve regurgitation. Moderate restriction of posterior  mitral leaflet, with posteriorly directed eccentric jet. Secondary mitral  regurgitation, likely Carpentier type IIIB. Quantitation by PISA suggests  moderate regurgitation however  there is splaying of color flow Doppler and the dominant jet demonstrates  Coanda effect. These finding in combination suggest severe mitral valve  regurgitation. The mitral valve is grossly normal. Severe mitral valve  regurgitation.  2. Left ventricular ejection fraction, by estimation, is 45 to 50%. The  left ventricle has mildly decreased function. The left ventricle  demonstrates regional wall motion abnormalities (see scoring  diagram/findings for description).  3. Right ventricular systolic function is moderately reduced.  4. Left atrial size was severely dilated.  5. Aortic valve regurgitation is trivial.  6. The inferior vena cava is dilated in size with <50% respiratory  variability, suggesting right atrial pressure of 15 mmHg.   Comparison(s): A prior study was performed on 05/25/19. No significant  change from prior study. Prior images reviewed side by side. _______________   TEE 11/02/2019: Impressions: 1. Left ventricular ejection fraction, by estimation, is 50%. The left  ventricle has low normal function.  2. Right ventricular systolic function is mildly reduced. The right  ventricular size is normal.  3. Left atrial size was severely dilated. No left atrial/left atrial  appendage  thrombus was detected. The LAA emptying velocity was 37 cm/s.  4. Right atrial size was severely dilated.  5. Moderate pericardial effusion.  6. Prolapse of A2 scallop with severe, eccentric mitral valve  regurgitation. Multiple jets. Dominant jet arises at A2 P2 scallop.  Dominant jet ERO 0.18, RV 24 mL.   Transcatheter Edge to Edge repair measurements:   PMVL length 9 mm   Flail gap 5 mm   Flail width 5 mm   Coaptation depth 8 mm   Coaptation length 3.4 mm   No significant leaflet calcifications. The mitral valve is  degenerative. Findings suggest Carpentier type II primary mitral valve  regurgitation.  7. Tricuspid valve regurgitation is mild to moderate.  8. The aortic valve is calcified. There is moderate calcification of the  aortic valve. Aortic valve regurgitation is trivial.  9. There is Moderate (Grade III) atheroma plaque involving the transverse  and descending aorta.  10. Agitated saline contrast bubble study was negative, with no evidence  of any interatrial shunt.   Patient Profile     86 y.o. female with a history of CAD s/p inferior MI in 2016 with chronic occlusion of RCA and mild to moderate non-obstruction disease in LAD and LCX, chronic combined CHF, persistent atrial fibrillation, VT felt to be secondary to Amiodarone s/p ICD, mitral regurgitation, hypertension, hyperlipidemia, type 2 diabetes mellitus, hypothyroidism, and CKD stage IV who was admitted for atrial fibrillation and CHF.  Assessment & Plan    Persistent Atrial Fibrillation - History of multiple failed cardioversion in the past. EP consulted and recommend retrial of Amiodarone. Underwent DCCV on 11/07/2019 but unfortunately converted back to atrial fibrillation yesterday. - Remains in atrial fibrillation with rates in the 100's to 110's at rest this morning. - Continue Amiodarone 200mg  twice daily. - Will increase Cardizem to 300mg  daily. Could also consider adding  beta-blocker. - Continue Eliquis 2.5mg  twice daily (reduced dose due to age and renal function). - Spoke with EP who recommended continue with current plans for rate control. Would not try another  DCCV as this would delay plans for MitraClip.  Acute on Chronic Diastolic CHF - BNP elevated at 956.3. - Chest x-ray showed left sided pleural effusion. - LVEF 50% on TEE. - Patient was initially diuresed with IV Lasix drip but was switched to home Torsemide on 11/08/2019. Diuresed 548 this admission. Also s/p thoracentesis with removal of 800 mL of light red fluid on 11/08/2019. Weight basically stable.  - Appears euvolemic on exam. - Continue Torsemide 40mg  twice daily. - Continue to monitor daily weights, strict I/O's, and renal function.  Chest Pain CAD - s/p inferior MI in 2016 with cath showing chronically occluded RCA with mild to moderate non-obstructive disease in LAD and LCX. - Patient had episodes of chest pain yesterday which she attributes to anxiety. EKG showed no acute ischemic changes. Repeat high-sensitivity troponin negative and lower than previously at 15 >> 16. Do not suspect ischemia. - Continue statin. Not on Aspirin due to need for Eliquis.  Severe MR - TEE on 11/02/2019 showed LVEF of 50% with severe MR. - Structural heart team saw and plan is for transcatheter MVR and further work-up as outpatient. CT surgery consulted who agreed with Mitraclip.  Pericardial Effusion - Moderate pericardial effusion on TEE on 11/02/2019.  - Plan is for repeat Echo next month as outpatient.  Hyperlipidemia - Continue Lipitor 40mg  daily.  CKD Stage IV - Creatinine 2.64 today. Baseline around 2.0 to 2.72. - Continue to monitor closely  Normocytic Anemia - Hemoglobin 8.1  today. Baseline ranges from 8 to 10 range. - Suspect likely anemia of chronic disease.   For questions or updates, please contact [place] Please consult [email or web address] for contact info under         Signed, Darreld Mclean, PA-C  11/10/2019, 7:55 AM    History and all data above reviewed.  Patient examined.  I agree with the findings as above.    She was anxious last night and got Ativan and did much better.  Her O2 sats with ambulation have been in the mid 90s.  She has had no further chest pain.  Atrial fib rate was in the 90s at rest.   The patient exam reveals [id number], murmur unchanged  ,  Lungs: Few basilar coarse crackles  ,  OHY:[id number] bowel sounds, no rebound no guarding  Ext:  Mild edema  .  All available labs, radiology testing, previous records reviewed. Agree with documented assessment and plan.   Atrial fib:  Rate is reasonably controlled and we can follow this at home.  I agree with increasing the Cardizem.   We can get her a TOC visit and lab draw with Jenne Campus, MD next week.  She also has follow up with [name] early OctJeneen Rinks [facility]  10:38 AM  11/10/2019

## 2019-11-11 ENCOUNTER — Telehealth: Payer: Self-pay | Admitting: *Deleted

## 2019-11-11 NOTE — Telephone Encounter (Signed)
LMOVM requesting call back to DC.  Direct number and office hours provided.   Merlin alert received for ongoing AT/AF episode since 11/08/19.  V rates elevated at times.  Per hospital discharge summary from 11/10/19, plan to pursue rate control.  Prescribed Eliquis, amiodarone 200mg  BID, and diltiazem CD 300mg  daily.  Will confirm taking all medications as prescribed.

## 2019-11-11 NOTE — Progress Notes (Signed)
CARDIOLOGY OFFICE NOTE  Date:  11/16/2019    Lauren Walker Date of Birth: Jul 21, 1929 Medical Record #[id number]  PCP:  Midge Minium, MD  Cardiologist:  Agustin Cree  Chief Complaint  Patient presents with  . Hospitalization Follow-up    Seen for Dr. Dois Davenport    History of Present Illness: Lauren Walker is a 86 y.o. female who presents today for a post hospital/TOC visit. Seen for Dr. Agustin Cree.   She has a history of breast CA only on hormonal therapy, chronic both systolic and diastolic congestive heart failure, VT noted to be due to amiodarone and prolonged QT - status post ICD implant, remote history of CAD with inferior MI in 2016 with cath showing chronically occluded RCA with mild to moderate non obstructive disease in the LAD and LCX, PAF, moderate to severe mitral regurgitation, CKD, HTN, HLD and hypothyroidism.   She had an admission back in April - had long post termination pause (after IV Lopressor) - started on Amiodarone with attempt to maintain NSR and avoid pacing - but then in May - had syncope - EMS found her in VT and gave her amiodarone bolus and shocked her - QT was marked prolonged and the amiodarone was stopped - she was bradycardic with rates in the 20's to 30;'s and had ICD implanted then.   Seen earlier this month by Dr. Agustin Cree with complaints of chest tightness.  Found to be in AF with RVR. Transferred to the [place] ER. Then admitted. She was cardioverted but unsuccessful. Eliquis was continued. Noted she is being worked up for MitraClip given her MR.  She was then started on IV Cardizem but continued to go in and out of atrial fibrillation. No amiodarone due to prior VT that was felt to be related to amiodarone use and QT prolongation however, consulted with EP and she was restarted on Amiodarone - no plans to cardiovert since this would delay her MitraClip - CCB was also uptitrated. Echo with EF 45 to 50% and severe MR. Did have her  TEE.   Seeing [name] in regards to MitraClip on Monday - October 4th with echocardiogram prior.   Comes in today. Here with her husband. Has been home 6 days. Remains always short of breath. She says today is a bit better. Some palpitations. No passing out. No falls. Lots of bruising. Hard to get comfortable to sleep - sleeping "bent over" basically. Not dizzy or lightheaded. BP is 120's at home as well. She says her weight is stable on her scales. She is eating better. Pretty sedentary now.   Past Medical History:  Diagnosis Date  . Acute CHF (congestive heart failure) ([place]) 05/25/2019  . Anemia associated with stage 4 chronic renal failure ([place]) 05/17/2019  . Aortic stenosis 02/14/2019  . Arthritis   . CAD (coronary artery disease) 08/16/2018  . Chronic kidney disease   . Chronic systolic dysfunction of left ventricle   . CKD stage G4/A1, GFR 15-29 and albumin creatinine ratio <30 mg/g (HCC) 05/17/2019  . Coronary artery disease   . Diabetes mellitus without complication ([place])   . Diverticulitis   . History of MI (myocardial infarction) 08/16/2018  . HTN (hypertension) 08/16/2018  . Hyperlipidemia associated with type 2 diabetes mellitus ([place]) 08/16/2018  . Hypertension   . Hypothyroid 08/16/2018  . Hypothyroidism   . ICD (implantable cardioverter-defibrillator) in place 07/06/2019  . Malignant neoplasm of upper-outer quadrant of left breast in female, estrogen receptor positive (  [place]) 03/02/2019  . Mitral regurgitation moderate to severe based on echocardiogram in summer 2020 11/04/2018  . Myocardial infarction ([place])   . Pacemaker 06/29/2019  . Paroxysmal atrial fibrillation (H[facility])   . Pleural effusion on left 06/29/2019  . Sinus bradycardia 06/17/2019  . Thyroid disease   . Transaminitis 06/17/2019  . Type 2 diabetes mellitus with other circulatory complications ([place]) 9/93/7169  . [facility]) 06/17/2019    Past Surgical History:  Procedure Laterality Date  . APPENDECTOMY  1936  .  BUBBLE STUDY  11/02/2019   Procedure: BUBBLE STUDY;  Surgeon: [name], MD;  Location: [place];  Service: Cardiology;;  . CARDIOVERSION N/A 11/07/2019   Procedure: CARDIOVERSION;  Surgeon: Dorothy Spark, MD;  Location: [place];  Service: Cardiovascular;  Laterality: N/A;  . ICD IMPLANT N/A 06/20/2019   Procedure: ICD IMPLANT;  Surgeon: Evans Lance, MD;  Location: [place] CV LAB;  Service: Cardiovascular;  Laterality: N/A;  . IR THORACENTESIS ASP PLEURAL SPACE W/IMG GUIDE  05/27/2019  . IR THORACENTESIS ASP PLEURAL SPACE W/IMG GUIDE  11/08/2019  . PARS PLANA VITRECTOMY Right 12/04/2018   Procedure: RETINAL DETACHMENT REPAIR PPV 25 GAUGE WITH ENDO LASER AIR/FLUID EXCHANGE SF6 GAS INJECTION;  Surgeon: Jalene Mullet, MD;  Location: [place];  Service: Ophthalmology;  Laterality: Right;  . TEE WITHOUT CARDIOVERSION N/A 11/02/2019   Procedure: TRANSESOPHAGEAL ECHOCARDIOGRAM (TEE);  Surgeon: [name], MD;  Location: [place];  Service: Cardiology;  Laterality: N/A;  . TONSILLECTOMY       Medications: Current Meds  Medication Sig  . acetaminophen (TYLENOL) 650 MG CR tablet Take 1,300 mg by mouth every 8 (eight) hours as needed for pain.   Marland Kitchen amiodarone (PACERONE) 200 MG tablet Take 1 tablet (200 mg total) by mouth 2 (two) times daily.  Marland Kitchen anastrozole (ARIMIDEX) 1 MG tablet TAKE 1 TABLET BY MOUTH EVERY DAY  . apixaban (ELIQUIS) 2.5 MG TABS tablet Take 1 tablet (2.5 mg total) by mouth 2 (two) times daily.  Marland Kitchen atorvastatin (LIPITOR) 40 MG tablet TAKE 1 TABLET BY MOUTH EVERY DAY  . b complex vitamins capsule Take 1 capsule by mouth daily.  . Biotin 1 MG CAPS Take 1 mg by mouth daily.   . Cholecalciferol (VITAMIN D3) 50 MCG (2000 UT) TABS Take 2,000 Units by mouth at bedtime.   Marland Kitchen diltiazem (CARDIZEM CD) 300 MG 24 hr capsule Take 1 capsule (300 mg total) by mouth daily.  . Ferrous Sulfate (IRON) 325 (65 Fe) MG TABS Take 325 mg by mouth daily with breakfast.   .  fluticasone (FLONASE) 50 MCG/ACT nasal spray Place 1 spray into both nostrils in the morning and at bedtime.  . isosorbide dinitrate (ISORDIL) 20 MG tablet TAKE 2 TABLETS (40MG ) BY MOUTH TWICE DAILY  . levothyroxine (SYNTHROID) 150 MCG tablet TAKE 1 TABLET BY MOUTH DAILY BEFORE BREAKFAST.  Marland Kitchen LORazepam (ATIVAN) 0.5 MG tablet Take 1 tablet (0.5 mg total) by mouth 2 (two) times daily as needed for anxiety.  . magnesium oxide (MAG-OX) 400 MG tablet Take 400 mg by mouth at bedtime.   . nitroGLYCERIN (NITROSTAT) 0.4 MG SL tablet Place 1 tablet (0.4 mg total) under the tongue every 5 (five) minutes as needed for chest pain.  Marland Kitchen torsemide (DEMADEX) 20 MG tablet Take 2 tablets (40 mg total) by mouth 2 (two) times daily.  . vitamin C (ASCORBIC ACID) 500 MG tablet Take 500 mg by mouth daily.     Allergies: Allergies  Allergen Reactions  .  Amiodarone Other (See Comments)    Prolonged QT, VT  . Aranesp (Albumin Free) [Darbepoetin Alfa] Other (See Comments)    Patient had cardiac arrest evening after receiving this  . Tape Itching, Rash and Other (See Comments)    Reaction was from the surgical tape from cyst removed  . Latex Itching    Social History: The patient  reports that she has quit smoking. She has never used smokeless tobacco. She reports current alcohol use. She reports previous drug use.   Family History: The patient's family history includes Arthritis in her daughter, father, mother, and son; Depression in her maternal aunt; Heart attack in her father; Heart disease in her father and mother; Hyperlipidemia in her maternal aunt; Hypertension in her maternal aunt and mother.   Review of Systems: Please see the history of present illness.   All other systems are reviewed and negative.   Physical Exam: VS:  BP 120/80   Pulse (!) 111   Ht 5\' 4"  (1.626 m)   Wt 170 lb 9.6 oz (77.4 kg)   SpO2 97%   BMI 29.28 kg/m  .  BMI Body mass index is 29.28 kg/m.  Wt Readings from Last 3  Encounters:  11/16/19 170 lb 9.6 oz (77.4 kg)  11/10/19 169 lb 11.2 oz (77 kg)  11/02/19 167 lb (75.8 kg)    General: Alert. She is in a wheelchair. Looks very chronically ill. She is in no acute distress. Color pretty sallow.  Cardiac: Regular rate and rhythm. Loud systolic murmur. 1+ edema - has compression stockings on. Toes are swollen. Arms are swollen.  Respiratory:  Lungs are fairly clear to auscultation bilaterally with normal work of breathing.  GI: Soft and nontender.  MS: No deformity or atrophy. Gait not tested. Skin: Warm and dry. Color is rather sallow. She has lots of bruising/purpura almost on her arms and upper chest.  Neuro:  Strength and sensation are intact and no gross focal deficits noted.  Psych: Alert, appropriate and with normal affect.   LABORATORY DATA:  EKG:  EKG is ordered today.  Personally reviewed by me. This demonstrates AF - rate is not idea at 111 - reviewed with Dr. Angelena Form.  Lab Results  Component Value Date   WBC 12.8 (H) 11/10/2019   HGB 8.1 (L) 11/10/2019   HCT 27.0 (L) 11/10/2019   PLT 454 (H) 11/10/2019   GLUCOSE 237 (H) 11/10/2019   CHOL 109 12/29/2018   TRIG 154.0 (H) 12/29/2018   HDL 48.40 12/29/2018   LDLCALC 30 12/29/2018   ALT 16 11/03/2019   AST 16 11/03/2019   NA 134 (L) 11/10/2019   K 3.8 11/10/2019   CL 96 (L) 11/10/2019   CREATININE 2.72 (H) 11/10/2019   BUN 71 (H) 11/10/2019   CO2 23 11/10/2019   TSH 1.130 05/25/2019   INR 1.8 (H) 11/04/2019   HGBA1C 7.3 (H) 11/04/2019     BNP (last 3 results) Recent Labs    06/24/19 1120 10/20/19 1544 11/03/19 1115  BNP 1,487.1* 516.6* 956.3*    ProBNP (last 3 results) Recent Labs    08/03/19 1340 08/23/19 1317 09/02/19 1308  PROBNP 10,016* 7,756* 12,348*     Other Studies Reviewed Today:  ECHO: 10/22/2019 1. Severe mitral valve regurgitation. Moderate restriction of posterior  mitral leaflet, with posteriorly directed eccentric jet. Secondary mitral    regurgitation, likely Carpentier type IIIB. Quantitation by PISA suggests moderate regurgitation however there is splaying of color flow Doppler and the dominant jet  demonstrates Coanda effect. These finding in combination suggest severe mitral valve regurgitation. The mitral valve is grossly normal. Severe mitral valve regurgitation.  2. Left ventricular ejection fraction, by estimation, is 45 to 50%. The left ventricle has mildly decreased function. The left ventricle demonstrates regional wall motion abnormalities (see scoring  diagram/findings for description).  3. Right ventricular systolic function is moderately reduced.  4. Left atrial size was severely dilated.  5. Aortic valve regurgitation is trivial.  6. The inferior vena cava is dilated in size with <50% respiratory variability, suggesting right atrial pressure of 15 mmHg.      ASSESSMENT & PLAN:    1. AF with RVR - she is back on amiodarone - QT today is 406. She is also on fairly high dose of CCB. Suspect this is reflection of her severe MR.   2. Severe MR - seeing [name] on Monday with echo prior.   3. CAD - no active chest pain. She had prior inferior MI in 2016 with cath showing chronically occluded RCA with mild to moderate non obstructive disease in the LAD and LCX.   4. HLD - not discussed.   5. HTN - BP soft - probably not too much room to titrate further. Had had Lopressor held during her admission due to this.   6. DM  7. Prior VT/syncope/bradycardia in the setting of amiodarone therapy with prolonged QT - has underlying iCD in place - see #1 - she is back on amiodarone - remains in AF. She has had multiple failed attempts at cardioversion. QT currently stable.   8. High risk medicine - needs follow up labs today.   9. Chronic anticoagulation - on the lower dose of Eliquis - she is also anemic.   10. Acute on chronic diastolic HF - remains on diuretics with Torsemide - lab today.   11. Prior  pericardial effusion - for repeat study on Monday.   12. CKD   13. Anemia - on iron therapy.   The following changes have been made:  See above.  Labs/ tests ordered today include:    Orders Placed This Encounter  Procedures  . Basic metabolic panel  . CBC  . Hepatic function panel  . TSH  . Magnesium  . EKG 12-Lead     Disposition:   FU with [name] on Monday with echo prior. Labs today. Her overall prognosis looks very tenuous to me. No changes were made today with her current regimen.    Patient is agreeable to this plan and will call if any problems develop in the interim.   SignedTruitt Merle, NP  11/16/2019 11:47 AM  [facility] [address] [place], [place]  [address] Phone: [phone] Fax: [phone]

## 2019-11-16 ENCOUNTER — Other Ambulatory Visit: Payer: Self-pay

## 2019-11-16 ENCOUNTER — Ambulatory Visit: Payer: Medicare Other | Admitting: Nurse Practitioner

## 2019-11-16 ENCOUNTER — Ambulatory Visit (INDEPENDENT_AMBULATORY_CARE_PROVIDER_SITE_OTHER): Payer: Medicare Other | Admitting: Nurse Practitioner

## 2019-11-16 ENCOUNTER — Encounter: Payer: Self-pay | Admitting: Nurse Practitioner

## 2019-11-16 VITALS — BP 120/80 | HR 111 | Ht 64.0 in | Wt 170.6 lb

## 2019-11-16 DIAGNOSIS — E1169 Type 2 diabetes mellitus with other specified complication: Secondary | ICD-10-CM | POA: Diagnosis not present

## 2019-11-16 DIAGNOSIS — E785 Hyperlipidemia, unspecified: Secondary | ICD-10-CM

## 2019-11-16 DIAGNOSIS — I10 Essential (primary) hypertension: Secondary | ICD-10-CM

## 2019-11-16 DIAGNOSIS — I4891 Unspecified atrial fibrillation: Secondary | ICD-10-CM | POA: Diagnosis not present

## 2019-11-16 DIAGNOSIS — I2510 Atherosclerotic heart disease of native coronary artery without angina pectoris: Secondary | ICD-10-CM | POA: Diagnosis not present

## 2019-11-16 DIAGNOSIS — I340 Nonrheumatic mitral (valve) insufficiency: Secondary | ICD-10-CM

## 2019-11-16 LAB — CBC
Hematocrit: 24.3 % — ABNORMAL LOW (ref 34.0–46.6)
Hemoglobin: 7.8 g/dL — ABNORMAL LOW (ref 11.1–15.9)
MCH: 28.1 pg (ref 26.6–33.0)
MCHC: 32.1 g/dL (ref 31.5–35.7)
MCV: 87 fL (ref 79–97)
Platelets: 353 10*3/uL (ref 150–450)
RBC: 2.78 x10E6/uL — ABNORMAL LOW (ref 3.77–5.28)
RDW: 14.1 % (ref 11.7–15.4)
WBC: 11.4 10*3/uL — ABNORMAL HIGH (ref 3.4–10.8)

## 2019-11-16 LAB — HEPATIC FUNCTION PANEL
ALT: 9 IU/L (ref 0–32)
AST: 11 IU/L (ref 0–40)
Albumin: 3.4 g/dL — ABNORMAL LOW (ref 3.6–4.6)
Alkaline Phosphatase: 73 IU/L (ref 44–121)
Bilirubin Total: 0.3 mg/dL (ref 0.0–1.2)
Bilirubin, Direct: 0.19 mg/dL (ref 0.00–0.40)
Total Protein: 6.2 g/dL (ref 6.0–8.5)

## 2019-11-16 LAB — BASIC METABOLIC PANEL
BUN/Creatinine Ratio: 27 (ref 12–28)
BUN: 80 mg/dL (ref 8–27)
CO2: 26 mmol/L (ref 20–29)
Calcium: 9.2 mg/dL (ref 8.7–10.3)
Chloride: 95 mmol/L — ABNORMAL LOW (ref 96–106)
Creatinine, Ser: 2.97 mg/dL — ABNORMAL HIGH (ref 0.57–1.00)
GFR calc Af Amer: 16 mL/min/{1.73_m2} — ABNORMAL LOW (ref >59)
GFR calc non Af Amer: 14 mL/min/{1.73_m2} — ABNORMAL LOW (ref >59)
Glucose: 310 mg/dL — ABNORMAL HIGH (ref 65–99)
Potassium: 3.6 mmol/L (ref 3.5–5.2)
Sodium: 138 mmol/L (ref 134–144)

## 2019-11-16 LAB — MAGNESIUM: Magnesium: 2.3 mg/dL (ref 1.6–2.3)

## 2019-11-16 LAB — TSH: TSH: 16 u[IU]/mL — ABNORMAL HIGH (ref 0.450–4.500)

## 2019-11-16 NOTE — Telephone Encounter (Signed)
I believe this may have been intended for [name] or Dr. Lovena Le?

## 2019-11-16 NOTE — Patient Instructions (Addendum)
After Visit Summary:  We will be checking the following labs today - BMET, CBC, HPF, Mag and TSH   Medication Instructions:    Continue with your current medicines.    If you need a refill on your cardiac medications before your next appointment, please call your pharmacy.     Testing/Procedures To Be Arranged:  N/A  Follow-Up:   See [name] on Monday    At [facility], you and your health needs are our priority.  As part of our continuing mission to provide you with exceptional heart care, we have created designated Provider Care Teams.  These Care Teams include your primary Cardiologist (physician) and Advanced Practice Providers (APPs -  Physician Assistants and Nurse Practitioners) who all work together to provide you with the care you need, when you need it.  Special Instructions:  . Stay safe, wash your hands for at least 20 seconds and wear a mask when needed.  . It was good to talk with you today.    Call the [place] office at [phone] if you have any questions, problems or concerns.

## 2019-11-16 NOTE — Telephone Encounter (Signed)
Patient reports over the past few days she has experienced fatigue and shortness of breath. Patient reports she has felt better today. Reports compliance with medications. Patient saw today in office by Truitt Merle, NP. Patient states she was scheduled an apt. 11/17/19 with AF Clinic. I checked and see it was canceled. Could I clarify for patient, she will need to know to come in or not?  Patient has follow-up with [name] 11/21/19.   Routing to Dr. Sallyanne Kuster for recommendations.

## 2019-11-17 ENCOUNTER — Ambulatory Visit (HOSPITAL_COMMUNITY): Payer: Medicare Other | Admitting: Nurse Practitioner

## 2019-11-17 ENCOUNTER — Telehealth: Payer: Self-pay | Admitting: *Deleted

## 2019-11-17 DIAGNOSIS — Z79899 Other long term (current) drug therapy: Secondary | ICD-10-CM

## 2019-11-17 MED ORDER — LEVOTHYROXINE SODIUM 175 MCG PO TABS: 175.0000 ug | ORAL_TABLET | Freq: Every day | ORAL | 1 refills | Status: DC

## 2019-11-17 MED ORDER — TORSEMIDE 20 MG PO TABS: 40.0000 mg | ORAL_TABLET | Freq: Two times a day (BID) | ORAL | 3 refills | Status: DC

## 2019-11-17 NOTE — Telephone Encounter (Signed)
Spoke to Lauren Walker regarding AF Clinic apt. Canceled. [name], [name] looked at patients chart and since patient was seen by [name] and has follow-up with [name] on Monday he felt it was ok for the patient to not be seen in AF Clinic today.   Called and informed patient, patient agreeable to plan. Patient advised if she has ay changes to please call us. Verbalized understanding.

## 2019-11-17 NOTE — Telephone Encounter (Signed)
-----   Message from Burtis Junes, NP sent at 11/17/2019  7:11 AM EDT ----- Routing to [name] for any other recommendations.  Has worsening kidney function - would cut the Torsemide back to just 40 mg a day until seen back on Monday. Does she follow with Nephrologist?? Thyroid level is abnormal - would increase her Synthroid to 175 mcg a day  Worsening anemia as well  Copy of my labs and note to PCP Will need repeat BMET and CBC on Monday

## 2019-11-18 ENCOUNTER — Other Ambulatory Visit: Payer: Self-pay

## 2019-11-18 ENCOUNTER — Ambulatory Visit: Payer: Medicare Other | Admitting: Cardiology

## 2019-11-18 ENCOUNTER — Encounter: Payer: Self-pay | Admitting: Family Medicine

## 2019-11-18 ENCOUNTER — Telehealth (INDEPENDENT_AMBULATORY_CARE_PROVIDER_SITE_OTHER): Payer: Medicare Other | Admitting: Family Medicine

## 2019-11-18 ENCOUNTER — Other Ambulatory Visit: Payer: Self-pay | Admitting: *Deleted

## 2019-11-18 DIAGNOSIS — I2510 Atherosclerotic heart disease of native coronary artery without angina pectoris: Secondary | ICD-10-CM | POA: Diagnosis not present

## 2019-11-18 DIAGNOSIS — F419 Anxiety disorder, unspecified: Secondary | ICD-10-CM | POA: Diagnosis not present

## 2019-11-18 DIAGNOSIS — L299 Pruritus, unspecified: Secondary | ICD-10-CM

## 2019-11-18 MED ORDER — LORAZEPAM 1 MG PO TABS: 1.0000 mg | ORAL_TABLET | Freq: Two times a day (BID) | ORAL | 1 refills | Status: AC

## 2019-11-18 MED ORDER — TRIAMCINOLONE ACETONIDE 0.1 % EX OINT: 1.0000 "application " | TOPICAL_OINTMENT | Freq: Two times a day (BID) | CUTANEOUS | 1 refills | Status: DC

## 2019-11-18 MED ORDER — TORSEMIDE 20 MG PO TABS: 40.0000 mg | ORAL_TABLET | Freq: Every day | ORAL | 3 refills | Status: DC

## 2019-11-18 NOTE — Progress Notes (Signed)
I have discussed the procedure for the virtual visit with the patient who has given consent to proceed with assessment and treatment.   Pt unable to obtain vitals.   Lauren Walker L Kinsie Belford, CMA     

## 2019-11-18 NOTE — Progress Notes (Signed)
Virtual Visit via Video   I connected with patient on 11/18/19 at  2:00 PM EDT by a video enabled telemedicine application and verified that I am speaking with the correct person using two identifiers.  Location patient: Home Location provider: [facility], Office Persons participating in the virtual visit: Patient, Provider, [place] (Jess B)  I discussed the limitations of evaluation and management by telemedicine and the availability of in person appointments. The patient expressed understanding and agreed to proceed.  Subjective:   HPI:   Anxiety- pt recently hospitalized again due to Afib, SOB.  She has severe mitral regurgitation and they are considering a mitral valve clip.  Pt is open to this idea.  Pt is under considerable stress with her health issues and multiple hospitalizations.  Pt was given lorazepam at last d/c.  She feels it calms things 'a little' but not much difference.  She has been taking this twice daily but still feels anxious.    'I itch'- upper torso, across shoulders.  She feels she is itching in response to adhesive used in the hospital  ROS:   See pertinent positives and negatives per HPI.  Patient Active Problem List   Diagnosis Date Noted  . Demand ischemia ([place]) 11/10/2019  . Pericardial effusion 11/10/2019  . Hyperlipidemia 11/10/2019  . Normocytic anemia 11/10/2019  . Anxiety 11/10/2019  . Atrial fibrillation with RVR ([place]) 10/22/2019  . Atrial fibrillation with rapid ventricular response ([place])   . Chest pain of uncertain etiology 92/42/6834  . Persistent atrial fibrillation ([place]) 10/21/2019  . Arthritis of left hip 09/19/2019  . ICD (implantable cardioverter-defibrillator) in place 07/06/2019  . Pleural effusion on left 06/29/2019  . Pacemaker 06/29/2019  . History of VT felt to be secondary to Amiodarone and Prolonged QT s/p ICD 06/17/2019  . Sinus bradycardia 06/17/2019  . Transaminitis 06/17/2019  . Acute diastolic CHF (congestive  heart failure) ([place]) 05/25/2019  . Acute kidney injury superimposed on chronic kidney disease ([place]) 05/17/2019  . Anemia associated with stage 4 chronic renal failure ([place]) 05/17/2019  . Malignant neoplasm of upper-outer quadrant of left breast in female, estrogen receptor positive ([place]) 03/02/2019  . Aortic stenosis 02/14/2019  . Severe mitral regurgitation 11/04/2018  . Paroxysmal atrial fibrillation ([place]) 10/11/2018  . Hyperlipidemia associated with type 2 diabetes mellitus ([place]) 08/16/2018  . Type 2 diabetes mellitus with other circulatory complications ([place]) 19/62/2297  . Hypothyroid 08/16/2018  . HTN (hypertension) 08/16/2018  . CAD (coronary artery disease) 08/16/2018  . History of MI (myocardial infarction) 08/16/2018    Social History   Tobacco Use  . Smoking status: Former Research scientist (life sciences)  . Smokeless tobacco: Never Used  Substance Use Topics  . Alcohol use: Yes    Comment: Very rare    Current Outpatient Medications:  .  acetaminophen (TYLENOL) 650 MG CR tablet, Take 1,300 mg by mouth every 8 (eight) hours as needed for pain. , Disp: , Rfl:  .  amiodarone (PACERONE) 200 MG tablet, Take 1 tablet (200 mg total) by mouth 2 (two) times daily., Disp: 60 tablet, Rfl: 2 .  anastrozole (ARIMIDEX) 1 MG tablet, TAKE 1 TABLET BY MOUTH EVERY DAY, Disp: 90 tablet, Rfl: 0 .  apixaban (ELIQUIS) 2.5 MG TABS tablet, Take 1 tablet (2.5 mg total) by mouth 2 (two) times daily., Disp: 180 tablet, Rfl: 3 .  atorvastatin (LIPITOR) 40 MG tablet, TAKE 1 TABLET BY MOUTH EVERY DAY, Disp: 90 tablet, Rfl: 1 .  b complex vitamins capsule, Take 1 capsule  by mouth daily., Disp: , Rfl:  .  Biotin 1 MG CAPS, Take 1 mg by mouth daily. , Disp: , Rfl:  .  Cholecalciferol (VITAMIN D3) 50 MCG (2000 UT) TABS, Take 2,000 Units by mouth at bedtime. , Disp: , Rfl:  .  diltiazem (CARDIZEM CD) 300 MG 24 hr capsule, Take 1 capsule (300 mg total) by mouth daily., Disp: 30 capsule, Rfl: 2 .  Ferrous Sulfate (IRON) 325 (65 Fe)  MG TABS, Take 325 mg by mouth daily with breakfast. , Disp: , Rfl:  .  fluticasone (FLONASE) 50 MCG/ACT nasal spray, Place 1 spray into both nostrils in the morning and at bedtime., Disp: , Rfl:  .  isosorbide dinitrate (ISORDIL) 20 MG tablet, TAKE 2 TABLETS (40MG ) BY MOUTH TWICE DAILY, Disp: 120 tablet, Rfl: 1 .  levothyroxine (SYNTHROID) 175 MCG tablet, Take 1 tablet (175 mcg total) by mouth daily before breakfast., Disp: 90 tablet, Rfl: 1 .  LORazepam (ATIVAN) 0.5 MG tablet, Take 1 tablet (0.5 mg total) by mouth 2 (two) times daily as needed for anxiety., Disp: 30 tablet, Rfl: 0 .  magnesium oxide (MAG-OX) 400 MG tablet, Take 400 mg by mouth at bedtime. , Disp: , Rfl:  .  nitroGLYCERIN (NITROSTAT) 0.4 MG SL tablet, Place 1 tablet (0.4 mg total) under the tongue every 5 (five) minutes as needed for chest pain., Disp: 30 tablet, Rfl: 1 .  torsemide (DEMADEX) 20 MG tablet, Take 2 tablets (40 mg total) by mouth daily., Disp: 180 tablet, Rfl: 3 .  vitamin C (ASCORBIC ACID) 500 MG tablet, Take 500 mg by mouth daily., Disp: , Rfl:   Allergies  Allergen Reactions  . Amiodarone Other (See Comments)    Prolonged QT, VT  . Aranesp (Albumin Free) [Darbepoetin Alfa] Other (See Comments)    Patient had cardiac arrest evening after receiving this  . Tape Itching, Rash and Other (See Comments)    Reaction was from the surgical tape from cyst removed  . Latex Itching    Objective:   There were no vitals taken for this visit. AAOx3, NAD NCAT, EOMI No obvious CN deficits Coloring WNL Pt is able to speak clearly, coherently without shortness of breath or increased work of breathing.  Thought process is linear.  Mood is appropriate.   Assessment and Plan:   Anxiety- new.  Understandable given all that pt is going through over the last few months.  Her health is in a precarious position and these repeat hospitalizations are very difficult for her.  Lorazepam 0.5mg  twice daily has only been mildly  effective.  Will increase to 1mg  BID and monitor closely for improvement.  Did discuss the possibility of sedation and increased risk of falls and confusion.  Family to monitor closely.  Itching- new.  Likely due to the EKG leads used in the hospital.  Start Triamcinolone twice daily to improve itching.  Pt expressed understanding and is in agreement w/ plan.    Annye Asa, MD 11/18/2019

## 2019-11-21 ENCOUNTER — Ambulatory Visit (HOSPITAL_BASED_OUTPATIENT_CLINIC_OR_DEPARTMENT_OTHER): Payer: Medicare Other

## 2019-11-21 ENCOUNTER — Other Ambulatory Visit: Payer: Self-pay

## 2019-11-21 ENCOUNTER — Encounter: Payer: Medicare Other | Admitting: Thoracic Surgery (Cardiothoracic Vascular Surgery)

## 2019-11-21 ENCOUNTER — Ambulatory Visit (INDEPENDENT_AMBULATORY_CARE_PROVIDER_SITE_OTHER): Payer: Medicare Other | Admitting: Cardiovascular Disease

## 2019-11-21 ENCOUNTER — Encounter: Payer: Self-pay | Admitting: Cardiovascular Disease

## 2019-11-21 VITALS — BP 120/70 | HR 96 | Ht 64.0 in | Wt 175.8 lb

## 2019-11-21 DIAGNOSIS — I313 Pericardial effusion (noninflammatory): Secondary | ICD-10-CM

## 2019-11-21 DIAGNOSIS — I340 Nonrheumatic mitral (valve) insufficiency: Secondary | ICD-10-CM

## 2019-11-21 DIAGNOSIS — I3139 Other pericardial effusion (noninflammatory): Secondary | ICD-10-CM

## 2019-11-21 DIAGNOSIS — I5043 Acute on chronic combined systolic (congestive) and diastolic (congestive) heart failure: Secondary | ICD-10-CM

## 2019-11-21 DIAGNOSIS — I3131 Malignant pericardial effusion in diseases classified elsewhere: Secondary | ICD-10-CM

## 2019-11-21 DIAGNOSIS — I130 Hypertensive heart and chronic kidney disease with heart failure and stage 1 through stage 4 chronic kidney disease, or unspecified chronic kidney disease: Secondary | ICD-10-CM | POA: Diagnosis not present

## 2019-11-21 DIAGNOSIS — R570 Cardiogenic shock: Secondary | ICD-10-CM | POA: Diagnosis not present

## 2019-11-21 DIAGNOSIS — C801 Malignant (primary) neoplasm, unspecified: Secondary | ICD-10-CM | POA: Diagnosis not present

## 2019-11-21 DIAGNOSIS — I2510 Atherosclerotic heart disease of native coronary artery without angina pectoris: Secondary | ICD-10-CM | POA: Diagnosis not present

## 2019-11-21 DIAGNOSIS — I4819 Other persistent atrial fibrillation: Secondary | ICD-10-CM

## 2019-11-21 LAB — ECHOCARDIOGRAM LIMITED
AR max vel: 1.37 cm2
AV Area VTI: 1.38 cm2
AV Area mean vel: 1.52 cm2
AV Mean grad: 9.8 mmHg
AV Peak grad: 18.4 mmHg
Ao pk vel: 2.15 m/s
MV M vel: 5.84 m/s
MV Peak grad: 136.3 mmHg
P 1/2 time: 280 msec
Radius: 0.5 cm
S' Lateral: 3.5 cm

## 2019-11-21 MED ORDER — METOPROLOL SUCCINATE ER 50 MG PO TB24: 50.0000 mg | ORAL_TABLET | Freq: Every day | ORAL | 3 refills | Status: DC

## 2019-11-21 MED ORDER — TORSEMIDE 20 MG PO TABS
60.0000 mg | ORAL_TABLET | Freq: Every day | ORAL | 3 refills | Status: DC
Start: 2019-11-21 — End: 2019-11-29

## 2019-11-21 MED ORDER — POTASSIUM CHLORIDE CRYS ER 20 MEQ PO TBCR: 20.0000 meq | EXTENDED_RELEASE_TABLET | Freq: Every day | ORAL | 3 refills | Status: DC

## 2019-11-21 NOTE — Patient Instructions (Addendum)
Medication Instructions:  1) STOP DILTIAZEM 2) INCREASE TORSEMIDE to 40 mg twice daily for two days then 60 mg daily after that  3) START POTASSIUM 20 meq daily 4) START TOPROL (metoprolol succinate) 50 mg daily  *If you need a refill on your cardiac medications before your next appointment, please call your pharmacy*  Follow-Up: You are scheduled with [name] on 10/25 at 1:40PM.

## 2019-11-21 NOTE — Progress Notes (Signed)
Cardiology Office Note:    Date:  11/21/2019   ID:  Lauren Walker, DOB 1929-09-10, MRN [id number]  PCP:  Midge Minium, MD  [facility] Cardiologist:  Jenne Campus, MD  [place] Electrophysiologist:  None   Referring MD: Midge Minium, MD   Chief Complaint  Patient presents with  . Shortness of Breath    History of Present Illness:    Lauren Walker is a 86 y.o. female presenting for follow-up of severe mitral regurgitation.  The patient has a history of coronary artery disease with late presenting inferior MI in 2016.  Cardiac catheterization at that time demonstrated occlusion of the right coronary artery with collateralization from the left.  She was noted to have nonobstructive disease in the LAD and left circumflex.  She has developed worsening mitral regurgitation over time and recently has been in and out of the hospital with atrial fibrillation with RVR, acute on chronic combined systolic and diastolic heart failure, and ventricular tachycardia.  She has also developed stage IV chronic kidney disease which has progressed recently.  The patient has undergone ICD implantation.  She was most recently hospitalized September 16 with progressive dyspnea and palpitations, again found to be in atrial fibrillation with RVR.  She has subsequently been loaded with IV amiodarone and again cardioverted.  She only stayed in sinus rhythm for less than 24 hours and went back into atrial fibrillation while she was still hospitalized.  She was followed closely by the electrophysiology team.  They recommended rate control at that point.  During her hospitalization, an echocardiogram demonstrated a moderate pericardial effusion.  She presents today for clinical follow-up as well as reassessment of her pericardial effusion with an echocardiogram.  The patient is here with her husband today.  Since discharge from the hospital she has really struggled.  She is in a wheelchair today and  she really is not able to walk at this time.  She has had progressive weakness and shortness of breath with just about any activity.  She is sleeping upright in a chair or a recliner.  She is not able to lie flat in bed or even propped up on pillows.  She has gained weight and has some increasing leg swelling.  She denies chest pain or pressure.  She denies lightheadedness or syncope.  She has to be helped to the bathroom, but she is able to transfer from her wheelchair to commode.  She is taking her medications as prescribed.  Past Medical History:  Diagnosis Date  . Acute CHF (congestive heart failure) ([place]) 05/25/2019  . Anemia associated with stage 4 chronic renal failure ([place]) 05/17/2019  . Aortic stenosis 02/14/2019  . Arthritis   . CAD (coronary artery disease) 08/16/2018  . Chronic kidney disease   . Chronic systolic dysfunction of left ventricle   . CKD stage G4/A1, GFR 15-29 and albumin creatinine ratio <30 mg/g (H[facility]) 05/17/2019  . Coronary artery disease   . Diabetes mellitus without complication ([place])   . Diverticulitis   . History of MI (myocardial infarction) 08/16/2018  . HTN (hypertension) 08/16/2018  . Hyperlipidemia associated with type 2 diabetes mellitus ([place]) 08/16/2018  . Hypertension   . Hypothyroid 08/16/2018  . Hypothyroidism   . ICD (implantable cardioverter-defibrillator) in place 07/06/2019  . Malignant neoplasm of upper-outer quadrant of left breast in female, estrogen receptor positive ([place]) 03/02/2019  . Mitral regurgitation moderate to severe based on echocardiogram in summer 2020 11/04/2018  . Myocardial infarction ([name])   .  Pacemaker 06/29/2019  . Paroxysmal atrial fibrillation (H[facility])   . Pleural effusion on left 06/29/2019  . Sinus bradycardia 06/17/2019  . Thyroid disease   . Transaminitis 06/17/2019  . Type 2 diabetes mellitus with other circulatory complications ([place]) 7/74/1423  . V-tach [facility]) 06/17/2019    Past Surgical History:  Procedure Laterality Date  .  APPENDECTOMY  1936  . BUBBLE STUDY  11/02/2019   Procedure: BUBBLE STUDY;  Surgeon: [name], MD;  Location: [place];  Service: Cardiology;;  . CARDIOVERSION N/A 11/07/2019   Procedure: CARDIOVERSION;  Surgeon: Dorothy Spark, MD;  Location: [place];  Service: Cardiovascular;  Laterality: N/A;  . ICD IMPLANT N/A 06/20/2019   Procedure: ICD IMPLANT;  Surgeon: Evans Lance, MD;  Location: [place] CV LAB;  Service: Cardiovascular;  Laterality: N/A;  . IR THORACENTESIS ASP PLEURAL SPACE W/IMG GUIDE  05/27/2019  . IR THORACENTESIS ASP PLEURAL SPACE W/IMG GUIDE  11/08/2019  . PARS PLANA VITRECTOMY Right 12/04/2018   Procedure: RETINAL DETACHMENT REPAIR PPV 25 GAUGE WITH ENDO LASER AIR/FLUID EXCHANGE SF6 GAS INJECTION;  Surgeon: Jalene Mullet, MD;  Location: [place];  Service: Ophthalmology;  Laterality: Right;  . TEE WITHOUT CARDIOVERSION N/A 11/02/2019   Procedure: TRANSESOPHAGEAL ECHOCARDIOGRAM (TEE);  Surgeon: [name], MD;  Location: [place];  Service: Cardiology;  Laterality: N/A;  . TONSILLECTOMY      Current Medications: Current Meds  Medication Sig  . acetaminophen (TYLENOL) 650 MG CR tablet Take 1,300 mg by mouth every 8 (eight) hours as needed for pain.   Marland Kitchen amiodarone (PACERONE) 200 MG tablet Take 1 tablet (200 mg total) by mouth 2 (two) times daily.  Marland Kitchen anastrozole (ARIMIDEX) 1 MG tablet TAKE 1 TABLET BY MOUTH EVERY DAY  . apixaban (ELIQUIS) 2.5 MG TABS tablet Take 1 tablet (2.5 mg total) by mouth 2 (two) times daily.  Marland Kitchen atorvastatin (LIPITOR) 40 MG tablet TAKE 1 TABLET BY MOUTH EVERY DAY  . b complex vitamins capsule Take 1 capsule by mouth daily.  . Biotin 1 MG CAPS Take 1 mg by mouth daily.   . Cholecalciferol (VITAMIN D3) 50 MCG (2000 UT) TABS Take 2,000 Units by mouth at bedtime.   . Ferrous Sulfate (IRON) 325 (65 Fe) MG TABS Take 325 mg by mouth daily with breakfast.   . fluticasone (FLONASE) 50 MCG/ACT nasal spray Place 1 spray into both  nostrils in the morning and at bedtime.  . isosorbide dinitrate (ISORDIL) 20 MG tablet TAKE 2 TABLETS (40MG ) BY MOUTH TWICE DAILY  . levothyroxine (SYNTHROID) 175 MCG tablet Take 1 tablet (175 mcg total) by mouth daily before breakfast.  . LORazepam (ATIVAN) 1 MG tablet Take 1 tablet (1 mg total) by mouth 2 (two) times daily.  . magnesium oxide (MAG-OX) 400 MG tablet Take 400 mg by mouth at bedtime.   . nitroGLYCERIN (NITROSTAT) 0.4 MG SL tablet Place 1 tablet (0.4 mg total) under the tongue every 5 (five) minutes as needed for chest pain.  Marland Kitchen torsemide (DEMADEX) 20 MG tablet Take 3 tablets (60 mg total) by mouth daily.  Marland Kitchen triamcinolone ointment (KENALOG) 0.1 % Apply 1 application topically 2 (two) times daily.  . vitamin C (ASCORBIC ACID) 500 MG tablet Take 500 mg by mouth daily.  . [DISCONTINUED] diltiazem (CARDIZEM CD) 300 MG 24 hr capsule Take 1 capsule (300 mg total) by mouth daily.  . [DISCONTINUED] torsemide (DEMADEX) 20 MG tablet Take 2 tablets (40 mg total) by mouth daily.  Allergies:   Amiodarone, Aranesp (albumin free) [darbepoetin alfa], Tape, and Latex   Social History   Socioeconomic History  . Marital status: Married    Spouse name: Not on file  . Number of children: Not on file  . Years of education: Not on file  . Highest education level: Not on file  Occupational History  . Not on file  Tobacco Use  . Smoking status: Former Research scientist (life sciences)  . Smokeless tobacco: Never Used  Vaping Use  . Vaping Use: Never used  Substance and Sexual Activity  . Alcohol use: Yes    Comment: Very rare  . Drug use: Not Currently  . Sexual activity: Not Currently  Other Topics Concern  . Not on file  Social History Narrative  . Not on file   Social Determinants of Health   Financial Resource Strain:   . Difficulty of Paying Living Expenses: Not on file  Food Insecurity:   . Worried About Charity fundraiser in the Last Year: Not on file  . Ran Out of Food in the Last Year: Not on  file  Transportation Needs:   . Lack of Transportation (Medical): Not on file  . Lack of Transportation (Non-Medical): Not on file  Physical Activity:   . Days of Exercise per Week: Not on file  . Minutes of Exercise per Session: Not on file  Stress:   . Feeling of Stress : Not on file  Social Connections:   . Frequency of Communication with Friends and Family: Not on file  . Frequency of Social Gatherings with Friends and Family: Not on file  . Attends Religious Services: Not on file  . Active Member of Clubs or Organizations: Not on file  . Attends Archivist Meetings: Not on file  . Marital Status: Not on file     Family History: The patient's family history includes Arthritis in her daughter, father, mother, and son; Depression in her maternal aunt; Heart attack in her father; Heart disease in her father and mother; Hyperlipidemia in her maternal aunt; Hypertension in her maternal aunt and mother.  ROS:   Please see the history of present illness.    Positive for fatigue, weakness.  All other systems reviewed and are negative.  EKGs/Labs/Other Studies Reviewed:    The following studies were reviewed today: TEE: 1. Left ventricular ejection fraction, by estimation, is 50%. The left  ventricle has low normal function.  2. Right ventricular systolic function is mildly reduced. The right  ventricular size is normal.  3. Left atrial size was severely dilated. No left atrial/left atrial  appendage thrombus was detected. The LAA emptying velocity was 37 cm/s.  4. Right atrial size was severely dilated.  5. Moderate pericardial effusion.  6. Prolapse of A2 scallop with severe, eccentric mitral valve  regurgitation. Multiple jets. Dominant jet arises at A2 P2 scallop.  Dominant jet ERO 0.18, RV 24 mL.   Transcatheter Edge to Edge repair measurements:   PMVL length 9 mm   Flail gap 5 mm   Flail width 5 mm   Coaptation depth 8 mm   Coaptation  length 3.4 mm   No significant leaflet calcifications. The mitral valve is  degenerative. Findings suggest Carpentier type II primary mitral valve  regurgitation.  7. Tricuspid valve regurgitation is mild to moderate.  8. The aortic valve is calcified. There is moderate calcification of the  aortic valve. Aortic valve regurgitation is trivial.  9. There is Moderate (Grade III) atheroma  plaque involving the transverse  and descending aorta.  10. Agitated saline contrast bubble study was negative, with no evidence  of any interatrial shunt.   2d Echo 11-21-19: 1. Mild global hypokinesis slightly worse in the septum. Left ventricular  ejection fraction, by estimation, is 45 to 50%. The left ventricle has  mildly decreased function. The left ventricle demonstrates global  hypokinesis.  2. Right ventricular systolic function is normal. The right ventricular  size is normal. There is mildly elevated pulmonary artery systolic  pressure.  3. Left atrial size was severely dilated.  4. A small pericardial effusion is present. The pericardial effusion is  posterior to the left ventricle.  5. Severe mitral regurgitation with Coanda effect and pulmonary vein flow  reversal. The mitral valve is normal in structure. Severe mitral valve  regurgitation. No evidence of mitral stenosis.  6. Tricuspid valve regurgitation is moderate.  7. Aortic valve leaflets are restricted. . The aortic valve is normal in  structure. There is moderate calcification of the aortic valve. There is  moderate thickening of the aortic valve. Aortic valve regurgitation is  mild. Mild aortic valve stenosis.  Aortic valve area, by VTI measures 1.38 cm. Aortic valve mean gradient  measures 9.8 mmHg. Aortic valve Vmax measures 2.14 m/s.  8. Aortic dilatation noted. There is mild dilatation of the ascending  aorta, measuring 38 mm.  9. The inferior vena cava is normal in size with greater than 50%  respiratory  variability, suggesting right atrial pressure of 3 mmHg.   Comparison(s): TEE 11/03/19 EF 50%. Severe MR. Moderate pericardial  effusion. TTE 10/22/19 EF 45-50%. Severe MR. Small pericardial effusion.   EKG:  EKG is not ordered today.   Recent Labs: 09/02/2019: NT-Pro BNP 12,348 11/03/2019: B Natriuretic Peptide 956.3 11/16/2019: ALT 9; BUN 80; Creatinine, Ser 2.97; Hemoglobin 7.8; Magnesium 2.3; Platelets 353; Potassium 3.6; Sodium 138; TSH 16.000  Recent Lipid Panel    Component Value Date/Time   CHOL 109 12/29/2018 1415   TRIG 154.0 (H) 12/29/2018 1415   HDL 48.40 12/29/2018 1415   CHOLHDL 2 12/29/2018 1415   VLDL 30.8 12/29/2018 1415   LDLCALC 30 12/29/2018 1415    Physical Exam:    VS:  BP 120/70   Pulse 96   Ht 5\' 4"  (1.626 m)   Wt 175 lb 12.8 oz (79.7 kg)   SpO2 97%   BMI 30.18 kg/m     Wt Readings from Last 3 Encounters:  11/21/19 175 lb 12.8 oz (79.7 kg)  11/16/19 170 lb 9.6 oz (77.4 kg)  11/10/19 169 lb 11.2 oz (77 kg)     GEN: Elderly woman, no acute distress, in a wheelchair today, chronically ill-appearing HEENT: Normal NECK: JVD elevated; No carotid bruits LYMPHATICS: No lymphadenopathy CARDIAC: Irregularly irregular, 3/6 holosystolic murmur at the apex, 2/6 harsh crescendo decrescendo murmur at the right upper sternal border RESPIRATORY:  Clear to auscultation without rales, wheezing or rhonchi  ABDOMEN: Soft, non-tender, non-distended MUSCULOSKELETAL: 2+ bilateral pretibial and pedal edema; No deformity  SKIN: Warm and dry NEUROLOGIC:  Alert and oriented x 3 PSYCHIATRIC:  Normal affect   ASSESSMENT:    1. Severe mitral regurgitation   2. Persistent atrial fibrillation ([place])   3. Acute on chronic combined systolic and diastolic CHF (congestive heart failure) ([place])   4. Pericardial effusion    PLAN:    In order of problems listed above:  1. The patient has severe ischemic mitral regurgitation.  I reviewed her echo performed this  afternoon which  demonstrates again severe MR in the setting of moderate LV dysfunction.  She has undergone extensive evaluation with structural heart consultation, formal cardiac surgical consultation, and transesophageal echo.  She is not a candidate for cardiac catheterization because of worsening chronic kidney disease, now essentially stage IV with a creatinine of 3 mg/dL and GFR approximately 15.  By the patient's functional anatomy is amenable to transcatheter edge-to-edge mitral valve repair, I think her progressive decline and advanced age places her at very high risk of a bad outcome with any procedure under general anesthesia.  She again appears to have decompensated heart failure and progressive physical debility.  I will try to adjust her medications and see her back in close follow-up.  She has been hospitalized with atrial fibrillation with RVR and heart failure twice in the month of September and I think has become increasingly weak related to her hospitalizations and illnesses.  If she does not improve with medication adjustments, I think it is probably best to approach her care in a more conservative/palliative fashion.  I spoke to the patient and her husband about this today at length. 2. She has failed cardioversion both with and without amiodarone.  She will be treated medically with rate control.  She continues on amiodarone for rate control.  I am going to change her diltiazem to metoprolol succinate 50 mg daily.  She continues on apixaban for anticoagulation. 3. I have asked her to increase torsemide to 40 mg twice daily x2 days, then increase her baseline dose to 60 mg daily.  We will see her back in 3 weeks with a metabolic panel prior to that visit we will supplement K-Dur 20 mEq daily.  Otherwise continue current therapy. 4. The patient's echo images from today's study are reviewed.  Her pericardial effusion is smaller than her previous study.  Continue observation.  Overall the patient has declined  significantly.  I do not think she has a candidate for transcatheter mitral valve repair in her present state.  I am not sure that she will have the reserve to improve and gain strength after her recent hospitalizations, but I will see her back in 3 weeks to assess the efficacy of medication changes outlined above.   Medication Adjustments/Labs and Tests Ordered: Current medicines are reviewed at length with the patient today.  Concerns regarding medicines are outlined above.  No orders of the defined types were placed in this encounter.  Meds ordered this encounter  Medications  . torsemide (DEMADEX) 20 MG tablet    Sig: Take 3 tablets (60 mg total) by mouth daily.    Dispense:  270 tablet    Refill:  3  . potassium chloride SA (KLOR-CON) 20 MEQ tablet    Sig: Take 1 tablet (20 mEq total) by mouth daily.    Dispense:  90 tablet    Refill:  3  . metoprolol succinate (TOPROL-XL) 50 MG 24 hr tablet    Sig: Take 1 tablet (50 mg total) by mouth daily. Take with or immediately following a meal.    Dispense:  90 tablet    Refill:  3    Patient Instructions  Medication Instructions:  1) STOP DILTIAZEM 2) INCREASE TORSEMIDE to 40 mg twice daily for two days then 60 mg daily after that  3) START POTASSIUM 20 meq daily 4) START TOPROL (metoprolol succinate) 50 mg daily  *If you need a refill on your cardiac medications before your next appointment, please call your pharmacy*  Follow-Up: You are scheduled with [name] on 10/25 at 1:40PM.    Signed, Sherren Mocha, MD  11/21/2019 4:44 PM    [place]

## 2019-11-22 ENCOUNTER — Inpatient Hospital Stay (HOSPITAL_COMMUNITY)
Admission: EM | Admit: 2019-11-22 | Discharge: 2019-11-29 | DRG: 291 | Disposition: A | Discharge status: Hospice | Payer: Medicare Other | Attending: Cardiovascular Disease | Admitting: Cardiovascular Disease

## 2019-11-22 ENCOUNTER — Emergency Department (HOSPITAL_COMMUNITY): Payer: Medicare Other

## 2019-11-22 ENCOUNTER — Telehealth: Payer: Self-pay | Admitting: Cardiovascular Disease

## 2019-11-22 DIAGNOSIS — J9601 Acute respiratory failure with hypoxia: Secondary | ICD-10-CM | POA: Diagnosis present

## 2019-11-22 DIAGNOSIS — Z515 Encounter for palliative care: Secondary | ICD-10-CM

## 2019-11-22 DIAGNOSIS — Z888 Allergy status to other drugs, medicaments and biological substances status: Secondary | ICD-10-CM

## 2019-11-22 DIAGNOSIS — Z91048 Other nonmedicinal substance allergy status: Secondary | ICD-10-CM | POA: Diagnosis not present

## 2019-11-22 DIAGNOSIS — E1169 Type 2 diabetes mellitus with other specified complication: Secondary | ICD-10-CM | POA: Diagnosis present

## 2019-11-22 DIAGNOSIS — Z7901 Long term (current) use of anticoagulants: Secondary | ICD-10-CM

## 2019-11-22 DIAGNOSIS — I255 Ischemic cardiomyopathy: Secondary | ICD-10-CM | POA: Diagnosis present

## 2019-11-22 DIAGNOSIS — R0600 Dyspnea, unspecified: Secondary | ICD-10-CM

## 2019-11-22 DIAGNOSIS — Z91040 Latex allergy status: Secondary | ICD-10-CM

## 2019-11-22 DIAGNOSIS — F411 Generalized anxiety disorder: Secondary | ICD-10-CM

## 2019-11-22 DIAGNOSIS — Z79890 Hormone replacement therapy (postmenopausal): Secondary | ICD-10-CM | POA: Diagnosis not present

## 2019-11-22 DIAGNOSIS — I2510 Atherosclerotic heart disease of native coronary artery without angina pectoris: Secondary | ICD-10-CM | POA: Diagnosis present

## 2019-11-22 DIAGNOSIS — E1122 Type 2 diabetes mellitus with diabetic chronic kidney disease: Secondary | ICD-10-CM | POA: Diagnosis present

## 2019-11-22 DIAGNOSIS — I5043 Acute on chronic combined systolic (congestive) and diastolic (congestive) heart failure: Secondary | ICD-10-CM | POA: Diagnosis present

## 2019-11-22 DIAGNOSIS — Z993 Dependence on wheelchair: Secondary | ICD-10-CM

## 2019-11-22 DIAGNOSIS — Z20822 Contact with and (suspected) exposure to covid-19: Secondary | ICD-10-CM | POA: Diagnosis present

## 2019-11-22 DIAGNOSIS — I130 Hypertensive heart and chronic kidney disease with heart failure and stage 1 through stage 4 chronic kidney disease, or unspecified chronic kidney disease: Secondary | ICD-10-CM | POA: Diagnosis present

## 2019-11-22 DIAGNOSIS — I509 Heart failure, unspecified: Secondary | ICD-10-CM | POA: Diagnosis not present

## 2019-11-22 DIAGNOSIS — Z87891 Personal history of nicotine dependence: Secondary | ICD-10-CM

## 2019-11-22 DIAGNOSIS — I252 Old myocardial infarction: Secondary | ICD-10-CM

## 2019-11-22 DIAGNOSIS — Z853 Personal history of malignant neoplasm of breast: Secondary | ICD-10-CM

## 2019-11-22 DIAGNOSIS — E1165 Type 2 diabetes mellitus with hyperglycemia: Secondary | ICD-10-CM | POA: Diagnosis present

## 2019-11-22 DIAGNOSIS — N184 Chronic kidney disease, stage 4 (severe): Secondary | ICD-10-CM | POA: Diagnosis present

## 2019-11-22 DIAGNOSIS — Z79899 Other long term (current) drug therapy: Secondary | ICD-10-CM | POA: Diagnosis not present

## 2019-11-22 DIAGNOSIS — R570 Cardiogenic shock: Secondary | ICD-10-CM | POA: Diagnosis present

## 2019-11-22 DIAGNOSIS — I340 Nonrheumatic mitral (valve) insufficiency: Secondary | ICD-10-CM | POA: Diagnosis present

## 2019-11-22 DIAGNOSIS — Z8249 Family history of ischemic heart disease and other diseases of the circulatory system: Secondary | ICD-10-CM

## 2019-11-22 DIAGNOSIS — E785 Hyperlipidemia, unspecified: Secondary | ICD-10-CM | POA: Diagnosis present

## 2019-11-22 DIAGNOSIS — Z66 Do not resuscitate: Secondary | ICD-10-CM

## 2019-11-22 DIAGNOSIS — I5084 End stage heart failure: Secondary | ICD-10-CM | POA: Diagnosis present

## 2019-11-22 DIAGNOSIS — I4819 Other persistent atrial fibrillation: Secondary | ICD-10-CM | POA: Diagnosis present

## 2019-11-22 HISTORY — DX: Cardiogenic shock: R57.0

## 2019-11-22 LAB — I-STAT ARTERIAL BLOOD GAS, ED
Acid-Base Excess: 3 mmol/L — ABNORMAL HIGH (ref 0.0–2.0)
Bicarbonate: 29 mmol/L — ABNORMAL HIGH (ref 20.0–28.0)
Calcium, Ion: 1.18 mmol/L (ref 1.15–1.40)
HCT: 26 % — ABNORMAL LOW (ref 36.0–46.0)
Hemoglobin: 8.8 g/dL — ABNORMAL LOW (ref 12.0–15.0)
O2 Saturation: 97 %
Patient temperature: 97.6
Potassium: 4.9 mmol/L (ref 3.5–5.1)
Sodium: 130 mmol/L — ABNORMAL LOW (ref 135–145)
TCO2: 31 mmol/L (ref 22–32)
pCO2 arterial: 52 mmHg — ABNORMAL HIGH (ref 32.0–48.0)
pH, Arterial: 7.352 (ref 7.350–7.450)
pO2, Arterial: 94 mmHg (ref 83.0–108.0)

## 2019-11-22 LAB — CBC
HCT: 27.6 % — ABNORMAL LOW (ref 36.0–46.0)
Hemoglobin: 8.2 g/dL — ABNORMAL LOW (ref 12.0–15.0)
MCH: 27.6 pg (ref 26.0–34.0)
MCHC: 29.7 g/dL — ABNORMAL LOW (ref 30.0–36.0)
MCV: 92.9 fL (ref 80.0–100.0)
Platelets: 391 10*3/uL (ref 150–400)
RBC: 2.97 MIL/uL — ABNORMAL LOW (ref 3.87–5.11)
RDW: 16.4 % — ABNORMAL HIGH (ref 11.5–15.5)
WBC: 13.9 10*3/uL — ABNORMAL HIGH (ref 4.0–10.5)
nRBC: 0 % (ref 0.0–0.2)

## 2019-11-22 LAB — BASIC METABOLIC PANEL
Anion gap: 14 (ref 5–15)
BUN: 98 mg/dL — ABNORMAL HIGH (ref 8–23)
CO2: 26 mmol/L (ref 22–32)
Calcium: 9 mg/dL (ref 8.9–10.3)
Chloride: 93 mmol/L — ABNORMAL LOW (ref 98–111)
Creatinine, Ser: 3.4 mg/dL — ABNORMAL HIGH (ref 0.44–1.00)
GFR calc non Af Amer: 11 mL/min — ABNORMAL LOW (ref >60)
Glucose, Bld: 412 mg/dL — ABNORMAL HIGH (ref 70–99)
Potassium: 5.1 mmol/L (ref 3.5–5.1)
Sodium: 133 mmol/L — ABNORMAL LOW (ref 135–145)

## 2019-11-22 LAB — LACTIC ACID, PLASMA
Lactic Acid, Venous: 2.6 mmol/L (ref 0.5–1.9)
Lactic Acid, Venous: 1.8 mmol/L (ref 0.5–1.9)

## 2019-11-22 LAB — BRAIN NATRIURETIC PEPTIDE: B Natriuretic Peptide: 1695.3 pg/mL — ABNORMAL HIGH (ref 0.0–100.0)

## 2019-11-22 LAB — PROTIME-INR
INR: 1.9 — ABNORMAL HIGH (ref 0.8–1.2)
Prothrombin Time: 20.8 seconds — ABNORMAL HIGH (ref 11.4–15.2)

## 2019-11-22 LAB — RESPIRATORY PANEL BY RT PCR (FLU A&B, COVID)
Influenza A by PCR: NEGATIVE
Influenza B by PCR: NEGATIVE
SARS Coronavirus 2 by RT PCR: NEGATIVE

## 2019-11-22 LAB — MAGNESIUM: Magnesium: 2.8 mg/dL — ABNORMAL HIGH (ref 1.7–2.4)

## 2019-11-22 MED ORDER — AMIODARONE HCL 200 MG PO TABS
200.0000 mg | ORAL_TABLET | Freq: Two times a day (BID) | ORAL | Status: DC
  Administered 2019-11-23 – 2019-11-29 (×13): 200 mg via ORAL
  Filled 2019-11-22 (×13): qty 1

## 2019-11-22 MED ORDER — ACETAMINOPHEN 325 MG PO TABS: 650.0000 mg | ORAL_TABLET | ORAL | Status: DC | PRN

## 2019-11-22 MED ORDER — SODIUM CHLORIDE 0.9% FLUSH
3.0000 mL | Freq: Two times a day (BID) | INTRAVENOUS | Status: DC
  Administered 2019-11-22 – 2019-11-29 (×11): 3 mL via INTRAVENOUS

## 2019-11-22 MED ORDER — CHLORHEXIDINE GLUCONATE CLOTH 2 % EX PADS
6.0000 | MEDICATED_PAD | Freq: Every day | CUTANEOUS | Status: DC
  Administered 2019-11-23: 6 via TOPICAL

## 2019-11-22 MED ORDER — SODIUM CHLORIDE 0.9% FLUSH: 3.0000 mL | INTRAVENOUS | Status: DC | PRN

## 2019-11-22 MED ORDER — SODIUM CHLORIDE 0.9 % IV SOLN: 250.0000 mL | INTRAVENOUS | Status: DC | PRN

## 2019-11-22 MED ORDER — FUROSEMIDE 10 MG/ML IJ SOLN
20.0000 mg/h | INTRAVENOUS | Status: DC
  Administered 2019-11-22: 10 mg/h via INTRAVENOUS
  Administered 2019-11-23 – 2019-11-24 (×2): 20 mg/h via INTRAVENOUS
  Filled 2019-11-22 (×4): qty 25

## 2019-11-22 MED ORDER — FUROSEMIDE 10 MG/ML IJ SOLN: 40.0000 mg | Freq: Once | INTRAMUSCULAR | Status: DC

## 2019-11-22 MED ORDER — ATORVASTATIN CALCIUM 40 MG PO TABS
40.0000 mg | ORAL_TABLET | Freq: Every day | ORAL | Status: DC
  Administered 2019-11-23: 40 mg via ORAL
  Filled 2019-11-22: qty 1

## 2019-11-22 MED ORDER — ONDANSETRON HCL 4 MG/2ML IJ SOLN: 4.0000 mg | Freq: Four times a day (QID) | INTRAMUSCULAR | Status: DC | PRN

## 2019-11-22 MED ORDER — LEVOTHYROXINE SODIUM 75 MCG PO TABS
175.0000 ug | ORAL_TABLET | Freq: Every day | ORAL | Status: DC
  Administered 2019-11-23 – 2019-11-24 (×2): 175 ug via ORAL
  Filled 2019-11-22 (×2): qty 1

## 2019-11-22 MED ORDER — APIXABAN 2.5 MG PO TABS
2.5000 mg | ORAL_TABLET | Freq: Two times a day (BID) | ORAL | Status: DC
  Administered 2019-11-23 (×2): 2.5 mg via ORAL
  Filled 2019-11-22 (×2): qty 1

## 2019-11-22 MED ORDER — FUROSEMIDE 10 MG/ML IJ SOLN
100.0000 mg | Freq: Once | INTRAVENOUS | Status: AC
  Administered 2019-11-22: 100 mg via INTRAVENOUS
  Filled 2019-11-22: qty 10

## 2019-11-22 NOTE — ED Triage Notes (Addendum)
To ED via [facility] - increasing shortness of breath- d/c'd from [place] on 9/16-- hx CHF -- has had 10# weight gain--  tripodding to breath, unable to speak in more than a couple words. O2 at 3l/M/[place] RA sats 85%.  Received ASA x 324mg , NTG x 1, chest pain 7/10 IV LAC 20g.

## 2019-11-22 NOTE — Progress Notes (Signed)
Transported patient from ED to [place] without event.

## 2019-11-22 NOTE — Telephone Encounter (Signed)
Spoke to pt's daughter this afternoon. Recommended palliative approach to her care. Went to call husband tonight to discuss further but see that patient is in the ER with CHF.

## 2019-11-22 NOTE — Telephone Encounter (Signed)
° ° °  Pt's daughter calling, she would like to ask [name] if he can order palliative care for pt.

## 2019-11-22 NOTE — H&P (Signed)
Cardiology History & Physical    Patient ID: JOYANN SPIDLE MRN: [id number], DOB/AGE: 1929-03-27   Admit date: 11/22/2019  Primary Physician: Midge Minium, MD Primary Cardiologist: Jenne Campus, MD  Patient Profile    86 year old female with severe mitral regurgitation, CAD with CTO RCA, chronic atrial fibrillation, long-QT dependent VT s/p ICD, and pericardial effusion presenting with acute decompensated heart failure.  She was discharged 13 days ago after a complicated admission for decompensated heart failure and atrial fibrillation refractory to cardioversion.  History of Present Illness    86 year old female with severe mitral regurgitation, CAD with CTO RCA, chronic atrial fibrillation, long-QT dependent VT s/p ICD, and pericardial effusion presenting with acute decompensated heart failure.  She was discharged 13 days ago after a complicated admission for decompensated heart failure and atrial fibrillation refractory to cardioversion.  She reports that since discharge from the hospital she has continued to have difficulty and is has been largely wheelchair bound.  She has NHYA IV heart failure symptoms, is unable to lie flat, and has been having worsening swelling and fluid retention.  She denies chest pain.   She had worsening shortness of breath to the point of respiratory distress and tripoding and was placed on continuous NIPPV prior to my arrival.  ED provider discussed with patient and husband, and I discussed with patient regarding overall wishes for care going forward.  I offered intensive diuretic therapy to try to offer her some relief which she would like to pursue at this time.  I warned her that, given progressive renal failure, she might require ultrafiltration to become compensated.  She would like to defer this decision for now.  She did confirm to me that she would not want ACLS including intubation or chest compressions in the event of cardiopulmonary collapse.   I reminded her that she has an active ICD in place, but requested that I not deactivate it at this time.  She was last admitted September 3 to for acute decompensated heart failure presenting as an outpatient atrial fibrillation RVR.  At that admission she underwent cardioversion 3 times in the emergency department was unsuccessful.  She was subsequently started on IV diltiazem infusion continued paroxysms of atrial fibrillation.  She has been taken off of amiodarone prior given some concern for QT prolongation dependent VT but this was resumed during her prior admission.  She then underwent a repeat cardioversion but unfortunately had recurrence of her atrial fibrillation and is in atrial fibrillation today.  She underwent a thoracentesis on September 21.  Was discharged on torsemide 40 mg twice a day.  Is been following with [name] for possible MitraClip, but per chart review it appears that she and her family are opting for palliative approach at this point.  My evaluation emergency department, she was in atrial fibrillation with a heart rate of 85, blood pressure 135/88, oxygen saturation of 95% on continuous BiPAP.  Labs significant for white blood cell count of 13.9 thousand, hemoglobin 8.2, platelet 391.  BNP elevated 17,000.  GFR 11.  Glucose 412.  SARS-CoV-2 negative.  Chest x-ray personally reviewed with moderate bilateral pleural effusions, right-sided dual-chamber ICD, and some element of pulmonary venous congestion.  EKG personally reviewed shows atrial fibrillation with RV pacing pattern which replaces prior native conduction with frequent PVCs on her last EKG.   Past Medical History   Past Medical History:  Diagnosis Date  . Acute CHF (congestive heart failure) ([place]) 05/25/2019  .  Anemia associated with stage 4 chronic renal failure ([place]) 05/17/2019  . Aortic stenosis 02/14/2019  . Arthritis   . CAD (coronary artery disease) 08/16/2018  . Chronic kidney disease   . Chronic systolic  dysfunction of left ventricle   . CKD stage G4/A1, GFR 15-29 and albumin creatinine ratio <30 mg/g (HCC) 05/17/2019  . Coronary artery disease   . Diabetes mellitus without complication ([place])   . Diverticulitis   . History of MI (myocardial infarction) 08/16/2018  . HTN (hypertension) 08/16/2018  . Hyperlipidemia associated with type 2 diabetes mellitus ([place]) 08/16/2018  . Hypertension   . Hypothyroid 08/16/2018  . Hypothyroidism   . ICD (implantable cardioverter-defibrillator) in place 07/06/2019  . Malignant neoplasm of upper-outer quadrant of left breast in female, estrogen receptor positive ([place]) 03/02/2019  . Mitral regurgitation moderate to severe based on echocardiogram in summer 2020 11/04/2018  . Myocardial infarction ([place])   . Pacemaker 06/29/2019  . Paroxysmal atrial fibrillation (H[facility])   . Pleural effusion on left 06/29/2019  . Sinus bradycardia 06/17/2019  . Thyroid disease   . Transaminitis 06/17/2019  . Type 2 diabetes mellitus with other circulatory complications ([place]) 1/49/7026  . V-tach [facility]) 06/17/2019    Past Surgical History:  Procedure Laterality Date  . APPENDECTOMY  1936  . BUBBLE STUDY  11/02/2019   Procedure: BUBBLE STUDY;  Surgeon: [name], MD;  Location: [place];  Service: Cardiology;;  . CARDIOVERSION N/A 11/07/2019   Procedure: CARDIOVERSION;  Surgeon: Dorothy Spark, MD;  Location: [place];  Service: Cardiovascular;  Laterality: N/A;  . ICD IMPLANT N/A 06/20/2019   Procedure: ICD IMPLANT;  Surgeon: Evans Lance, MD;  Location: [place] [facility];  Service: Cardiovascular;  Laterality: N/A;  . IR THORACENTESIS ASP PLEURAL SPACE W/IMG GUIDE  05/27/2019  . IR THORACENTESIS ASP PLEURAL SPACE W/IMG GUIDE  11/08/2019  . PARS PLANA VITRECTOMY Right 12/04/2018   Procedure: RETINAL DETACHMENT REPAIR PPV 25 GAUGE WITH ENDO LASER AIR/FLUID EXCHANGE SF6 GAS INJECTION;  Surgeon: Jalene Mullet, MD;  Location: [place];  Service: Ophthalmology;  Laterality:  Right;  . TEE WITHOUT CARDIOVERSION N/A 11/02/2019   Procedure: TRANSESOPHAGEAL ECHOCARDIOGRAM (TEE);  Surgeon: [name], MD;  Location: [place];  Service: Cardiology;  Laterality: N/A;  . TONSILLECTOMY       Allergies Allergies  Allergen Reactions  . Amiodarone Other (See Comments)    Prolonged QT, VT  . Aranesp (Albumin Free) [Darbepoetin Alfa] Other (See Comments)    Patient had cardiac arrest evening after receiving this  . Tape Itching, Rash and Other (See Comments)    Reaction was from the surgical tape from cyst removed  . Latex Itching    Home Medications    Prior to Admission medications   Medication Sig Start Date End Date Taking? Authorizing Provider  amiodarone (PACERONE) 200 MG tablet Take 1 tablet (200 mg total) by mouth 2 (two) times daily. 11/10/19  Yes Sande Rives E, PA-C  apixaban (ELIQUIS) 2.5 MG TABS tablet Take 1 tablet (2.5 mg total) by mouth 2 (two) times daily. 03/10/19  Yes Loel Dubonnet, NP  atorvastatin (LIPITOR) 40 MG tablet TAKE 1 TABLET BY MOUTH EVERY DAY 10/18/19  Yes Midge Minium, MD  isosorbide dinitrate (ISORDIL) 20 MG tablet TAKE 2 TABLETS (40MG ) BY MOUTH TWICE DAILY 11/08/19  Yes Midge Minium, MD  levothyroxine (SYNTHROID) 175 MCG tablet Take 1 tablet (175 mcg total) by mouth daily before breakfast. 11/17/19  Yes Burtis Junes,  NP  LORazepam (ATIVAN) 1 MG tablet Take 1 tablet (1 mg total) by mouth 2 (two) times daily. 11/18/19  Yes Midge Minium, MD  metoprolol succinate (TOPROL-XL) 50 MG 24 hr tablet Take 1 tablet (50 mg total) by mouth daily. Take with or immediately following a meal. 11/21/19 11/15/20 Yes Sherren Mocha, MD  torsemide (DEMADEX) 20 MG tablet Take 3 tablets (60 mg total) by mouth daily. 11/21/19 11/15/20 Yes Sherren Mocha, MD  acetaminophen (TYLENOL) 650 MG CR tablet Take 1,300 mg by mouth every 8 (eight) hours as needed for pain.     [provider]  anastrozole (ARIMIDEX) 1 MG tablet  TAKE 1 TABLET BY MOUTH EVERY DAY 09/15/19   Magrinat, Virgie Dad, MD  b complex vitamins capsule Take 1 capsule by mouth daily.    [provider]  Biotin 1 MG CAPS Take 1 mg by mouth daily.     [provider]  Cholecalciferol (VITAMIN D3) 50 MCG (2000 UT) TABS Take 2,000 Units by mouth at bedtime.     [provider]  Ferrous Sulfate (IRON) 325 (65 Fe) MG TABS Take 325 mg by mouth daily with breakfast.     [provider]  fluticasone (FLONASE) 50 MCG/ACT nasal spray Place 1 spray into both nostrils in the morning and at bedtime.    [provider]  magnesium oxide (MAG-OX) 400 MG tablet Take 400 mg by mouth at bedtime.     [provider]  nitroGLYCERIN (NITROSTAT) 0.4 MG SL tablet Place 1 tablet (0.4 mg total) under the tongue every 5 (five) minutes as needed for chest pain. 09/28/19   Midge Minium, MD  potassium chloride SA (KLOR-CON) 20 MEQ tablet Take 1 tablet (20 mEq total) by mouth daily. 11/21/19 11/15/20  Sherren Mocha, MD  triamcinolone ointment (KENALOG) 0.1 % Apply 1 application topically 2 (two) times daily. 11/18/19 11/17/20  Midge Minium, MD  vitamin C (ASCORBIC ACID) 500 MG tablet Take 500 mg by mouth daily.    [provider]    Family History    Family History  Problem Relation Age of Onset  . Arthritis Mother   . Heart disease Mother   . Hypertension Mother   . Arthritis Father   . Heart attack Father   . Heart disease Father   . Arthritis Daughter   . Arthritis Son   . Depression Maternal Aunt   . Hyperlipidemia Maternal Aunt   . Hypertension Maternal Aunt    She indicated that her mother is deceased. She indicated that her father is deceased. She indicated that her maternal grandmother is deceased. She indicated that her maternal grandfather is deceased. She indicated that her paternal grandmother is deceased. She indicated that her paternal grandfather is deceased. She indicated that her  daughter is alive. She indicated that her son is alive. She indicated that the status of her maternal aunt is unknown.   Social History    Social History   Socioeconomic History  . Marital status: Married    Spouse name: Not on file  . Number of children: Not on file  . Years of education: Not on file  . Highest education level: Not on file  Occupational History  . Not on file  Tobacco Use  . Smoking status: Former Research scientist (life sciences)  . Smokeless tobacco: Never Used  Vaping Use  . Vaping Use: Never used  Substance and Sexual Activity  . Alcohol use: Yes    Comment: Very rare  .  Drug use: Not Currently  . Sexual activity: Not Currently  Other Topics Concern  . Not on file  Social History Narrative  . Not on file   Social Determinants of Health   Financial Resource Strain:   . Difficulty of Paying Living Expenses: Not on file  Food Insecurity:   . Worried About Charity fundraiser in the Last Year: Not on file  . Ran Out of Food in the Last Year: Not on file  Transportation Needs:   . Lack of Transportation (Medical): Not on file  . Lack of Transportation (Non-Medical): Not on file  Physical Activity:   . Days of Exercise per Week: Not on file  . Minutes of Exercise per Session: Not on file  Stress:   . Feeling of Stress : Not on file  Social Connections:   . Frequency of Communication with Friends and Family: Not on file  . Frequency of Social Gatherings with Friends and Family: Not on file  . Attends Religious Services: Not on file  . Active Member of Clubs or Organizations: Not on file  . Attends Archivist Meetings: Not on file  . Marital Status: Not on file  Intimate Partner Violence:   . Fear of Current or Ex-Partner: Not on file  . Emotionally Abused: Not on file  . Physically Abused: Not on file  . Sexually Abused: Not on file     Review of Systems    General:  No chills, fever, night sweats or weight changes.  Cardiovascular:  No chest pain, dyspnea  on exertion, edema, orthopnea, palpitations, paroxysmal nocturnal dyspnea. Dermatological: No rash, lesions/masses Respiratory: No cough, dyspnea Urologic: No hematuria, dysuria Abdominal:   No nausea, vomiting, diarrhea, bright red blood per rectum, melena, or hematemesis Neurologic:  No visual changes, wkns, changes in mental status. All other systems reviewed and are otherwise negative except as noted above.  Physical Exam    BP 123/71 (BP Location: Left Arm)   Pulse 76   Temp 97.6 F (36.4 C) (Oral)   SpO2 95%  General: Alert and oriented to time and person and place breathing relatively comfortably on noninvasive positive pressure ventilation. HEENT: Normal  Neck: JVP elevated to the angle of the mandible sitting upwards. Lungs: Modest tachypnea with diminished breath sounds at the bilateral bases. Heart: Regular, nontachycardic rhythm with no S3 or S4 and 2/6 holosystolic murmur best appreciated the apex radiating to the axilla. Abdomen: Soft, non-tender, non-distended, BS +.  Extremities: Upper extremities with bilateral 2+ edema extending to the presacral area. Psych: Normal affect. Neuro: Alert and oriented. No gross focal deficits. No abnormal movements.  Labs    Troponin (Point of Care Test) No results for input(s): TROPIPOC in the last 72 hours. No results for input(s): CKTOTAL, CKMB, TROPONINI in the last 72 hours. Lab Results  Component Value Date   WBC 13.9 (H) 11/22/2019   HGB 8.8 (L) 11/22/2019   HCT 26.0 (L) 11/22/2019   MCV 92.9 11/22/2019   PLT 391 11/22/2019    Recent Labs  Lab 11/16/19 1138 11/16/19 1138 11/22/19 1859 11/22/19 1859 11/22/19 2118  NA 138  --  133*   < > 130*  K 3.6   < > 5.1   < > 4.9  CL 95*   < > 93*  --   --   CO2 26   < > 26  --   --   BUN 80*  --  98*  --   --  CREATININE 2.97*   < > 3.40*  --   --   CALCIUM 9.2   < > 9.0  --   --   PROT 6.2  --   --   --   --   BILITOT 0.3  --   --   --   --   ALKPHOS 73  --   --   --    --   ALT 9  --   --   --   --   AST 11  --   --   --   --   GLUCOSE 310*  --  412*  --   --    < > = values in this interval not displayed.   Lab Results  Component Value Date   CHOL 109 12/29/2018   HDL 48.40 12/29/2018   LDLCALC 30 12/29/2018   TRIG 154.0 (H) 12/29/2018   No results found for: [facility]   Radiology Studies    DG Chest 1 View  Result Date: 11/08/2019 CLINICAL DATA:  Left-sided thoracentesis EXAM: CHEST  1 VIEW COMPARISON:  Earlier today FINDINGS: No residual left pleural fluid is seen. No re-expansion edema or visible pneumothorax. Stable hazy density at the right base partially obscured by pacer generator pack. Cardiopericardial enlargement with ICD/pacer leads from the right. IMPRESSION: No evidence of complication or residual fluid after left thoracentesis. Electronically Signed   By: Monte Fantasia M.D.   On: 11/08/2019 11:49   DG Chest 2 View  Result Date: 11/03/2019 CLINICAL DATA:  Shortness of breath. EXAM: CHEST - 2 VIEW COMPARISON:  October 20, 2019. FINDINGS: Stable cardiomegaly. Right-sided pacemaker is unchanged in position. No pneumothorax is noted. Right lung is clear. Moderate left pleural effusion is noted with probable underlying atelectasis or infiltrate. Bony thorax is unremarkable. IMPRESSION: Moderate left pleural effusion with probable underlying atelectasis or infiltrate. Aortic Atherosclerosis (ICD10-I70.0). Electronically Signed   By: Marijo Conception M.D.   On: 11/03/2019 11:40   DG Chest Port 1 View  Result Date: 11/22/2019 CLINICAL DATA:  Shortness of breath EXAM: PORTABLE CHEST 1 VIEW COMPARISON:  Chest x-ray 11/09/2019, chest x-ray 11/08/2019 FINDINGS: Right chest wall dual lead pacemaker in stable position. The heart size and mediastinal contours are unchanged. Aortic arch calcification. Hazy airspace opacity within bilateral mid to lower lung zones could represent atelectasis versus infection/inflammation. No pulmonary edema. Slight interval  increase in size of a at least small right pleural effusion. Slight interval increase in size of an at least small left pleural effusion. No pneumothorax. No acute osseous abnormality. IMPRESSION: Interval increase in size of bilateral small pleural effusions. Underlying infection/inflammation is not excluded. Electronically Signed   By: Iven Finn M.D.   On: 11/22/2019 20:53   DG Chest Port 1 View  Result Date: 11/09/2019 CLINICAL DATA:  Shortness of breath. EXAM: PORTABLE CHEST 1 VIEW COMPARISON:  November 08, 2019 FINDINGS: A dual lead AICD is noted. Mild atelectasis and/or infiltrate is seen within the left lung base. There is no evidence of a pleural effusion or pneumothorax. The cardiac silhouette is markedly enlarged and unchanged in size. There is moderate severity calcification of the aortic arch. Degenerative changes seen throughout the thoracic spine. IMPRESSION: Mild left basilar atelectasis and/or infiltrate. Electronically Signed   By: Virgina Norfolk M.D.   On: 11/09/2019 17:38   DG CHEST PORT 1 VIEW  Result Date: 11/08/2019 CLINICAL DATA:  Shortness of breath with pleural effusion EXAM: PORTABLE CHEST 1 VIEW COMPARISON:  November 05, 2019 FINDINGS: Moderate left pleural effusion noted with atelectasis and consolidation in the left lower lobe region. Note that a portion of the right base is obscured by pacemaker device. There is mild medial right base atelectasis. Right lung otherwise clear. Heart is enlarged with pulmonary vascularity normal, stable. Pacemaker leads attached to right atrium and right ventricle. No adenopathy. No bone lesions. IMPRESSION: Moderate left pleural effusion with atelectasis and probable consolidation left base. Mild atelectasis medial right base. Note that a portion of the right base is obscured by pacemaker device. Stable cardiomegaly. Pacemaker leads attached to right atrium right ventricle. Electronically Signed   By: Lowella Grip III M.D.   On:  11/08/2019 08:07   DG CHEST PORT 1 VIEW  Result Date: 11/05/2019 CLINICAL DATA:  Dyspnea on exertion. EXAM: PORTABLE CHEST 1 VIEW COMPARISON:  11/03/2019 FINDINGS: Right-sided pacemaker unchanged. Slight interval worsening moderate left base opacification likely moderate size effusion with atelectasis. Suggestion of small right pleural effusion with atelectasis. Stable cardiomegaly. Remainder the exam is unchanged. IMPRESSION: 1. Slight interval worsening moderate left base opacification likely moderate size effusion with atelectasis. Suggestion of small right effusion with atelectasis. 2. Stable cardiomegaly. Electronically Signed   By: Marin Olp M.D.   On: 11/05/2019 15:19   ECHO TEE  Result Date: 11/03/2019    TRANSESOPHOGEAL ECHO REPORT   Patient Name:   Frutoso Schatz Date of Exam: 11/02/2019 Medical Rec #:  [id number]      Height:       64.0 in Accession #:    [id number]     Weight:       167.0 lb Date of Birth:  05/20/1929      BSA:          1.812 m Patient Age:    42 years       BP:           128/89 mmHg Patient Gender: F              HR:           105 bpm. Exam Location:  Inpatient Procedure: Transesophageal Echo, 3D Echo, Color Doppler and Cardiac Doppler                               MODIFIED REPORT: This report was modified by Cherlynn Kaiser MD on 11/03/2019 due to revision.  Indications:     Mitral Regurgitation  History:         Patient has prior history of Echocardiogram examinations, most                  recent 10/22/2019. CHF, Previous Myocardial Infarction and CAD,                  Pacemaker, Arrythmias:Atrial Fibrillation; Risk                  Factors:Diabetes and Hypertension.  Sonographer:     Mikki Santee RDCS (AE) Referring Phys:  1993 RHONDA G BARRETT Diagnosing Phys: Cherlynn Kaiser MD PROCEDURE: After discussion of the risks and benefits of a TEE, an informed consent was obtained from the patient. TEE procedure time was 31 minutes. The transesophogeal probe was passed without  difficulty through the esophogus of the patient. Imaged were obtained with the patient in a left lateral decubitus position. Local oropharyngeal anesthetic was provided with Cetacaine. Sedation performed by different physician. The patient was monitored while  under deep sedation. Anesthestetic sedation was provided intravenously by Anesthesiology: 179.71mg  of Propofol, 100mg  of Lidocaine. Image quality was good. The patient's vital signs; including heart rate, blood pressure, and oxygen saturation; remained stable throughout the procedure. The patient developed no complications during the procedure. IMPRESSIONS  1. Left ventricular ejection fraction, by estimation, is 50%. The left ventricle has low normal function.  2. Right ventricular systolic function is mildly reduced. The right ventricular size is normal.  3. Left atrial size was severely dilated. No left atrial/left atrial appendage thrombus was detected. The LAA emptying velocity was 37 cm/s.  4. Right atrial size was severely dilated.  5. Moderate pericardial effusion.  6. Prolapse of A2 scallop with severe, eccentric mitral valve regurgitation. Multiple jets. Dominant jet arises at A2 P2 scallop. Dominant jet ERO 0.18, RV 24 mL.     Transcatheter Edge to Edge repair measurements:     PMVL length 9 mm     Flail gap 5 mm     Flail width 5 mm     Coaptation depth 8 mm     Coaptation length 3.4 mm     No significant leaflet calcifications. The mitral valve is degenerative. Findings suggest Carpentier type II primary mitral valve regurgitation.  7. Tricuspid valve regurgitation is mild to moderate.  8. The aortic valve is calcified. There is moderate calcification of the aortic valve. Aortic valve regurgitation is trivial.  9. There is Moderate (Grade III) atheroma plaque involving the transverse and descending aorta. 10. Agitated saline contrast bubble study was negative, with no evidence of any interatrial shunt. FINDINGS  Left Ventricle: Left ventricular  ejection fraction, by estimation, is 50%. The left ventricle has low normal function. The left ventricular internal cavity size was normal in size. Right Ventricle: The right ventricular size is normal. No increase in right ventricular wall thickness. Right ventricular systolic function is mildly reduced. Left Atrium: Left atrial size was severely dilated. No left atrial/left atrial appendage thrombus was detected. The LAA emptying velocity was 37 cm/s. Right Atrium: Right atrial size was severely dilated. Pericardium: A moderately sized pericardial effusion is present. Mitral Valve: Prolapse of A2 scallop with severe, eccentric mitral valve regurgitation. Multiple jets. Dominant jet arises at A2 P2 scallop. Dominant jet ERO 0.18, RV 24 mL. Transcatheter Edge to Edge repair measurements: PMVL length 9 mm Flail gap 5 mm Flail width 5 mm Coaptation depth 8 mm Coaptation length 3.4 mm No significant leaflet calcifications. The mitral valve is degenerative in appearance. Findings suggest Carpentier type II primary mitral valve regurgitation. The mean mitral valve gradient is 2.4 mmHg with average heart rate of 95 bpm. Tricuspid Valve: The tricuspid valve is normal in structure. Tricuspid valve regurgitation is mild to moderate. Aortic Valve: The aortic valve is calcified. There is moderate calcification of the aortic valve. Aortic valve regurgitation is trivial. Pulmonic Valve: The pulmonic valve was normal in structure. Pulmonic valve regurgitation is trivial. Aorta: The aortic root and ascending aorta are structurally normal, with no evidence of dilitation. There is moderate (Grade III) atheroma plaque involving the transverse and descending aorta. IAS/Shunts: No atrial level shunt detected by color flow Doppler. Agitated saline contrast was given intravenously to evaluate for intracardiac shunting. Agitated saline contrast bubble study was negative, with no evidence of any interatrial shunt. Additional Comments: A  pacer wire is visualized in the right atrium and right ventricle.  MITRAL VALVE MV Mean grad: 2.4 mmHg MR Peak grad:   85.4 mmHg MR Mean grad:  57.0 mmHg MR Vmax:        462.00 cm/s MR Vmean:       360.0 cm/s MR PISA:        2.26 cm MR PISA Radius: 0.60 cm Cherlynn Kaiser MD Electronically signed by Cherlynn Kaiser MD Signature Date/Time: 11/03/2019/3:11:56 PM    Final (Updated)    CUP PACEART REMOTE DEVICE CHECK  Result Date: 11/03/2019 Scheduled remote reviewed. Normal device function.  Persistent AF with RVR, on OAC, there is not good ventricular rate control as heart rates are 110-140 bpm.  Sent to triage. Next remote 91 days. Kathy Breach, RN, CCDS, [employer] Scheduled remote reviewed. Normal device function.  Persistent AF with RVR, on OAC, there is not good ventricular rate control as heart rates are 110-140 bpm.  Sent to triage. Next remote 91 days. Kathy Breach, RN, CCDS, [employer] [employer]  ECHOCARDIOGRAM LIMITED  Result Date: 11/21/2019    ECHOCARDIOGRAM LIMITED REPORT   Patient Name:   ANGELICA FRANDSEN Date of Exam: 11/21/2019 Medical Rec #:  [id number]      Height:       64.0 in Accession #:    [id number]     Weight:       170.6 lb Date of Birth:  Oct 20, 1929      BSA:          1.829 m Patient Age:    4 years       BP:           120/80 mmHg Patient Gender: F              HR:           111 bpm. Exam Location:  [address] Procedure: Limited Echo, Cardiac Doppler and Limited Color Doppler Indications:    I31.3 Pericardial effusion. LIMITED to follow up pericardial                 effusion.  History:        Patient has prior history of Echocardiogram examinations, most                 recent 11/03/2019. CHF, CAD and Previous Myocardial Infarction,                 Defibrillator, Aortic Valve Disease, Arrythmias:Atrial                 Fibrillation and VT. Bradycardia.; Risk Factors:Diabetes,                 Hypertension, Dyslipidemia and Former Smoker. Breast cancer.                  Anemia.  Sonographer:    Jessee Avers, RDCS Referring Phys: [place]  1. Mild global hypokinesis slightly worse in the septum. Left ventricular ejection fraction, by estimation, is 45 to 50%. The left ventricle has mildly decreased function. The left ventricle demonstrates global hypokinesis.  2. Right ventricular systolic function is normal. The right ventricular size is normal. There is mildly elevated pulmonary artery systolic pressure.  3. Left atrial size was severely dilated.  4. A small pericardial effusion is present. The pericardial effusion is posterior to the left ventricle.  5. Severe mitral regurgitation with Coanda effect and pulmonary vein flow reversal. The mitral valve is normal in structure. Severe mitral valve regurgitation. No evidence of mitral stenosis.  6. Tricuspid valve regurgitation is moderate.  7. Aortic valve leaflets are restricted. Marland Kitchen  The aortic valve is normal in structure. There is moderate calcification of the aortic valve. There is moderate thickening of the aortic valve. Aortic valve regurgitation is mild. Mild aortic valve stenosis. Aortic valve area, by VTI measures 1.38 cm. Aortic valve mean gradient measures 9.8 mmHg. Aortic valve Vmax measures 2.14 m/s.  8. Aortic dilatation noted. There is mild dilatation of the ascending aorta, measuring 38 mm.  9. The inferior vena cava is normal in size with greater than 50% respiratory variability, suggesting right atrial pressure of 3 mmHg. Comparison(s): TEE 11/03/19 EF 50%. Severe MR. Moderate pericardial effusion. TTE 10/22/19 EF 45-50%. Severe MR. Small pericardial effusion. FINDINGS  Left Ventricle: Mild global hypokinesis slightly worse in the septum. Left ventricular ejection fraction, by estimation, is 45 to 50%. The left ventricle has mildly decreased function. The left ventricle demonstrates global hypokinesis. The left ventricular internal cavity size was normal in size. There is no left ventricular  hypertrophy. Right Ventricle: The right ventricular size is normal. No increase in right ventricular wall thickness. Right ventricular systolic function is normal. There is mildly elevated pulmonary artery systolic pressure. The tricuspid regurgitant velocity is 3.20  m/s, and with an assumed right atrial pressure of 3 mmHg, the estimated right ventricular systolic pressure is 95.6 mmHg. Left Atrium: Left atrial size was severely dilated. Right Atrium: Right atrial size was normal in size. Pericardium: A small pericardial effusion is present. The pericardial effusion is posterior to the left ventricle. Mitral Valve: Severe mitral regurgitation with Coanda effect and pulmonary vein flow reversal. The mitral valve is normal in structure. Severe mitral valve regurgitation, with posteriorly-directed jet. No evidence of mitral valve stenosis. Tricuspid Valve: The tricuspid valve is normal in structure. Tricuspid valve regurgitation is moderate . No evidence of tricuspid stenosis. Aortic Valve: Aortic valve leaflets are restricted. The aortic valve is normal in structure. There is moderate calcification of the aortic valve. There is moderate thickening of the aortic valve. Aortic valve regurgitation is mild. Aortic regurgitation PHT measures 280 msec. Mild aortic stenosis is present. Aortic valve mean gradient measures 9.8 mmHg. Aortic valve peak gradient measures 18.4 mmHg. Aortic valve area, by VTI measures 1.38 cm. Pulmonic Valve: The pulmonic valve was normal in structure. Pulmonic valve regurgitation is not visualized. No evidence of pulmonic stenosis. Aorta: Aortic dilatation noted. There is mild dilatation of the ascending aorta, measuring 38 mm. Venous: The inferior vena cava is normal in size with greater than 50% respiratory variability, suggesting right atrial pressure of 3 mmHg. IAS/Shunts: No atrial level shunt detected by color flow Doppler. LEFT VENTRICLE PLAX 2D LVIDd:         4.50 cm LVIDs:         3.50  cm LV PW:         1.20 cm LV IVS:        1.00 cm LVOT diam:     2.10 cm LV SV:         48 LV SV Index:   26 LVOT Area:     3.46 cm  RIGHT VENTRICLE RVSP:           49.0 mmHg LEFT ATRIUM              Index       RIGHT ATRIUM LA diam:        4.30 cm  2.35 cm/m  RA Pressure: 8.00 mmHg LA Vol (A2C):   107.0 ml 58.52 ml/m LA Vol (A4C):   96.6 ml  52.83  ml/m LA Biplane Vol: 104.0 ml 56.88 ml/m  AORTIC VALVE AV Area (Vmax):    1.37 cm AV Area (Vmean):   1.52 cm AV Area (VTI):     1.38 cm AV Vmax:           214.50 cm/s AV Vmean:          143.250 cm/s AV VTI:            0.349 m AV Peak Grad:      18.4 mmHg AV Mean Grad:      9.8 mmHg LVOT Vmax:         85.00 cm/s LVOT Vmean:        62.800 cm/s LVOT VTI:          0.139 m LVOT/AV VTI ratio: 0.40 AI PHT:            280 msec  AORTA Ao Root diam: 3.50 cm Ao Asc diam:  3.80 cm MR Peak grad:    136.3 mmHg  TRICUSPID VALVE MR Mean grad:    82.6 mmHg   TR Peak grad:   41.0 mmHg MR Vmax:         583.80 cm/s TR Vmax:        320.00 cm/s MR Vmean:        424.2 cm/s  Estimated RAP:  8.00 mmHg MR PISA:         1.57 cm    RVSP:           49.0 mmHg MR PISA Eff ROA: 10 mm MR PISA Radius:  0.50 cm     SHUNTS                              Systemic VTI:  0.14 m                              Systemic Diam: 2.10 cm Skeet Latch MD Electronically signed by Skeet Latch MD Signature Date/Time: 11/21/2019/4:34:50 PM    Final    [place] EKG SITE RITE  Result Date: 11/05/2019 If Site Rite image not attached, placement could not be confirmed due to current cardiac rhythm.  IR THORACENTESIS ASP PLEURAL SPACE W/IMG GUIDE  Result Date: 11/08/2019 INDICATION: Patient with a history of chronic heart failure and recurrent pleural effusions presents today for a therapeutic thoracentesis. EXAM: ULTRASOUND GUIDED THORACENTESIS MEDICATIONS: 1% lidocaine 10 mL COMPLICATIONS: None immediate. PROCEDURE: An ultrasound guided thoracentesis was thoroughly discussed with the patient and questions  answered. The benefits, risks, alternatives and complications were also discussed. The patient understands and wishes to proceed with the procedure. Written consent was obtained. Ultrasound was performed to localize and mark an adequate pocket of fluid in the left chest. The area was then prepped and draped in the normal sterile fashion. 1% Lidocaine was used for local anesthesia. Under ultrasound guidance a 6 Fr Safe-T-Centesis catheter was introduced. Thoracentesis was performed. The catheter was removed and a dressing applied. FINDINGS: A total of approximately 800 mL of light red fluid was removed. IMPRESSION: Successful ultrasound guided left thoracentesis yielding 800 mL of pleural fluid. Read by: Soyla Dryer, NP Electronically Signed   By: Ruthann Cancer MD   On: 11/08/2019 11:59    ECG & Cardiac Imaging   Echocardiogram yesterday with LVEF 45 to 50% with global mild hypokinesis, normal RV systolic function, severely dilated left atrium, small pericardial  effusion posterior.  Severe mitral regurgitation.  TEE September 15 with low normal LVEF, RV systolic function mildly reduced, severely dilated left atrium, severely dilated right atrium, moderate pericardial effusion, mitral valve prolapse of A2 scallop with severe eccentric MR, grade 3 atheroma of the transverse and descending aorta.  [employer] dual-chamber ICD May 2021.  Assessment & Plan    This is an 86 year old female with comorbidities significant for severe mitral regurgitation, atrial fibrillation on amiodarone for rate control strategy, CKD 4, recurrent heart failure with mildly reduced ejection fraction, ischemic cardiomyopathy with CTO of RCA, hypothyroidism, ER positive breast cancer, recurrent pleural effusions, who presents with acute decompensated heart failure.  Unclear precipitant of her heart failure at this time.  Echocardiogram just yesterday without recurrence of her pericardial effusion she has left as well as right  sided symptoms.  Suspect that there was an element of underdosing of diuretics with her progressive renal failure.  Given continuous noninvasive positive pressure ventilation and severe decompensation will admit to ICU for close monitoring and aggressive parenteral diuresis while definitive plans as her goals of care are made.  She did emphasize to me, and her husband is emphasized to multiple providers there would prefer that she not be resuscitated in the event of cardiopulmonary arrest.  I offered to deactivate her [place] dual-chamber ICD but she would prefer that this remain in place at this time.   Plan #Acute decompensated systolic and diastolic heart failure.  [place] Heart Association class IV.  Due to severe mitral valvular disease and arrhythmia. #Severe mitral regurgitation -Hold beta-blocker and calcium channel blocker -No ACE/ARB/Arni given renal insufficiency -Furosemide 100 mg IV x1 with furosemide infusion at 10 mics per hour and adjunctive thiazide diuretic if she does not respond. -No response to aggressive diuretic regimen can consider some augmentation with inotropes if it is within scope with their goals of care. -Care consultation. -ICD remains active in place and any changes to therapies today.  #Atrial fibrillation -Continue amiodarone 200 mg twice daily -Continue apixaban 2.5 mg twice daily.  Prefer to avoid IV load with heparin and plan for palliative approach without plan for invasive procedures.  #Acute hypoxemic respiratory failure -Continue noninvasive positive pressure ventilation.  If no improvement with diuresis and bilateral pleural effusions left to consider repeat thoracentesis. -No fever or cough we will avoid antibiotics at this time. -Urine, blood, sputum cultures.  #Acute on chronic renal failure, secondary to cardiorenal. -As above.  #Hyperglycemia -Sliding scale insulin  Malnutrition: Nutrition: P.o. on BiPAP DVT ppx: Therapeutic  anticoagulation Advanced Care Planning: DNR/DNI.  Signed, Delight Hoh, MD 11/22/2019, 9:40 PM

## 2019-11-22 NOTE — ED Provider Notes (Signed)
[facility] EMERGENCY DEPARTMENT Provider Note   CSN: [id number] Arrival date & time: 11/22/19  1840     History No chief complaint on file.   Lauren Walker is a 86 y.o. female with history of mitral regurgitation, persistent A. fib, combined systolic/diastolic CHF, ICD, pericardial effusion, CKD, CAD, diabetes, hyperlipidemia, hypertension brought to the ER by EMS for shortness of breath.  Patient is hypoxic 85% on room air, requiring 3 L [place].  Does not use oxygen at home.  Reports sudden onset of increased shortness of breath on exertion and at rest today, productive cough, diffuse chest pressure, generalized weakness.  Per triage note there has been 10 pound weight gain.  On route patient was tripoding to breathe and unable to speak in full sentences.  She received 325 mg aspirin, nitroglycerin x1.  L5 caveat due to acuity of condition edition.  Patient is able to tell me her full name, her husband's name, that she is in the hospital and it is 2021.  Responses are slightly delayed and she appears tired.  Reports chest pressure, shortness of breath, cough and leg swelling.  Has been taking her fluid pills and endorses increased urination since yesterday.  Tells me she saw her cardiologist yesterday in the office..  She is found sitting on the edge of the bed leaning forward.  She was assisted back on the bed.  No family at bedside.  She is using her iphone to call her husband.   HPI     Past Medical History:  Diagnosis Date  . Acute CHF (congestive heart failure) ([place]) 05/25/2019  . Anemia associated with stage 4 chronic renal failure ([place]) 05/17/2019  . Aortic stenosis 02/14/2019  . Arthritis   . CAD (coronary artery disease) 08/16/2018  . Chronic kidney disease   . Chronic systolic dysfunction of left ventricle   . CKD stage G4/A1, GFR 15-29 and albumin creatinine ratio <30 mg/g (HCC) 05/17/2019  . Coronary artery disease   . Diabetes mellitus without complication ([place])    . Diverticulitis   . History of MI (myocardial infarction) 08/16/2018  . HTN (hypertension) 08/16/2018  . Hyperlipidemia associated with type 2 diabetes mellitus ([place]) 08/16/2018  . Hypertension   . Hypothyroid 08/16/2018  . Hypothyroidism   . ICD (implantable cardioverter-defibrillator) in place 07/06/2019  . Malignant neoplasm of upper-outer quadrant of left breast in female, estrogen receptor positive ([place]) 03/02/2019  . Mitral regurgitation moderate to severe based on echocardiogram in summer 2020 11/04/2018  . Myocardial infarction ([place])   . Pacemaker 06/29/2019  . Paroxysmal atrial fibrillation (HCC)   . Pleural effusion on left 06/29/2019  . Sinus bradycardia 06/17/2019  . Thyroid disease   . Transaminitis 06/17/2019  . Type 2 diabetes mellitus with other circulatory complications ([place]) 2/53/6644  . V-tach [facility]) 06/17/2019    Patient Active Problem List   Diagnosis Date Noted  . Cardiogenic shock ([place]) 11/22/2019  . Demand ischemia ([place]) 11/10/2019  . Pericardial effusion 11/10/2019  . Hyperlipidemia 11/10/2019  . Normocytic anemia 11/10/2019  . Anxiety 11/10/2019  . Atrial fibrillation with RVR ([place]) 10/22/2019  . Atrial fibrillation with rapid ventricular response ([place])   . Chest pain of uncertain etiology 03/47/4259  . Persistent atrial fibrillation ([place]) 10/21/2019  . Arthritis of left hip 09/19/2019  . ICD (implantable cardioverter-defibrillator) in place 07/06/2019  . Pleural effusion on left 06/29/2019  . Pacemaker 06/29/2019  . History of VT felt to be secondary to Amiodarone and Prolonged QT s/p  ICD 06/17/2019  . Sinus bradycardia 06/17/2019  . Transaminitis 06/17/2019  . Acute diastolic CHF (congestive heart failure) ([place]) 05/25/2019  . Acute kidney injury superimposed on chronic kidney disease ([place]) 05/17/2019  . Anemia associated with stage 4 chronic renal failure ([place]) 05/17/2019  . Malignant neoplasm of upper-outer quadrant of left breast in female, estrogen  receptor positive ([place]) 03/02/2019  . Aortic stenosis 02/14/2019  . Severe mitral regurgitation 11/04/2018  . Paroxysmal atrial fibrillation ([place]) 10/11/2018  . Hyperlipidemia associated with type 2 diabetes mellitus ([place]) 08/16/2018  . Type 2 diabetes mellitus with other circulatory complications ([place]) 93/79/0240  . Hypothyroid 08/16/2018  . HTN (hypertension) 08/16/2018  . CAD (coronary artery disease) 08/16/2018  . History of MI (myocardial infarction) 08/16/2018    Past Surgical History:  Procedure Laterality Date  . APPENDECTOMY  1936  . BUBBLE STUDY  11/02/2019   Procedure: BUBBLE STUDY;  Surgeon: [name], MD;  Location: [facility];  Service: Cardiology;;  . CARDIOVERSION N/A 11/07/2019   Procedure: CARDIOVERSION;  Surgeon: Dorothy Spark, MD;  Location: [facility];  Service: Cardiovascular;  Laterality: N/A;  . ICD IMPLANT N/A 06/20/2019   Procedure: ICD IMPLANT;  Surgeon: Evans Lance, MD;  Location: [facility] CV LAB;  Service: Cardiovascular;  Laterality: N/A;  . IR THORACENTESIS ASP PLEURAL SPACE W/IMG GUIDE  05/27/2019  . IR THORACENTESIS ASP PLEURAL SPACE W/IMG GUIDE  11/08/2019  . PARS PLANA VITRECTOMY Right 12/04/2018   Procedure: RETINAL DETACHMENT REPAIR PPV 25 GAUGE WITH ENDO LASER AIR/FLUID EXCHANGE SF6 GAS INJECTION;  Surgeon: Jalene Mullet, MD;  Location: [facility];  Service: Ophthalmology;  Laterality: Right;  . TEE WITHOUT CARDIOVERSION N/A 11/02/2019   Procedure: TRANSESOPHAGEAL ECHOCARDIOGRAM (TEE);  Surgeon: [name], MD;  Location: [facility];  Service: Cardiology;  Laterality: N/A;  . TONSILLECTOMY       OB History   No obstetric history on file.     Family History  Problem Relation Age of Onset  . Arthritis Mother   . Heart disease Mother   . Hypertension Mother   . Arthritis Father   . Heart attack Father   . Heart disease Father   . Arthritis Daughter   . Arthritis Son   . Depression Maternal Aunt   . Hyperlipidemia  Maternal Aunt   . Hypertension Maternal Aunt     Social History   Tobacco Use  . Smoking status: Former Research scientist (life sciences)  . Smokeless tobacco: Never Used  Vaping Use  . Vaping Use: Never used  Substance Use Topics  . Alcohol use: Yes    Comment: Very rare  . Drug use: Not Currently    Home Medications Prior to Admission medications   Medication Sig Start Date End Date Taking? Authorizing Provider  amiodarone (PACERONE) 200 MG tablet Take 1 tablet (200 mg total) by mouth 2 (two) times daily. 11/10/19  Yes Sande Rives E, PA-C  apixaban (ELIQUIS) 2.5 MG TABS tablet Take 1 tablet (2.5 mg total) by mouth 2 (two) times daily. 03/10/19  Yes Loel Dubonnet, NP  atorvastatin (LIPITOR) 40 MG tablet TAKE 1 TABLET BY MOUTH EVERY DAY 10/18/19  Yes Midge Minium, MD  isosorbide dinitrate (ISORDIL) 20 MG tablet TAKE 2 TABLETS (40MG ) BY MOUTH TWICE DAILY 11/08/19  Yes Midge Minium, MD  levothyroxine (SYNTHROID) 175 MCG tablet Take 1 tablet (175 mcg total) by mouth daily before breakfast. 11/17/19  Yes Burtis Junes, NP  LORazepam (ATIVAN) 1 MG tablet Take 1 tablet (  1 mg total) by mouth 2 (two) times daily. 11/18/19  Yes Midge Minium, MD  metoprolol succinate (TOPROL-XL) 50 MG 24 hr tablet Take 1 tablet (50 mg total) by mouth daily. Take with or immediately following a meal. 11/21/19 11/15/20 Yes Sherren Mocha, MD  torsemide (DEMADEX) 20 MG tablet Take 3 tablets (60 mg total) by mouth daily. 11/21/19 11/15/20 Yes Sherren Mocha, MD  acetaminophen (TYLENOL) 650 MG CR tablet Take 1,300 mg by mouth every 8 (eight) hours as needed for pain.     [provider]  anastrozole (ARIMIDEX) 1 MG tablet TAKE 1 TABLET BY MOUTH EVERY DAY 09/15/19   Magrinat, Virgie Dad, MD  b complex vitamins capsule Take 1 capsule by mouth daily.    [provider]  Biotin 1 MG CAPS Take 1 mg by mouth daily.     [provider]  Cholecalciferol (VITAMIN D3) 50 MCG (2000 UT) TABS Take 2,000  Units by mouth at bedtime.     [provider]  Ferrous Sulfate (IRON) 325 (65 Fe) MG TABS Take 325 mg by mouth daily with breakfast.     [provider]  fluticasone (FLONASE) 50 MCG/ACT nasal spray Place 1 spray into both nostrils in the morning and at bedtime.    [provider]  magnesium oxide (MAG-OX) 400 MG tablet Take 400 mg by mouth at bedtime.     [provider]  nitroGLYCERIN (NITROSTAT) 0.4 MG SL tablet Place 1 tablet (0.4 mg total) under the tongue every 5 (five) minutes as needed for chest pain. 09/28/19   Midge Minium, MD  potassium chloride SA (KLOR-CON) 20 MEQ tablet Take 1 tablet (20 mEq total) by mouth daily. 11/21/19 11/15/20  Sherren Mocha, MD  triamcinolone ointment (KENALOG) 0.1 % Apply 1 application topically 2 (two) times daily. 11/18/19 11/17/20  Midge Minium, MD  vitamin C (ASCORBIC ACID) 500 MG tablet Take 500 mg by mouth daily.    [provider]    Allergies    Amiodarone, Aranesp (albumin free) [darbepoetin alfa], Tape, and Latex  Review of Systems   Review of Systems  Constitutional: Positive for fatigue.  Respiratory: Positive for cough and shortness of breath.   Cardiovascular: Positive for chest pain and leg swelling.  All other systems reviewed and are negative.   Physical Exam Updated Vital Signs BP 123/71 (BP Location: Left Arm)   Pulse 76   Temp 97.6 F (36.4 C) (Oral)   SpO2 95%   Physical Exam Vitals and nursing note reviewed.  Constitutional:      Appearance: She is well-developed.     Comments: Chronically ill-appearing, appears tired.  No acute distress or extremis  HENT:     Head: Normocephalic and atraumatic.     Right Ear: External ear normal.     Left Ear: External ear normal.     Nose: Nose normal.     Mouth/Throat:     Comments: Lips are dry, yellow/green-tinged sputum in the corners of her mouth Eyes:     General: No scleral icterus.    Conjunctiva/sclera:  Conjunctivae normal.  Cardiovascular:     Rate and Rhythm: Normal rate. Rhythm irregular.     Heart sounds: Normal heart sounds.     Comments: 2+ pitting edema up to knees, symmetric. Pulmonary:     Effort: Respiratory distress present.     Breath sounds: Rhonchi present.     Comments: On 3 L [place].  Tachypneic.  Speaking in 2-3 word  sentences.  Appears tired but in no severe distress.  Not exhibiting obvious air hunger.  Diffuse rhonchi in upper lobes, diminished air sounds in lower lobes bilaterally. Musculoskeletal:        General: No deformity. Normal range of motion.     Cervical back: Normal range of motion and neck supple.  Skin:    General: Skin is warm and dry.     Capillary Refill: Capillary refill takes less than 2 seconds.  Neurological:     Mental Status: She is alert and oriented to person, place, and time.  Psychiatric:        Behavior: Behavior normal.        Thought Content: Thought content normal.        Judgment: Judgment normal.     ED Results / Procedures / Treatments   Labs (all labs ordered are listed, but only abnormal results are displayed) Labs Reviewed  BASIC METABOLIC PANEL - Abnormal; Notable for the following components:      Result Value   Sodium 133 (*)    Chloride 93 (*)    Glucose, Bld 412 (*)    BUN 98 (*)    Creatinine, Ser 3.40 (*)    GFR calc non Af Amer 11 (*)    All other components within normal limits  CBC - Abnormal; Notable for the following components:   WBC 13.9 (*)    RBC 2.97 (*)    Hemoglobin 8.2 (*)    HCT 27.6 (*)    MCHC 29.7 (*)    RDW 16.4 (*)    All other components within normal limits  BRAIN NATRIURETIC PEPTIDE - Abnormal; Notable for the following components:   B Natriuretic Peptide 1,695.3 (*)    All other components within normal limits  I-STAT ARTERIAL BLOOD GAS, ED - Abnormal; Notable for the following components:   pCO2 arterial 52.0 (*)    Bicarbonate 29.0 (*)    Acid-Base Excess 3.0 (*)    Sodium 130 (*)     HCT 26.0 (*)    Hemoglobin 8.8 (*)    All other components within normal limits  RESPIRATORY PANEL BY RT PCR (FLU A&B, COVID)  LACTIC ACID, PLASMA  LACTIC ACID, PLASMA  URINALYSIS, ROUTINE W REFLEX MICROSCOPIC  BASIC METABOLIC PANEL  PROTIME-INR  MAGNESIUM  TSH    EKG None  Radiology DG Chest Port 1 View  Result Date: 11/22/2019 CLINICAL DATA:  Shortness of breath EXAM: PORTABLE CHEST 1 VIEW COMPARISON:  Chest x-ray 11/09/2019, chest x-ray 11/08/2019 FINDINGS: Right chest wall dual lead pacemaker in stable position. The heart size and mediastinal contours are unchanged. Aortic arch calcification. Hazy airspace opacity within bilateral mid to lower lung zones could represent atelectasis versus infection/inflammation. No pulmonary edema. Slight interval increase in size of a at least small right pleural effusion. Slight interval increase in size of an at least small left pleural effusion. No pneumothorax. No acute osseous abnormality. IMPRESSION: Interval increase in size of bilateral small pleural effusions. Underlying infection/inflammation is not excluded. Electronically Signed   By: Iven Finn M.D.   On: 11/22/2019 20:53   ECHOCARDIOGRAM LIMITED  Result Date: 11/21/2019    ECHOCARDIOGRAM LIMITED REPORT   Patient Name:   Lauren Walker Date of Exam: 11/21/2019 Medical Rec #:  [id number]      Height:       64.0 in Accession #:    [id number]     Weight:       170.6 lb Date  of Birth:  02-Feb-1930      BSA:          1.829 m Patient Age:    78 years       BP:           120/80 mmHg Patient Gender: F              HR:           111 bpm. Exam Location:  [address] Procedure: Limited Echo, Cardiac Doppler and Limited Color Doppler Indications:    I31.3 Pericardial effusion. LIMITED to follow up pericardial                 effusion.  History:        Patient has prior history of Echocardiogram examinations, most                 recent 11/03/2019. CHF, CAD and Previous Myocardial Infarction,                  Defibrillator, Aortic Valve Disease, Arrythmias:Atrial                 Fibrillation and VT. Bradycardia.; Risk Factors:Diabetes,                 Hypertension, Dyslipidemia and Former Smoker. Breast cancer.                 Anemia.  Sonographer:    Jessee Avers, RDCS Referring Phys: [place]  1. Mild global hypokinesis slightly worse in the septum. Left ventricular ejection fraction, by estimation, is 45 to 50%. The left ventricle has mildly decreased function. The left ventricle demonstrates global hypokinesis.  2. Right ventricular systolic function is normal. The right ventricular size is normal. There is mildly elevated pulmonary artery systolic pressure.  3. Left atrial size was severely dilated.  4. A small pericardial effusion is present. The pericardial effusion is posterior to the left ventricle.  5. Severe mitral regurgitation with Coanda effect and pulmonary vein flow reversal. The mitral valve is normal in structure. Severe mitral valve regurgitation. No evidence of mitral stenosis.  6. Tricuspid valve regurgitation is moderate.  7. Aortic valve leaflets are restricted. . The aortic valve is normal in structure. There is moderate calcification of the aortic valve. There is moderate thickening of the aortic valve. Aortic valve regurgitation is mild. Mild aortic valve stenosis. Aortic valve area, by VTI measures 1.38 cm. Aortic valve mean gradient measures 9.8 mmHg. Aortic valve Vmax measures 2.14 m/s.  8. Aortic dilatation noted. There is mild dilatation of the ascending aorta, measuring 38 mm.  9. The inferior vena cava is normal in size with greater than 50% respiratory variability, suggesting right atrial pressure of 3 mmHg. Comparison(s): TEE 11/03/19 EF 50%. Severe MR. Moderate pericardial effusion. TTE 10/22/19 EF 45-50%. Severe MR. Small pericardial effusion. FINDINGS  Left Ventricle: Mild global hypokinesis slightly worse in the septum. Left ventricular ejection  fraction, by estimation, is 45 to 50%. The left ventricle has mildly decreased function. The left ventricle demonstrates global hypokinesis. The left ventricular internal cavity size was normal in size. There is no left ventricular hypertrophy. Right Ventricle: The right ventricular size is normal. No increase in right ventricular wall thickness. Right ventricular systolic function is normal. There is mildly elevated pulmonary artery systolic pressure. The tricuspid regurgitant velocity is 3.20  m/s, and with an assumed right atrial pressure of 3 mmHg, the estimated right ventricular systolic pressure is  44.0 mmHg. Left Atrium: Left atrial size was severely dilated. Right Atrium: Right atrial size was normal in size. Pericardium: A small pericardial effusion is present. The pericardial effusion is posterior to the left ventricle. Mitral Valve: Severe mitral regurgitation with Coanda effect and pulmonary vein flow reversal. The mitral valve is normal in structure. Severe mitral valve regurgitation, with posteriorly-directed jet. No evidence of mitral valve stenosis. Tricuspid Valve: The tricuspid valve is normal in structure. Tricuspid valve regurgitation is moderate . No evidence of tricuspid stenosis. Aortic Valve: Aortic valve leaflets are restricted. The aortic valve is normal in structure. There is moderate calcification of the aortic valve. There is moderate thickening of the aortic valve. Aortic valve regurgitation is mild. Aortic regurgitation PHT measures 280 msec. Mild aortic stenosis is present. Aortic valve mean gradient measures 9.8 mmHg. Aortic valve peak gradient measures 18.4 mmHg. Aortic valve area, by VTI measures 1.38 cm. Pulmonic Valve: The pulmonic valve was normal in structure. Pulmonic valve regurgitation is not visualized. No evidence of pulmonic stenosis. Aorta: Aortic dilatation noted. There is mild dilatation of the ascending aorta, measuring 38 mm. Venous: The inferior vena cava is normal  in size with greater than 50% respiratory variability, suggesting right atrial pressure of 3 mmHg. IAS/Shunts: No atrial level shunt detected by color flow Doppler. LEFT VENTRICLE PLAX 2D LVIDd:         4.50 cm LVIDs:         3.50 cm LV PW:         1.20 cm LV IVS:        1.00 cm LVOT diam:     2.10 cm LV SV:         48 LV SV Index:   26 LVOT Area:     3.46 cm  RIGHT VENTRICLE RVSP:           49.0 mmHg LEFT ATRIUM              Index       RIGHT ATRIUM LA diam:        4.30 cm  2.35 cm/m  RA Pressure: 8.00 mmHg LA Vol (A2C):   107.0 ml 58.52 ml/m LA Vol (A4C):   96.6 ml  52.83 ml/m LA Biplane Vol: 104.0 ml 56.88 ml/m  AORTIC VALVE AV Area (Vmax):    1.37 cm AV Area (Vmean):   1.52 cm AV Area (VTI):     1.38 cm AV Vmax:           214.50 cm/s AV Vmean:          143.250 cm/s AV VTI:            0.349 m AV Peak Grad:      18.4 mmHg AV Mean Grad:      9.8 mmHg LVOT Vmax:         85.00 cm/s LVOT Vmean:        62.800 cm/s LVOT VTI:          0.139 m LVOT/AV VTI ratio: 0.40 AI PHT:            280 msec  AORTA Ao Root diam: 3.50 cm Ao Asc diam:  3.80 cm MR Peak grad:    136.3 mmHg  TRICUSPID VALVE MR Mean grad:    82.6 mmHg   TR Peak grad:   41.0 mmHg MR Vmax:         583.80 cm/s TR Vmax:        320.00 cm/s MR  Vmean:        424.2 cm/s  Estimated RAP:  8.00 mmHg MR PISA:         1.57 cm    RVSP:           49.0 mmHg MR PISA Eff ROA: 10 mm MR PISA Radius:  0.50 cm     SHUNTS                              Systemic VTI:  0.14 m                              Systemic Diam: 2.10 cm Skeet Latch MD Electronically signed by Skeet Latch MD Signature Date/Time: 11/21/2019/4:34:50 PM    Final     Procedures .Critical Care Performed by: Kinnie Feil, PA-C Authorized by: Kinnie Feil, PA-C   Critical care provider statement:    Critical care time (minutes):  45   Critical care was necessary to treat or prevent imminent or life-threatening deterioration of the following conditions:  Circulatory failure and  cardiac failure   Critical care was time spent personally by me on the following activities:  Discussions with consultants, evaluation of patient's response to treatment, examination of patient, ordering and performing treatments and interventions, ordering and review of laboratory studies, ordering and review of radiographic studies, pulse oximetry, re-evaluation of patient's condition, obtaining history from patient or surrogate, review of old charts and development of treatment plan with patient or surrogate   I assumed direction of critical care for this patient from another provider in my specialty: no     (including critical care time)  Medications Ordered in ED Medications  sodium chloride flush (NS) 0.9 % injection 3 mL (has no administration in time range)  sodium chloride flush (NS) 0.9 % injection 3 mL (has no administration in time range)  0.9 %  sodium chloride infusion (has no administration in time range)  acetaminophen (TYLENOL) tablet 650 mg (has no administration in time range)  ondansetron (ZOFRAN) injection 4 mg (has no administration in time range)  furosemide (LASIX) 100 mg in dextrose 5 % 50 mL IVPB (has no administration in time range)  furosemide (LASIX) 250 mg in dextrose 5 % 250 mL (1 mg/mL) infusion (has no administration in time range)    ED Course  I have reviewed the triage vital signs and the nursing notes.  Pertinent labs & imaging results that were available during my care of the patient were reviewed by me and considered in my medical decision making (see chart for details).  Clinical Course as of Nov 21 2128  Tue Nov 22, 2019  1944 Hemoglobin(!): 8.2 [CG]  1944 HCT(!): 27.6 [CG]  1944 WBC(!): 13.9 [CG]  1944 Glucose(!): 412 [CG]  1944 Creatinine(!): 3.40 [CG]  1944 GFR, Est Non African American(!): 11 [CG]  1944 Sodium(!): 133 [CG]  1947 B Natriuretic Peptide(!): 1,695.3 [CG]  2118 IMPRESSION: Interval increase in size of bilateral small pleural  effusions. Underlying infection/inflammation is not excluded.  DG Chest Port 1 View [CG]  2119 Ventricular-paced rhythm Abnormal ECG   [CG]  2123 pCO2 arterial(!): 52.0 [CG]    Clinical Course User Index [CG] Arlean Hopping   MDM Rules/Calculators/A&P  Patient's EMR, triage nursing notes reviewed to assist with history and MDM  History of severe ischemic mitral regurgitation, moderate LV dysfunction, CKD with baseline creatinine of 3, GFR 15.  She has had 2 recent hospitalizations for atrial fibrillation with RVR and decompensated heart failure last month leading to worsening weakness, deconditioning.  Mostly wheelchair bound. S/p ICD.  She is not a candidate for valve replacement, LHC.  Seen by [name] cardiology yesterday who suspected patient was in decompensated heart failure.  Lasix was adjusted.  Consideration for conservative/palliative management for any further decline.  ER work-up initiated in triage including lab work, BNP, chest x-ray and EKG.  I have added ABG after my encounter.  ER work-up personally visualized and interpreted  EKG with ventricular paced rhythm.  Chest x-ray shows increased bilateral pleural effusions, underlying infection.  Patient does have a cough with green sputum, mild leukocytosis WBC 13.9.  However no fever, tachycardia.  BNP is markedly elevated 1695, increased from previous at last admission around 900.  Creatinine slightly worse than baseline, 3.40.  GFR 11.  ABG shows CO2 retention 52 but normal pH.  Baseline anemia hemoglobin 8.8.  Ddx includes decompensated heart failure.  This is likely causing demand ischemia.  Primary ACS, infection, PE less likely.   I have ordered Lasix, BiPAP.  Had discussion with patient regarding plan to admit to the hospital for further management.  Explained to patient her heart was becoming weaker causing fluid retention around her lungs causing SOB.  Discussed options  to maintain on nasal cannula vs provide BiPAP to help with her effort of breathing.  She was fully oriented during this discussion and appeared to understand benefits of BiPAP.  She was agreeable to BiPAP.  Brief CODE STATUS discussion had with patient.  She was very clear in her wishes that she does not wish to be intubated, placed on a ventilator or receive chest compressions.  She confirmed DNR/DNI status.  Palliative care consulted.  2130: Patient reevaluated and is stable on BiPAP.  No acute clinical decline.  Patient was discussed with cardiologist who has admitted patient.  Updated Dr Burt Knack on ER POC.   Critical care interventions: Bipap, Lasix bolus/gtt, cardiology admission, palliative care consult.   Shared with EDP. Final Clinical Impression(s) / ED Diagnoses Final diagnoses:  Decompensated heart failure [facility])    Rx / DC Orders ED Discharge Orders    None       Arlean Hopping 21/30/86 [id number]    Delora Fuel, MD 69/62/95 2237

## 2019-11-22 NOTE — Telephone Encounter (Signed)
Lauren Walker, the patient's daughter and DPR, called to request palliative care for the patient.  She states she was with her all morning and said the family needs help. She states it's too much work for Mr. Mattox to try to take care of the patient. She requests someone to come help Lauren Walker bathe and help with her medications. Discussed difference between home health and palliative care and [facility] kept repeating they "just need help."  Many medication changes were made at Dr. Antionette Char visit yesterday. Lauren Walker wishes to be part of the follow-up visit later this month and understands she will be called and placed on speakerphone so she can hear. She requests "help" prior to that visit though and does not want to wait.   Will consult [name] about appropriate "help" to order the patient and call Kathlene back to discuss.

## 2019-11-23 DIAGNOSIS — R570 Cardiogenic shock: Secondary | ICD-10-CM

## 2019-11-23 DIAGNOSIS — I5043 Acute on chronic combined systolic (congestive) and diastolic (congestive) heart failure: Secondary | ICD-10-CM

## 2019-11-23 LAB — BASIC METABOLIC PANEL
Anion gap: 15 (ref 5–15)
BUN: 104 mg/dL — ABNORMAL HIGH (ref 8–23)
CO2: 27 mmol/L (ref 22–32)
Calcium: 8.7 mg/dL — ABNORMAL LOW (ref 8.9–10.3)
Chloride: 92 mmol/L — ABNORMAL LOW (ref 98–111)
Creatinine, Ser: 3.55 mg/dL — ABNORMAL HIGH (ref 0.44–1.00)
GFR calc non Af Amer: 11 mL/min — ABNORMAL LOW (ref >60)
Glucose, Bld: 340 mg/dL — ABNORMAL HIGH (ref 70–99)
Potassium: 4.9 mmol/L (ref 3.5–5.1)
Sodium: 134 mmol/L — ABNORMAL LOW (ref 135–145)

## 2019-11-23 LAB — GLUCOSE, CAPILLARY
Glucose-Capillary: 186 mg/dL — ABNORMAL HIGH (ref 70–99)
Glucose-Capillary: 286 mg/dL — ABNORMAL HIGH (ref 70–99)
Glucose-Capillary: 304 mg/dL — ABNORMAL HIGH (ref 70–99)
Glucose-Capillary: 306 mg/dL — ABNORMAL HIGH (ref 70–99)
Glucose-Capillary: 357 mg/dL — ABNORMAL HIGH (ref 70–99)

## 2019-11-23 LAB — MRSA PCR SCREENING: MRSA by PCR: NEGATIVE

## 2019-11-23 LAB — TSH: TSH: 8.888 u[IU]/mL — ABNORMAL HIGH (ref 0.350–4.500)

## 2019-11-23 MED ORDER — INSULIN ASPART 100 UNIT/ML ~~LOC~~ SOLN
2.0000 [IU] | Freq: Three times a day (TID) | SUBCUTANEOUS | Status: DC
  Administered 2019-11-23 – 2019-11-24 (×3): 2 [IU] via SUBCUTANEOUS

## 2019-11-23 MED ORDER — INSULIN ASPART 100 UNIT/ML ~~LOC~~ SOLN
0.0000 [IU] | Freq: Three times a day (TID) | SUBCUTANEOUS | Status: DC
  Administered 2019-11-23: 11 [IU] via SUBCUTANEOUS
  Administered 2019-11-23: 8 [IU] via SUBCUTANEOUS
  Administered 2019-11-24: 3 [IU] via SUBCUTANEOUS

## 2019-11-23 MED ORDER — METOLAZONE 5 MG PO TABS
5.0000 mg | ORAL_TABLET | Freq: Once | ORAL | Status: AC
  Administered 2019-11-23: 5 mg via ORAL
  Filled 2019-11-23: qty 1

## 2019-11-23 MED ORDER — MELATONIN 5 MG PO TABS
5.0000 mg | ORAL_TABLET | Freq: Every evening | ORAL | Status: DC | PRN
  Administered 2019-11-23 – 2019-11-24 (×2): 5 mg via ORAL
  Filled 2019-11-23 (×2): qty 1

## 2019-11-23 MED ORDER — CHLORHEXIDINE GLUCONATE 0.12 % MT SOLN
15.0000 mL | Freq: Two times a day (BID) | OROMUCOSAL | Status: DC
  Administered 2019-11-23 – 2019-11-29 (×12): 15 mL via OROMUCOSAL
  Filled 2019-11-23 (×10): qty 15

## 2019-11-23 MED ORDER — ORAL CARE MOUTH RINSE
15.0000 mL | Freq: Two times a day (BID) | OROMUCOSAL | Status: DC
  Administered 2019-11-23 – 2019-11-29 (×7): 15 mL via OROMUCOSAL

## 2019-11-23 MED ORDER — INSULIN ASPART 100 UNIT/ML ~~LOC~~ SOLN: 0.0000 [IU] | Freq: Three times a day (TID) | SUBCUTANEOUS | Status: DC

## 2019-11-23 MED ORDER — FLUTICASONE PROPIONATE 50 MCG/ACT NA SUSP
1.0000 | Freq: Two times a day (BID) | NASAL | Status: DC
  Administered 2019-11-23 – 2019-11-29 (×9): 1 via NASAL
  Filled 2019-11-23 (×2): qty 16

## 2019-11-23 MED ORDER — INSULIN ASPART 100 UNIT/ML ~~LOC~~ SOLN
0.0000 [IU] | Freq: Every day | SUBCUTANEOUS | Status: DC
  Administered 2019-11-23: 4 [IU] via SUBCUTANEOUS

## 2019-11-23 MED ORDER — LORAZEPAM 1 MG PO TABS
1.0000 mg | ORAL_TABLET | Freq: Two times a day (BID) | ORAL | Status: DC | PRN
  Administered 2019-11-23: 1 mg via ORAL
  Filled 2019-11-23: qty 1

## 2019-11-23 MED ORDER — FUROSEMIDE 10 MG/ML IJ SOLN
100.0000 mg | Freq: Once | INTRAVENOUS | Status: AC
  Administered 2019-11-23: 100 mg via INTRAVENOUS
  Filled 2019-11-23: qty 10

## 2019-11-23 NOTE — Progress Notes (Signed)
Pt admittited to unit on BiPap. Is A&O and denies pain/discomfort. Is experiencing some SOB and orthopnea but has improved substantially since admission. Is currently on lasix gtt. Cardiology MD notified of CBG:357 and poor urine output on diuretic gtt. Orders placed for SS insulin and PO thiazide diuretic. Will continue to monitor respiratory status and urine output.

## 2019-11-23 NOTE — Progress Notes (Signed)
PMT consult received and chart reviewed. Briefly discussed with patient and husband at bedside. Daughter needs to be involved in [place] discussion. Spoke with daughter, Nunzio Cory via telephone. [place] meeting with patient, husband, daughter tomorrow 10/7 at [place]. Thank you.  NO CHARGE  Ihor Dow, [place], FNP-C Palliative Medicine Team  Phone: [phone] Fax: [phone]

## 2019-11-23 NOTE — Progress Notes (Signed)
Progress Note  Patient Name: Lauren Walker Date of Encounter: 11/23/2019  [facility] Cardiologist: Jenne Campus, MD   Subjective   Short of breath, mildly improved.  Inpatient Medications    Scheduled Meds: . amiodarone  200 mg Oral BID  . apixaban  2.5 mg Oral BID  . atorvastatin  40 mg Oral Daily  . chlorhexidine  15 mL Mouth Rinse BID  . Chlorhexidine Gluconate Cloth  6 each Topical Daily  . insulin aspart  0-15 Units Subcutaneous TID WC  . insulin aspart  0-5 Units Subcutaneous QHS  . insulin aspart  2 Units Subcutaneous TID WC  . levothyroxine  175 mcg Oral QAC breakfast  . mouth rinse  15 mL Mouth Rinse q12n4p  . sodium chloride flush  3 mL Intravenous Q12H   Continuous Infusions: . sodium chloride    . furosemide (LASIX) infusion 20 mg/hr (11/23/19 0700)   PRN Meds: sodium chloride, acetaminophen, ondansetron (ZOFRAN) IV, sodium chloride flush   Vital Signs    Vitals:   11/22/19 2337 11/23/19 0320 11/23/19 0426 11/23/19 0833  BP: 112/66 114/85    Pulse: 91 74    Resp:  (!) 27    Temp:   (!) 96.6 F (35.9 C) 97.7 F (36.5 C)  TempSrc:   Axillary Oral  SpO2: 99%     Weight:        Intake/Output Summary (Last 24 hours) at 11/23/2019 0852 Last data filed at 11/23/2019 0700 Gross per 24 hour  Intake 136.51 ml  Output 175 ml  Net -38.49 ml   Last 3 Weights 11/22/2019 11/21/2019 11/16/2019  Weight (lbs) 178 lb 12.7 oz 175 lb 12.8 oz 170 lb 9.6 oz  Weight (kg) 81.1 kg 79.742 kg 77.384 kg      Telemetry    AFIB, V paced - Personally Reviewed  ECG    AFIB V paced - Personally Reviewed  Physical Exam   GEN: Ill appearing   Neck: No JVD Cardiac: RRR, no murmurs, rubs, or gallops.  Respiratory: Mild crackles bilaterally, increased effort. GI: Soft, nontender, non-distended  MS: 1+ edema; No deformity. Neuro:  Nonfocal  Psych: Normal affect, mildly somber  Labs    High Sensitivity Troponin:   Recent Labs  Lab 11/03/19 1115  11/03/19 1400 11/09/19 1413 11/09/19 1818  TROPONINIHS 19* 20* 15 16      Chemistry Recent Labs  Lab 11/16/19 1138 11/22/19 1859 11/22/19 2118  NA 138 133* 130*  K 3.6 5.1 4.9  CL 95* 93*  --   CO2 26 26  --   GLUCOSE 310* 412*  --   BUN 80* 98*  --   CREATININE 2.97* 3.40*  --   CALCIUM 9.2 9.0  --   PROT 6.2  --   --   ALBUMIN 3.4*  --   --   AST 11  --   --   ALT 9  --   --   ALKPHOS 73  --   --   BILITOT 0.3  --   --   GFRNONAA 14* 11*  --   GFRAA 16*  --   --   ANIONGAP  --  14  --      Hematology Recent Labs  Lab 11/16/19 1138 11/22/19 1859 11/22/19 2118  WBC 11.4* 13.9*  --   RBC 2.78* 2.97*  --   HGB 7.8* 8.2* 8.8*  HCT 24.3* 27.6* 26.0*  MCV 87 92.9  --   MCH 28.1 27.6  --  MCHC 32.1 29.7*  --   RDW 14.1 16.4*  --   PLT 353 391  --     BNP Recent Labs  Lab 11/22/19 1859  BNP 1,695.3*     DDimer No results for input(s): DDIMER in the last 168 hours.   Radiology    DG Chest Port 1 View  Result Date: 11/22/2019 CLINICAL DATA:  Shortness of breath EXAM: PORTABLE CHEST 1 VIEW COMPARISON:  Chest x-ray 11/09/2019, chest x-ray 11/08/2019 FINDINGS: Right chest wall dual lead pacemaker in stable position. The heart size and mediastinal contours are unchanged. Aortic arch calcification. Hazy airspace opacity within bilateral mid to lower lung zones could represent atelectasis versus infection/inflammation. No pulmonary edema. Slight interval increase in size of a at least small right pleural effusion. Slight interval increase in size of an at least small left pleural effusion. No pneumothorax. No acute osseous abnormality. IMPRESSION: Interval increase in size of bilateral small pleural effusions. Underlying infection/inflammation is not excluded. Electronically Signed   By: Iven Finn M.D.   On: 11/22/2019 20:53   ECHOCARDIOGRAM LIMITED  Result Date: 11/21/2019    ECHOCARDIOGRAM LIMITED REPORT   Patient Name:   Lauren Walker Date of Exam:  11/21/2019 Medical Rec #:  [id number]      Height:       64.0 in Accession #:    [id number]     Weight:       170.6 lb Date of Birth:  1930-01-28      BSA:          1.829 m Patient Age:    18 years       BP:           120/80 mmHg Patient Gender: F              HR:           111 bpm. Exam Location:  [address] Procedure: Limited Echo, Cardiac Doppler and Limited Color Doppler Indications:    I31.3 Pericardial effusion. LIMITED to follow up pericardial                 effusion.  History:        Patient has prior history of Echocardiogram examinations, most                 recent 11/03/2019. CHF, CAD and Previous Myocardial Infarction,                 Defibrillator, Aortic Valve Disease, Arrythmias:Atrial                 Fibrillation and VT. Bradycardia.; Risk Factors:Diabetes,                 Hypertension, Dyslipidemia and Former Smoker. Breast cancer.                 Anemia.  Sonographer:    Jessee Avers, RDCS Referring Phys: [place]  1. Mild global hypokinesis slightly worse in the septum. Left ventricular ejection fraction, by estimation, is 45 to 50%. The left ventricle has mildly decreased function. The left ventricle demonstrates global hypokinesis.  2. Right ventricular systolic function is normal. The right ventricular size is normal. There is mildly elevated pulmonary artery systolic pressure.  3. Left atrial size was severely dilated.  4. A small pericardial effusion is present. The pericardial effusion is posterior to the left ventricle.  5. Severe mitral regurgitation with Coanda effect and pulmonary vein flow reversal.  The mitral valve is normal in structure. Severe mitral valve regurgitation. No evidence of mitral stenosis.  6. Tricuspid valve regurgitation is moderate.  7. Aortic valve leaflets are restricted. . The aortic valve is normal in structure. There is moderate calcification of the aortic valve. There is moderate thickening of the aortic valve. Aortic valve  regurgitation is mild. Mild aortic valve stenosis. Aortic valve area, by VTI measures 1.38 cm. Aortic valve mean gradient measures 9.8 mmHg. Aortic valve Vmax measures 2.14 m/s.  8. Aortic dilatation noted. There is mild dilatation of the ascending aorta, measuring 38 mm.  9. The inferior vena cava is normal in size with greater than 50% respiratory variability, suggesting right atrial pressure of 3 mmHg. Comparison(s): TEE 11/03/19 EF 50%. Severe MR. Moderate pericardial effusion. TTE 10/22/19 EF 45-50%. Severe MR. Small pericardial effusion. FINDINGS  Left Ventricle: Mild global hypokinesis slightly worse in the septum. Left ventricular ejection fraction, by estimation, is 45 to 50%. The left ventricle has mildly decreased function. The left ventricle demonstrates global hypokinesis. The left ventricular internal cavity size was normal in size. There is no left ventricular hypertrophy. Right Ventricle: The right ventricular size is normal. No increase in right ventricular wall thickness. Right ventricular systolic function is normal. There is mildly elevated pulmonary artery systolic pressure. The tricuspid regurgitant velocity is 3.20  m/s, and with an assumed right atrial pressure of 3 mmHg, the estimated right ventricular systolic pressure is 25.8 mmHg. Left Atrium: Left atrial size was severely dilated. Right Atrium: Right atrial size was normal in size. Pericardium: A small pericardial effusion is present. The pericardial effusion is posterior to the left ventricle. Mitral Valve: Severe mitral regurgitation with Coanda effect and pulmonary vein flow reversal. The mitral valve is normal in structure. Severe mitral valve regurgitation, with posteriorly-directed jet. No evidence of mitral valve stenosis. Tricuspid Valve: The tricuspid valve is normal in structure. Tricuspid valve regurgitation is moderate . No evidence of tricuspid stenosis. Aortic Valve: Aortic valve leaflets are restricted. The aortic valve is  normal in structure. There is moderate calcification of the aortic valve. There is moderate thickening of the aortic valve. Aortic valve regurgitation is mild. Aortic regurgitation PHT measures 280 msec. Mild aortic stenosis is present. Aortic valve mean gradient measures 9.8 mmHg. Aortic valve peak gradient measures 18.4 mmHg. Aortic valve area, by VTI measures 1.38 cm. Pulmonic Valve: The pulmonic valve was normal in structure. Pulmonic valve regurgitation is not visualized. No evidence of pulmonic stenosis. Aorta: Aortic dilatation noted. There is mild dilatation of the ascending aorta, measuring 38 mm. Venous: The inferior vena cava is normal in size with greater than 50% respiratory variability, suggesting right atrial pressure of 3 mmHg. IAS/Shunts: No atrial level shunt detected by color flow Doppler. LEFT VENTRICLE PLAX 2D LVIDd:         4.50 cm LVIDs:         3.50 cm LV PW:         1.20 cm LV IVS:        1.00 cm LVOT diam:     2.10 cm LV SV:         48 LV SV Index:   26 LVOT Area:     3.46 cm  RIGHT VENTRICLE RVSP:           49.0 mmHg LEFT ATRIUM              Index       RIGHT ATRIUM LA diam:  4.30 cm  2.35 cm/m  RA Pressure: 8.00 mmHg LA Vol (A2C):   107.0 ml 58.52 ml/m LA Vol (A4C):   96.6 ml  52.83 ml/m LA Biplane Vol: 104.0 ml 56.88 ml/m  AORTIC VALVE AV Area (Vmax):    1.37 cm AV Area (Vmean):   1.52 cm AV Area (VTI):     1.38 cm AV Vmax:           214.50 cm/s AV Vmean:          143.250 cm/s AV VTI:            0.349 m AV Peak Grad:      18.4 mmHg AV Mean Grad:      9.8 mmHg LVOT Vmax:         85.00 cm/s LVOT Vmean:        62.800 cm/s LVOT VTI:          0.139 m LVOT/AV VTI ratio: 0.40 AI PHT:            280 msec  AORTA Ao Root diam: 3.50 cm Ao Asc diam:  3.80 cm MR Peak grad:    136.3 mmHg  TRICUSPID VALVE MR Mean grad:    82.6 mmHg   TR Peak grad:   41.0 mmHg MR Vmax:         583.80 cm/s TR Vmax:        320.00 cm/s MR Vmean:        424.2 cm/s  Estimated RAP:  8.00 mmHg MR PISA:          1.57 cm    RVSP:           49.0 mmHg MR PISA Eff ROA: 10 mm MR PISA Radius:  0.50 cm     SHUNTS                              Systemic VTI:  0.14 m                              Systemic Diam: 2.10 cm Skeet Latch MD Electronically signed by Skeet Latch MD Signature Date/Time: 11/21/2019/4:34:50 PM    Final     Cardiac Studies   ECHO  1. Mild global hypokinesis slightly worse in the septum. Left ventricular  ejection fraction, by estimation, is 45 to 50%. The left ventricle has  mildly decreased function. The left ventricle demonstrates global  hypokinesis.  2. Right ventricular systolic function is normal. The right ventricular  size is normal. There is mildly elevated pulmonary artery systolic  pressure.  3. Left atrial size was severely dilated.  4. A small pericardial effusion is present. The pericardial effusion is  posterior to the left ventricle.  5. Severe mitral regurgitation with Coanda effect and pulmonary vein flow  reversal. The mitral valve is normal in structure. Severe mitral valve  regurgitation. No evidence of mitral stenosis.  6. Tricuspid valve regurgitation is moderate.  7. Aortic valve leaflets are restricted. . The aortic valve is normal in  structure. There is moderate calcification of the aortic valve. There is  moderate thickening of the aortic valve. Aortic valve regurgitation is  mild. Mild aortic valve stenosis.  Aortic valve area, by VTI measures 1.38 cm. Aortic valve mean gradient  measures 9.8 mmHg. Aortic valve Vmax measures 2.14 m/s.  8. Aortic dilatation noted. There is mild dilatation of  the ascending  aorta, measuring 38 mm.  9. The inferior vena cava is normal in size with greater than 50%  respiratory variability, suggesting right atrial pressure of 3 mmHg.   Comparison(s): TEE 11/03/19 EF 50%. Severe MR. Moderate pericardial  effusion. TTE 10/22/19 EF 45-50%. Severe MR. Small pericardial effusion.  Patient Profile     86 y.o.  female with end-stage heart failure, severe mitral regurgitation, atrial fibrillation here with acute on chronic decompensated heart failure end-stage.  Assessment & Plan    Acute on chronic decompensated systolic and diastolic heart failure with severe mitral regurgitation -Class IV stage D. Appears end-of-life stage. Multiple readmissions.  -DNR -Palliative care consult once again has been enacted. I wonder if hospice could play a role.  Discussed with her the severity of her illness and underlying disease and that her feel more comfortable. -Requested we not make a change to her ICD. Still has therapies. -Currently on IV furosemide drip. Trying to diurese as best as we can in the setting of her chronic kidney disease stage IV/V. Not a dialysis candidate. -Not a candidate for MitraClip. Has been assessed by structural team.  Persistent atrial fibrillation -Multiple failed attempts at cardioversion. EP assistance at last admission. Unable to maintain sinus rhythm. Continue with amiodarone for rate control as well. Continuing Eliquis 2.5 twice a day.  Acute hypoxic respiratory failure -BiPAP. DNR.  Acute on chronic renal failure-cardiorenal  Diabetes --will change to moderate scale.   CRITICAL CARE Performed by: Candee Furbish   Total critical care time: 40 minutes  Critical care time was exclusive of separately billable procedures and treating other patients.  Critical care was necessary to treat or prevent imminent or life-threatening deterioration.  Critical care was time spent personally by me on the following activities: development of treatment plan with patient and/or surrogate as well as nursing, discussions with consultants, evaluation of patient's response to treatment, examination of patient, obtaining history from patient or surrogate, ordering and performing treatments and interventions, ordering and review of laboratory studies, ordering and review of radiographic studies,  pulse oximetry and re-evaluation of patient's condition.   For questions or updates, please contact [place] Please consult [email or web address] for contact info under        Signed, Candee Furbish, MD  11/23/2019, 8:52 AM

## 2019-11-23 NOTE — Progress Notes (Signed)
Paged Dr. Paticia Stack and he called back. Made MD aware that patient is asking for something to help her sleep and takes ativan at home for anxiety but states that it doesn't help her sleep only helps her with anxiety. Patient also asking for Flonase for runny nose. MD gave order for Flonase, PRN ativan for anxiety and order for PRN melatonin 5 mg for sleep.

## 2019-11-24 DIAGNOSIS — Z66 Do not resuscitate: Secondary | ICD-10-CM | POA: Diagnosis not present

## 2019-11-24 DIAGNOSIS — R0600 Dyspnea, unspecified: Secondary | ICD-10-CM

## 2019-11-24 DIAGNOSIS — I509 Heart failure, unspecified: Secondary | ICD-10-CM

## 2019-11-24 DIAGNOSIS — I5043 Acute on chronic combined systolic (congestive) and diastolic (congestive) heart failure: Secondary | ICD-10-CM | POA: Diagnosis not present

## 2019-11-24 DIAGNOSIS — R570 Cardiogenic shock: Secondary | ICD-10-CM | POA: Diagnosis not present

## 2019-11-24 DIAGNOSIS — Z515 Encounter for palliative care: Secondary | ICD-10-CM

## 2019-11-24 DIAGNOSIS — F411 Generalized anxiety disorder: Secondary | ICD-10-CM

## 2019-11-24 LAB — BASIC METABOLIC PANEL
Anion gap: 12 (ref 5–15)
BUN: 110 mg/dL — ABNORMAL HIGH (ref 8–23)
CO2: 28 mmol/L (ref 22–32)
Calcium: 8.6 mg/dL — ABNORMAL LOW (ref 8.9–10.3)
Chloride: 93 mmol/L — ABNORMAL LOW (ref 98–111)
Creatinine, Ser: 3.51 mg/dL — ABNORMAL HIGH (ref 0.44–1.00)
GFR calc non Af Amer: 11 mL/min — ABNORMAL LOW (ref >60)
Glucose, Bld: 196 mg/dL — ABNORMAL HIGH (ref 70–99)
Potassium: 4.6 mmol/L (ref 3.5–5.1)
Sodium: 133 mmol/L — ABNORMAL LOW (ref 135–145)

## 2019-11-24 LAB — GLUCOSE, CAPILLARY
Glucose-Capillary: 191 mg/dL — ABNORMAL HIGH (ref 70–99)
Glucose-Capillary: 226 mg/dL — ABNORMAL HIGH (ref 70–99)

## 2019-11-24 MED ORDER — HYDROMORPHONE HCL 1 MG/ML IJ SOLN
0.5000 mg | INTRAMUSCULAR | Status: DC | PRN
  Administered 2019-11-25 – 2019-11-26 (×2): 0.5 mg via INTRAVENOUS
  Filled 2019-11-24 (×2): qty 1

## 2019-11-24 MED ORDER — OXYCODONE HCL 5 MG PO TABS
5.0000 mg | ORAL_TABLET | ORAL | Status: DC | PRN
  Administered 2019-11-24 (×2): 5 mg via ORAL
  Filled 2019-11-24 (×2): qty 1

## 2019-11-24 MED ORDER — LORAZEPAM 1 MG PO TABS
1.0000 mg | ORAL_TABLET | Freq: Two times a day (BID) | ORAL | Status: DC
  Administered 2019-11-24 – 2019-11-29 (×10): 1 mg via ORAL
  Filled 2019-11-24 (×11): qty 1

## 2019-11-24 MED ORDER — FUROSEMIDE 80 MG PO TABS
80.0000 mg | ORAL_TABLET | Freq: Two times a day (BID) | ORAL | Status: DC
  Administered 2019-11-24 – 2019-11-29 (×10): 80 mg via ORAL
  Filled 2019-11-24 (×10): qty 1

## 2019-11-24 MED ORDER — LORAZEPAM 2 MG/ML IJ SOLN: 0.5000 mg | Freq: Four times a day (QID) | INTRAMUSCULAR | Status: DC | PRN

## 2019-11-24 MED ORDER — DOUBLE ANTIBIOTIC 500-10000 UNIT/GM EX OINT
TOPICAL_OINTMENT | Freq: Two times a day (BID) | CUTANEOUS | Status: DC
  Filled 2019-11-24 (×2): qty 28.4

## 2019-11-24 MED ORDER — GLYCOPYRROLATE 0.2 MG/ML IJ SOLN
0.2000 mg | INTRAMUSCULAR | Status: DC | PRN
  Administered 2019-11-26: 0.2 mg via INTRAVENOUS
  Filled 2019-11-24: qty 1

## 2019-11-24 NOTE — Plan of Care (Signed)
  Problem: Education: Goal: Knowledge of the prescribed therapeutic regimen will improve Outcome: Adequate for Discharge   Problem: Coping: Goal: Ability to identify and develop effective coping behavior will improve Outcome: Adequate for Discharge   Problem: Clinical Measurements: Goal: Quality of life will improve Outcome: Adequate for Discharge   Problem: Respiratory: Goal: Verbalizations of increased ease of respirations will increase Outcome: Adequate for Discharge   Problem: Role Relationship: Goal: Family's ability to cope with current situation will improve Outcome: Adequate for Discharge Goal: Ability to verbalize concerns, feelings, and thoughts to partner or family member will improve Outcome: Adequate for Discharge   Problem: Pain Management: Goal: Satisfaction with pain management regimen will improve Outcome: Adequate for Discharge

## 2019-11-24 NOTE — Progress Notes (Signed)
Progress Note  Patient Name: Lauren Walker Date of Encounter: 11/24/2019  [facility] Cardiologist: Jenne Campus, MD   Subjective   Mild shortness of breath, no chest pain.   Inpatient Medications    Scheduled Meds: . amiodarone  200 mg Oral BID  . apixaban  2.5 mg Oral BID  . chlorhexidine  15 mL Mouth Rinse BID  . fluticasone  1 spray Each Nare BID  . furosemide  80 mg Oral BID  . LORazepam  1 mg Oral BID  . mouth rinse  15 mL Mouth Rinse q12n4p  . polymixin-bacitracin   Topical BID  . sodium chloride flush  3 mL Intravenous Q12H   Continuous Infusions: . sodium chloride     PRN Meds: sodium chloride, acetaminophen, glycopyrrolate, HYDROmorphone (DILAUDID) injection, LORazepam, melatonin, ondansetron (ZOFRAN) IV, oxyCODONE, sodium chloride flush   Vital Signs    Vitals:   11/24/19 0600 11/24/19 0700 11/24/19 0800 11/24/19 0900  BP: (!) 100/56 108/70 (!) 98/53 116/63  Pulse: 95 100 97 99  Resp: 20 (!) 25 (!) 21 (!) 28  Temp:      TempSrc:      SpO2: 100% 100% 100% 97%  Weight:        Intake/Output Summary (Last 24 hours) at 11/24/2019 0953 Last data filed at 11/24/2019 0900 Gross per 24 hour  Intake 722.81 ml  Output 605 ml  Net 117.81 ml   Last 3 Weights 11/24/2019 11/22/2019 11/21/2019  Weight (lbs) 176 lb 9.4 oz 178 lb 12.7 oz 175 lb 12.8 oz  Weight (kg) 80.1 kg 81.1 kg 79.742 kg      Telemetry    No adverse arrhythmias- Personally Reviewed  ECG    No new- Personally Reviewed  Physical Exam   GEN:  Ill-appearing.   Neck: No JVD Cardiac: RRR, no murmurs, rubs, or gallops.  Respiratory:  Minimal crackles bilaterally. GI: Soft, nontender, non-distended  MS:  1+ edema; No deformity. Neuro:  Nonfocal  Psych: Normal affect   Labs    High Sensitivity Troponin:   Recent Labs  Lab 11/03/19 1115 11/03/19 1400 11/09/19 1413 11/09/19 1818  TROPONINIHS 19* 20* 15 16      Chemistry Recent Labs  Lab 11/22/19 1859 11/22/19 1859  11/22/19 2118 11/23/19 0750 11/24/19 0311  NA 133*   < > 130* 134* 133*  K 5.1   < > 4.9 4.9 4.6  CL 93*  --   --  92* 93*  CO2 26  --   --  27 28  GLUCOSE 412*  --   --  340* 196*  BUN 98*  --   --  104* 110*  CREATININE 3.40*  --   --  3.55* 3.51*  CALCIUM 9.0  --   --  8.7* 8.6*  GFRNONAA 11*  --   --  11* 11*  ANIONGAP 14  --   --  15 12   < > = values in this interval not displayed.     Hematology Recent Labs  Lab 11/22/19 1859 11/22/19 2118  WBC 13.9*  --   RBC 2.97*  --   HGB 8.2* 8.8*  HCT 27.6* 26.0*  MCV 92.9  --   MCH 27.6  --   MCHC 29.7*  --   RDW 16.4*  --   PLT 391  --     BNP Recent Labs  Lab 11/22/19 1859  BNP 1,695.3*     DDimer No results for input(s): DDIMER in the  last 168 hours.   Radiology    DG Chest Port 1 View  Result Date: 11/22/2019 CLINICAL DATA:  Shortness of breath EXAM: PORTABLE CHEST 1 VIEW COMPARISON:  Chest x-ray 11/09/2019, chest x-ray 11/08/2019 FINDINGS: Right chest wall dual lead pacemaker in stable position. The heart size and mediastinal contours are unchanged. Aortic arch calcification. Hazy airspace opacity within bilateral mid to lower lung zones could represent atelectasis versus infection/inflammation. No pulmonary edema. Slight interval increase in size of a at least small right pleural effusion. Slight interval increase in size of an at least small left pleural effusion. No pneumothorax. No acute osseous abnormality. IMPRESSION: Interval increase in size of bilateral small pleural effusions. Underlying infection/inflammation is not excluded. Electronically Signed   By: Iven Finn M.D.   On: 11/22/2019 20:53    Cardiac Studies    ECHO  1. Mild global hypokinesis slightly worse in the septum. Left ventricular  ejection fraction, by estimation, is 45 to 50%. The left ventricle has  mildly decreased function. The left ventricle demonstrates global  hypokinesis.  2. Right ventricular systolic function is  normal. The right ventricular  size is normal. There is mildly elevated pulmonary artery systolic  pressure.  3. Left atrial size was severely dilated.  4. A small pericardial effusion is present. The pericardial effusion is  posterior to the left ventricle.  5. Severe mitral regurgitation with Coanda effect and pulmonary vein flow  reversal. The mitral valve is normal in structure. Severe mitral valve  regurgitation. No evidence of mitral stenosis.  6. Tricuspid valve regurgitation is moderate.  7. Aortic valve leaflets are restricted. . The aortic valve is normal in  structure. There is moderate calcification of the aortic valve. There is  moderate thickening of the aortic valve. Aortic valve regurgitation is  mild. Mild aortic valve stenosis.  Aortic valve area, by VTI measures 1.38 cm. Aortic valve mean gradient  measures 9.8 mmHg. Aortic valve Vmax measures 2.14 m/s.  8. Aortic dilatation noted. There is mild dilatation of the ascending  aorta, measuring 38 mm.  9. The inferior vena cava is normal in size with greater than 50%  respiratory variability, suggesting right atrial pressure of 3 mmHg.   Comparison(s): TEE 11/03/19 EF 50%. Severe MR. Moderate pericardial  effusion. TTE 10/22/19 EF 45-50%. Severe MR. Small pericardial effusion.  Patient Profile     86 y.o. female end-stage heart failure, severe mitral regurgitation, atrial fibrillation here with acute on chronic decompensated heart failure end-stage.  Assessment & Plan    Acute on chronic decompensated systolic and diastolic heart failure with underlying severe mitral gravitation nonoperative candidate -Stage D class IV end-of-life.  Multiple readmissions. -Appreciate palliative care team consultation.  Discussed personally.  We will transition to comfort care.  6 N. room when available.  Hospice house when available.  Daughter and husband in room for discussion. -I will stop IV furosemide drip.  Meager output.  I  will transition her to Lasix p.o. 80 twice a day.  Creatinine 3.5.  IV pole beeping periodically, would like to provide comfort for her and discontinue the IV drip. -Order has been written to deactivate shock therapies on ICD  Persistent atrial fibrillation -On Eliquis.  Low-dose.  Multiple cardioversions in the past.  Diabetes -On moderate insulin sliding scale.  Proceeding with comfort care measures.  Once again appreciate palliative care team.  CRITICAL CARE Performed by: Candee Furbish   Total critical care time: 35 minutes  Critical care time was exclusive of  separately billable procedures and treating other patients.  Critical care was necessary to treat or prevent imminent or life-threatening deterioration.  Critical care was time spent personally by me on the following activities: development of treatment plan with patient and/or surrogate as well as nursing, discussions with consultants, evaluation of patient's response to treatment, examination of patient, obtaining history from patient or surrogate, ordering and performing treatments and interventions, ordering and review of laboratory studies, ordering and review of radiographic studies, pulse oximetry and re-evaluation of patient's condition.      For questions or updates, please contact [place] Please consult [email or web address] for contact info under        Signed, Candee Furbish, MD  11/24/2019, 9:53 AM

## 2019-11-24 NOTE — Consult Note (Signed)
Consultation Note Date: 11/24/2019   Patient Name: Lauren Walker  DOB: [birth year]  MRN: [id number]  Age / Sex: 86 y.o., female  PCP: Midge Minium, MD Referring Physician: Delight Hoh, MD  Reason for Consultation: Establishing goals of care  HPI/Patient Profile: 86 y.o. female  with past medical history of end-stage heart failure, severe mitral regurgitation not a candidate for replacement/clip, CAD, chronic atrial fibrillation, ICD, pericardial effusion, CKD stage 4, ER positive breast cancer admitted on 11/22/2019 with acute decompensated CHF. Recent hospitalization for CHF exacerbation and afib with unsuccessful cardioversions. [name] was recommending palliative consultation prior to hospital admission. Palliative medicine consultation for goals of care/EOL discussions.   Clinical Assessment and Goals of Care:  I have reviewed medical records, discussed with care team, and met with patient, husband, and daughter Nunzio Cory) at bedside to discuss goals of care. Patient's son and DIL on speaker phone  I introduced Palliative Medicine as specialized medical care for people living with serious illness. It focuses on providing relief from the symptoms and stress of a serious illness.  Prior to admission, patient living home with husband. Daughter reports that her health began to decline in April/May 2021 with diagnosis of heart failure, mitral regurgitation, new diagnosis of breast cancer, and underlying DM and CKD. She has poorly progressed at home since recent hospitalization. She cannot sleep flat and with dyspnea at rest. Patient and family confirm that they were recently told she is not a candidate for value replacement/clip and recommendation was for palliative consultation.   Discussed course of hospitalization including diagnoses, interventions, plan of care. Discussed concern that she remains  short of breath in the bed (with no activity) despite aggressive, continuous IV lasix. Discussed end-stage/terminal nature of her heart conditions and worsening kidney function.   I attempted to elicit values and goals of care important to the patient and family. Patient confirms her decision for DNR/DNI code status. Family understands and respects her decision.   The difference between aggressive medical intervention and comfort care was considered in light of the patient's goals of care. Educated on eligibility for hospice services including inpatient hospice facility with poor prognosis, especially when aggressive diuresis is discontinued. Discussed shift to comfort focused pathway including discontinuation of interventions not aimed at comfort and focus on symptom management medications.   Patient and family understand diagnoses and poor prognosis. They are ready for shift to comfort measures and wish to pursue hospice facility placement. Discussed symptom management medications, unrestricted visitor access, comfort feeds.   Discussed deactivation of ICD so patient is not shocked at the end of her life. Patient and family agree with deactivation of ICD as they do not wish to see her suffer.   Discussed transition to 6N for comfort care and transfer to hospice home when bed available. Patient/family agree.   Questions and concerns were addressed. PMT contact information given.   **Discussed in detail with Dr. Marlou Porch and RN.    SUMMARY OF RECOMMENDATIONS    GOC discussion with patient, husband,  daughter, and son on speaker.  Patient confirms DNR/DNI code status.  Patient/family understand diagnoses and poor prognosis.   Patient/family ready for transition to comfort measures, understanding interventions not aimed at comfort will be discontinued.   Symptom management--see below.  ICD deactivation  Comfort feeds per patient/family request.  Unrestricted visitor access  Spiritual  care consult for EOL care.  TOC team referral for residential hospice placement. Transfer to 6N for comfort care if bed available.    Code Status/Advance Care Planning:  DNR/DNI  Symptom Management:   Oxycodone 46m PO q4h prn moderate pain/dyspnea/air hunger/tachypnea  Dilaudid 0.536mIV q3h prn severe pain/dyspnea/air hunger/tachypnea  Scheduled Ativan 35m39mO BID (home dose)  Ativan 0.5mg49m q6h prn breakthrough anxiety  Robinul 0.2mg 48mq4h prn secretions  Dr. SkainMarlou Porch/c continuous lasix infusion and will order scheduled PO lasix for comfort. Patient has foley cath for EOL care.  Palliative Prophylaxis:   Aspiration, Delirium Protocol, Frequent Pain Assessment, Oral Care and Turn Reposition  Additional Recommendations (Limitations, Scope, Preferences):  Full Comfort Care  Psycho-social/Spiritual:   Desire for further Chaplaincy support:yes  Additional Recommendations: Caregiving  Support/Resources, Compassionate Wean Education and Education on Hospice  Prognosis:   Poor long-term: weeks if not days with end-stage heart failure, fluid overload, severe mitral regurgitation not a candidate for replacement, CKD with worsening renal function  Discharge Planning: Hospice facility      Primary Diagnoses: Present on Admission: . Cardiogenic shock (HCC) [place] have reviewed the medical record, interviewed the patient and family, and examined the patient. The following aspects are pertinent.  Past Medical History:  Diagnosis Date  . Acute CHF (congestive heart failure) (HCC) [place]/2021  . Anemia associated with stage 4 chronic renal failure (HCC) [place]/2021  . Aortic stenosis 02/14/2019  . Arthritis   . CAD (coronary artery disease) 08/16/2018  . Chronic kidney disease   . Chronic systolic dysfunction of left ventricle   . CKD stage G4/A1, GFR 15-29 and albumin creatinine ratio <30 mg/g (HCC) 05/17/2019  . Coronary artery disease   . Diabetes mellitus without  complication (HCC) [place] Diverticulitis   . History of MI (myocardial infarction) 08/16/2018  . HTN (hypertension) 08/16/2018  . Hyperlipidemia associated with type 2 diabetes mellitus (HCC) [place]/2020  . Hypertension   . Hypothyroid 08/16/2018  . Hypothyroidism   . ICD (implantable cardioverter-defibrillator) in place 07/06/2019  . Malignant neoplasm of upper-outer quadrant of left breast in female, estrogen receptor positive (HCC) [place]/2021  . Mitral regurgitation moderate to severe based on echocardiogram in summer 2020 11/04/2018  . Myocardial infarction (HCC) [place] Pacemaker 06/29/2019  . Paroxysmal atrial fibrillation (HCC)   . Pleural effusion on left 06/29/2019  . Sinus bradycardia 06/17/2019  . Thyroid disease   . Transaminitis 06/17/2019  . Type 2 diabetes mellitus with other circulatory complications (HCC) [place]9/8/85/0277-tach (HCC)[facility]   Social History   Socioeconomic History  . Marital status: Married    Spouse name: Not on file  . Number of children: Not on file  . Years of education: Not on file  . Highest education level: Not on file  Occupational History  . Not on file  Tobacco Use  . Smoking status: Former SmokeResearch scientist (life sciences)mokeless tobacco: Never Used  Vaping Use  . Vaping Use: Never used  Substance and Sexual Activity  . Alcohol use: Yes    Comment: Very rare  . Drug use: Not Currently  . Sexual activity:  Not Currently  Other Topics Concern  . Not on file  Social History Narrative  . Not on file   Social Determinants of Health   Financial Resource Strain:   . Difficulty of Paying Living Expenses: Not on file  Food Insecurity:   . Worried About Charity fundraiser in the Last Year: Not on file  . Ran Out of Food in the Last Year: Not on file  Transportation Needs:   . Lack of Transportation (Medical): Not on file  . Lack of Transportation (Non-Medical): Not on file  Physical Activity:   . Days of Exercise per Week: Not on file  . Minutes of Exercise per  Session: Not on file  Stress:   . Feeling of Stress : Not on file  Social Connections:   . Frequency of Communication with Friends and Family: Not on file  . Frequency of Social Gatherings with Friends and Family: Not on file  . Attends Religious Services: Not on file  . Active Member of Clubs or Organizations: Not on file  . Attends Archivist Meetings: Not on file  . Marital Status: Not on file   Family History  Problem Relation Age of Onset  . Arthritis Mother   . Heart disease Mother   . Hypertension Mother   . Arthritis Father   . Heart attack Father   . Heart disease Father   . Arthritis Daughter   . Arthritis Son   . Depression Maternal Aunt   . Hyperlipidemia Maternal Aunt   . Hypertension Maternal Aunt    Scheduled Meds: . amiodarone  200 mg Oral BID  . apixaban  2.5 mg Oral BID  . chlorhexidine  15 mL Mouth Rinse BID  . fluticasone  1 spray Each Nare BID  . furosemide  80 mg Oral BID  . LORazepam  1 mg Oral BID  . mouth rinse  15 mL Mouth Rinse q12n4p  . polymixin-bacitracin   Topical BID  . sodium chloride flush  3 mL Intravenous Q12H   Continuous Infusions: . sodium chloride     PRN Meds:.sodium chloride, acetaminophen, glycopyrrolate, HYDROmorphone (DILAUDID) injection, LORazepam, melatonin, ondansetron (ZOFRAN) IV, oxyCODONE, sodium chloride flush Medications Prior to Admission:  Prior to Admission medications   Medication Sig Start Date End Date Taking? Authorizing Provider  amiodarone (PACERONE) 200 MG tablet Take 1 tablet (200 mg total) by mouth 2 (two) times daily. 11/10/19  Yes Goodrich, Callie E, PA-C  anastrozole (ARIMIDEX) 1 MG tablet TAKE 1 TABLET BY MOUTH EVERY DAY Patient taking differently: Take 1 mg by mouth daily.  09/15/19  Yes Magrinat, Virgie Dad, MD  apixaban (ELIQUIS) 2.5 MG TABS tablet Take 1 tablet (2.5 mg total) by mouth 2 (two) times daily. 03/10/19  Yes Loel Dubonnet, NP  atorvastatin (LIPITOR) 40 MG tablet TAKE 1 TABLET BY  MOUTH EVERY DAY Patient taking differently: Take 40 mg by mouth daily.  10/18/19  Yes Midge Minium, MD  b complex vitamins capsule Take 1 capsule by mouth daily.   Yes [provider]  Biotin 1 MG CAPS Take 1 mg by mouth daily.    Yes [provider]  Ferrous Sulfate (IRON) 325 (65 Fe) MG TABS Take 325 mg by mouth daily with breakfast.    Yes [provider]  isosorbide dinitrate (ISORDIL) 20 MG tablet TAKE 2 TABLETS (40MG) BY MOUTH TWICE DAILY Patient taking differently: Take 40 mg by mouth 2 (two) times daily.  11/08/19  Yes Tabori,  Aundra Millet, MD  levothyroxine (SYNTHROID) 175 MCG tablet Take 1 tablet (175 mcg total) by mouth daily before breakfast. 11/17/19  Yes Burtis Junes, NP  LORazepam (ATIVAN) 1 MG tablet Take 1 tablet (1 mg total) by mouth 2 (two) times daily. Patient taking differently: Take 1 mg by mouth 2 (two) times daily as needed for anxiety or sleep.  11/18/19  Yes Midge Minium, MD  metoprolol succinate (TOPROL-XL) 50 MG 24 hr tablet Take 1 tablet (50 mg total) by mouth daily. Take with or immediately following a meal. 11/21/19 11/15/20 Yes Sherren Mocha, MD  torsemide (DEMADEX) 20 MG tablet Take 3 tablets (60 mg total) by mouth daily. Patient taking differently: Take 60-80 mg by mouth See admin instructions. Take 40 mg 2 times a day for 2 days and then 30 mg 2 times daily 11/21/19 11/15/20 Yes Sherren Mocha, MD  triamcinolone ointment (KENALOG) 0.1 % Apply 1 application topically 2 (two) times daily. 11/18/19 11/17/20 Yes Midge Minium, MD  vitamin C (ASCORBIC ACID) 500 MG tablet Take 500 mg by mouth daily.   Yes [provider]  acetaminophen (TYLENOL) 650 MG CR tablet Take 1,300 mg by mouth every 8 (eight) hours as needed for pain.     [provider]  fluticasone (FLONASE) 50 MCG/ACT nasal spray Place 1 spray into both nostrils in the morning and at bedtime.    [provider]  nitroGLYCERIN (NITROSTAT) 0.4  MG SL tablet Place 1 tablet (0.4 mg total) under the tongue every 5 (five) minutes as needed for chest pain. 09/28/19   Midge Minium, MD  potassium chloride SA (KLOR-CON) 20 MEQ tablet Take 1 tablet (20 mEq total) by mouth daily. 11/21/19 11/15/20  Sherren Mocha, MD   Allergies  Allergen Reactions  . Amiodarone Other (See Comments)    Prolonged QT, VT  . Aranesp (Albumin Free) [Darbepoetin Alfa] Other (See Comments)    Patient had cardiac arrest evening after receiving this  . Tape Itching, Rash and Other (See Comments)    Reaction was from the surgical tape from cyst removed  . Latex Itching   Review of Systems  Constitutional: Positive for activity change.  Respiratory: Positive for shortness of breath.    Physical Exam Vitals and nursing note reviewed.  Constitutional:      Appearance: She is ill-appearing.  HENT:     Head: Normocephalic and atraumatic.  Cardiovascular:     Rate and Rhythm: Rhythm irregularly irregular.  Pulmonary:     Effort: Tachypnea and accessory muscle usage present. No respiratory distress.     Comments: Dyspnea at rest Neurological:     Mental Status: She is alert, oriented to person, place, and time and easily aroused.  Psychiatric:        Mood and Affect: Mood normal.        Speech: Speech normal.        Cognition and Memory: Cognition normal.     Vital Signs: BP 101/62   Pulse 98   Temp 97.8 F (36.6 C) (Oral)   Resp (!) 26   Wt 80.1 kg   SpO2 100%   BMI 30.31 kg/m  Pain Scale: 0-10   Pain Score: 0-No pain   SpO2: SpO2: 100 % O2 Device:SpO2: 100 % O2 Flow Rate: .O2 Flow Rate (L/min): 4 L/min  IO: Intake/output summary:   Intake/Output Summary (Last 24 hours) at 11/24/2019 1536 Last data filed at 11/24/2019 0900 Gross per 24 hour  Intake 623.38 ml  Output 485 ml  Net 138.38 ml    LBM: Last BM Date: 11/24/19 Baseline Weight: Weight: 81.1 kg Most recent weight: Weight: 80.1 kg     Palliative Assessment/Data: PPS  30%   Flowsheet Rows     Most Recent Value  Intake Tab  Referral Department Cardiology  Unit at Time of Referral ICU  Palliative Care Primary Diagnosis Cardiac  Palliative Care Type New Palliative care  Reason for referral Clarify Goals of Care  Date first seen by Palliative Care 11/24/19  Clinical Assessment  Palliative Performance Scale Score 30%  Psychosocial & Spiritual Assessment  Palliative Care Outcomes  Patient/Family meeting held? Yes  Who was at the meeting? patient, husband, daughter, son on speaker  Palliative Care Outcomes Clarified goals of care, Improved pain interventions, Improved non-pain symptom therapy, Counseled regarding hospice, Provided end of life care assistance, Provided psychosocial or spiritual support, Changed to focus on comfort, ACP counseling assistance, Transitioned to hospice       Time Total: 23 Greater than 50%  of this time was spent counseling and coordinating care related to the above assessment and plan.  Signed by:  Ihor Dow, DNP, FNP-C Palliative Medicine Team  Phone: [phone] Fax: [phone]   Please contact Palliative Medicine Team phone at [phone] for questions and concerns.  For individual provider: See Shea Evans

## 2019-11-24 NOTE — Progress Notes (Signed)
Lauren Walker is a 86 y.o. female patient admitted. Awake, alert - oriented  X 4 - no acute distress noted.  VSS - Blood pressure (Abnormal) 126/59, pulse 96, temperature 97.6 F (36.4 C), temperature source Oral, resp. rate 18, weight 80.1 kg, SpO2 100 %.    IV in place, occlusive dsg intact without redness.  Orientation to room, and floor completed.  Admission INP armband ID verified with patient/family, and in place.   SR up x 2, fall assessment complete, with patient and family able to verbalize understanding of risk associated with falls, and verbalized understanding to call nsg before up out of bed.  Call light within reach, patient able to voice, and demonstrate understanding.        Will cont to eval and treat per MD orders.  Cyndi Bender, RN 11/24/2019 6:48 PM

## 2019-11-25 DIAGNOSIS — R570 Cardiogenic shock: Secondary | ICD-10-CM | POA: Diagnosis not present

## 2019-11-25 DIAGNOSIS — I5043 Acute on chronic combined systolic (congestive) and diastolic (congestive) heart failure: Secondary | ICD-10-CM | POA: Diagnosis not present

## 2019-11-25 NOTE — Progress Notes (Signed)
Progress Note  Patient Name: Lauren Walker Date of Encounter: 11/25/2019  [facility] Cardiologist: Jenne Campus, MD   Subjective   She states he feels lousy.  Husband at bedside. Fruit tray in front of her.  Inpatient Medications    Scheduled Meds: . amiodarone  200 mg Oral BID  . chlorhexidine  15 mL Mouth Rinse BID  . fluticasone  1 spray Each Nare BID  . furosemide  80 mg Oral BID  . LORazepam  1 mg Oral BID  . mouth rinse  15 mL Mouth Rinse q12n4p  . polymixin-bacitracin   Topical BID  . sodium chloride flush  3 mL Intravenous Q12H   Continuous Infusions: . sodium chloride     PRN Meds: sodium chloride, acetaminophen, glycopyrrolate, HYDROmorphone (DILAUDID) injection, LORazepam, melatonin, ondansetron (ZOFRAN) IV, oxyCODONE, sodium chloride flush   Vital Signs    Vitals:   11/24/19 1300 11/24/19 1400 11/24/19 1820 11/25/19 0433  BP:   (!) 126/59 (!) 96/41  Pulse: 100 98 96 90  Resp: 20 18 18 18   Temp:   97.6 F (36.4 C) 97.8 F (36.6 C)  TempSrc:   Oral Oral  SpO2: 100% 100% 100% 100%  Weight:        Intake/Output Summary (Last 24 hours) at 11/25/2019 0758 Last data filed at 11/25/2019 0438 Gross per 24 hour  Intake 387.35 ml  Output 400 ml  Net -12.65 ml   Last 3 Weights 11/24/2019 11/22/2019 11/21/2019  Weight (lbs) 176 lb 9.4 oz 178 lb 12.7 oz 175 lb 12.8 oz  Weight (kg) 80.1 kg 81.1 kg 79.742 kg       Physical Exam   GEN:  Sitting on edge of bed, tired appearing Neck: No JVD Respiratory:  Minimally increased respiratory effort GI: Soft, nontender, non-distended  MS:  Mild edema; No deformity. Neuro:  Nonfocal  Psych: Normal affect   Labs    High Sensitivity Troponin:   Recent Labs  Lab 11/03/19 1115 11/03/19 1400 11/09/19 1413 11/09/19 1818  TROPONINIHS 19* 20* 15 16      Chemistry Recent Labs  Lab 11/22/19 1859 11/22/19 1859 11/22/19 2118 11/23/19 0750 11/24/19 0311  NA 133*   < > 130* 134* 133*  K 5.1   < > 4.9  4.9 4.6  CL 93*  --   --  92* 93*  CO2 26  --   --  27 28  GLUCOSE 412*  --   --  340* 196*  BUN 98*  --   --  104* 110*  CREATININE 3.40*  --   --  3.55* 3.51*  CALCIUM 9.0  --   --  8.7* 8.6*  GFRNONAA 11*  --   --  11* 11*  ANIONGAP 14  --   --  15 12   < > = values in this interval not displayed.     Hematology Recent Labs  Lab 11/22/19 1859 11/22/19 2118  WBC 13.9*  --   RBC 2.97*  --   HGB 8.2* 8.8*  HCT 27.6* 26.0*  MCV 92.9  --   MCH 27.6  --   MCHC 29.7*  --   RDW 16.4*  --   PLT 391  --     BNP Recent Labs  Lab 11/22/19 1859  BNP 1,695.3*        86 y.o. female with severe mitral regurgitation, end-stage heart failure, atrial fibrillation, awaiting hospice placement  Assessment & Plan    End-stage heart  failure, stage D class IV acute on chronic decompensated systolic and diastolic with underlying severe mitral regurgitation nonoperative -Once again appreciate palliative care's assistance.  Hospice.  Awaiting updates.  Once again expressed comfort measures to her and husband.  Persistent atrial fibrillation -On amiodarone for rate control which will help relieve some symptoms. -Off of Eliquis.  Agree.       For questions or updates, please contact [place] Please consult [email or web address] for contact info under        Signed, Candee Furbish, MD  11/25/2019, 7:58 AM

## 2019-11-25 NOTE — Progress Notes (Signed)
Palliative Medicine RN Note: Symptom check earlier today. Pt able to wake up and deny pain but does report trouble breathing. RN will bring a dose of opioid. I will have PMT weekend coverage follow up tomorrow.  Marjie Skiff Lavere Shinsky, RN, BSN, [facility] Palliative Medicine Team 11/25/2019 4:08 PM Office [phone]

## 2019-11-25 NOTE — Progress Notes (Signed)
[facility] ([facility]) Hospital Liaison note.     Received request from TOC manager for family interest in [facility]. [facility] is unable to offer a room today. Hospital Liaison will follow up tomorrow or sooner if a room becomes available.     A Please do not hesitate to call with questions.     Thank you,    Mary Anne Robertson, RN, CCM       [facility] Hospital Liaison (listed on AMION under Hospice /[facility])     [phone]  

## 2019-11-25 NOTE — Progress Notes (Signed)
Palliative Medicine RN Note:  Secure chat sent to [facility] liaisons to confirm they rec'd referral written yesterday am by Ihor Dow for hospice placement. No response yet, but it is before start of business day, so I don't expect to hear back until later today.  Marjie Skiff Veronica Guerrant, RN, BSN, [facility] Palliative Medicine Team 11/25/2019 7:32 AM Office [phone]

## 2019-11-25 NOTE — Social Work (Signed)
CSW acknowledging consult for comfort care. Will  make referral to [facility]- discharge dependent on stability as well as bed availability.   Alexander Mt, MSW, [place] Work

## 2019-11-26 NOTE — Progress Notes (Signed)
[facility] Hospital Liaison note.  Unfortunately,  [facility] is unable to offer a room today. Hospital Liaison will follow up tomorrow or sooner if a room becomes available.   Please do not hesitate to call with questions.    Thank you for the opportunity to participate in this patient's care.  Chrislyn Lucchese, BSN, RN [facility] Hospital Liaison (listed on AMION under Hospice/[facility])    [phone]    (24h on call) 

## 2019-11-27 DIAGNOSIS — Z515 Encounter for palliative care: Secondary | ICD-10-CM

## 2019-11-27 NOTE — Progress Notes (Signed)
Call if can be of assistance  Otherwise will look in on Monday

## 2019-11-27 NOTE — Progress Notes (Addendum)
Manufacturing engineer [facility])  [facility] does not have any open beds for Ms. Mirkin today.  [facility] will update as soon as one is open.  Lauren Carbon RN, BSN, [facility] Liaison          **visited with pt and spouse at bedside. Pt alert and able to answer simple questions. Somnolent and appears comfortable.  Answered questions about [employer] and provided support.

## 2019-11-28 DIAGNOSIS — I509 Heart failure, unspecified: Secondary | ICD-10-CM | POA: Diagnosis not present

## 2019-11-28 DIAGNOSIS — Z515 Encounter for palliative care: Secondary | ICD-10-CM | POA: Diagnosis not present

## 2019-11-28 MED ORDER — HYDROMORPHONE HCL 1 MG/ML IJ SOLN
0.2500 mg | Freq: Three times a day (TID) | INTRAMUSCULAR | Status: DC
  Administered 2019-11-28 – 2019-11-29 (×3): 0.25 mg via INTRAVENOUS
  Filled 2019-11-28 (×3): qty 1

## 2019-11-28 NOTE — Progress Notes (Signed)
Progress Note  Patient Name: Lauren Walker Date of Encounter: 11/28/2019  Primary Cardiologist: Jenne Campus, MD   Subjective   Feeling okay today. She notes some improvement in her breathing. Asking to be repositioned - floor techs notified to assist. No complaints of chest pain. Updated her that there are no beds available at [facility] today.   Inpatient Medications    Scheduled Meds: . amiodarone  200 mg Oral BID  . chlorhexidine  15 mL Mouth Rinse BID  . fluticasone  1 spray Each Nare BID  . furosemide  80 mg Oral BID  . LORazepam  1 mg Oral BID  . mouth rinse  15 mL Mouth Rinse q12n4p  . polymixin-bacitracin   Topical BID  . sodium chloride flush  3 mL Intravenous Q12H   Continuous Infusions: . sodium chloride     PRN Meds: sodium chloride, acetaminophen, glycopyrrolate, HYDROmorphone (DILAUDID) injection, LORazepam, melatonin, ondansetron (ZOFRAN) IV, oxyCODONE, sodium chloride flush   Vital Signs    Vitals:   11/25/19 0433 11/26/19 0427 11/27/19 0501 11/28/19 0354  BP: (!) 96/41 (!) 102/59 (!) 98/58 122/68  Pulse: 90 99 84 (!) 102  Resp: 18 17 17 16   Temp: 97.8 F (36.6 C) (!) 97.3 F (36.3 C)  97.7 F (36.5 C)  TempSrc: Oral   Oral  SpO2: 100% 100%  100%  Weight:        Intake/Output Summary (Last 24 hours) at 11/28/2019 1142 Last data filed at 11/28/2019 0941 Gross per 24 hour  Intake 120 ml  Output 1000 ml  Net -880 ml   Filed Weights   11/22/19 2320 11/24/19 0500  Weight: 81.1 kg 80.1 kg    Telemetry    N/A - Personally Reviewed  ECG    No new tracings - Personally Reviewed  Physical Exam   GEN: Sitting upright in bed sleeping in no acute distress upon entering the room.   Neck: No JVD, no carotid bruits Cardiac: RRR, no murmurs, rubs, or gallops.  Respiratory: Clear to auscultation bilaterally, no wheezes/ rales/ rhonchi GI: NABS, Soft, nontender, non-distended  MS: 1-2+ LE edema; No deformity. Neuro:  Nonfocal, moving  all extremities spontaneously Psych: Normal affect   Labs    Chemistry Recent Labs  Lab 11/22/19 1859 11/22/19 1859 11/22/19 2118 11/23/19 0750 11/24/19 0311  NA 133*   < > 130* 134* 133*  K 5.1   < > 4.9 4.9 4.6  CL 93*  --   --  92* 93*  CO2 26  --   --  27 28  GLUCOSE 412*  --   --  340* 196*  BUN 98*  --   --  104* 110*  CREATININE 3.40*  --   --  3.55* 3.51*  CALCIUM 9.0  --   --  8.7* 8.6*  GFRNONAA 11*  --   --  11* 11*  ANIONGAP 14  --   --  15 12   < > = values in this interval not displayed.     Hematology Recent Labs  Lab 11/22/19 1859 11/22/19 2118  WBC 13.9*  --   RBC 2.97*  --   HGB 8.2* 8.8*  HCT 27.6* 26.0*  MCV 92.9  --   MCH 27.6  --   MCHC 29.7*  --   RDW 16.4*  --   PLT 391  --     Cardiac EnzymesNo results for input(s): TROPONINI in the last 168 hours. No results for input(s):  TROPIPOC in the last 168 hours.   BNP Recent Labs  Lab 11/22/19 1859  BNP 1,695.3*     DDimer No results for input(s): DDIMER in the last 168 hours.   Radiology    No results found.  Cardiac Studies   ECHO  1. Mild global hypokinesis slightly worse in the septum. Left ventricular  ejection fraction, by estimation, is 45 to 50%. The left ventricle has  mildly decreased function. The left ventricle demonstrates global  hypokinesis.  2. Right ventricular systolic function is normal. The right ventricular  size is normal. There is mildly elevated pulmonary artery systolic  pressure.  3. Left atrial size was severely dilated.  4. A small pericardial effusion is present. The pericardial effusion is  posterior to the left ventricle.  5. Severe mitral regurgitation with Coanda effect and pulmonary vein flow  reversal. The mitral valve is normal in structure. Severe mitral valve  regurgitation. No evidence of mitral stenosis.  6. Tricuspid valve regurgitation is moderate.  7. Aortic valve leaflets are restricted. . The aortic valve is normal in    structure. There is moderate calcification of the aortic valve. There is  moderate thickening of the aortic valve. Aortic valve regurgitation is  mild. Mild aortic valve stenosis.  Aortic valve area, by VTI measures 1.38 cm. Aortic valve mean gradient  measures 9.8 mmHg. Aortic valve Vmax measures 2.14 m/s.  8. Aortic dilatation noted. There is mild dilatation of the ascending  aorta, measuring 38 mm.  9. The inferior vena cava is normal in size with greater than 50%  respiratory variability, suggesting right atrial pressure of 3 mmHg.   Comparison(s): TEE 11/03/19 EF 50%. Severe MR. Moderate pericardial  effusion. TTE 10/22/19 EF 45-50%. Severe MR. Small pericardial effusion.  Patient Profile     86 y.o. female with severe mitral regurgitation, end-stage heart failure, atrial fibrillation, and CKD stage 4-5, who is awaiting hospice placement.  Assessment & Plan    1. Acute on chronic combined CHF with underlying severe mitral regurgitation: unfortunately not a surgical candidate for MVR or mitraclip. She has reached end stage CHF. Palliative care is on board with plans to transition to hospice - awaiting bed at [facility] though none available today. Volume status appears to be net nil in the past 24 hours with very little po intake; net -1.2L this admission if documented accurately. - Appreciate palliative care assistance - Continue lasix 80mg  BID - Continue prn dilaudid for air hunger  2. Persistent atrial fibrillation: rates are stable - Continue amiodarone   Await bed for hospice care at [facility]   For questions or updates, please contact [place] Please consult [email or web address] for contact info under Cardiology/STEMI.      Signed, Abigail Butts, PA-C  11/28/2019, 11:42 AM   [phone]

## 2019-11-28 NOTE — TOC Progression Note (Signed)
Transition of Care [facility]) - Progression Note    Patient Details  Name: Lauren Walker MRN: [id number] Date of Birth: [birth year]  Transition of Care [facility]) CM/SW [place], [place] Phone Number: 11/28/2019, 11:28 AM  Clinical Narrative:     Audrea Muscat with hospice called CSW and explained no beds available at [facility] or [facility]       Expected Discharge Plan and Services                                                 Social Determinants of Health (SDOH) Interventions    Readmission Risk Interventions Readmission Risk Prevention Plan 11/10/2019 05/31/2019  Transportation Screening Complete Complete  PCP or Specialist Appt within 3-5 Days - Complete  [place] or [place] - Complete  Social Work Consult for [place] Planning/Counseling - Complete  Palliative Care Screening - Not Applicable  Medication Review [employer]) Complete Complete  PCP or Specialist appointment within 3-5 days of discharge Complete -  [place] or [place] Complete -  SW Recovery Care/Counseling Consult Complete -  Palliative Care Screening Not Applicable -  [place] Not Applicable -

## 2019-11-28 NOTE — Progress Notes (Deleted)
[facility] ([facility])  There are no beds at [facility] today.  Will update family and hospital once a bed is open.  Venia Carbon RN, BSN, [place] [facility] Liaison

## 2019-11-28 NOTE — Plan of Care (Signed)

## 2019-11-28 NOTE — Progress Notes (Addendum)
Daily Progress Note   Patient Name: Lauren Walker       Date: 11/28/2019 DOB: 1929-06-21  Age: 86 y.o. MRN#: [id number] Attending Physician: Skeet Latch, MD Primary Care Physician: Midge Minium, MD Admit Date: 11/22/2019  Reason for Consultation/Follow-up: symptom management  Subjective: Patient indicates that she has no discomfort.  She is SOB and does not speak with me using words.  Her breathing appears labored. She has eaten a small amount of a muffin otherwise her breakfast tray is untouched.  Per RN Tech the patient indicates when she wants to be pulled up in bed.  Discussed symptoms and management with Lauren Walker.  Also Walker is willing to go to [place] in [place] or [place] [employer] [place].  Assessment: 86 yo female nearing EOL due to end stage heart failure.   Lower ext have 3+ edema and her breathing is labored.   Patient Profile/HPI:  86 y.o. female  with past medical history of end-stage heart failure, severe mitral regurgitation not a candidate for replacement/clip, CAD, chronic atrial fibrillation, ICD, pericardial effusion, CKD stage 4, ER positive breast cancer admitted on 11/22/2019 with acute decompensated CHF. Recent hospitalization for CHF exacerbation and afib with unsuccessful cardioversions. [name] was recommending palliative consultation prior to hospital admission. Palliative medicine consultation for goals of care/EOL discussions.   Length of Stay: 6   Vital Signs: BP 122/68   Pulse (!) 102   Temp 97.7 F (36.5 C) (Oral)   Resp 16   Wt 80.1 kg   SpO2 100%   BMI 30.31 kg/m  SpO2: SpO2: 100 % O2 Device: O2 Device: Nasal Cannula O2 Flow Rate: O2 Flow Rate (L/min): 4 L/min       Palliative Assessment/Data: 20%     Palliative Care  Plan    Recommendations/Plan:  Will DC cbg checks.  Change to regular diet.  Eliminate some checks that are no longer beneficial  Patient's breathing is labored and her pulse is rapid.  Will schedule very low dose PRN dilaudid (creatinine very elevated).  Agree with continuation of diuretics and rate control medication.  Walker open to [place] facility in [place] where ever bed is available.  Code Status:  DNR  Prognosis:   Hours - Days   Discharge Planning:  Hospice  facility  Care plan discussed with Walker.  Thank you for allowing the Palliative Medicine Team to assist in the care of this patient.  Total time spent:  25 min.     Greater than 50%  of this time was spent counseling and coordinating care related to the above assessment and plan.  Lauren Jenny, PA-C Palliative Medicine  Please contact Palliative MedicineTeam phone at [phone] for questions and concerns between 7 am - 7 pm.   Please see AMION for individual provider pager numbers.

## 2019-11-28 NOTE — Progress Notes (Signed)
[facility] ([facility]) Hospital Liaison note.     Received request from TOC manager for family interest in [facility]. [facility] is unable to offer a room today. Hospital Liaison will follow up tomorrow or sooner if a room becomes available.     A Please do not hesitate to call with questions.     Thank you,    Mary Anne Robertson, RN, CCM       [facility] Hospital Liaison (listed on AMION under Hospice /[facility])     [phone]  

## 2019-11-29 DIAGNOSIS — Z515 Encounter for palliative care: Secondary | ICD-10-CM | POA: Diagnosis not present

## 2019-11-29 DIAGNOSIS — I340 Nonrheumatic mitral (valve) insufficiency: Secondary | ICD-10-CM

## 2019-11-29 DIAGNOSIS — R570 Cardiogenic shock: Secondary | ICD-10-CM | POA: Diagnosis not present

## 2019-11-29 MED ORDER — DOUBLE ANTIBIOTIC 500-10000 UNIT/GM EX OINT: 1.0000 "application " | TOPICAL_OINTMENT | Freq: Two times a day (BID) | CUTANEOUS | Status: AC

## 2019-11-29 MED ORDER — HYDROMORPHONE HCL 1 MG/ML IJ SOLN
0.5000 mg | INTRAMUSCULAR | 0 refills | Status: AC | PRN
Start: 2019-11-29 — End: ?

## 2019-11-29 MED ORDER — GLYCOPYRROLATE 0.2 MG/ML IJ SOLN: 0.2000 mg | INTRAMUSCULAR | Status: AC | PRN

## 2019-11-29 MED ORDER — FUROSEMIDE 80 MG PO TABS: 80.0000 mg | ORAL_TABLET | Freq: Two times a day (BID) | ORAL | Status: AC

## 2019-11-29 MED ORDER — HYDROMORPHONE HCL 1 MG/ML IJ SOLN
0.2500 mg | Freq: Three times a day (TID) | INTRAMUSCULAR | 0 refills | Status: AC
Start: 2019-11-29 — End: ?

## 2019-11-29 MED ORDER — LORAZEPAM 2 MG/ML IJ SOLN: 0.5000 mg | Freq: Four times a day (QID) | INTRAMUSCULAR | 0 refills | Status: AC | PRN

## 2019-11-29 MED ORDER — OXYCODONE HCL 5 MG PO TABS
5.0000 mg | ORAL_TABLET | ORAL | 0 refills | Status: AC | PRN
Start: 2019-11-29 — End: ?

## 2019-11-29 MED ORDER — MELATONIN 5 MG PO TABS: 5.0000 mg | ORAL_TABLET | Freq: Every evening | ORAL | 0 refills | Status: AC | PRN

## 2019-11-29 MED ORDER — ONDANSETRON HCL 4 MG/2ML IJ SOLN: 4.0000 mg | Freq: Four times a day (QID) | INTRAMUSCULAR | 0 refills | Status: AC | PRN

## 2019-11-29 NOTE — Progress Notes (Addendum)
Progress Note  Patient Name: Lauren Walker Date of Encounter: 11/29/2019  Primary Cardiologist: Jenne Campus, MD   Subjective   Reports feeling fine this morning. She wants to get out of the hospital to see her dog Barnabas Lister. States breathing is stable. No complaints of pain. Resting comfortably when I left the room.  Inpatient Medications    Scheduled Meds: . amiodarone  200 mg Oral BID  . chlorhexidine  15 mL Mouth Rinse BID  . fluticasone  1 spray Each Nare BID  . furosemide  80 mg Oral BID  .  HYDROmorphone (DILAUDID) injection  0.25 mg Intravenous Q8H  . LORazepam  1 mg Oral BID  . mouth rinse  15 mL Mouth Rinse q12n4p  . polymixin-bacitracin   Topical BID  . sodium chloride flush  3 mL Intravenous Q12H   Continuous Infusions: . sodium chloride     PRN Meds: sodium chloride, acetaminophen, glycopyrrolate, HYDROmorphone (DILAUDID) injection, LORazepam, melatonin, ondansetron (ZOFRAN) IV, oxyCODONE, sodium chloride flush   Vital Signs    Vitals:   11/26/19 0427 11/27/19 0501 11/28/19 0354 11/29/19 0458  BP: (!) 102/59 (!) 98/58 122/68 (!) 109/58  Pulse: 99 84 (!) 102 (!) 101  Resp: 17 17 16 18   Temp: (!) 97.3 F (36.3 C)  97.7 F (36.5 C) 98 F (36.7 C)  TempSrc:   Oral Oral  SpO2: 100%  100% 97%  Weight:        Intake/Output Summary (Last 24 hours) at 11/29/2019 0849 Last data filed at 11/29/2019 0500 Gross per 24 hour  Intake 495 ml  Output 1400 ml  Net -905 ml   Filed Weights   11/22/19 2320 11/24/19 0500  Weight: 81.1 kg 80.1 kg    Telemetry    N/a - Personally Reviewed  ECG    No new tracings - Personally Reviewed  Physical Exam   GEN: No acute distress.   Neck: No JVD, no carotid bruits Cardiac: RRR, +murmur, no rubs or gallops.  Respiratory: Clear to auscultation bilaterally, no wheezes/ rales/ rhonchi GI: NABS, Soft, nontender, non-distended  MS: 3+ LE edema; No deformity. Neuro:  Nonfocal, moving all extremities  spontaneously Psych: Normal affect   Labs    Chemistry Recent Labs  Lab 11/22/19 1859 11/22/19 1859 11/22/19 2118 11/23/19 0750 11/24/19 0311  NA 133*   < > 130* 134* 133*  K 5.1   < > 4.9 4.9 4.6  CL 93*  --   --  92* 93*  CO2 26  --   --  27 28  GLUCOSE 412*  --   --  340* 196*  BUN 98*  --   --  104* 110*  CREATININE 3.40*  --   --  3.55* 3.51*  CALCIUM 9.0  --   --  8.7* 8.6*  GFRNONAA 11*  --   --  11* 11*  ANIONGAP 14  --   --  15 12   < > = values in this interval not displayed.     Hematology Recent Labs  Lab 11/22/19 1859 11/22/19 2118  WBC 13.9*  --   RBC 2.97*  --   HGB 8.2* 8.8*  HCT 27.6* 26.0*  MCV 92.9  --   MCH 27.6  --   MCHC 29.7*  --   RDW 16.4*  --   PLT 391  --     Cardiac EnzymesNo results for input(s): TROPONINI in the last 168 hours. No results for input(s): TROPIPOC in the  last 168 hours.   BNP Recent Labs  Lab 11/22/19 1859  BNP 1,695.3*     DDimer No results for input(s): DDIMER in the last 168 hours.   Radiology    No results found.  Cardiac Studies   Echocardiogram 11/21/19: 1. Mild global hypokinesis slightly worse in the septum. Left ventricular  ejection fraction, by estimation, is 45 to 50%. The left ventricle has  mildly decreased function. The left ventricle demonstrates global  hypokinesis.  2. Right ventricular systolic function is normal. The right ventricular  size is normal. There is mildly elevated pulmonary artery systolic  pressure.  3. Left atrial size was severely dilated.  4. A small pericardial effusion is present. The pericardial effusion is  posterior to the left ventricle.  5. Severe mitral regurgitation with Coanda effect and pulmonary vein flow  reversal. The mitral valve is normal in structure. Severe mitral valve  regurgitation. No evidence of mitral stenosis.  6. Tricuspid valve regurgitation is moderate.  7. Aortic valve leaflets are restricted. . The aortic valve is normal in   structure. There is moderate calcification of the aortic valve. There is  moderate thickening of the aortic valve. Aortic valve regurgitation is  mild. Mild aortic valve stenosis.  Aortic valve area, by VTI measures 1.38 cm. Aortic valve mean gradient  measures 9.8 mmHg. Aortic valve Vmax measures 2.14 m/s.  8. Aortic dilatation noted. There is mild dilatation of the ascending  aorta, measuring 38 mm.  9. The inferior vena cava is normal in size with greater than 50%  respiratory variability, suggesting right atrial pressure of 3 mmHg.   Comparison(s): TEE 11/03/19 EF 50%. Severe MR. Moderate pericardial  effusion. TTE 10/22/19 EF 45-50%. Severe MR. Small pericardial effusion.  Patient Profile     86 y.o.femalewith chronic combined CHF, severe mitral regurgitation, end-stage heart failure, atrial fibrillation, VT s/p ICD, and CKD stage 4-5, who is awaiting hospice placement.  Assessment & Plan    1. Acute on chronic combined CHF with underlying severe mitral regurgitation: unfortunately not a surgical candidate for MVR or mitraclip. She has reached end stage CHF. Palliative care is on board with plans to transition to hospice - awaiting bed at [facility] though none available today. Volume status is net -969mL in the past 24 hours; net -2.1L this admission though there are unmeasured output occurrences. She was started on standing dilaudid yesterday q8h for tachycardia and tachypnea - Appreciate palliative care assistance - Continue lasix 80mg  BID - Continue prn dilaudid for air hunger  2. Persistent atrial fibrillation: rates are stable - Continue amiodarone   3. DM type 2: finger sticks and ISS have been discontinued to minimize discomfort  4. VT s/p ICD: s/p ICD placement 06/2019 after experiencing a syncopal episode and was found to have VT. Decision made to deactivate ICD this admission in pursuit of hospice care  Await bed for hospice care at [facility] or [place]  in [facility]  For questions or updates, please contact [place] Please consult [email or web address] for contact info under Cardiology/STEMI.      Signed, Abigail Butts, PA-C  11/29/2019, 8:49 AM   [phone]

## 2019-11-29 NOTE — Discharge Summary (Signed)
Discharge Summary    Patient ID: Lauren Walker,  MRN: [id number], DOB/AGE: 84/28/1931 84 y.o.  Admit date: 11/22/2019 Discharge date: 11/29/2019  Primary Care Provider: Midge Minium Primary Cardiologist: Jenne Campus, MD  Discharge Diagnoses    Active Problems:   Anxiety state   Cardiogenic shock [facility])   Decompensated heart failure [facility])   Palliative care by specialist   Terminal care   DNR (do not resuscitate)   Dyspnea   Allergies Allergies  Allergen Reactions  . Amiodarone Other (See Comments)    Prolonged QT, VT  . Aranesp (Albumin Free) [Darbepoetin Alfa] Other (See Comments)    Patient had cardiac arrest evening after receiving this  . Tape Itching, Rash and Other (See Comments)    Reaction was from the surgical tape from cyst removed  . Latex Itching    Diagnostic Studies/Procedures    Echocardiogram 11/21/19: 1. Mild global hypokinesis slightly worse in the septum. Left ventricular  ejection fraction, by estimation, is 45 to 50%. The left ventricle has  mildly decreased function. The left ventricle demonstrates global  hypokinesis.  2. Right ventricular systolic function is normal. The right ventricular  size is normal. There is mildly elevated pulmonary artery systolic  pressure.  3. Left atrial size was severely dilated.  4. A small pericardial effusion is present. The pericardial effusion is  posterior to the left ventricle.  5. Severe mitral regurgitation with Coanda effect and pulmonary vein flow  reversal. The mitral valve is normal in structure. Severe mitral valve  regurgitation. No evidence of mitral stenosis.  6. Tricuspid valve regurgitation is moderate.  7. Aortic valve leaflets are restricted. . The aortic valve is normal in  structure. There is moderate calcification of the aortic valve. There is  moderate thickening of the aortic valve. Aortic valve regurgitation is  mild. Mild aortic valve stenosis.  Aortic valve  area, by VTI measures 1.38 cm. Aortic valve mean gradient  measures 9.8 mmHg. Aortic valve Vmax measures 2.14 m/s.  8. Aortic dilatation noted. There is mild dilatation of the ascending  aorta, measuring 38 mm.  9. The inferior vena cava is normal in size with greater than 50%  respiratory variability, suggesting right atrial pressure of 3 mmHg.   Comparison(s): TEE 11/03/19 EF 50%. Severe MR. Moderate pericardial  effusion. TTE 10/22/19 EF 45-50%. Severe MR. Small pericardial effusion.  _____________   History of Present Illness     86 year old female with severe mitral regurgitation, CAD with CTO RCA, chronic atrial fibrillation, long-QT dependent VT s/p ICD, and pericardial effusion presenting with acute decompensated heart failure.  She was discharged 4/48/18 after a complicated admission for decompensated heart failure and atrial fibrillation refractory to cardioversion.  She reports that since discharge from the hospital she has continued to have difficulty and is has been largely wheelchair bound.  She has NHYA IV heart failure symptoms, is unable to lie flat, and has been having worsening swelling and fluid retention.  She denies chest pain.   She had worsening shortness of breath to the point of respiratory distress and tripoding and was placed on continuous NIPPV in the ED.  Goals of care discussion had with ED provider and Dr. Clide Cliff (cardiology fellow).  Offered intensive diuretic therapy to try to offer her some relief which she would like to pursue at this time.  Warned her that, given progressive renal failure, she might require ultrafiltration to become compensated.  She would like to defer this  decision for now.  She did confirm that she would not want ACLS including intubation or chest compressions in the event of cardiopulmonary collapse.  She was reminded that she has an active ICD in place, but requested that it not be deactivate it at the time of admission.Marland Kitchen  She was last  admitted September 3 to for acute decompensated heart failure presenting as an outpatient atrial fibrillation RVR.  At that admission she underwent cardioversion 3 times in the emergency department was unsuccessful.  She was subsequently started on IV diltiazem infusion continued paroxysms of atrial fibrillation.  She has been taken off of amiodarone prior given some concern for QT prolongation dependent VT but this was resumed during her prior admission.  She then underwent a repeat cardioversion but unfortunately had recurrence of her atrial fibrillation and is in atrial fibrillation today.  She underwent a thoracentesis on September 21.  Was discharged on torsemide 40 mg twice a day.  Is been following with [name] for possible MitraClip, but per chart review it appears that she and her family are opting for palliative approach at this point.  In the emergency department, she was in atrial fibrillation with a heart rate of 85, blood pressure 135/88, oxygen saturation of 95% on continuous BiPAP.  Labs significant for white blood cell count of 13.9 thousand, hemoglobin 8.2, platelet 391.  BNP elevated 17,000.  GFR 11.  Glucose 412.  SARS-CoV-2 negative.  Chest x-ray personally reviewed with moderate bilateral pleural effusions, right-sided dual-chamber ICD, and some element of pulmonary venous congestion.  EKG personally reviewed shows atrial fibrillation with RV pacing pattern which replaces prior native conduction with frequent PVCs on her last EKG.  Hospital Course     Consultants: Palliative care   1. Acute on chronic combined CHF with underlying severe mitral regurgitation: Echo this admission showed EF 45-50%, global hypokinesis, severe LAE, severe MR, moderate TR, and mild AI/AS. Unfortunately not a surgical candidate for MVR or mitraclip. She has reached end stage CHF. Palliative care was consulted and patient/family made decision to pursue hospice care. She was started on standing dilaudid  and ativan for air hunger - Appreciate palliative care assistance - will transition to hospice care at this time. - Continue lasix 80mg  BID - Continue prn dilaudid for air hunger  2. Persistent atrial fibrillation:rates are stable. Eliquis discontinued this admission - Continue amiodarone  3. DM type 2: finger sticks and ISS were discontinued to minimize discomfort  4. VT s/p ICD: s/p ICD placement 06/2019 after experiencing a syncopal episode and was found to have VT. Decision made to deactivate ICD this admission in pursuit of hospice care  5. CAD with CTO to RCA: no anginal complaints this admission. Home imdur, statin, and metoprolol were discontinued.   6. Hypothyroidism: home levothyroxine discontinued this admission  _____________  Discharge Vitals Blood pressure (!) 109/58, pulse (!) 101, temperature 98 F (36.7 C), temperature source Oral, resp. rate 18, weight 80.1 kg, SpO2 97 %.  Filed Weights   11/22/19 2320 11/24/19 0500  Weight: 81.1 kg 80.1 kg    Labs & Radiologic Studies    CBC No results for input(s): WBC, NEUTROABS, HGB, HCT, MCV, PLT in the last 72 hours. Basic Metabolic Panel No results for input(s): NA, K, CL, CO2, GLUCOSE, BUN, CREATININE, CALCIUM, MG, PHOS in the last 72 hours. Liver Function Tests No results for input(s): AST, ALT, ALKPHOS, BILITOT, PROT, ALBUMIN in the last 72 hours. No results for input(s): LIPASE, AMYLASE in the  last 72 hours. Cardiac Enzymes No results for input(s): CKTOTAL, CKMB, CKMBINDEX, TROPONINI in the last 72 hours. BNP Invalid input(s): POCBNP D-Dimer No results for input(s): DDIMER in the last 72 hours. Hemoglobin A1C No results for input(s): HGBA1C in the last 72 hours. Fasting Lipid Panel No results for input(s): CHOL, HDL, LDLCALC, TRIG, CHOLHDL, LDLDIRECT in the last 72 hours. Thyroid Function Tests No results for input(s): TSH, T4TOTAL, T3FREE, THYROIDAB in the last 72 hours.  Invalid input(s):  FREET3 _____________  DG Chest 1 View  Result Date: 11/08/2019 CLINICAL DATA:  Left-sided thoracentesis EXAM: CHEST  1 VIEW COMPARISON:  Earlier today FINDINGS: No residual left pleural fluid is seen. No re-expansion edema or visible pneumothorax. Stable hazy density at the right base partially obscured by pacer generator pack. Cardiopericardial enlargement with ICD/pacer leads from the right. IMPRESSION: No evidence of complication or residual fluid after left thoracentesis. Electronically Signed   By: Monte Fantasia M.D.   On: 11/08/2019 11:49   DG Chest 2 View  Result Date: 11/03/2019 CLINICAL DATA:  Shortness of breath. EXAM: CHEST - 2 VIEW COMPARISON:  October 20, 2019. FINDINGS: Stable cardiomegaly. Right-sided pacemaker is unchanged in position. No pneumothorax is noted. Right lung is clear. Moderate left pleural effusion is noted with probable underlying atelectasis or infiltrate. Bony thorax is unremarkable. IMPRESSION: Moderate left pleural effusion with probable underlying atelectasis or infiltrate. Aortic Atherosclerosis (ICD10-I70.0). Electronically Signed   By: Marijo Conception M.D.   On: 11/03/2019 11:40   DG Chest Port 1 View  Result Date: 11/22/2019 CLINICAL DATA:  Shortness of breath EXAM: PORTABLE CHEST 1 VIEW COMPARISON:  Chest x-ray 11/09/2019, chest x-ray 11/08/2019 FINDINGS: Right chest wall dual lead pacemaker in stable position. The heart size and mediastinal contours are unchanged. Aortic arch calcification. Hazy airspace opacity within bilateral mid to lower lung zones could represent atelectasis versus infection/inflammation. No pulmonary edema. Slight interval increase in size of a at least small right pleural effusion. Slight interval increase in size of an at least small left pleural effusion. No pneumothorax. No acute osseous abnormality. IMPRESSION: Interval increase in size of bilateral small pleural effusions. Underlying infection/inflammation is not excluded.  Electronically Signed   By: Iven Finn M.D.   On: 11/22/2019 20:53   DG Chest Port 1 View  Result Date: 11/09/2019 CLINICAL DATA:  Shortness of breath. EXAM: PORTABLE CHEST 1 VIEW COMPARISON:  November 08, 2019 FINDINGS: A dual lead AICD is noted. Mild atelectasis and/or infiltrate is seen within the left lung base. There is no evidence of a pleural effusion or pneumothorax. The cardiac silhouette is markedly enlarged and unchanged in size. There is moderate severity calcification of the aortic arch. Degenerative changes seen throughout the thoracic spine. IMPRESSION: Mild left basilar atelectasis and/or infiltrate. Electronically Signed   By: Virgina Norfolk M.D.   On: 11/09/2019 17:38   DG CHEST PORT 1 VIEW  Result Date: 11/08/2019 CLINICAL DATA:  Shortness of breath with pleural effusion EXAM: PORTABLE CHEST 1 VIEW COMPARISON:  November 05, 2019 FINDINGS: Moderate left pleural effusion noted with atelectasis and consolidation in the left lower lobe region. Note that a portion of the right base is obscured by pacemaker device. There is mild medial right base atelectasis. Right lung otherwise clear. Heart is enlarged with pulmonary vascularity normal, stable. Pacemaker leads attached to right atrium and right ventricle. No adenopathy. No bone lesions. IMPRESSION: Moderate left pleural effusion with atelectasis and probable consolidation left base. Mild atelectasis medial right base. Note that  a portion of the right base is obscured by pacemaker device. Stable cardiomegaly. Pacemaker leads attached to right atrium right ventricle. Electronically Signed   By: Lowella Grip III M.D.   On: 11/08/2019 08:07   DG CHEST PORT 1 VIEW  Result Date: 11/05/2019 CLINICAL DATA:  Dyspnea on exertion. EXAM: PORTABLE CHEST 1 VIEW COMPARISON:  11/03/2019 FINDINGS: Right-sided pacemaker unchanged. Slight interval worsening moderate left base opacification likely moderate size effusion with atelectasis.  Suggestion of small right pleural effusion with atelectasis. Stable cardiomegaly. Remainder the exam is unchanged. IMPRESSION: 1. Slight interval worsening moderate left base opacification likely moderate size effusion with atelectasis. Suggestion of small right effusion with atelectasis. 2. Stable cardiomegaly. Electronically Signed   By: Marin Olp M.D.   On: 11/05/2019 15:19   ECHO TEE  Result Date: 11/03/2019    TRANSESOPHOGEAL ECHO REPORT   Patient Name:   Frutoso Schatz Date of Exam: 11/02/2019 Medical Rec #:  [id number]      Height:       64.0 in Accession #:    [id number]     Weight:       167.0 lb Date of Birth:  September 29, 1929      BSA:          1.812 m Patient Age:    84 years       BP:           128/89 mmHg Patient Gender: F              HR:           105 bpm. Exam Location:  Inpatient Procedure: Transesophageal Echo, 3D Echo, Color Doppler and Cardiac Doppler                               MODIFIED REPORT: This report was modified by Cherlynn Kaiser MD on 11/03/2019 due to revision.  Indications:     Mitral Regurgitation  History:         Patient has prior history of Echocardiogram examinations, most                  recent 10/22/2019. CHF, Previous Myocardial Infarction and CAD,                  Pacemaker, Arrythmias:Atrial Fibrillation; Risk                  Factors:Diabetes and Hypertension.  Sonographer:     Mikki Santee RDCS (AE) Referring Phys:  [id number] RHONDA G BARRETT Diagnosing Phys: Cherlynn Kaiser MD PROCEDURE: After discussion of the risks and benefits of a TEE, an informed consent was obtained from the patient. TEE procedure time was 31 minutes. The transesophogeal probe was passed without difficulty through the esophogus of the patient. Imaged were obtained with the patient in a left lateral decubitus position. Local oropharyngeal anesthetic was provided with Cetacaine. Sedation performed by different physician. The patient was monitored while under deep sedation. Anesthestetic sedation was  provided intravenously by Anesthesiology: 179.71mg  of Propofol, 100mg  of Lidocaine. Image quality was good. The patient's vital signs; including heart rate, blood pressure, and oxygen saturation; remained stable throughout the procedure. The patient developed no complications during the procedure. IMPRESSIONS  1. Left ventricular ejection fraction, by estimation, is 50%. The left ventricle has low normal function.  2. Right ventricular systolic function is mildly reduced. The right ventricular size is normal.  3. Left atrial size was  severely dilated. No left atrial/left atrial appendage thrombus was detected. The LAA emptying velocity was 37 cm/s.  4. Right atrial size was severely dilated.  5. Moderate pericardial effusion.  6. Prolapse of A2 scallop with severe, eccentric mitral valve regurgitation. Multiple jets. Dominant jet arises at A2 P2 scallop. Dominant jet ERO 0.18, RV 24 mL.     Transcatheter Edge to Edge repair measurements:     PMVL length 9 mm     Flail gap 5 mm     Flail width 5 mm     Coaptation depth 8 mm     Coaptation length 3.4 mm     No significant leaflet calcifications. The mitral valve is degenerative. Findings suggest Carpentier type II primary mitral valve regurgitation.  7. Tricuspid valve regurgitation is mild to moderate.  8. The aortic valve is calcified. There is moderate calcification of the aortic valve. Aortic valve regurgitation is trivial.  9. There is Moderate (Grade III) atheroma plaque involving the transverse and descending aorta. 10. Agitated saline contrast bubble study was negative, with no evidence of any interatrial shunt. FINDINGS  Left Ventricle: Left ventricular ejection fraction, by estimation, is 50%. The left ventricle has low normal function. The left ventricular internal cavity size was normal in size. Right Ventricle: The right ventricular size is normal. No increase in right ventricular wall thickness. Right ventricular systolic function is mildly reduced. Left  Atrium: Left atrial size was severely dilated. No left atrial/left atrial appendage thrombus was detected. The LAA emptying velocity was 37 cm/s. Right Atrium: Right atrial size was severely dilated. Pericardium: A moderately sized pericardial effusion is present. Mitral Valve: Prolapse of A2 scallop with severe, eccentric mitral valve regurgitation. Multiple jets. Dominant jet arises at A2 P2 scallop. Dominant jet ERO 0.18, RV 24 mL. Transcatheter Edge to Edge repair measurements: PMVL length 9 mm Flail gap 5 mm Flail width 5 mm Coaptation depth 8 mm Coaptation length 3.4 mm No significant leaflet calcifications. The mitral valve is degenerative in appearance. Findings suggest Carpentier type II primary mitral valve regurgitation. The mean mitral valve gradient is 2.4 mmHg with average heart rate of 95 bpm. Tricuspid Valve: The tricuspid valve is normal in structure. Tricuspid valve regurgitation is mild to moderate. Aortic Valve: The aortic valve is calcified. There is moderate calcification of the aortic valve. Aortic valve regurgitation is trivial. Pulmonic Valve: The pulmonic valve was normal in structure. Pulmonic valve regurgitation is trivial. Aorta: The aortic root and ascending aorta are structurally normal, with no evidence of dilitation. There is moderate (Grade III) atheroma plaque involving the transverse and descending aorta. IAS/Shunts: No atrial level shunt detected by color flow Doppler. Agitated saline contrast was given intravenously to evaluate for intracardiac shunting. Agitated saline contrast bubble study was negative, with no evidence of any interatrial shunt. Additional Comments: A pacer wire is visualized in the right atrium and right ventricle.  MITRAL VALVE MV Mean grad: 2.4 mmHg MR Peak grad:   85.4 mmHg MR Mean grad:   57.0 mmHg MR Vmax:        462.00 cm/s MR Vmean:       360.0 cm/s MR PISA:        2.26 cm MR PISA Radius: 0.60 cm Cherlynn Kaiser MD Electronically signed by Cherlynn Kaiser MD Signature Date/Time: 11/03/2019/3:11:56 PM    Final (Updated)    CUP PACEART REMOTE DEVICE CHECK  Result Date: 11/03/2019 Scheduled remote reviewed. Normal device function.  Persistent AF with RVR, on OAC, there  is not good ventricular rate control as heart rates are 110-140 bpm.  Sent to triage. Next remote 91 days. Kathy Breach, RN, CCDS, [employer] Scheduled remote reviewed. Normal device function.  Persistent AF with RVR, on OAC, there is not good ventricular rate control as heart rates are 110-140 bpm.  Sent to triage. Next remote 91 days. Kathy Breach, RN, CCDS, [employer]  ECHOCARDIOGRAM LIMITED  Result Date: 11/21/2019    ECHOCARDIOGRAM LIMITED REPORT   Patient Name:   AILYNE PAWLEY Date of Exam: 11/21/2019 Medical Rec #:  [id number]      Height:       64.0 in Accession #:    [id number]     Weight:       170.6 lb Date of Birth:  1930/01/23      BSA:          1.829 m Patient Age:    61 years       BP:           120/80 mmHg Patient Gender: F              HR:           111 bpm. Exam Location:  [address] Procedure: Limited Echo, Cardiac Doppler and Limited Color Doppler Indications:    I31.3 Pericardial effusion. LIMITED to follow up pericardial                 effusion.  History:        Patient has prior history of Echocardiogram examinations, most                 recent 11/03/2019. CHF, CAD and Previous Myocardial Infarction,                 Defibrillator, Aortic Valve Disease, Arrythmias:Atrial                 Fibrillation and VT. Bradycardia.; Risk Factors:Diabetes,                 Hypertension, Dyslipidemia and Former Smoker. Breast cancer.                 Anemia.  Sonographer:    Jessee Avers, RDCS Referring Phys: [place]  1. Mild global hypokinesis slightly worse in the septum. Left ventricular ejection fraction, by estimation, is 45 to 50%. The left ventricle has mildly decreased function. The left ventricle demonstrates global hypokinesis.   2. Right ventricular systolic function is normal. The right ventricular size is normal. There is mildly elevated pulmonary artery systolic pressure.  3. Left atrial size was severely dilated.  4. A small pericardial effusion is present. The pericardial effusion is posterior to the left ventricle.  5. Severe mitral regurgitation with Coanda effect and pulmonary vein flow reversal. The mitral valve is normal in structure. Severe mitral valve regurgitation. No evidence of mitral stenosis.  6. Tricuspid valve regurgitation is moderate.  7. Aortic valve leaflets are restricted. . The aortic valve is normal in structure. There is moderate calcification of the aortic valve. There is moderate thickening of the aortic valve. Aortic valve regurgitation is mild. Mild aortic valve stenosis. Aortic valve area, by VTI measures 1.38 cm. Aortic valve mean gradient measures 9.8 mmHg. Aortic valve Vmax measures 2.14 m/s.  8. Aortic dilatation noted. There is mild dilatation of the ascending aorta, measuring 38 mm.  9. The inferior vena cava is normal in size with greater than  50% respiratory variability, suggesting right atrial pressure of 3 mmHg. Comparison(s): TEE 11/03/19 EF 50%. Severe MR. Moderate pericardial effusion. TTE 10/22/19 EF 45-50%. Severe MR. Small pericardial effusion. FINDINGS  Left Ventricle: Mild global hypokinesis slightly worse in the septum. Left ventricular ejection fraction, by estimation, is 45 to 50%. The left ventricle has mildly decreased function. The left ventricle demonstrates global hypokinesis. The left ventricular internal cavity size was normal in size. There is no left ventricular hypertrophy. Right Ventricle: The right ventricular size is normal. No increase in right ventricular wall thickness. Right ventricular systolic function is normal. There is mildly elevated pulmonary artery systolic pressure. The tricuspid regurgitant velocity is 3.20  m/s, and with an assumed right atrial pressure of 3  mmHg, the estimated right ventricular systolic pressure is 13.2 mmHg. Left Atrium: Left atrial size was severely dilated. Right Atrium: Right atrial size was normal in size. Pericardium: A small pericardial effusion is present. The pericardial effusion is posterior to the left ventricle. Mitral Valve: Severe mitral regurgitation with Coanda effect and pulmonary vein flow reversal. The mitral valve is normal in structure. Severe mitral valve regurgitation, with posteriorly-directed jet. No evidence of mitral valve stenosis. Tricuspid Valve: The tricuspid valve is normal in structure. Tricuspid valve regurgitation is moderate . No evidence of tricuspid stenosis. Aortic Valve: Aortic valve leaflets are restricted. The aortic valve is normal in structure. There is moderate calcification of the aortic valve. There is moderate thickening of the aortic valve. Aortic valve regurgitation is mild. Aortic regurgitation PHT measures 280 msec. Mild aortic stenosis is present. Aortic valve mean gradient measures 9.8 mmHg. Aortic valve peak gradient measures 18.4 mmHg. Aortic valve area, by VTI measures 1.38 cm. Pulmonic Valve: The pulmonic valve was normal in structure. Pulmonic valve regurgitation is not visualized. No evidence of pulmonic stenosis. Aorta: Aortic dilatation noted. There is mild dilatation of the ascending aorta, measuring 38 mm. Venous: The inferior vena cava is normal in size with greater than 50% respiratory variability, suggesting right atrial pressure of 3 mmHg. IAS/Shunts: No atrial level shunt detected by color flow Doppler. LEFT VENTRICLE PLAX 2D LVIDd:         4.50 cm LVIDs:         3.50 cm LV PW:         1.20 cm LV IVS:        1.00 cm LVOT diam:     2.10 cm LV SV:         48 LV SV Index:   26 LVOT Area:     3.46 cm  RIGHT VENTRICLE RVSP:           49.0 mmHg LEFT ATRIUM              Index       RIGHT ATRIUM LA diam:        4.30 cm  2.35 cm/m  RA Pressure: 8.00 mmHg LA Vol (A2C):   107.0 ml 58.52 ml/m  LA Vol (A4C):   96.6 ml  52.83 ml/m LA Biplane Vol: 104.0 ml 56.88 ml/m  AORTIC VALVE AV Area (Vmax):    1.37 cm AV Area (Vmean):   1.52 cm AV Area (VTI):     1.38 cm AV Vmax:           214.50 cm/s AV Vmean:          143.250 cm/s AV VTI:            0.349 m AV Peak Grad:  18.4 mmHg AV Mean Grad:      9.8 mmHg LVOT Vmax:         85.00 cm/s LVOT Vmean:        62.800 cm/s LVOT VTI:          0.139 m LVOT/AV VTI ratio: 0.40 AI PHT:            280 msec  AORTA Ao Root diam: 3.50 cm Ao Asc diam:  3.80 cm MR Peak grad:    136.3 mmHg  TRICUSPID VALVE MR Mean grad:    82.6 mmHg   TR Peak grad:   41.0 mmHg MR Vmax:         583.80 cm/s TR Vmax:        320.00 cm/s MR Vmean:        424.2 cm/s  Estimated RAP:  8.00 mmHg MR PISA:         1.57 cm    RVSP:           49.0 mmHg MR PISA Eff ROA: 10 mm MR PISA Radius:  0.50 cm     SHUNTS                              Systemic VTI:  0.14 m                              Systemic Diam: 2.10 cm Skeet Latch MD Electronically signed by Skeet Latch MD Signature Date/Time: 11/21/2019/4:34:50 PM    Final    [place] EKG SITE RITE  Result Date: 11/05/2019 If Site Rite image not attached, placement could not be confirmed due to current cardiac rhythm.  IR THORACENTESIS ASP PLEURAL SPACE W/IMG GUIDE  Result Date: 11/08/2019 INDICATION: Patient with a history of chronic heart failure and recurrent pleural effusions presents today for a therapeutic thoracentesis. EXAM: ULTRASOUND GUIDED THORACENTESIS MEDICATIONS: 1% lidocaine 10 mL COMPLICATIONS: None immediate. PROCEDURE: An ultrasound guided thoracentesis was thoroughly discussed with the patient and questions answered. The benefits, risks, alternatives and complications were also discussed. The patient understands and wishes to proceed with the procedure. Written consent was obtained. Ultrasound was performed to localize and mark an adequate pocket of fluid in the left chest. The area was then prepped and draped in the normal  sterile fashion. 1% Lidocaine was used for local anesthesia. Under ultrasound guidance a 6 Fr Safe-T-Centesis catheter was introduced. Thoracentesis was performed. The catheter was removed and a dressing applied. FINDINGS: A total of approximately 800 mL of light red fluid was removed. IMPRESSION: Successful ultrasound guided left thoracentesis yielding 800 mL of pleural fluid. Read by: Soyla Dryer, NP Electronically Signed   By: Ruthann Cancer MD   On: 11/08/2019 11:59   Disposition   Patient was seen and examined by Dr. Oval Linsey who deemed patient as stable for discharge. Follow-up has been arranged. Discharge medications as listed below.   Follow-up Plans & Appointments     Follow-up Information    Park Liter, MD Follow up.   Specialty: Cardiology Contact information: [place] [address] [phone]              Discharge Instructions    Diet general   Complete by: As directed    Increase activity slowly   Complete by: As directed       Discharge Medications   Allergies as of 11/29/2019  Reactions   Amiodarone Other (See Comments)   Prolonged QT, VT   Aranesp (albumin Free) [darbepoetin Alfa] Other (See Comments)   Patient had cardiac arrest evening after receiving this   Tape Itching, Rash, Other (See Comments)   Reaction was from the surgical tape from cyst removed   Latex Itching      Medication List    STOP taking these medications   anastrozole 1 MG tablet Commonly known as: ARIMIDEX   apixaban 2.5 MG Tabs tablet Commonly known as: Eliquis   atorvastatin 40 MG tablet Commonly known as: LIPITOR   b complex vitamins capsule   Biotin 1 MG Caps   Iron 325 (65 Fe) MG Tabs   isosorbide dinitrate 20 MG tablet Commonly known as: ISORDIL   levothyroxine 175 MCG tablet Commonly known as: Synthroid   metoprolol succinate 50 MG 24 hr tablet Commonly known as: TOPROL-XL   nitroGLYCERIN 0.4 MG SL tablet Commonly known  as: NITROSTAT   potassium chloride SA 20 MEQ tablet Commonly known as: KLOR-CON   torsemide 20 MG tablet Commonly known as: DEMADEX   triamcinolone ointment 0.1 % Commonly known as: KENALOG   vitamin C 500 MG tablet Commonly known as: ASCORBIC ACID     TAKE these medications   acetaminophen 650 MG CR tablet Commonly known as: TYLENOL Take 1,300 mg by mouth every 8 (eight) hours as needed for pain.   amiodarone 200 MG tablet Commonly known as: PACERONE Take 1 tablet (200 mg total) by mouth 2 (two) times daily.   fluticasone 50 MCG/ACT nasal spray Commonly known as: FLONASE Place 1 spray into both nostrils in the morning and at bedtime.   furosemide 80 MG tablet Commonly known as: LASIX Take 1 tablet (80 mg total) by mouth 2 (two) times daily.   glycopyrrolate 0.2 MG/ML injection Commonly known as: ROBINUL Inject 1 mL (0.2 mg total) into the vein every 4 (four) hours as needed (secretions).   HYDROmorphone 1 MG/ML injection Commonly known as: DILAUDID Inject 0.25 mLs (0.25 mg total) into the vein every 8 (eight) hours.   HYDROmorphone 1 MG/ML injection Commonly known as: DILAUDID Inject 0.5 mLs (0.5 mg total) into the vein every 3 (three) hours as needed for severe pain (dyspnea/air hunger/tachypnea).   LORazepam 1 MG tablet Commonly known as: Ativan Take 1 tablet (1 mg total) by mouth 2 (two) times daily. What changed:   when to take this  reasons to take this   LORazepam 2 MG/ML injection Commonly known as: ATIVAN Inject 0.25 mLs (0.5 mg total) into the vein every 6 (six) hours as needed for anxiety. What changed: You were already taking a medication with the same name, and this prescription was added. Make sure you understand how and when to take each.   melatonin 5 MG Tabs Take 1 tablet (5 mg total) by mouth at bedtime as needed (sleep).   ondansetron 4 MG/2ML Soln injection Commonly known as: ZOFRAN Inject 2 mLs (4 mg total) into the vein every 6  (six) hours as needed for nausea.   oxyCODONE 5 MG immediate release tablet Commonly known as: Oxy IR/ROXICODONE Take 1 tablet (5 mg total) by mouth every 4 (four) hours as needed for moderate pain (dyspnea/air hunger/tachypnea).   polymixin-bacitracin 500-10000 UNIT/GM Oint ointment Apply 1 application topically 2 (two) times daily. Apply to underneath pannis           Outstanding Labs/Studies   None  Duration of Discharge Encounter   Greater than 30  minutes including physician time.  Signed, Abigail Butts PA-C 11/29/2019, 2:18 PM

## 2019-11-29 NOTE — Progress Notes (Signed)
[facility] Liaison note.   Received request from TOC manager for family interest in [facility] with request for transfer today. Chart reviewed and eligibility confirmed.  Spoke to patient and family to confirm interest and explain services. Family agreeable to transfer today. CSW aware.  Registration paperwork will need to be completed. Dr. Donald Hertweck to assume care per family.    RN please call report to [phone] once consents are signed for [facility] and please arrange transport for patient.  Thank you,   Melissa O'Bryant, BSN, RN    [facility] Hospital Liaison (listed on [facility] under [facility])   [phone]      

## 2019-11-29 NOTE — Discharge Instructions (Signed)
It was a pleasure being apart of your care. Wishing you peace.

## 2019-11-29 NOTE — Plan of Care (Signed)
  Problem: Education: Goal: Knowledge of General Education information will improve Description: Including pain rating scale, medication(s)/side effects and non-pharmacologic comfort measures Outcome: Adequate for Discharge   Problem: Health Behavior/Discharge Planning: Goal: Ability to manage health-related needs will improve Outcome: Adequate for Discharge   Problem: Clinical Measurements: Goal: Ability to maintain clinical measurements within normal limits will improve Outcome: Adequate for Discharge Goal: Will remain free from infection Outcome: Adequate for Discharge Goal: Diagnostic test results will improve Outcome: Adequate for Discharge Goal: Respiratory complications will improve Outcome: Adequate for Discharge Goal: Cardiovascular complication will be avoided Outcome: Adequate for Discharge   Problem: Activity: Goal: Risk for activity intolerance will decrease Outcome: Adequate for Discharge   Problem: Nutrition: Goal: Adequate nutrition will be maintained Outcome: Adequate for Discharge   Problem: Coping: Goal: Level of anxiety will decrease Outcome: Adequate for Discharge   Problem: Elimination: Goal: Will not experience complications related to bowel motility Outcome: Adequate for Discharge Goal: Will not experience complications related to urinary retention Outcome: Adequate for Discharge   Problem: Pain Managment: Goal: General experience of comfort will improve Outcome: Adequate for Discharge   Problem: Safety: Goal: Ability to remain free from injury will improve Outcome: Adequate for Discharge   Problem: Skin Integrity: Goal: Risk for impaired skin integrity will decrease Outcome: Adequate for Discharge   Problem: Education: Goal: Knowledge of the prescribed therapeutic regimen will improve Outcome: Adequate for Discharge   Problem: Coping: Goal: Ability to identify and develop effective coping behavior will improve Outcome: Adequate for  Discharge   Problem: Clinical Measurements: Goal: Quality of life will improve Outcome: Adequate for Discharge   Problem: Respiratory: Goal: Verbalizations of increased ease of respirations will increase Outcome: Adequate for Discharge   Problem: Role Relationship: Goal: Family's ability to cope with current situation will improve Outcome: Adequate for Discharge Goal: Ability to verbalize concerns, feelings, and thoughts to partner or family member will improve Outcome: Adequate for Discharge   Problem: Pain Management: Goal: Satisfaction with pain management regimen will improve Outcome: Adequate for Discharge   

## 2019-11-29 NOTE — Care Management (Addendum)
Received a secure chat from [facility] with [facility]. Patient has a bed and can transfer today once consents signed. NCM notified family at bedside, bedside nurse and Hato Candal PA.    Once consents completed NCM will arrange [facility] transport.   1345 Received message consents have been signed and [employer] ready for transport. Nurse requesting 2:30 pick with [facility]. [facility] called for 1430 paperwork in shadow chart.  RN please call report to [place] RN

## 2019-11-29 NOTE — Care Management Important Message (Signed)
Important Message  Patient Details  Name: Lauren Walker MRN: [id number] Date of Birth: 11/02/1929   Medicare Important Message Given:  No  Patient at end of life out of respect no IM document given.   Monette Omara 11/29/2019, 12:37 PM

## 2019-12-03 ENCOUNTER — Other Ambulatory Visit: Payer: Self-pay | Admitting: Family Medicine

## 2019-12-05 NOTE — Telephone Encounter (Signed)
Please advise if she should still be on this?

## 2019-12-06 ENCOUNTER — Ambulatory Visit: Payer: Medicare Other | Admitting: Podiatry

## 2019-12-07 ENCOUNTER — Telehealth: Payer: Self-pay

## 2019-12-07 NOTE — Telephone Encounter (Signed)
Pt husband called stating she passed away 12-18-19. I cancelled her appointments and marked her expired in [facility].

## 2019-12-12 ENCOUNTER — Ambulatory Visit: Payer: Medicare Other | Admitting: Cardiovascular Disease

## 2019-12-12 DIAGNOSIS — I2510 Atherosclerotic heart disease of native coronary artery without angina pectoris: Secondary | ICD-10-CM | POA: Insufficient documentation

## 2019-12-12 DIAGNOSIS — I10 Essential (primary) hypertension: Secondary | ICD-10-CM | POA: Insufficient documentation

## 2019-12-12 DIAGNOSIS — N189 Chronic kidney disease, unspecified: Secondary | ICD-10-CM | POA: Insufficient documentation

## 2019-12-12 DIAGNOSIS — I219 Acute myocardial infarction, unspecified: Secondary | ICD-10-CM | POA: Insufficient documentation

## 2019-12-12 DIAGNOSIS — E079 Disorder of thyroid, unspecified: Secondary | ICD-10-CM | POA: Insufficient documentation

## 2019-12-12 DIAGNOSIS — E119 Type 2 diabetes mellitus without complications: Secondary | ICD-10-CM | POA: Insufficient documentation

## 2019-12-12 DIAGNOSIS — I519 Heart disease, unspecified: Secondary | ICD-10-CM | POA: Insufficient documentation

## 2019-12-12 DIAGNOSIS — K5792 Diverticulitis of intestine, part unspecified, without perforation or abscess without bleeding: Secondary | ICD-10-CM | POA: Insufficient documentation

## 2019-12-12 DIAGNOSIS — E039 Hypothyroidism, unspecified: Secondary | ICD-10-CM | POA: Insufficient documentation

## 2019-12-12 DIAGNOSIS — M1990 Unspecified osteoarthritis, unspecified site: Secondary | ICD-10-CM | POA: Insufficient documentation

## 2019-12-16 ENCOUNTER — Ambulatory Visit: Payer: Medicare Other | Admitting: Cardiology

## 2019-12-19 DEATH — deceased

## 2019-12-27 ENCOUNTER — Ambulatory Visit: Payer: Medicare Other | Admitting: Family Medicine

## 2020-02-01 ENCOUNTER — Ambulatory Visit: Payer: Medicare Other | Admitting: Cardiology

## 2020-04-10 IMAGING — MG MM DIGITAL DIAGNOSTIC UNILAT*L* W/ TOMO W/ CAD
4 series · 4 of 12 positions shown · non-contrast
Comparison: 01/10/2019 from [HOSPITAL] [REDACTED] and
previous outside studies 4137, 1882, and 2430 from [facility] and [REDACTED]

CLINICAL DATA: Patient returns after screening study for evaluation
of possible LEFT breast mass.

EXAM:
DIGITAL DIAGNOSTIC LEFT MAMMOGRAM WITH CAD AND TOMO
ULTRASOUND LEFT BREAST

[L CC synth-2D]
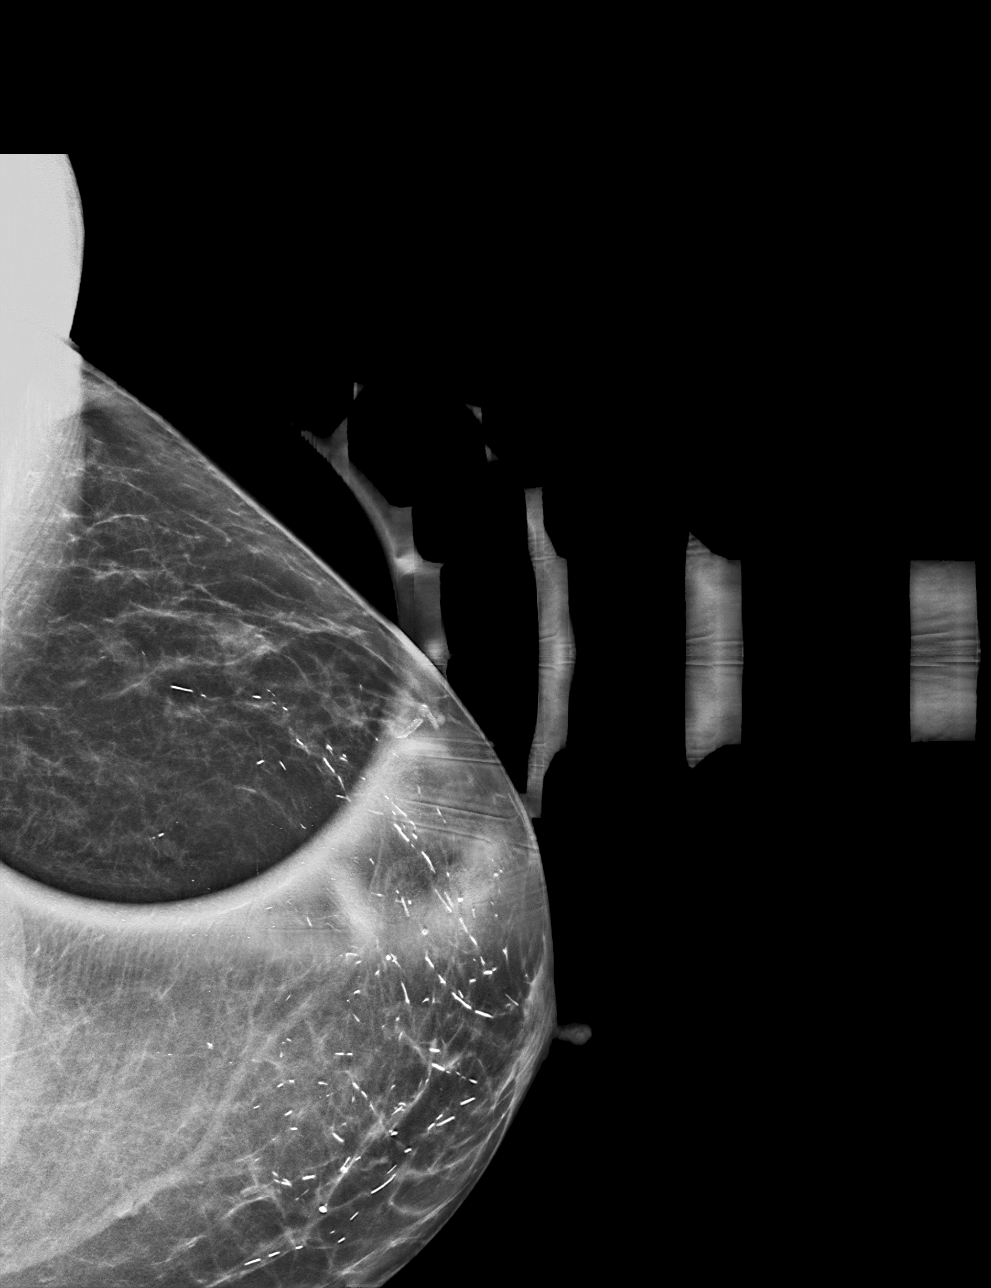

[L MLO synth-2D]
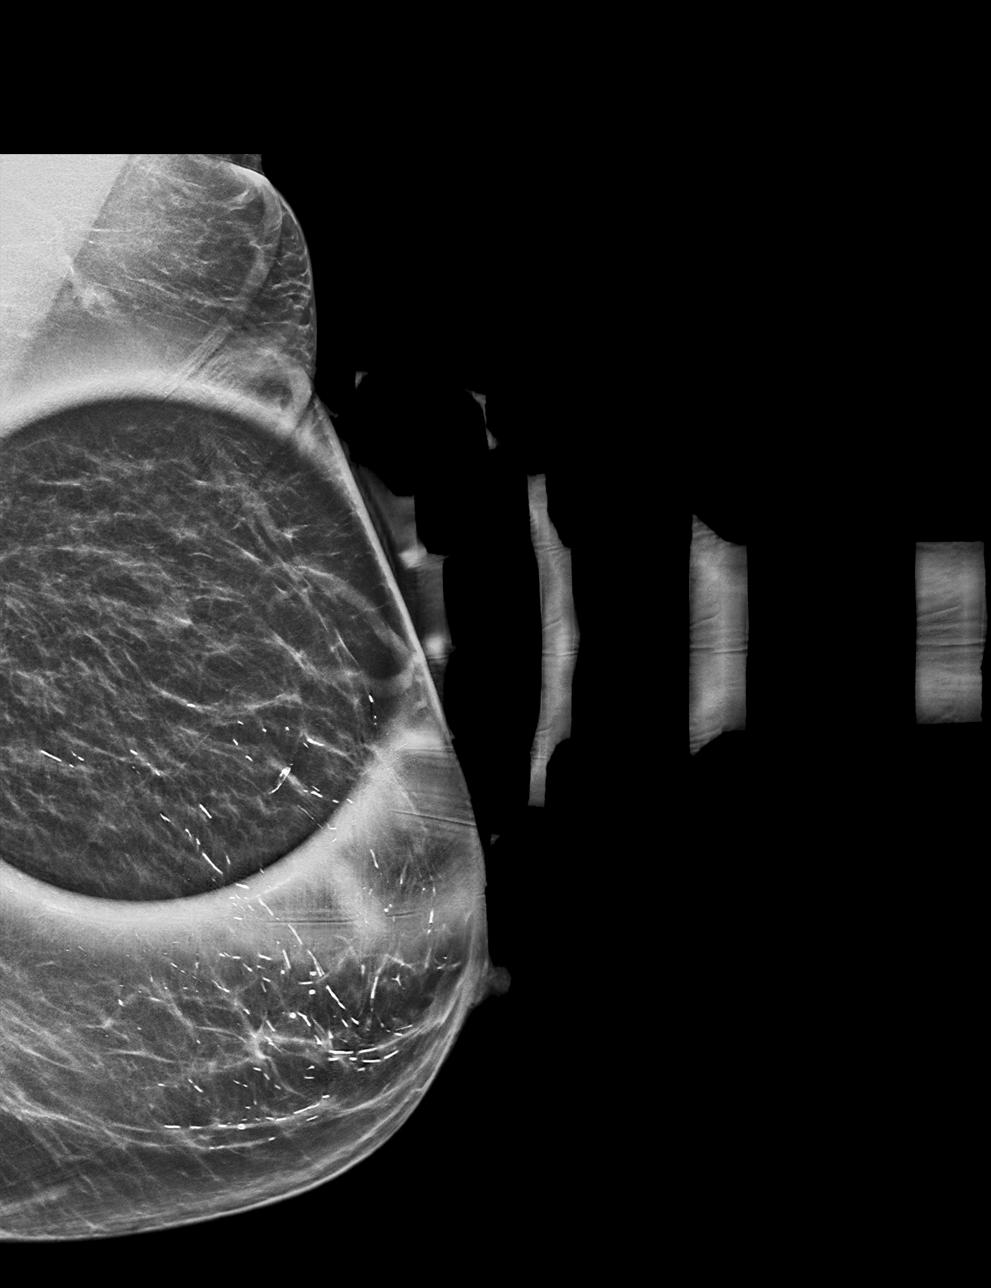

[L CC tomo · tomo slice 25/50.0]
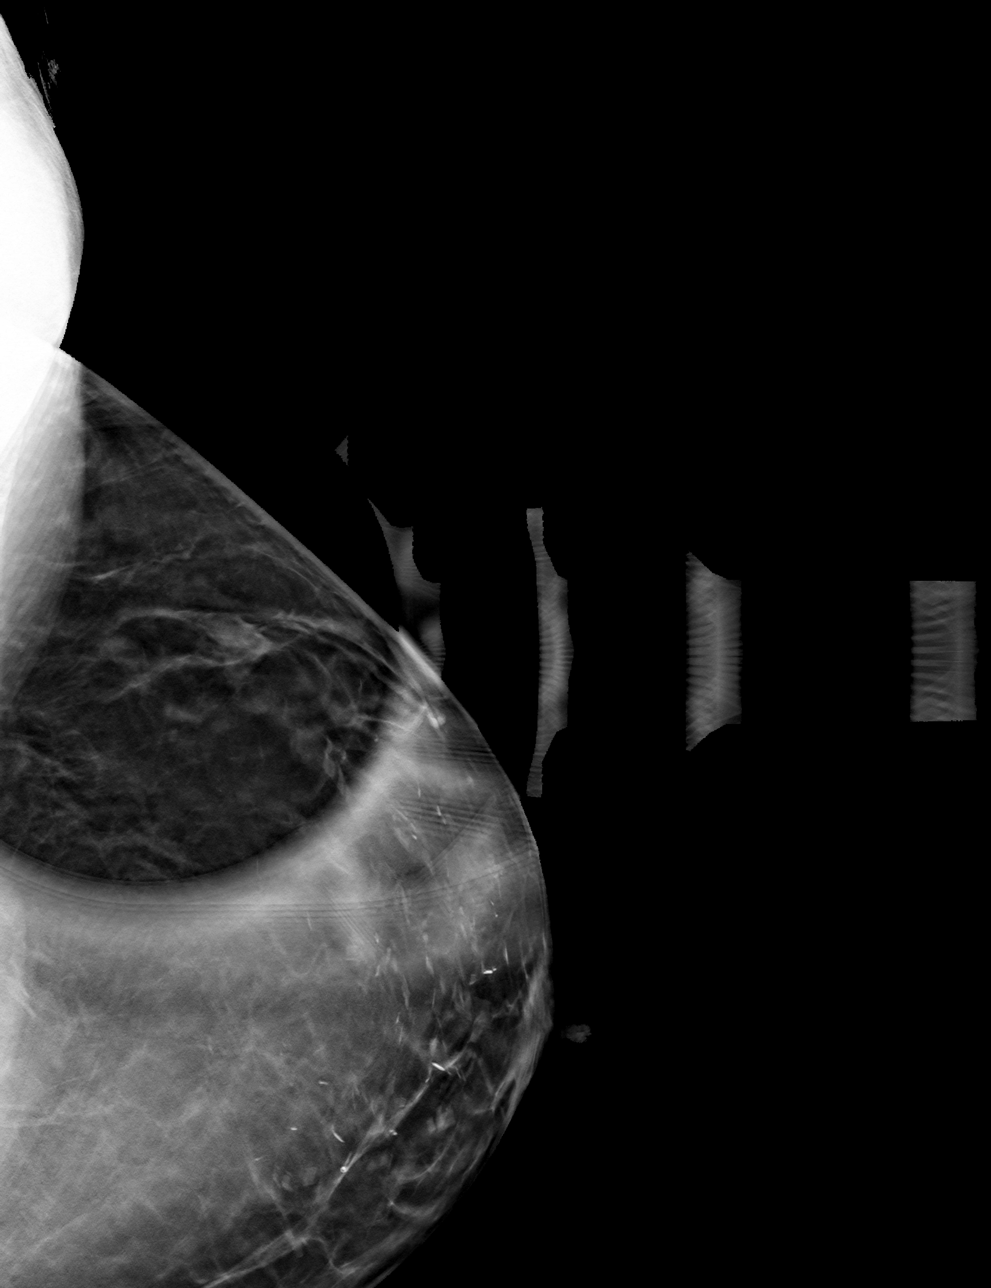

[L MLO tomo · tomo slice 27/54.0]
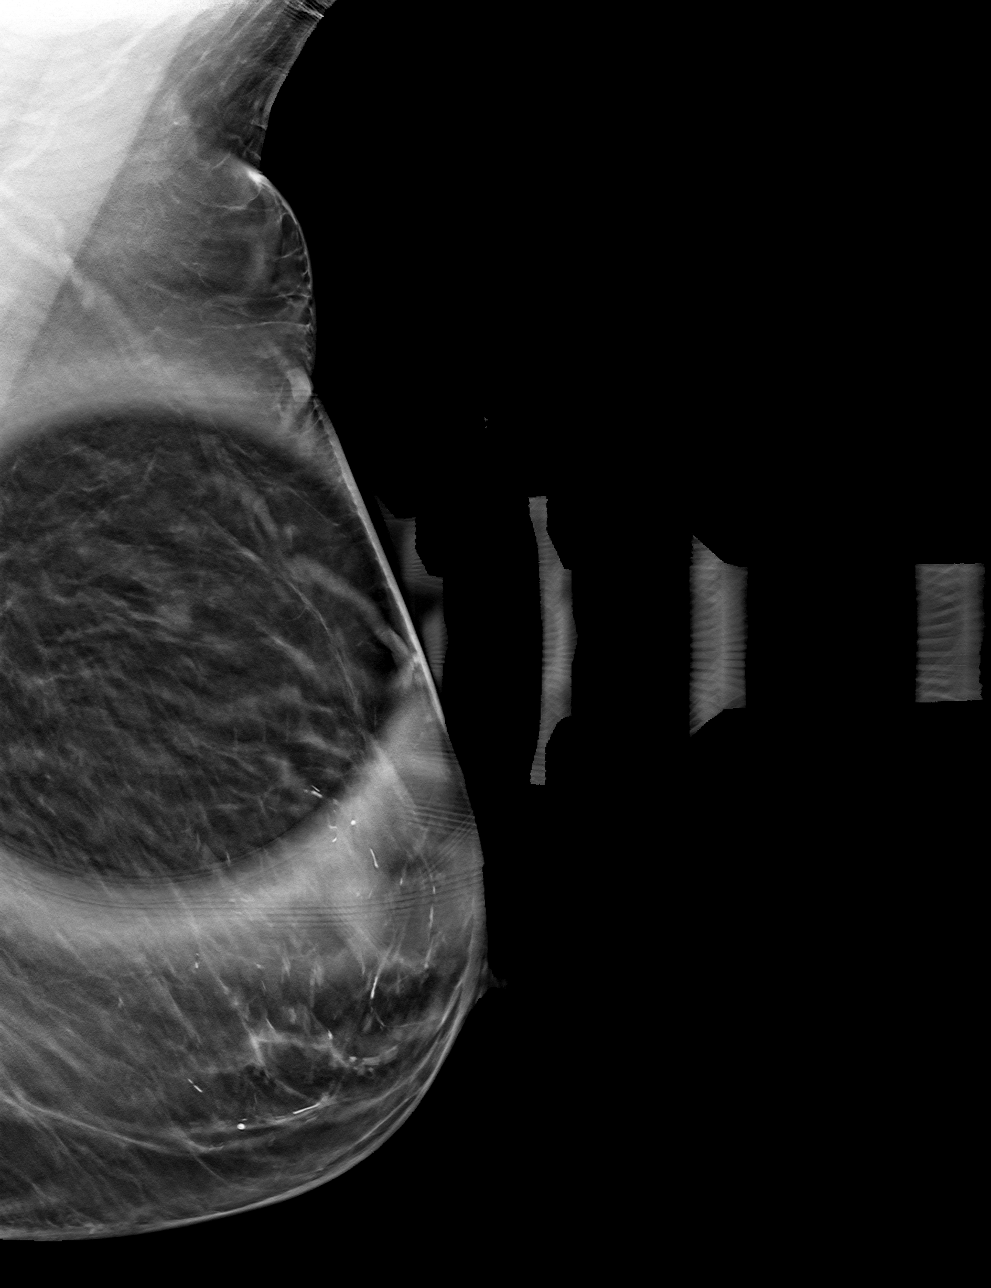

[4 of 12 positions shown; findings below may reference images not displayed]

ACR Breast Density Category b: There are scattered areas of
fibroglandular density.
FINDINGS: Additional 2-D and 3-D images are performed. These views confirm
presence of an irregular mass with irregular margins in the LATERAL
portion of the LEFT breast. An adjacent tubular mass is also
identified.

Mammographic images were processed with CAD.

On physical exam, I palpate no abnormality in the LATERAL portion of
the LEFT breast.

Targeted ultrasound is performed, showing irregular mixed
echogenicity mass in the 2 o'clock location of the LEFT breast 8
centimeters from the nipple. Mass measures 1.1 x 0.7 x
centimeters. Internal blood flow is identified on Doppler
evaluation. There are 2 adjacent oval circumscribed parallel masses.
These measure 1.0 x 0.3 x 1.0 centimeters and 1.0 x 0.4 x
centimeters. Internal blood flow is identified within these lesions.
Measured together the 3 lesions are approximately 2 centimeters.

Evaluation of the LEFT axilla is negative for adenopathy.
IMPRESSION: Indeterminate mass in the 2 o'clock location of the LEFT breast
which biopsy is recommended.

Negative LEFT axilla.

RECOMMENDATION:
Ultrasound-guided core biopsy of LEFT breast mass.

I have discussed the findings and recommendations with the patient.
If applicable, a reminder letter will be sent to the patient
regarding the next appointment.

BI-RADS CATEGORY  4: Suspicious.

## 2020-10-21 IMAGING — MG MM DIGITAL DIAGNOSTIC UNILAT*L* W/ TOMO W/ CAD
4 series · 4 of 12 positions shown · non-contrast
Comparison: Previous exam(s).

CLINICAL DATA: 89-year-old female with known, biopsy proven left
breast cancer. The patient is currently undergoing oral chemotherapy
and has not had surgery.

EXAM:
DIGITAL DIAGNOSTIC LEFT MAMMOGRAM WITH CAD AND TOMO
ULTRASOUND LEFT BREAST

[L CC synth-2D]
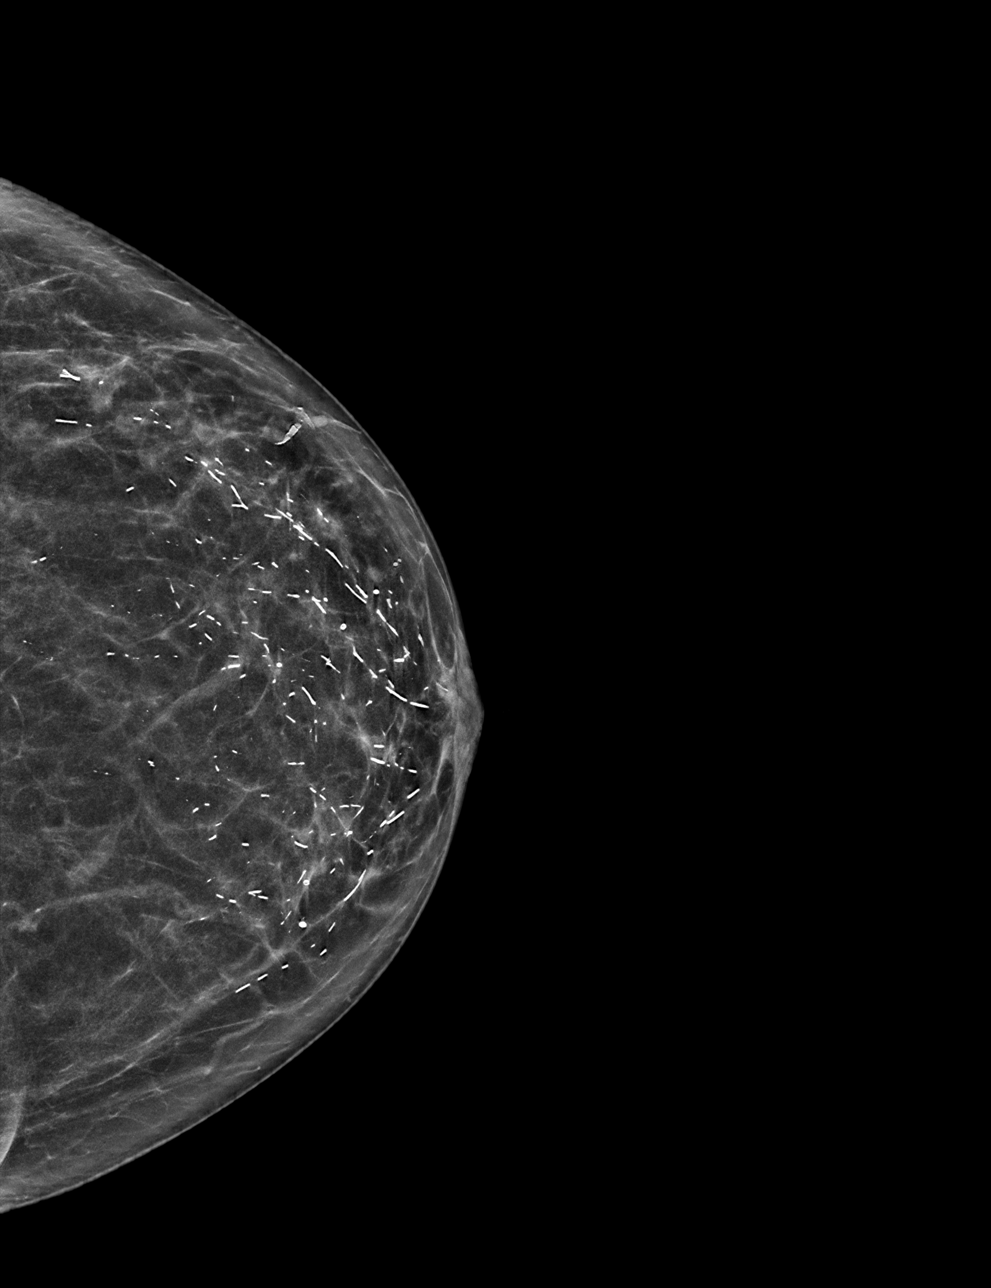

[L MLO synth-2D]
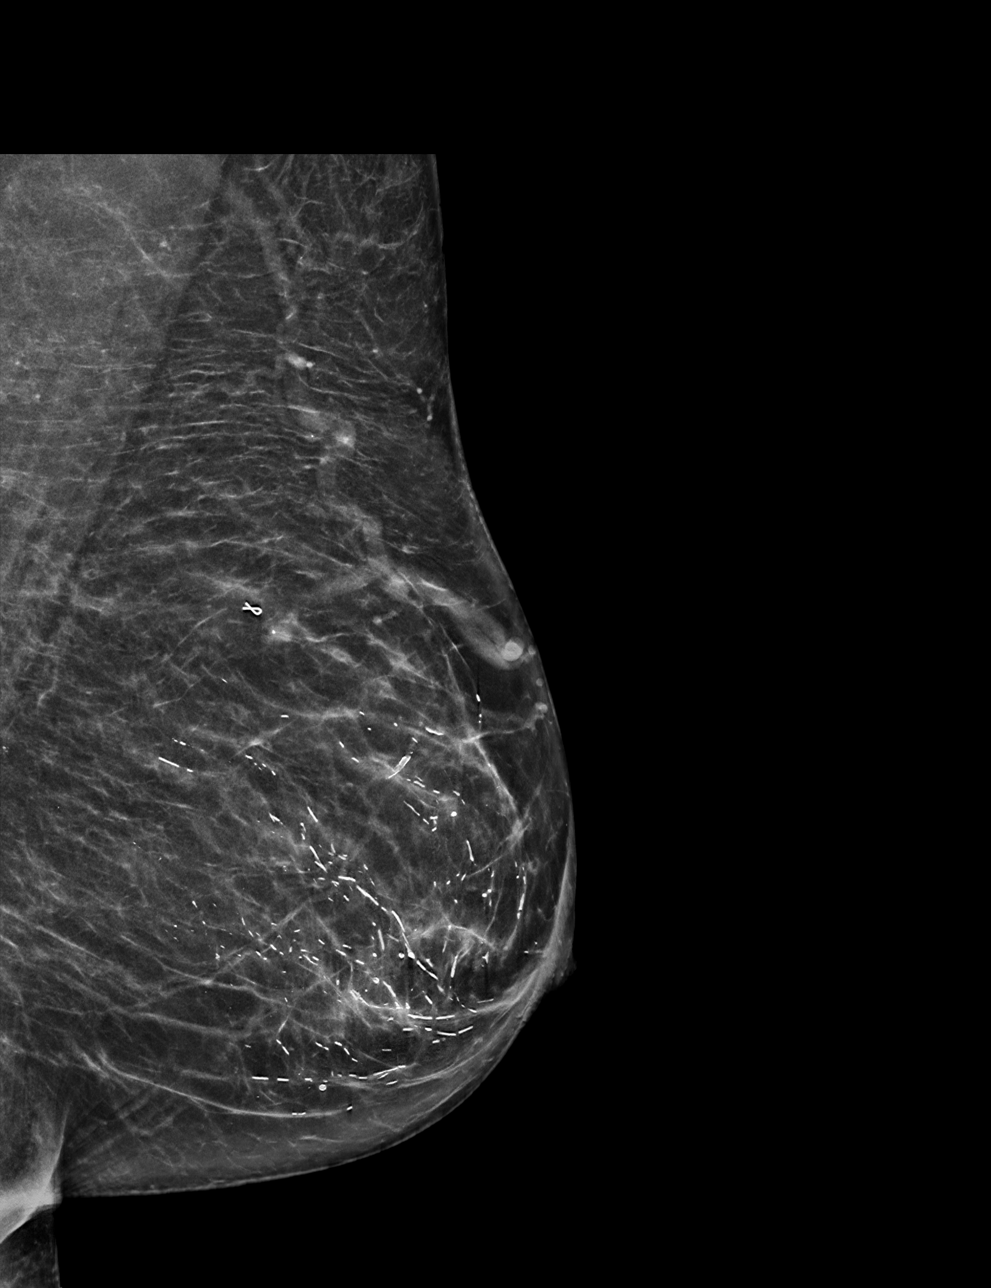

[L MLO tomo · tomo slice 33/65.0]
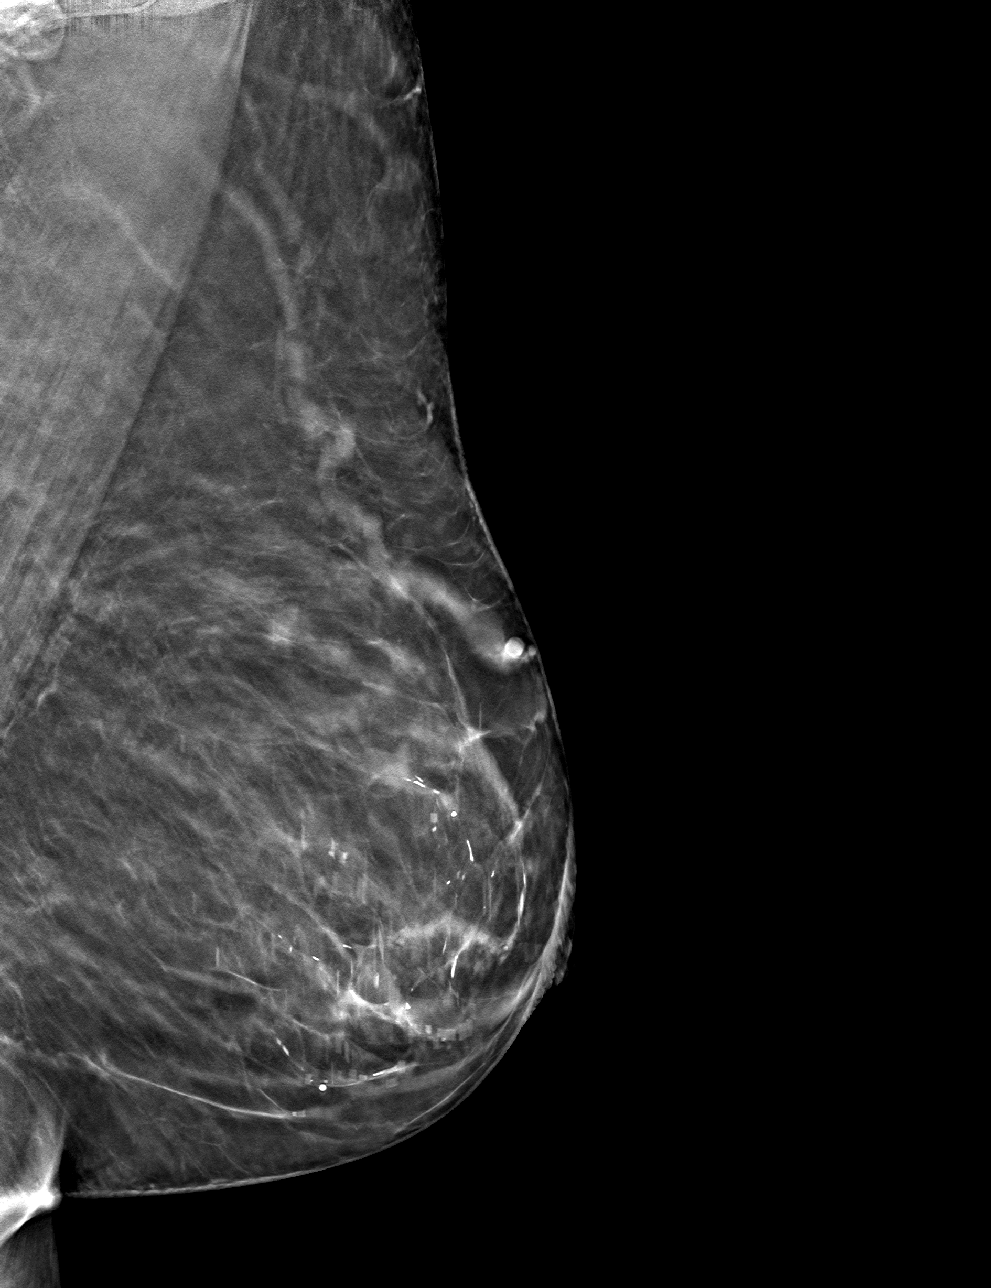

[L CC tomo · tomo slice 27/53.0]
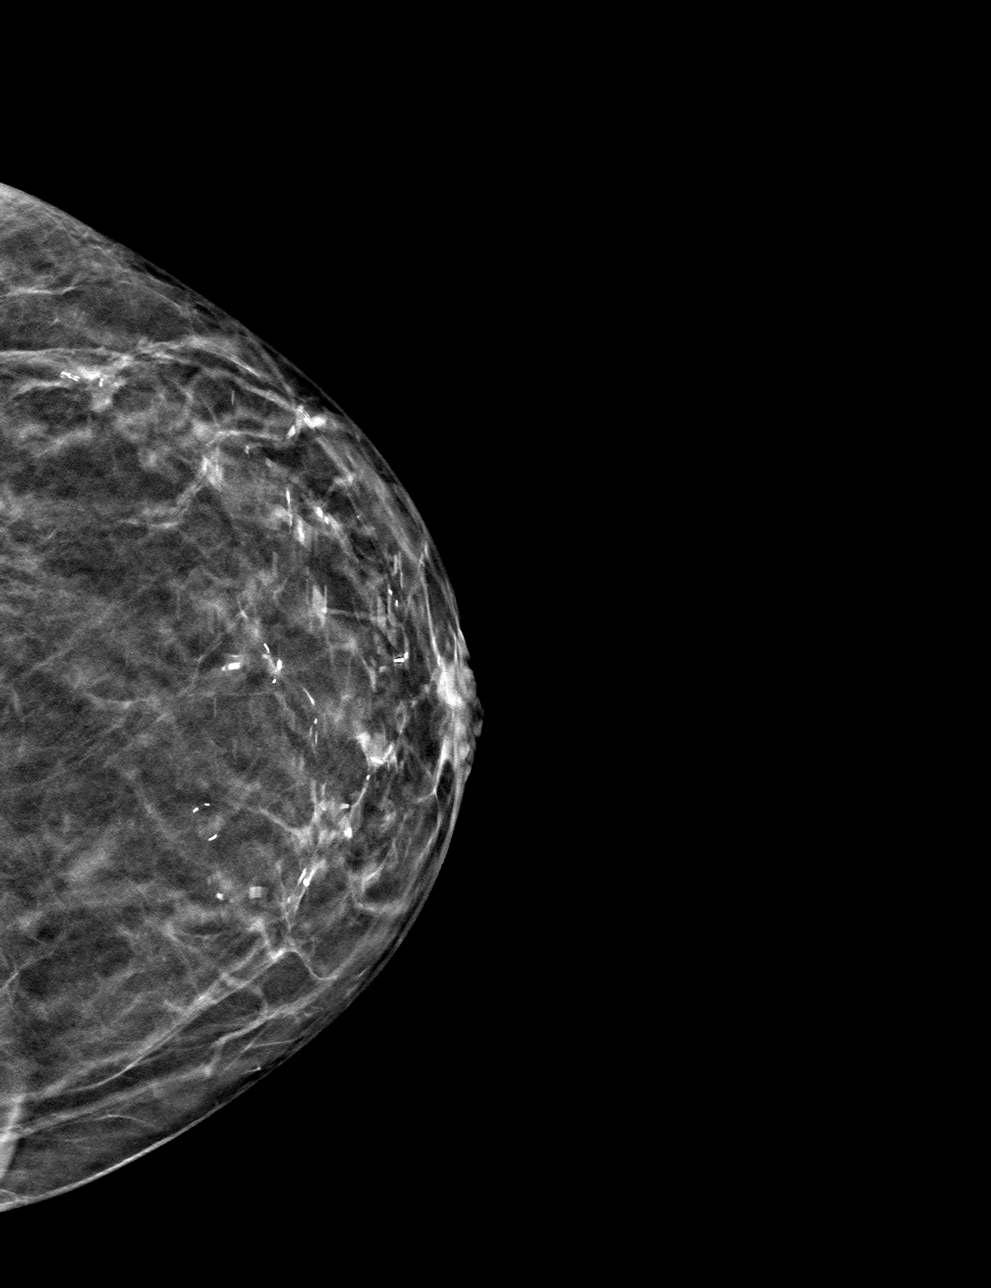

[4 of 12 positions shown; findings below may reference images not displayed]

ACR Breast Density Category b: There are scattered areas of
fibroglandular density.
FINDINGS: Targeted ultrasound is performed, showing stable to slight interval
decrease in size of a irregular, hypoechoic mass at the [DATE]
position 8 cm from the nipple. It measures 6 x 6 x 4 mm (previously
8 x 7 x 7 mm). Two additional circumscribed masses at the 2 o'clock
position 8 cm from the nipple measure 5 x 3 10 mm and 8 x 3 x 10 mm
(previously 10 x 3 x 10 mm and 10 x 4 x 10 mm). A post biopsy clip
is seen in the vicinity of the masses.

Additional two view mammographic images of the left breast were
obtained. Post biopsy clip is seen in association with a spiculated
mass in the upper outer left breast at mid to posterior depth. No
additional suspicious findings are identified in the remainder of
the left breast.

Mammographic images were processed with CAD.
IMPRESSION: Stable to slight interval decrease in size of the patient's known
left breast cancer. No new or suspicious findings identified.

RECOMMENDATION:
Per clinical treatment plan.

The patient is due for right breast screening in December 2019.

I have discussed the findings and recommendations with the patient.
If applicable, a reminder letter will be sent to the patient
regarding the next appointment.

BI-RADS CATEGORY  6: Known biopsy-proven malignancy.
